# Patient Record
Sex: Female | Born: 1955
Health system: Southern US, Community
[De-identification: ages and names within clinical notes are randomized; demographics above are authoritative.]

## PROBLEM LIST (undated history)

## (undated) DIAGNOSIS — J0101 Acute recurrent maxillary sinusitis: Principal | ICD-10-CM

## (undated) DIAGNOSIS — C880 Waldenstrom macroglobulinemia: Secondary | ICD-10-CM

## (undated) DIAGNOSIS — D801 Nonfamilial hypogammaglobulinemia: Secondary | ICD-10-CM

## (undated) HISTORY — DX: Nonfamilial hypogammaglobulinemia: D80.1

## (undated) HISTORY — DX: Acute recurrent maxillary sinusitis: J01.01

## (undated) HISTORY — DX: Waldenstrom macroglobulinemia: C88.0

---

## 1998-12-15 ENCOUNTER — Other Ambulatory Visit: Admission: RE | Admit: 1998-12-15 | Discharge: 1998-12-15 | Payer: Self-pay | Admitting: *Deleted

## 1999-01-26 ENCOUNTER — Encounter: Payer: Self-pay | Admitting: *Deleted

## 1999-01-26 ENCOUNTER — Ambulatory Visit (HOSPITAL_COMMUNITY): Admission: RE | Admit: 1999-01-26 | Discharge: 1999-01-26 | Payer: Self-pay | Admitting: *Deleted

## 2002-06-26 ENCOUNTER — Other Ambulatory Visit: Admission: RE | Admit: 2002-06-26 | Discharge: 2002-06-26 | Payer: Self-pay | Admitting: Obstetrics and Gynecology

## 2003-08-05 ENCOUNTER — Other Ambulatory Visit: Admission: RE | Admit: 2003-08-05 | Discharge: 2003-08-05 | Payer: Self-pay | Admitting: *Deleted

## 2012-02-07 ENCOUNTER — Encounter: Payer: Self-pay | Admitting: Family Medicine

## 2012-02-07 ENCOUNTER — Ambulatory Visit (INDEPENDENT_AMBULATORY_CARE_PROVIDER_SITE_OTHER): Payer: 59 | Admitting: Family Medicine

## 2012-02-07 ENCOUNTER — Telehealth: Payer: Self-pay | Admitting: Family Medicine

## 2012-02-07 VITALS — BP 110/75 | HR 109 | Temp 97.8°F | Ht 60.0 in | Wt 104.0 lb

## 2012-02-07 DIAGNOSIS — B9689 Other specified bacterial agents as the cause of diseases classified elsewhere: Secondary | ICD-10-CM

## 2012-02-07 DIAGNOSIS — J329 Chronic sinusitis, unspecified: Secondary | ICD-10-CM

## 2012-02-07 DIAGNOSIS — C8800 Waldenstrom macroglobulinemia not having achieved remission: Secondary | ICD-10-CM | POA: Insufficient documentation

## 2012-02-07 DIAGNOSIS — D649 Anemia, unspecified: Secondary | ICD-10-CM

## 2012-02-07 DIAGNOSIS — A499 Bacterial infection, unspecified: Secondary | ICD-10-CM

## 2012-02-07 HISTORY — DX: Waldenstrom macroglobulinemia not having achieved remission: C88.00

## 2012-02-07 HISTORY — DX: Waldenstrom macroglobulinemia: C88.0

## 2012-02-07 LAB — POCT HEMOGLOBIN: Hemoglobin: 9.2 g/dL — AB (ref 12.2–16.2)

## 2012-02-07 MED ORDER — AMOXICILLIN-POT CLAVULANATE 500-125 MG PO TABS: ORAL_TABLET | ORAL | Status: DC

## 2012-02-07 NOTE — Progress Notes (Signed)
CC: Kristy [name] is a 56 y.o. female is here for Establish Care, Cough and check Hgb   Subjective: HPI:  Patient reports over 1 week of mucus sensation draining down the back of her throat and facial pressure underneath her eyes. It was getting better without intervention earlier this week but 2 days ago seemed to take an abrupt turn for the worse. Pressure is worsening, volume of mucus is worsening and has become discolored. She had some chills yesterday but no fevers nor night sweats. She's feeling somewhat short of breath when walking. She's had a dry cough but denies any chest pain or back pain. She denies confusion, easy bruisability, bleeding, orthopnea, peripheral edema sore throat, ear pain.  She's had some tightness in her chest but is not particularly related to exertion, the tightness is located on her clavicles, there has been no wheezing.  She has a history of Waldenstrom's and sees a Designer, multimedia in [facility] every 6 months. She's had one week of chemotherapy years ago but is not on any active medication regimen.  Review Of Systems Outlined In HPI  Past Medical History  Diagnosis Date  . Waldenstrom macroglobulinemia 02/07/2012     History reviewed. No pertinent family history.   History  Substance Use Topics  . Smoking status: Never Smoker   . Smokeless tobacco: Not on file  . Alcohol Use: No     Objective: Filed Vitals:   02/07/12 0847  BP: 110/75  Pulse: 109  Temp: 97.8 F (36.6 C)    General: Alert and Oriented, No Acute Distress HEENT: Pupils equal, round, reactive to light. Conjunctivae clear.  External ears unremarkable, canals clear with intact TMs with appropriate landmarks.  Middle ear appears open without effusion. Pink inferior turbinates.  Moist mucous membranes, pharynx without inflammation nor lesions.  Neck supple without palpable lymphadenopathy nor abnormal masses. No supraclavicular lymphadenopathy.  Maxillary sinus and is  bilaterally Lungs: Clear to auscultation bilaterally, no wheezing/ronchi/rales.  Comfortable work of breathing. Good air movement. Cardiac: Regular rate and rhythm. Normal S1/S2.  No murmurs, rubs, nor gallops.   Extremities: No peripheral edema.  Strong peripheral pulses.  Mental Status: No depression, anxiety, nor agitation.  Assessment & Plan: Kristy [name] was seen today for establish care, cough and check hgb.  Diagnoses and associated orders for this visit:  Bacterial sinusitis - amoxicillin-clavulanate (AUGMENTIN) 500-125 MG per tablet; Take one by mouth every 8 hours for ten total days.  Anemia - POCT hemoglobin  Other Orders - Cyanocobalamin (VITAMIN B-12 IJ); Inject as directed.    She certainly seems to have sinusitis, given the rebound of her symptoms like her to start Augmentin. Her lungs sound clear, pulmonary pathology seems unlikely at this time. I believe her shortness of breath is related to her anemia with a hemoglobin of 9.2 relative to her self-reported baseline of 11-12. She declines investigation today, she'll be contacting her hematologist this morning for further management.  Encouraged use of Mucinex products, decongestant, and nasal saline washes  Return if symptoms worsen or fail to improve.

## 2012-02-07 NOTE — Telephone Encounter (Signed)
Faxed hemoglobin results to number below

## 2012-02-07 NOTE — Telephone Encounter (Signed)
Patient called request to have Hemoglobin results to [facility] Attn: Dr. Allison Quarry fax number [phone]. Thanks

## 2012-06-27 ENCOUNTER — Telehealth: Payer: Self-pay | Admitting: *Deleted

## 2012-06-27 NOTE — Telephone Encounter (Signed)
Tried to call patient 3 times and message states mailbox is full. Barry Dienes, LPN

## 2012-06-27 NOTE — Telephone Encounter (Signed)
Pt notified of PA instructions. Kimberly Gordon, LPN  

## 2012-06-27 NOTE — Telephone Encounter (Signed)
Wash cut good with antibacterial soap and use hydrogen peroxide one more time. Consider soaking in epson salt for 10-15 minutes also. Can apply a triple antibotic ointment on it but don't cover it. Leave it to the air unless need to wear socks or closed toed shoes then place a bandage on while in the shoe. Watch for signs of infection. Redness, swelling, drainage etc. If you see these then schedule appt go to UC for antibiotic.

## 2012-06-27 NOTE — Telephone Encounter (Signed)
Patient calls and states that her cats clawed her left great toe between the nail and skin- bled and hurts. Poured peroxide on it to clean it and doesn't want it to get infected. Please advise

## 2013-04-05 ENCOUNTER — Emergency Department
Admission: EM | Admit: 2013-04-05 | Discharge: 2013-04-05 | Disposition: A | Discharge status: Home / Self Care | Payer: 59 | Source: Home / Self Care

## 2013-04-05 ENCOUNTER — Encounter: Payer: Self-pay | Admitting: Emergency Medicine

## 2013-04-05 DIAGNOSIS — H6691 Otitis media, unspecified, right ear: Secondary | ICD-10-CM

## 2013-04-05 DIAGNOSIS — H6690 Otitis media, unspecified, unspecified ear: Secondary | ICD-10-CM

## 2013-04-05 NOTE — ED Notes (Signed)
Syra complains of right ear pain for 1 day. Denies fever, chills or sweats.

## 2013-04-05 NOTE — ED Provider Notes (Signed)
CSN: [id number]     Arrival date & time 04/05/13  1431 History   None    Chief Complaint  Patient presents with  . Otalgia    x 1 day   (Consider location/radiation/quality/duration/timing/severity/associated sxs/prior Treatment) HPI  URI HISTORY  Kristy [name] is a 58 y.o. female who complains of onset of cold symptoms for several days, and severe worsening right ear pain for 1 day.  Have been using over-the-counter treatment which helps a little bit.  No chills/sweats +  Low-grade Fever  +  Nasal congestion +  Mild Discolored Post-nasal drainage No sinus pain/pressure No sore throat  No cough No wheezing No chest congestion No hemoptysis No shortness of breath No pleuritic pain  No itchy/red eyes No earache  No nausea No vomiting No abdominal pain No diarrhea  No skin rashes +  Fatigue No myalgias Mild headache, no focal neurologic symptoms or visual change   Past Medical History  Diagnosis Date  . Waldenstrom macroglobulinemia 02/07/2012   History reviewed. No pertinent past surgical history. History reviewed. No pertinent family history. History  Substance Use Topics  . Smoking status: Never Smoker   . Smokeless tobacco: Not on file  . Alcohol Use: No   OB History   Grav Para Term Preterm Abortions TAB SAB Ect Mult Living                 Review of Systems  All other systems reviewed and are negative.    Allergies  Review of patient's allergies indicates no known allergies.  Home Medications   Current Outpatient Rx  Name  Route  Sig  Dispense  Refill  . Cyanocobalamin (VITAMIN B-12 IJ)   Injection   Inject as directed.          BP 103/68  Pulse 80  Temp(Src) 97.6 F (36.4 C) (Oral)  Ht 5' (1.524 m)  Wt 111 lb (50.349 kg)  BMI 21.68 kg/m2  SpO2 97% Physical Exam  Nursing note and vitals reviewed. Constitutional: She is oriented to person, place, and time. She appears well-developed and well-nourished. No distress.  HENT:  Head:  Normocephalic and atraumatic.  Right Ear: External ear and ear canal normal. Tympanic membrane is injected and erythematous.  Left Ear: Tympanic membrane, external ear and ear canal normal.  Nose: Mucosal edema and rhinorrhea present.  Mouth/Throat: Oropharynx is clear and moist. No oral lesions.  Eyes: Conjunctivae are normal. No scleral icterus.  Neck: Neck supple.  Cardiovascular: Normal rate, regular rhythm and normal heart sounds.   Pulmonary/Chest: Effort normal and breath sounds normal.  Lymphadenopathy:    She has no cervical adenopathy.  Neurological: She is alert and oriented to person, place, and time.  Skin: Skin is warm and dry.   No cranial tenderness or deformity. No tenderness of the external ear or mastoid area. ED Course  Procedures (including critical care time) Labs Review Labs Reviewed - No data to display Imaging Review No results found.  EKG Interpretation    Date/Time:    Ventricular Rate:    PR Interval:    QRS Duration:   QT Interval:    QTC Calculation:   R Axis:     Text Interpretation:              MDM   1. Acute otitis media, right    Treatment options discussed, as well as risks, benefits, alternatives. Patient voiced understanding and agreement with the following plans: Written prescriptions: Amoxicillin 875 twice a day x10  days Flonase one or 2 sprays each nostril twice a day Tylenol or ibuprofen when necessary moderate pain. Prescription for Vicodin 5/325 written. #12. No refills. 1 by mouth every 4-6 hours when necessary severe pain Follow up with your primary care physician or specialist if not improving, having worsening of symptoms, or new severe symptoms.  Precautions discussed. Red flags discussed. Questions invited and answered. Patient voiced understanding and agreement.      Jacqulyn Cane, MD 04/05/13 2041

## 2014-01-23 ENCOUNTER — Encounter: Payer: Self-pay | Admitting: Emergency Medicine

## 2014-01-23 ENCOUNTER — Emergency Department
Admission: EM | Admit: 2014-01-23 | Discharge: 2014-01-23 | Disposition: A | Discharge status: Home / Self Care | Payer: 59 | Source: Home / Self Care | Attending: Family Medicine | Admitting: Family Medicine

## 2014-01-23 DIAGNOSIS — C880 Waldenstrom macroglobulinemia: Secondary | ICD-10-CM

## 2014-01-23 DIAGNOSIS — R0989 Other specified symptoms and signs involving the circulatory and respiratory systems: Secondary | ICD-10-CM

## 2014-01-23 DIAGNOSIS — R682 Dry mouth, unspecified: Secondary | ICD-10-CM

## 2014-01-23 DIAGNOSIS — F458 Other somatoform disorders: Secondary | ICD-10-CM

## 2014-01-23 DIAGNOSIS — D539 Nutritional anemia, unspecified: Secondary | ICD-10-CM

## 2014-01-23 LAB — POCT CBC W AUTO DIFF (K'VILLE URGENT CARE)

## 2014-01-23 MED ORDER — OMEPRAZOLE 40 MG PO CPDR: 40.0000 mg | DELAYED_RELEASE_CAPSULE | Freq: Every day | ORAL | Status: DC

## 2014-01-23 NOTE — ED Provider Notes (Signed)
Kristy [name] is a 58 y.o. female who presents to Urgent Care today for Metallic taste. Patient has a dry metallic taste in her mouth associated with discolored tongue and a sensation that something is stuck in her throat present for several weeks. She is able to swallow normally. She has a history of Waldenstrom macroglobulinemia and would like her hemoglobin checked today if possible. He previously was 8.7. Additionally she has been treated for B-12 deficiency. No fevers or chills nausea vomiting or diarrhea. No abdominal pain acid reflux symptoms burning in throat.   Past Medical History  Diagnosis Date  . Waldenstrom macroglobulinemia 02/07/2012   History reviewed. No pertinent past surgical history. History  Substance Use Topics  . Smoking status: Never Smoker   . Smokeless tobacco: Not on file  . Alcohol Use: No   ROS as above Medications: No current facility-administered medications for this encounter.   Current Outpatient Prescriptions  Medication Sig Dispense Refill  . Cyanocobalamin (VITAMIN B-12 IJ) Inject as directed.    Marland Kitchen omeprazole (PRILOSEC) 40 MG capsule Take 1 capsule (40 mg total) by mouth daily. 30 capsule 1   Not on File   Exam:  BP 106/69 mmHg  Pulse 104  Temp(Src) 98.5 F (36.9 C) (Oral)  Ht 5' (1.524 m)  Wt 107 lb (48.535 kg)  BMI 20.90 kg/m2  SpO2 98% Gen: Well NAD HEENT: EOMI,  MMM normal-appearing tongue and posterior pharynx Lungs: Normal work of breathing. CTABL Heart: mild tachycardia but regular no MRG Abd: NABS, Soft. Nondistended, Nontender Exts: Brisk capillary refill, warm and well perfused.   CBC significant for white blood cell count 3.6, RBC 2.37, hemoglobin 8.6, MCV 113.6, platelets 125  Results for orders placed or performed during the hospital encounter of 01/23/14 (from the past 24 hour(s))  POCT CBC w auto diff ([place])     Status: Abnormal   Collection Time: 01/23/14  2:58 PM  Result Value Ref Range   WBC  4.5 -  10.5 K/uL   Lymphocytes relative %  15 - 45 %   Monocytes relative %  2 - 10 %   Neutrophils relative % (GR)  44 - 76 %   Lymphocytes absolute  0.1 - 1.8 K/uL   Monocyes absolute  0.1 - 1 K/uL   Neutrophils absolute (GR#)  1.7 - 7.8 K/uL   RBC  3.8 - 5.1 MIL/uL   Hemoglobin  11.8 - 15.5 g/dL   Hematocrit  34.8 - 46 %   MCV  78 - 100 fL   MCH  26 - 32 pg   MCHC  32 - 36.5 g/dL   RDW  11.6 - 14 %   Platelet count  140 - 400 K/uL   MPV  7.8 - 11 fL   No results found.  Assessment and Plan: 58 y.o. female with  1) globus sensation with sour taste in mouth. Likely related to acid reflux. Treatment with omeprazole. 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 Follow-up with hematology  Discussed warning signs or symptoms. Please see discharge instructions. Patient expresses understanding.     Gregor Hams, MD 01/23/14 1537

## 2014-01-23 NOTE — ED Notes (Signed)
Tongue has a film on it, denies pain, burning also reports strange taste in mouth x 2 weeks. Sinus drainage and headache x 3 weeks

## 2014-01-23 NOTE — Discharge Instructions (Signed)
Thank you for coming in today. Try sugar free sour candy.  Take omeprazole daily for 1 month.  Follow up with your doctor soon.  If your belly pain worsens, or you have high fever, bad vomiting, blood in your stool or black tarry stool go to the Emergency Room.   Globus Syndrome Globus Syndrome is a feeling of a lump or a sensation of something caught in your throat. Eating food or drinking fluids does not seem to get rid of it. Yet it is not noticeable during the actual act of swallowing food or liquids. Usually there is nothing physically wrong. It is troublesome because it is an unpleasant sensation which is sometimes difficult to ignore and at times may seem to worsen. The syndrome is quite common. It is estimated 45% of the population experiences features of the condition at some stage during their lives. The symptoms are usually temporary. The largest group of people who feel the need to seek medical treatment is females between the ages of 3 to 65.  CAUSES  Globus Syndrome appears to be triggered by or aggravated by stress, anxiety and depression.  Tension related to stress could product abnormal muscle spasms in the esophagus which would account for the sensation of a lump or ball in your throat.  Frequent swallowing or drying of the throat caused by anxiety or other strong emotions can also produce this uncomfortable sensation in your throat.  Fear and sadness can be expressed by the body in many ways. For instance, if you had a relative with throat cancer you might become overly concerned about your own health and develop uncomfortable sensations in your throat.  The reaction to a crisis or a trauma event in your life can take the form of a lump in your throat. It is as if you are indirectly saying you can not handle or "swallow" one more thing. DIAGNOSIS  Usually your caregiver will know what is wrong by talking to you and examining you. If the condition persists for several days,  more testing may be done to make sure there is not another problem present. This is usually not the case. TREATMENT   Reassurance is often the best treatment available. Usually the problem leaves without treatment over several days.  Sometimes anti-anxiety medications may be prescribed.  Counseling or talk therapy can also help with strong underlying emotions.  Note that in most cases this is not something that keeps coming back and you should not be concerned or worried. Document Released: 05/27/2003 Document Revised: 05/29/2011 Document Reviewed: 10/24/2007 [facility] Patient Information 2015 [place], [place]. This information is not intended to replace advice given to you by your health care provider. Make sure you discuss any questions you have with your health care provider.   Vitamin B12 Deficiency Not having enough vitamin B12 is called a deficiency. Vitamin B12 is an important vitamin. Your body needs vitamin B12 to:   Make red blood cells.  Make DNA. This is the genetic material inside all of your cells.  Help your nerves work properly so they can carry messages from your brain to your body. CAUSES  Not eating enough foods that contain vitamin B12.  Not having enough stomach acid and digestive juices. The body needs these to absorb vitamin B12 from the food you eat.  Having certain digestive system diseases that make it hard to absorb vitamin B12. These diseases include Crohn's disease, chronic pancreatitis, and cystic fibrosis.  Having pernicious anemia, which is a condition where  the body has too few red blood cells. People with this condition do not make enough of a protein called "intrinsic factor," which is needed to absorb vitamin B12.  Having a surgery in which part of the stomach or small intestine is removed.  Taking certain medicines that make it hard for the body to absorb vitamin B12. These medicines include:  Heartburn medicine (antacids and proton pump  inhibitors).  A certain antibiotic medicine called neomycin, which fights infection.  Some medicines used to treat diabetes, tuberculosis, gout, and high cholesterol. RISK FACTORS Risk factors are things that make you more likely to develop a vitamin B12 deficiency. They include:  Being older than 69.  Being a vegetarian.  Being pregnant and a vegetarian or having a poor diet.  Taking certain drugs.  Being an alcoholic. SYMPTOMS You may have a vitamin B12 deficiency with no symptoms. However, a vitamin B12 deficiency can cause health problems like anemia and nerve damage. These health problems can lead to many possible symptoms, including:  Weakness.  Fatigue.  Loss of appetite.  Weight loss.  Numbness or tingling in your hands and feet.  Redness and burning of the tongue.  Confusion or memory problems.  Depression.  Dizziness.  Sensory problems, such as loss of taste, color blindness, and ringing in the ears.  Diarrhea or constipation.  Trouble walking. DIAGNOSIS Various types of tests can be given to help find the cause of your vitamin B12 deficiency. These tests include:  A complete blood count (CBC). This test gives your caregiver an overall picture of what makes up your blood.  A blood test to measure your B12 level.  A blood test to measure intrinsic factor.  An endoscopy. This procedure uses a thin tube with a camera on the end to look into your stomach or intestines. TREATMENT Treatment for vitamin B12 deficiency depends on what is causing it. Common options include:  Changing your eating and drinking habits, such as:  Eating more foods that contain vitamin B12.  Not drinking as much alcohol or any alcohol.  Taking vitamin B12 supplements. Your caregiver will tell you what dose is best for you.  Getting vitamin B12 injections. Some people get these a few times a week. Others get them once a month. HOME CARE INSTRUCTIONS  Take all supplements  as directed by your caregiver. Follow the directions carefully.  Get any injections your caregiver prescribes. Do not miss your appointments.  Eat lots of healthy foods that contain vitamin B12. Ask your caregiver if you should work with a nutritionist. Good things to include in your diet are:  Meat.  Poultry.  Fish.  Eggs.  Fortified cereal and dairy products. This means vitamin B12 has been added to the food. Check the label on the package to be sure.  Do not abuse alcohol.  Keep all follow-up appointments. Your caregiver will need to perform blood tests to make sure your vitamin B12 deficiency is going away. SEEK MEDICAL CARE IF:  You have any questions about your treatment.  Your symptoms come back. MAKE SURE YOU:  Understand these instructions.  Will watch your condition.  Will get help right away if you are not doing well or get worse. Document Released: 05/29/2011 Document Reviewed: 05/29/2011 [facility] Patient Information 2015 [place]. This information is not intended to replace advice given to you by your health care provider. Make sure you discuss any questions you have with your health care provider.

## 2014-02-04 ENCOUNTER — Ambulatory Visit: Payer: 59 | Admitting: Family Medicine

## 2014-02-04 DIAGNOSIS — D890 Polyclonal hypergammaglobulinemia: Secondary | ICD-10-CM | POA: Insufficient documentation

## 2014-10-30 ENCOUNTER — Ambulatory Visit (HOSPITAL_BASED_OUTPATIENT_CLINIC_OR_DEPARTMENT_OTHER): Payer: 59

## 2014-10-30 ENCOUNTER — Ambulatory Visit (HOSPITAL_BASED_OUTPATIENT_CLINIC_OR_DEPARTMENT_OTHER): Payer: 59 | Admitting: Hematology & Oncology

## 2014-10-30 ENCOUNTER — Ambulatory Visit: Payer: 59

## 2014-10-30 ENCOUNTER — Encounter: Payer: Self-pay | Admitting: Hematology & Oncology

## 2014-10-30 VITALS — BP 104/66 | HR 79 | Temp 98.2°F | Resp 16 | Ht 60.0 in | Wt 112.0 lb

## 2014-10-30 DIAGNOSIS — Z1231 Encounter for screening mammogram for malignant neoplasm of breast: Secondary | ICD-10-CM | POA: Diagnosis not present

## 2014-10-30 DIAGNOSIS — C880 Waldenstrom macroglobulinemia: Secondary | ICD-10-CM

## 2014-10-30 LAB — CBC WITH DIFFERENTIAL (CANCER CENTER ONLY)
BASO#: 0 10*3/uL (ref 0.0–0.2)
BASO%: 0.4 % (ref 0.0–2.0)
EOS ABS: 0.1 10*3/uL (ref 0.0–0.5)
EOS%: 1.6 % (ref 0.0–7.0)
HCT: 35.2 % (ref 34.8–46.6)
HEMOGLOBIN: 11.9 g/dL (ref 11.6–15.9)
LYMPH#: 1.3 10*3/uL (ref 0.9–3.3)
LYMPH%: 26.9 % (ref 14.0–48.0)
MCH: 36.5 pg — ABNORMAL HIGH (ref 26.0–34.0)
MCHC: 33.8 g/dL (ref 32.0–36.0)
MCV: 108 fL — AB (ref 81–101)
MONO#: 0.4 10*3/uL (ref 0.1–0.9)
MONO%: 7.7 % (ref 0.0–13.0)
NEUT%: 63.4 % (ref 39.6–80.0)
NEUTROS ABS: 3.1 10*3/uL (ref 1.5–6.5)
PLATELETS: 98 10*3/uL — AB (ref 145–400)
RBC: 3.26 10*6/uL — AB (ref 3.70–5.32)
RDW: 12.6 % (ref 11.1–15.7)
WBC: 4.9 10*3/uL (ref 3.9–10.0)

## 2014-10-30 LAB — COMPREHENSIVE METABOLIC PANEL (CC13)
ALT: 21 U/L (ref 0–55)
AST: 20 U/L (ref 5–34)
Albumin: 4 g/dL (ref 3.5–5.0)
Alkaline Phosphatase: 56 U/L (ref 40–150)
Anion Gap: 9 mEq/L (ref 3–11)
BILIRUBIN TOTAL: 0.71 mg/dL (ref 0.20–1.20)
BUN: 7.7 mg/dL (ref 7.0–26.0)
CO2: 26 mEq/L (ref 22–29)
Calcium: 9.1 mg/dL (ref 8.4–10.4)
Chloride: 107 mEq/L (ref 98–109)
Creatinine: 0.8 mg/dL (ref 0.6–1.1)
EGFR: 87 mL/min/{1.73_m2} — ABNORMAL LOW (ref >90)
Glucose: 94 mg/dl (ref 70–140)
Potassium: 4 mEq/L (ref 3.5–5.1)
SODIUM: 142 meq/L (ref 136–145)
TOTAL PROTEIN: 6.7 g/dL (ref 6.4–8.3)

## 2014-10-30 LAB — CHCC SATELLITE - SMEAR

## 2014-10-30 LAB — TECHNOLOGIST REVIEW CHCC SATELLITE

## 2014-10-30 NOTE — Progress Notes (Signed)
Referral MD  Reason for Referral: Relapsed Waldenstrm's macroglobulinemia   Chief Complaint  Patient presents with  . OTHER  : I'm here because I'm looking for a new physician.  HPI: Ms. Almanzar is a very charming 59 year old white female. She was diagnosed with Waldenstrm's macroglobulinemia back in 2004. She was recently treated with 2-CDA/Rituxan. She got into remission. She apparently was in remission for about 4 or 5 years. She then was found to be anemic. Her IgM level began to rise. It was felt that she had recurrence. She required a blood transfusion.  She decided to be treated just with Rituxan. As such, she received Rituxan. She did well with this.  She currently has been started on Imbruvica. This was started back in 2015. She did require plasmapheresis as her IgM level went up to 6600. Her positive viscosity was over 5. She felt better. She was then started on Imbruvica in November 2015.  She has responded well to the [place]. Her IgM level has acing normalize around 2100.  Her husband is a Marine scientist up on 6 [address]. at [facility] [employer]. He was talking with one of our staff members and felt that his wife would do well in our clinic and it was very convenient for her since they live in [name].  She currently is taking Imbruvica at 280 mg a day because she is concerned about running out before her husband is doing insurance kicks in. He is going to leave the hospital and work for the [place] system in [place]. I told her that I thought that for temporary reasons, the 280 mg dose of Imbruvica should be okay.  She has no visual issues. She has no fatigue. There is no nausea or vomiting. She's had no change in bowel or bladder habits.  She is still working. She works for the [employer]. 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  She does not smoke. She does not drink. She is a vegan. Because of this, she requires vitamin B-12 every 3 months.  She's had no rashes.  She's had no change in bowel or bladder habits. She has not had a mammogram for about 12 years. We will have to work on this.  I don't think she has had a pelvic exam. She does have a family doctor.  Overall, her performance status is ECOG 1.    Past Medical History  Diagnosis Date  . Waldenstrom macroglobulinemia 02/07/2012  :  No past surgical history on file.:   Current outpatient prescriptions:  .  Cyanocobalamin (VITAMIN B-12 IJ), Inject as directed., Disp: , Rfl:  .  UNABLE TO FIND, Imbruvica inj q 3 months, Disp: , Rfl: :  :  Not on File:  No family history on file.:  Social History   Social History  . Marital Status: Married    Spouse Name: N/A  . Number of Children: N/A  . Years of Education: N/A   Occupational History  . Not on file.   Social History Main Topics  . Smoking status: Never Smoker   . Smokeless tobacco: Not on file  . Alcohol Use: No  . Drug Use: No  . Sexual Activity: Yes   Other Topics Concern  . Not on file   Social History Narrative  :  Pertinent items are noted in HPI.  Exam: @IPVITALS @  [place] white female in no obvious distress. Vital signs are temperature of 98.2. Pulse 79. Blood pressure 104/66. Weight is 112 pounds. Head  and neck exam shows no ocular or oral lesions. She has no scleral icterus. She has no adenopathy in the neck. She has no palpable thyroid. Lungs are clear bilaterally. Cardiac exam regular rate and rhythm with a normal S1 and S2. She has 1/6 systolic ejection murmur. Abdomen is soft. She has good bowel sounds. There is no fluid wave. There is no palpable liver or spleen tip. Back exam shows no tenderness over the spine, ribs or hips. Extremities shows no clubbing, cyanosis or edema. She has good range of motion of her joints. She has good strength in her extremities. Skin exam shows no rashes, ecchymoses or petechia. Neurological exam shows no focal neurological deficits.    Recent Labs  10/30/14 1038  WBC  4.9  HGB 11.9  HCT 35.2  PLT 98*   No results for input(s): NA, K, CL, CO2, GLUCOSE, BUN, CREATININE, CALCIUM in the last 72 hours.  Blood smear review: Normochromic and normocytic red blood cells. She has no rouleau formation. I see no nucleated red cells. She has no schistocytes. There are no target cells. White cells be normal in morphology maturation. She has no immature myeloid or lymphoid forms. She has no hypersegmented polys. I see no plasmid-type cells. Platelets are slightly decreased in number. Platelets are well granulated.  Pathology: None     Assessment and Plan: Ms. Oleksy is a very charming 59 year old white female. She is a long history of Waldenstrm's macroglobulinemia. She's done incredibly well. She's had relapses. She currently is on Imbruvica which I think should be able to keep her in remission or at least in a low state of disease for quite a while.  I don't see that we have to do anything different for right now. She clearly does not need any kind of bone marrow test. Need for any x-rays.  She has not had a mammogram in 12 years. I really think that a mammogram is necessary. We will have to get this set up for her.  We will plan to get her back to see [place] in a month. I think if everything looks stable, then we can probably try to move her appointments out longer and longer.  I spent about 45 minutes with her today. We gave her a prayer blanket which she very much appreciated.

## 2014-11-04 LAB — IGG, IGA, IGM
IGM, SERUM: 1570 mg/dL — AB (ref 52–322)
IgA: 27 mg/dL — ABNORMAL LOW (ref 69–380)
IgG (Immunoglobin G), Serum: 199 mg/dL — ABNORMAL LOW (ref 690–1700)

## 2014-11-04 LAB — PROTEIN ELECTROPHORESIS, SERUM, WITH REFLEX
ABNORMAL PROTEIN BAND1: 0.9 g/dL
ALBUMIN ELP: 4.2 g/dL (ref 3.8–4.8)
Alpha-1-Globulin: 0.3 g/dL (ref 0.2–0.3)
Alpha-2-Globulin: 0.5 g/dL (ref 0.5–0.9)
BETA 2: 0.2 g/dL (ref 0.2–0.5)
Beta Globulin: 0.4 g/dL (ref 0.4–0.6)
Gamma Globulin: 1.2 g/dL (ref 0.8–1.7)
Total Protein, Serum Electrophoresis: 6.7 g/dL (ref 6.1–8.1)

## 2014-11-04 LAB — KAPPA/LAMBDA LIGHT CHAINS
KAPPA FREE LGHT CHN: 22.6 mg/dL — AB (ref 0.33–1.94)
KAPPA LAMBDA RATIO: 125.56 — AB (ref 0.26–1.65)
Lambda Free Lght Chn: 0.18 mg/dL — ABNORMAL LOW (ref 0.57–2.63)

## 2014-11-04 LAB — RETICULOCYTES (CHCC)
ABS Retic: 98.3 10*3/uL (ref 19.0–186.0)
RBC.: 3.39 MIL/uL — ABNORMAL LOW (ref 3.87–5.11)
Retic Ct Pct: 2.9 % — ABNORMAL HIGH (ref 0.4–2.3)

## 2014-11-04 LAB — IFE INTERPRETATION

## 2014-11-04 NOTE — Addendum Note (Signed)
Addended by: Burney Gauze R on: 11/04/2014 05:25 PM   Modules accepted: Orders

## 2014-12-14 ENCOUNTER — Ambulatory Visit (HOSPITAL_BASED_OUTPATIENT_CLINIC_OR_DEPARTMENT_OTHER): Payer: Federal, State, Local not specified - PPO

## 2014-12-14 ENCOUNTER — Ambulatory Visit (HOSPITAL_BASED_OUTPATIENT_CLINIC_OR_DEPARTMENT_OTHER): Payer: 59

## 2014-12-14 ENCOUNTER — Encounter: Payer: Self-pay | Admitting: Family

## 2014-12-14 ENCOUNTER — Ambulatory Visit (HOSPITAL_BASED_OUTPATIENT_CLINIC_OR_DEPARTMENT_OTHER): Payer: Federal, State, Local not specified - PPO | Admitting: Family

## 2014-12-14 VITALS — BP 121/77 | HR 77 | Temp 97.8°F | Resp 18 | Wt 117.0 lb

## 2014-12-14 DIAGNOSIS — C880 Waldenstrom macroglobulinemia: Secondary | ICD-10-CM | POA: Diagnosis not present

## 2014-12-14 DIAGNOSIS — Z1231 Encounter for screening mammogram for malignant neoplasm of breast: Secondary | ICD-10-CM

## 2014-12-14 LAB — CBC WITH DIFFERENTIAL (CANCER CENTER ONLY)
BASO#: 0 10*3/uL (ref 0.0–0.2)
BASO%: 0.4 % (ref 0.0–2.0)
EOS%: 2 % (ref 0.0–7.0)
Eosinophils Absolute: 0.1 10*3/uL (ref 0.0–0.5)
HEMATOCRIT: 36.3 % (ref 34.8–46.6)
HEMOGLOBIN: 12.1 g/dL (ref 11.6–15.9)
LYMPH#: 1.5 10*3/uL (ref 0.9–3.3)
LYMPH%: 26.2 % (ref 14.0–48.0)
MCH: 35.7 pg — AB (ref 26.0–34.0)
MCHC: 33.3 g/dL (ref 32.0–36.0)
MCV: 107 fL — AB (ref 81–101)
MONO#: 0.4 10*3/uL (ref 0.1–0.9)
MONO%: 7.8 % (ref 0.0–13.0)
NEUT%: 63.6 % (ref 39.6–80.0)
NEUTROS ABS: 3.6 10*3/uL (ref 1.5–6.5)
Platelets: 115 10*3/uL — ABNORMAL LOW (ref 145–400)
RBC: 3.39 10*6/uL — AB (ref 3.70–5.32)
RDW: 12.7 % (ref 11.1–15.7)
WBC: 5.6 10*3/uL (ref 3.9–10.0)

## 2014-12-14 LAB — COMPREHENSIVE METABOLIC PANEL
ALBUMIN: 4.3 g/dL (ref 3.6–5.1)
ALT: 16 U/L (ref 6–29)
AST: 19 U/L (ref 10–35)
Alkaline Phosphatase: 54 U/L (ref 33–130)
BUN: 8 mg/dL (ref 7–25)
CALCIUM: 9.4 mg/dL (ref 8.6–10.4)
CHLORIDE: 104 mmol/L (ref 98–110)
CO2: 27 mmol/L (ref 20–31)
Creatinine, Ser: 0.58 mg/dL (ref 0.50–1.05)
Glucose, Bld: 84 mg/dL (ref 65–99)
POTASSIUM: 3.9 mmol/L (ref 3.5–5.3)
SODIUM: 141 mmol/L (ref 135–146)
TOTAL PROTEIN: 6.5 g/dL (ref 6.1–8.1)
Total Bilirubin: 0.7 mg/dL (ref 0.2–1.2)

## 2014-12-14 LAB — TECHNOLOGIST REVIEW CHCC SATELLITE

## 2014-12-14 MED ORDER — IBRUTINIB 140 MG PO CAPS: 420.0000 mg | ORAL_CAPSULE | Freq: Every day | ORAL | Status: DC

## 2014-12-14 NOTE — Progress Notes (Signed)
Hematology and Oncology Follow Up Visit  Kristy [name] [id number] 13-Feb-1956 59 y.o. 12/14/2014   Principle Diagnosis:  Waldenstrm's macroglobulinemia  Current Therapy:   Imbruvica 420 mg PO daily    Interim History:  Kristy [name] is here today for a follow-up. She is doing well on Imbruvica. She is almost out and requested a refill on her prescription.  She has had no problems with infection. No lymphadenopathy found on assessment.  In August, her IGM level was 1570 mg/dl. We did recheck this today.  She denies fever, chills, n/v, cough, rash, dizziness, SOB, chest pain, palpitations, abdominal pain, changes in bowel of bladder habits. She has not had any diarrhea. She has not noticed any blood in her stool.  No swelling, tenderness, numbness or tingling in her extremities. She did do some heavy lifting and pulled a muscle in her back. The muscle spasms come and go and sometimes bother her at night.  She is eating well and staying hydrated. Her weight is up 5 lbs since her last visit.  She was supposed to have her mammogram today but cancelled because she could not get out of work on time. She rescheduled for in October.  Her husband switched jobs and now she is happy to have insurance through [employer].  She also needs a PCP and asked for a recommendation. I gave her the contact info and names for [facility]. She plans to call and schedule an appointment.   Medications:    Medication List       This list is accurate as of: 12/14/14  3:53 PM.  Always use your most recent med list.               ibrutinib 140 MG capsul  Commonly known as:  IMBRUVICA  Take 3 capsules (420 mg total) by mouth daily.     VITAMIN B-12 IJ  Inject as directed.        Allergies: Not on File  Past Medical History, Surgical history, Social history, and Family History were reviewed and updated.  Review of Systems: All other 10 point review of systems is negative.    Physical Exam:  weight is 117 lb (53.071 kg). Her temperature is 97.8 F (36.6 C). Her blood pressure is 121/77 and her pulse is 77. Her respiration is 18.   Wt Readings from Last 3 Encounters:  12/14/14 117 lb (53.071 kg)  10/30/14 112 lb (50.803 kg)  01/23/14 107 lb (48.535 kg)    Ocular: Sclerae unicteric, pupils equal, round and reactive to light Ear-nose-throat: Oropharynx clear, dentition fair Lymphatic: No cervical or supraclavicular adenopathy Lungs no rales or rhonchi, good excursion bilaterally Heart regular rate and rhythm, no murmur appreciated Abd soft, nontender, positive bowel sounds MSK no focal spinal tenderness, no joint edema Neuro: non-focal, well-oriented, appropriate affect Breasts: Deferred  Lab Results  Component Value Date   WBC 5.6 12/14/2014   HGB 12.1 12/14/2014   HCT 36.3 12/14/2014   MCV 107* 12/14/2014   PLT 115* 12/14/2014   No results found for: FERRITIN, IRON, TIBC, UIBC, IRONPCTSAT Lab Results  Component Value Date   RETICCTPCT 2.9* 10/30/2014   RBC 3.39* 12/14/2014   RETICCTABS 98.3 10/30/2014   Lab Results  Component Value Date   KPAFRELGTCHN 22.60* 10/30/2014   LAMBDASER 0.18* 10/30/2014   KAPLAMBRATIO 125.56* 10/30/2014   Lab Results  Component Value Date   IGGSERUM 199* 10/30/2014   IGA 27* 10/30/2014   IGMSERUM 1570* 10/30/2014  Lab Results  Component Value Date   TOTALPROTELP 6.7 10/30/2014   ALBUMINELP 4.2 10/30/2014   A1GS 0.3 10/30/2014   A2GS 0.5 10/30/2014   BETS 0.4 10/30/2014   BETA2SER 0.2 10/30/2014   GAMS 1.2 10/30/2014   SPEI * 10/30/2014     Chemistry      Component Value Date/Time   NA 142 10/30/2014 1046   K 4.0 10/30/2014 1046   CO2 26 10/30/2014 1046   BUN 7.7 10/30/2014 1046   CREATININE 0.8 10/30/2014 1046      Component Value Date/Time   CALCIUM 9.1 10/30/2014 1046   ALKPHOS 56 10/30/2014 1046   AST 20 10/30/2014 1046   ALT 21 10/30/2014 1046   BILITOT 0.71 10/30/2014 1046      Impression and Plan: Kristy [name] is a very pleasant 59 yo white female with history of Waldenstrm's macroglobulinemia. She has done well despite relapses.  Her IgM in August was 1570 mg/dl. Her lab work today is pending.  She has had no problem while on Imbruvica. She will continue on Imbruvica 420 mg PO daily.  She did rescheduled her mammogram for in October.  She has the contact info for [facility] and plans to establish a PCP for both she and her husband.  We will plan to see her back in 2 months for labs and follow-up.  She knows to contact us with any questions or concerns. We can certainly see her sooner if need be.   Eliezer Bottom, NP 9/26/20163:53 PM

## 2014-12-15 ENCOUNTER — Telehealth: Payer: Self-pay | Admitting: *Deleted

## 2014-12-15 ENCOUNTER — Telehealth: Payer: Self-pay | Admitting: Hematology & Oncology

## 2014-12-15 NOTE — Telephone Encounter (Signed)
Kristy [name], FEP updated the outcome for this PA: Favorable Request Key: [place] PA created on: 12/14/2014   APPROVED   IMBRUVICA

## 2014-12-15 NOTE — Telephone Encounter (Addendum)
Patient aware of results  ----- Message from [name], MD sent at 12/15/2014  1:40 PM EDT ----- Please call and tell her that the light chains are coming down nicely.Laurey Arrow

## 2014-12-16 ENCOUNTER — Telehealth: Payer: Self-pay | Admitting: *Deleted

## 2014-12-16 NOTE — Telephone Encounter (Addendum)
Patient aware of results  ----- Message from [name], MD sent at 12/16/2014  7:07 AM EDT ----- Call - her Waldenstom's is a little better!!! Laurey Arrow

## 2014-12-18 LAB — IFE INTERPRETATION

## 2014-12-21 ENCOUNTER — Ambulatory Visit (HOSPITAL_BASED_OUTPATIENT_CLINIC_OR_DEPARTMENT_OTHER)
Admission: RE | Admit: 2014-12-21 | Discharge: 2014-12-21 | Disposition: A | Discharge status: Home / Self Care | Payer: Federal, State, Local not specified - PPO | Source: Ambulatory Visit | Attending: Hematology & Oncology | Admitting: Hematology & Oncology

## 2014-12-21 DIAGNOSIS — Z1231 Encounter for screening mammogram for malignant neoplasm of breast: Secondary | ICD-10-CM | POA: Insufficient documentation

## 2014-12-21 DIAGNOSIS — C880 Waldenstrom macroglobulinemia: Secondary | ICD-10-CM

## 2014-12-22 LAB — PROTEIN ELECTROPHORESIS, SERUM, WITH REFLEX
ALBUMIN ELP: 4.1 g/dL (ref 3.8–4.8)
Abnormal Protein Band1: 0.9 g/dL
Alpha-1-Globulin: 0.3 g/dL (ref 0.2–0.3)
Alpha-2-Globulin: 0.5 g/dL (ref 0.5–0.9)
BETA GLOBULIN: 0.4 g/dL (ref 0.4–0.6)
Beta 2: 0.2 g/dL (ref 0.2–0.5)
Gamma Globulin: 1.1 g/dL (ref 0.8–1.7)
Total Protein, Serum Electrophoresis: 6.6 g/dL (ref 6.1–8.1)

## 2014-12-22 LAB — KAPPA/LAMBDA LIGHT CHAINS
KAPPA FREE LGHT CHN: 14.9 mg/dL — AB (ref 0.33–1.94)
KAPPA LAMBDA RATIO: 135.45 — AB (ref 0.26–1.65)
Lambda Free Lght Chn: 0.11 mg/dL — ABNORMAL LOW (ref 0.57–2.63)

## 2014-12-22 LAB — IGG, IGA, IGM
IgA: 15 mg/dL — ABNORMAL LOW (ref 69–380)
IgG (Immunoglobin G), Serum: 187 mg/dL — ABNORMAL LOW (ref 690–1700)
IgM, Serum: 1300 mg/dL — ABNORMAL HIGH (ref 52–322)

## 2014-12-22 LAB — VISCOSITY, SERUM: Viscosity, Serum: 1.7 rel to H2O (ref 1.5–1.9)

## 2015-02-15 ENCOUNTER — Telehealth: Payer: Self-pay | Admitting: *Deleted

## 2015-02-15 ENCOUNTER — Encounter: Payer: Self-pay | Admitting: Hematology & Oncology

## 2015-02-15 ENCOUNTER — Ambulatory Visit (HOSPITAL_BASED_OUTPATIENT_CLINIC_OR_DEPARTMENT_OTHER): Payer: Federal, State, Local not specified - PPO | Admitting: Hematology & Oncology

## 2015-02-15 ENCOUNTER — Other Ambulatory Visit (HOSPITAL_BASED_OUTPATIENT_CLINIC_OR_DEPARTMENT_OTHER): Payer: Federal, State, Local not specified - PPO

## 2015-02-15 ENCOUNTER — Ambulatory Visit (HOSPITAL_BASED_OUTPATIENT_CLINIC_OR_DEPARTMENT_OTHER)
Admission: RE | Admit: 2015-02-15 | Discharge: 2015-02-15 | Disposition: A | Discharge status: Home / Self Care | Payer: Federal, State, Local not specified - PPO | Source: Ambulatory Visit | Attending: Hematology & Oncology | Admitting: Hematology & Oncology

## 2015-02-15 VITALS — BP 128/66 | HR 83 | Temp 97.6°F | Resp 18 | Ht 60.0 in | Wt 118.0 lb

## 2015-02-15 DIAGNOSIS — M545 Low back pain: Secondary | ICD-10-CM | POA: Insufficient documentation

## 2015-02-15 DIAGNOSIS — M5441 Lumbago with sciatica, right side: Secondary | ICD-10-CM

## 2015-02-15 DIAGNOSIS — C880 Waldenstrom macroglobulinemia: Secondary | ICD-10-CM

## 2015-02-15 DIAGNOSIS — M5416 Radiculopathy, lumbar region: Secondary | ICD-10-CM | POA: Diagnosis not present

## 2015-02-15 LAB — COMPREHENSIVE METABOLIC PANEL (CC13)
ALT: 14 U/L (ref 0–55)
AST: 20 U/L (ref 5–34)
Albumin: 4 g/dL (ref 3.5–5.0)
Alkaline Phosphatase: 56 U/L (ref 40–150)
Anion Gap: 9 mEq/L (ref 3–11)
BUN: 10.7 mg/dL (ref 7.0–26.0)
CHLORIDE: 107 meq/L (ref 98–109)
CO2: 24 meq/L (ref 22–29)
Calcium: 8.8 mg/dL (ref 8.4–10.4)
Creatinine: 0.7 mg/dL (ref 0.6–1.1)
EGFR: >90 mL/min/{1.73_m2} (ref >90)
GLUCOSE: 85 mg/dL (ref 70–140)
Potassium: 3.8 mEq/L (ref 3.5–5.1)
SODIUM: 140 meq/L (ref 136–145)
TOTAL PROTEIN: 6.9 g/dL (ref 6.4–8.3)
Total Bilirubin: 1.06 mg/dL (ref 0.20–1.20)

## 2015-02-15 LAB — CBC WITH DIFFERENTIAL (CANCER CENTER ONLY)
BASO#: 0 10*3/uL (ref 0.0–0.2)
BASO%: 0.2 % (ref 0.0–2.0)
EOS%: 2.5 % (ref 0.0–7.0)
Eosinophils Absolute: 0.1 10*3/uL (ref 0.0–0.5)
HCT: 36.5 % (ref 34.8–46.6)
HGB: 12.2 g/dL (ref 11.6–15.9)
LYMPH#: 1.4 10*3/uL (ref 0.9–3.3)
LYMPH%: 29 % (ref 14.0–48.0)
MCH: 35.5 pg — ABNORMAL HIGH (ref 26.0–34.0)
MCHC: 33.4 g/dL (ref 32.0–36.0)
MCV: 106 fL — ABNORMAL HIGH (ref 81–101)
MONO#: 0.3 10*3/uL (ref 0.1–0.9)
MONO%: 6.9 % (ref 0.0–13.0)
NEUT#: 2.9 10*3/uL (ref 1.5–6.5)
NEUT%: 61.4 % (ref 39.6–80.0)
Platelets: 111 10*3/uL — ABNORMAL LOW (ref 145–400)
RBC: 3.44 10*6/uL — AB (ref 3.70–5.32)
RDW: 12.8 % (ref 11.1–15.7)
WBC: 4.8 10*3/uL (ref 3.9–10.0)

## 2015-02-15 LAB — TECHNOLOGIST REVIEW CHCC SATELLITE

## 2015-02-15 NOTE — Progress Notes (Signed)
Hematology and Oncology Follow Up Visit  KABAO GLAZER [id number] 1955/10/04 59 y.o. 02/15/2015   Principle Diagnosis:  Waldenstrm's macroglobulinemia  Current Therapy:   Imbruvica 420 mg PO daily    Interim History:  Ms. Cozza is here today for a follow-up. She is doing well on Imbruvica. She has had no problems with cough or shortness of breath. She's had no issues with bowels or bladder.  She did hurt her lower back recently. She is moving some furniture. Because she does not have a family doctor yet, we will go ahead and get her set up with some x-rays. She really does not want to take any medication for this right now.  Her last IgM level was 1300. I think if this continues to improve, we might be able to decrease her Imbruvica dose to 280 mg a day.  She's had a good appetite. She's had a good Thanksgiving. She was with family.  She does not have any plans for Christmas.  She's had no rashes. There she had a mammogram recently and this turned out okay.   Medications:    Medication List       This list is accurate as of: 02/15/15  9:08 AM.  Always use your most recent med list.               ibrutinib 140 MG capsul  Commonly known as:  IMBRUVICA  Take 3 capsules (420 mg total) by mouth daily.     VITAMIN B-12 IJ  Inject as directed.        Allergies: Not on File  Past Medical History, Surgical history, Social history, and Family History were reviewed and updated.  Review of Systems: All other 10 point review of systems is negative.   Physical Exam:  height is 5' (1.524 m) and weight is 118 lb (53.524 kg). Her oral temperature is 97.6 F (36.4 C). Her blood pressure is 128/66 and her pulse is 83. Her respiration is 18.   Wt Readings from Last 3 Encounters:  02/15/15 118 lb (53.524 kg)  12/14/14 117 lb (53.071 kg)  10/30/14 112 lb (50.803 kg)    Well-developed and well-nourished white female who is petite. Head and neck exam shows no ocular  or oral lesions. She has no palpable cervical or supraclavicular lymph nodes. Lungs are clear. Cardiac exam regular rate and rhythm with no murmurs, rubs or bruits. Abdomen is soft. She has good bowel sounds. There is no fluid wave. There is no palpable liver or spleen tip. Back exam shows no tenderness over the spine, ribs or hips. Extremities shows no clubbing, cyanosis or edema. Skin exam shows no rashes, ecchymoses or petechia. Neurological exam shows no focal neurological deficits. Lab Results  Component Value Date   WBC 4.8 02/15/2015   HGB 12.2 02/15/2015   HCT 36.5 02/15/2015   MCV 106* 02/15/2015   PLT 111* 02/15/2015   No results found for: FERRITIN, IRON, TIBC, UIBC, IRONPCTSAT Lab Results  Component Value Date   RETICCTPCT 2.9* 10/30/2014   RBC 3.44* 02/15/2015   RETICCTABS 98.3 10/30/2014   Lab Results  Component Value Date   KPAFRELGTCHN 14.90* 12/14/2014   LAMBDASER 0.11* 12/14/2014   KAPLAMBRATIO 135.45* 12/14/2014   Lab Results  Component Value Date   IGGSERUM 187* 12/14/2014   IGA 15* 12/14/2014   IGMSERUM 1300* 12/14/2014   Lab Results  Component Value Date   TOTALPROTELP 6.6 12/14/2014   ALBUMINELP 4.1 12/14/2014   A1GS 0.3 12/14/2014  A2GS 0.5 12/14/2014   BETS 0.4 12/14/2014   BETA2SER 0.2 12/14/2014   GAMS 1.1 12/14/2014   SPEI * 12/14/2014     Chemistry      Component Value Date/Time   NA 141 12/14/2014 1405   NA 142 10/30/2014 1046   K 3.9 12/14/2014 1405   K 4.0 10/30/2014 1046   CL 104 12/14/2014 1405   CO2 27 12/14/2014 1405   CO2 26 10/30/2014 1046   BUN 8 12/14/2014 1405   BUN 7.7 10/30/2014 1046   CREATININE 0.58 12/14/2014 1405   CREATININE 0.8 10/30/2014 1046      Component Value Date/Time   CALCIUM 9.4 12/14/2014 1405   CALCIUM 9.1 10/30/2014 1046   ALKPHOS 54 12/14/2014 1405   ALKPHOS 56 10/30/2014 1046   AST 19 12/14/2014 1405   AST 20 10/30/2014 1046   ALT 16 12/14/2014 1405   ALT 21 10/30/2014 1046   BILITOT 0.7  12/14/2014 1405   BILITOT 0.71 10/30/2014 1046     Impression and Plan: Ms. Jourdan is a very pleasant 59 yo white female with history of Waldenstrm's macroglobulinemia. She has done well despite relapses.  Her IgM in September was 1300 mg/dl. Her lab work today is pending.    We'll see about some plain x-rays for her lower back. Again, she does not have a family doctor. We may end up having to refer her to an orthopedist if she does not improve.  She does not want take anything for this right now. I told her she can always take Motrin or Aleve as long she takes it with food  I spent about 30 minutes with her today.  I will see her back in 2 months. [name], MD 11/28/20169:08 AM

## 2015-02-15 NOTE — Telephone Encounter (Addendum)
Patient aware of results  ----- Message from [name], MD sent at 02/15/2015 12:01 PM EST ----- Call - xrays look ok!!!  If pain persists, then an MRI is the next test.  pete

## 2015-02-17 LAB — PROTEIN ELECTROPHORESIS, SERUM
ABNORMAL PROTEIN BAND1: 0.7 g/dL
ALPHA-2-GLOBULIN: 0.5 g/dL (ref 0.5–0.9)
Albumin ELP: 4.2 g/dL (ref 3.8–4.8)
Alpha-1-Globulin: 0.3 g/dL (ref 0.2–0.3)
BETA 2: 0.2 g/dL (ref 0.2–0.5)
Beta Globulin: 0.4 g/dL (ref 0.4–0.6)
Gamma Globulin: 1 g/dL (ref 0.8–1.7)
TOTAL PROTEIN, SERUM ELECTROPHOR: 6.5 g/dL (ref 6.1–8.1)

## 2015-02-17 LAB — IGG, IGA, IGM
IGG (IMMUNOGLOBIN G), SERUM: 201 mg/dL — AB (ref 690–1700)
IgA: 17 mg/dL — ABNORMAL LOW (ref 69–380)
IgM, Serum: 1070 mg/dL — ABNORMAL HIGH (ref 52–322)

## 2015-02-17 LAB — KAPPA/LAMBDA LIGHT CHAINS
KAPPA FREE LGHT CHN: 14 mg/dL — AB (ref 0.33–1.94)
Kappa:Lambda Ratio: 73.68 — ABNORMAL HIGH (ref 0.26–1.65)
LAMBDA FREE LGHT CHN: 0.19 mg/dL — AB (ref 0.57–2.63)

## 2015-02-19 ENCOUNTER — Telehealth: Payer: Self-pay | Admitting: *Deleted

## 2015-02-19 NOTE — Telephone Encounter (Signed)
Patient wanted lab results from earlier this week. Reviewed with Dr Marin Olp and he okayed giving results to patient. Patient aware of results.

## 2015-03-01 ENCOUNTER — Telehealth: Payer: Self-pay | Admitting: *Deleted

## 2015-03-01 NOTE — Telephone Encounter (Signed)
Per patient, she had a conversation with Dr Marin Olp about decreasing her Kate Sable to twice a day depending on her lab results. She has not heard back from the office. Incidentally, she's also developed an itchy rash to her thigh which is itchy but not painful. The rash is red bumps, not blistered.   Spoke to Dr Marin Olp who wants patient to decrease dose to two pills/day. He also wants her to try some OTC hydrocortisone cream to the rash to see if it helps with symptoms. She understood.

## 2015-04-02 ENCOUNTER — Other Ambulatory Visit: Payer: Self-pay | Admitting: Family

## 2015-04-02 ENCOUNTER — Emergency Department
Admission: EM | Admit: 2015-04-02 | Discharge: 2015-04-02 | Disposition: A | Discharge status: Home / Self Care | Payer: Federal, State, Local not specified - PPO | Source: Home / Self Care | Attending: Family Medicine | Admitting: Family Medicine

## 2015-04-02 ENCOUNTER — Encounter: Payer: Self-pay | Admitting: *Deleted

## 2015-04-02 DIAGNOSIS — J0100 Acute maxillary sinusitis, unspecified: Secondary | ICD-10-CM

## 2015-04-02 DIAGNOSIS — H9201 Otalgia, right ear: Secondary | ICD-10-CM

## 2015-04-02 MED ORDER — SALINE SPRAY 0.65 % NA SOLN
1.0000 | NASAL | Status: DC | PRN
Start: 2015-04-02 — End: 2015-11-26

## 2015-04-02 MED ORDER — AMOXICILLIN-POT CLAVULANATE 875-125 MG PO TABS: 1.0000 | ORAL_TABLET | Freq: Two times a day (BID) | ORAL | Status: DC

## 2015-04-02 MED FILL — AMOX-CLAV 875-125 MG TABLET: 875-125 | 7 days supply | Qty: 14 | Fill #0

## 2015-04-02 MED FILL — DEEP SEA 0.65% NOSE SPRAY: 0.65 | 30 days supply | Qty: 44 | Fill #0

## 2015-04-02 MED FILL — *IMBRUVICA 140 MG CAPSULE: 140 | 30 days supply | Qty: 90 | Fill #0

## 2015-04-02 NOTE — ED Notes (Signed)
Pt c/o green/yellow nasal congestion x 3 mths, worse x 2 wks. She also c/o RT ear pain x today. Denies fever. No OTC meds.

## 2015-04-02 NOTE — ED Provider Notes (Signed)
CSN: [id number]     Arrival date & time 04/02/15  1358 History   First MD Initiated Contact with Patient 04/02/15 1422     Chief Complaint  Patient presents with  . Nasal Congestion   (Consider location/radiation/quality/duration/timing/severity/associated sxs/prior Treatment) HPI  The pt is a 60yo female presenting to [facility] with c/o 3 months of mild persistent nasal congestion that has gradually worsened over the last 2 weeks becoming a thick yellow-green discharge with associated Right ear pain and sinus pressure with frontal headaches.  Pain is mild to moderate in severity, worse when attempted to blow her nose as mucous is so thick, not much comes out.  She has not taken anything for symptoms as she is on Imbruvica and unsure what she can take.  Denies fever, chills, n/v/d.   Past Medical History  Diagnosis Date  . Waldenstrom macroglobulinemia ([facility]) 02/07/2012   Past Surgical History  Procedure Laterality Date  . Cesarean section     History reviewed. No pertinent family history. Social History  Substance Use Topics  . Smoking status: Never Smoker   . Smokeless tobacco: None  . Alcohol Use: No   OB History    No data available     Review of Systems  Constitutional: Negative for fever and chills.  HENT: Positive for congestion, postnasal drip, rhinorrhea and sinus pressure. Negative for ear pain, sore throat, trouble swallowing and voice change.   Respiratory: Negative for cough and shortness of breath.   Cardiovascular: Negative for chest pain and palpitations.  Gastrointestinal: Negative for nausea, vomiting, abdominal pain and diarrhea.  Musculoskeletal: Negative for myalgias, back pain and arthralgias.  Skin: Negative for rash.  Neurological: Positive for headaches. Negative for dizziness and light-headedness.    Allergies  Review of patient's allergies indicates no known allergies.  Home Medications   Prior to Admission medications   Medication Sig Start Date End  Date Taking? Authorizing Provider  amoxicillin-clavulanate (AUGMENTIN) 875-125 MG tablet Take 1 tablet by mouth 2 (two) times daily. One po bid x 7 days 04/02/15   Noland Fordyce, PA-C  Cyanocobalamin (VITAMIN B-12 IJ) Inject as directed.    Historical Provider, MD  ibrutinib (IMBRUVICA) 140 MG capsul Take 3 capsules (420 mg total) by mouth daily. 12/14/14   Eliezer Bottom, NP  sodium chloride (OCEAN) 0.65 % SOLN nasal spray Place 1 spray into both nostrils as needed. 04/02/15   Noland Fordyce, PA-C   Meds Ordered and Administered this Visit  Medications - No data to display  BP 120/78 mmHg  Pulse 79  Temp(Src) 97.9 F (36.6 C) (Oral)  Resp 16  Ht 5' (1.524 m)  Wt 118 lb (53.524 kg)  BMI 23.05 kg/m2  SpO2 98% No data found.   Physical Exam  Constitutional: She appears well-developed and well-nourished. No distress.  HENT:  Head: Normocephalic and atraumatic.  Right Ear: Hearing, tympanic membrane, external ear and ear canal normal.  Left Ear: Hearing, tympanic membrane, external ear and ear canal normal.  Nose: Mucosal edema present. Right sinus exhibits maxillary sinus tenderness. Right sinus exhibits no frontal sinus tenderness. Left sinus exhibits maxillary sinus tenderness. Left sinus exhibits no frontal sinus tenderness.  Mouth/Throat: Uvula is midline and mucous membranes are normal. Posterior oropharyngeal erythema present. No oropharyngeal exudate, posterior oropharyngeal edema or tonsillar abscesses.  Eyes: Conjunctivae are normal. No scleral icterus.  Neck: Normal range of motion. Neck supple.  Cardiovascular: Normal rate, regular rhythm and normal heart sounds.   Pulmonary/Chest: Effort normal and breath  sounds normal. No stridor. No respiratory distress. She has no wheezes. She has no rales. She exhibits no tenderness.  Abdominal: Soft. She exhibits no distension. There is no tenderness.  Musculoskeletal: Normal range of motion.  Lymphadenopathy:    She has no cervical  adenopathy.  Neurological: She is alert.  Skin: Skin is warm and dry. She is not diaphoretic.  Nursing note and vitals reviewed.   ED Course  Procedures (including critical care time)  Labs Review Labs Reviewed - No data to display  Imaging Review No results found.    MDM   1. Acute maxillary sinusitis, recurrence not specified   2. Right ear pain     Signs and symptoms c/w acute maxillary sinusitis.  Rx: augmentin and nasal saline.   Encouraged pt to stay well hydrated, get plenty of rest, and may take acetaminophen for fever or headache. F/u with PCP in 1 week if not improving, sooner if worsening. Patient verbalized understanding and agreement with treatment plan.    Noland Fordyce, PA-C 04/02/15 1541

## 2015-04-02 NOTE — Discharge Instructions (Signed)
°  You may take 500mg Tylenol every 4-6 hours as needed for fever and pain  °Follow-up with your primary care provider next week for recheck of symptoms if not improving.  °Be sure to drink plenty of fluids and rest, at least 8hrs of sleep a night, preferably more while you are sick. °Return urgent care or go to closest ER if you cannot keep down fluids/signs of dehydration, fever not reducing with Tylenol, difficulty breathing/wheezing, stiff neck, worsening condition, or other concerns (see below)  °Please take antibiotics as prescribed and be sure to complete entire course even if you start to feel better to ensure infection does not come back. ° °

## 2015-04-21 ENCOUNTER — Ambulatory Visit (HOSPITAL_BASED_OUTPATIENT_CLINIC_OR_DEPARTMENT_OTHER): Payer: Federal, State, Local not specified - PPO | Admitting: Hematology & Oncology

## 2015-04-21 ENCOUNTER — Encounter: Payer: Self-pay | Admitting: Hematology & Oncology

## 2015-04-21 ENCOUNTER — Emergency Department (INDEPENDENT_AMBULATORY_CARE_PROVIDER_SITE_OTHER)
Admission: EM | Admit: 2015-04-21 | Discharge: 2015-04-21 | Payer: Federal, State, Local not specified - PPO | Source: Home / Self Care

## 2015-04-21 ENCOUNTER — Other Ambulatory Visit (HOSPITAL_BASED_OUTPATIENT_CLINIC_OR_DEPARTMENT_OTHER): Payer: Federal, State, Local not specified - PPO

## 2015-04-21 ENCOUNTER — Encounter: Payer: Self-pay | Admitting: *Deleted

## 2015-04-21 VITALS — BP 123/78 | HR 88 | Temp 97.8°F | Resp 16 | Ht 60.0 in | Wt 119.0 lb

## 2015-04-21 DIAGNOSIS — R300 Dysuria: Secondary | ICD-10-CM | POA: Diagnosis not present

## 2015-04-21 DIAGNOSIS — M5441 Lumbago with sciatica, right side: Secondary | ICD-10-CM

## 2015-04-21 DIAGNOSIS — C880 Waldenstrom macroglobulinemia: Secondary | ICD-10-CM

## 2015-04-21 LAB — COMPREHENSIVE METABOLIC PANEL
ALT: 21 U/L (ref 0–55)
AST: 21 U/L (ref 5–34)
Albumin: 4.3 g/dL (ref 3.5–5.0)
Alkaline Phosphatase: 60 U/L (ref 40–150)
Anion Gap: 10 mEq/L (ref 3–11)
BUN: 9.2 mg/dL (ref 7.0–26.0)
CHLORIDE: 104 meq/L (ref 98–109)
CO2: 26 meq/L (ref 22–29)
CREATININE: 0.8 mg/dL (ref 0.6–1.1)
Calcium: 8.9 mg/dL (ref 8.4–10.4)
EGFR: 87 mL/min/{1.73_m2} — ABNORMAL LOW (ref >90)
Glucose: 86 mg/dl (ref 70–140)
Potassium: 3.9 mEq/L (ref 3.5–5.1)
Sodium: 141 mEq/L (ref 136–145)
Total Bilirubin: 1.2 mg/dL (ref 0.20–1.20)
Total Protein: 7.1 g/dL (ref 6.4–8.3)

## 2015-04-21 LAB — CBC WITH DIFFERENTIAL (CANCER CENTER ONLY)
BASO#: 0 10*3/uL (ref 0.0–0.2)
BASO%: 0.3 % (ref 0.0–2.0)
EOS%: 1.5 % (ref 0.0–7.0)
Eosinophils Absolute: 0.1 10*3/uL (ref 0.0–0.5)
HCT: 37 % (ref 34.8–46.6)
HGB: 12.4 g/dL (ref 11.6–15.9)
LYMPH#: 1.3 10*3/uL (ref 0.9–3.3)
LYMPH%: 21.8 % (ref 14.0–48.0)
MCH: 35.4 pg — ABNORMAL HIGH (ref 26.0–34.0)
MCHC: 33.5 g/dL (ref 32.0–36.0)
MCV: 106 fL — ABNORMAL HIGH (ref 81–101)
MONO#: 0.4 10*3/uL (ref 0.1–0.9)
MONO%: 7.1 % (ref 0.0–13.0)
NEUT#: 4.2 10*3/uL (ref 1.5–6.5)
NEUT%: 69.3 % (ref 39.6–80.0)
PLATELETS: 96 10*3/uL — AB (ref 145–400)
RBC: 3.5 10*6/uL — ABNORMAL LOW (ref 3.70–5.32)
RDW: 12.6 % (ref 11.1–15.7)
WBC: 6.1 10*3/uL (ref 3.9–10.0)

## 2015-04-21 NOTE — Progress Notes (Signed)
Hematology and Oncology Follow Up Visit  Kristy [name] [id number] [birth year] 60 y.o. 04/21/2015   Principle Diagnosis:  Waldenstrm's macroglobulinemia  Current Therapy:   Imbruvica 280 mg PO daily    Interim History:  Kristy [name] is here today for a follow-up. She is doing quite well. She is now is only taking 2 Imbruvica daily. She's had no problems with this. Patient no problems over the holidays.  Her last IgM level was improving. It was 1070 mg/dL. Her Kappa Light chain was 14 mg/dL.  her SPEP showed an M spike of 0.7 g/dL  She's had no problems with nausea or vomiting. She's had no change in bowel or bladder habits. Patient did have a recent sinus infection. She was on amoxicillin for this.  She's had no rashes. She's had no leg swelling.  Overall, her performance status is ECOG 0.   Medications:    Medication List       This list is accurate as of: 04/21/15  8:25 AM.  Always use your most recent med list.               IMBRUVICA 140 MG capsul  Generic drug:  ibrutinib  TAKE 3 CAPSULES (420 MG TOTAL) BY MOUTH DAILY.     sodium chloride 0.65 % Soln nasal spray  Commonly known as:  OCEAN  Place 1 spray into both nostrils as needed.     VITAMIN B-12 IJ  Inject as directed.        Allergies: No Known Allergies  Past Medical History, Surgical history, Social history, and Family History were reviewed and updated.  Review of Systems: All other 10 point review of systems is negative.   Physical Exam:  height is 5' (1.524 m) and weight is 119 lb (53.978 kg). Her oral temperature is 97.8 F (36.6 C). Her blood pressure is 123/78 and her pulse is 88. Her respiration is 16.   Wt Readings from Last 3 Encounters:  04/21/15 119 lb (53.978 kg)  04/02/15 118 lb (53.524 kg)  02/15/15 118 lb (53.524 kg)    Well-developed and well-nourished white female who is petite. Head and neck exam shows no ocular or oral lesions. She has no palpable cervical or  supraclavicular lymph nodes. Lungs are clear. Cardiac exam regular rate and rhythm with no murmurs, rubs or bruits. Abdomen is soft. She has good bowel sounds. There is no fluid wave. There is no palpable liver or spleen tip. Back exam shows no tenderness over the spine, ribs or hips. Extremities shows no clubbing, cyanosis or edema. Skin exam shows no rashes, ecchymoses or petechia. Neurological exam shows no focal neurological deficits. Lab Results  Component Value Date   WBC 6.1 04/21/2015   HGB 12.4 04/21/2015   HCT 37.0 04/21/2015   MCV 106* 04/21/2015   PLT 96* 04/21/2015   No results found for: FERRITIN, IRON, TIBC, UIBC, IRONPCTSAT Lab Results  Component Value Date   RETICCTPCT 2.9* 10/30/2014   RBC 3.50* 04/21/2015   RETICCTABS 98.3 10/30/2014   Lab Results  Component Value Date   KPAFRELGTCHN 14.00* 02/15/2015   LAMBDASER 0.19* 02/15/2015   KAPLAMBRATIO 73.68* 02/15/2015   Lab Results  Component Value Date   IGGSERUM 201* 02/15/2015   IGA 17* 02/15/2015   IGMSERUM 1070* 02/15/2015   Lab Results  Component Value Date   TOTALPROTELP 6.5 02/15/2015   ALBUMINELP 4.2 02/15/2015   A1GS 0.3 02/15/2015   A2GS 0.5 02/15/2015   BETS 0.4 02/15/2015   BETA2SER  0.2 02/15/2015   GAMS 1.0 02/15/2015   SPEI * 02/15/2015     Chemistry      Component Value Date/Time   NA 140 02/15/2015 0816   NA 141 12/14/2014 1405   K 3.8 02/15/2015 0816   K 3.9 12/14/2014 1405   CL 104 12/14/2014 1405   CO2 24 02/15/2015 0816   CO2 27 12/14/2014 1405   BUN 10.7 02/15/2015 0816   BUN 8 12/14/2014 1405   CREATININE 0.7 02/15/2015 0816   CREATININE 0.58 12/14/2014 1405      Component Value Date/Time   CALCIUM 8.8 02/15/2015 0816   CALCIUM 9.4 12/14/2014 1405   ALKPHOS 56 02/15/2015 0816   ALKPHOS 54 12/14/2014 1405   AST 20 02/15/2015 0816   AST 19 12/14/2014 1405   ALT 14 02/15/2015 0816   ALT 16 12/14/2014 1405   BILITOT 1.06 02/15/2015 0816   BILITOT 0.7 12/14/2014 1405      Impression and Plan: Kristy [name] is a very pleasant 60 yo white female with history of Waldenstrm's macroglobulinemia. She has done well despite relapses.  Hopefully, her IgM level will be holding steady. Again she is on the 2 Imbruvica daily. It would be nice to keep her on this dosage.  I'll plan to see her back in another 2-3 months.   [name], MD 2/1/20178:25 AM

## 2015-04-21 NOTE — ED Notes (Signed)
Pt c/o dysuria and urinary urgency x last night. Denies fever. She also, c/o sinusitis recurring from her previous visit. She reports nasal congestion x 3 days. No OTC meds.

## 2015-04-22 LAB — IGG, IGA, IGM
IGA/IMMUNOGLOBULIN A, SERUM: 15 mg/dL — AB (ref 87–352)
IGG (IMMUNOGLOBIN G), SERUM: 194 mg/dL — AB (ref 700–1600)
IgM, Qn, Serum: 1350 mg/dL — ABNORMAL HIGH (ref 26–217)

## 2015-04-22 LAB — KAPPA/LAMBDA LIGHT CHAINS
IG KAPPA FREE LIGHT CHAIN: 140.85 mg/L — AB (ref 3.30–19.40)
Ig Lambda Free Light Chain: 1.93 mg/L — ABNORMAL LOW (ref 5.71–26.30)
Kappa/Lambda FluidC Ratio: 72.98 — ABNORMAL HIGH (ref 0.26–1.65)

## 2015-04-26 ENCOUNTER — Telehealth: Payer: Self-pay | Admitting: *Deleted

## 2015-04-26 LAB — PROTEIN ELECTROPHORESIS, SERUM, WITH REFLEX
A/G Ratio: 1.5 (ref 0.7–1.7)
ALPHA 2: 0.4 g/dL (ref 0.4–1.0)
Albumin: 3.9 g/dL (ref 2.9–4.4)
Alpha 1: 0.2 g/dL (ref 0.0–0.4)
BETA: 0.8 g/dL (ref 0.7–1.3)
GLOBULIN, TOTAL: 2.6 g/dL (ref 2.2–3.9)
Gamma Globulin: 1.1 g/dL (ref 0.4–1.8)
Interpretation(See Below): 0
M-Spike, %: 0.8 g/dL — ABNORMAL HIGH
Total Protein: 6.5 g/dL (ref 6.0–8.5)

## 2015-04-26 NOTE — Telephone Encounter (Addendum)
Patient returned call and confirmed message  ----- Message from [name], MD sent at 04/23/2015  4:44 PM EST ----- J On her answering machine. I told her that the IgM level is going back up. It was now 1350. Before, it was 1070. Her Kappa Lightchain also seem to be going up. I told her that she had a go back up to 3 Imburvica pills a day. She had gone down to 2 pills a day. She was doing okay with this.  We have to try to keep better control over the Waldenstrm's. I think increasing the Imbruvica to 3 pills a day will give [place] a better chance.  I told her to call us back to let us know that she got the message.

## 2015-04-28 MED FILL — *IMBRUVICA 140 MG CAPSULE: 140 | 30 days supply | Qty: 90 | Fill #1

## 2015-05-20 ENCOUNTER — Ambulatory Visit (INDEPENDENT_AMBULATORY_CARE_PROVIDER_SITE_OTHER): Payer: Federal, State, Local not specified - PPO | Admitting: Family Medicine

## 2015-05-20 ENCOUNTER — Encounter: Payer: Self-pay | Admitting: Family Medicine

## 2015-05-20 VITALS — BP 112/56 | HR 78 | Wt 121.0 lb

## 2015-05-20 DIAGNOSIS — L03011 Cellulitis of right finger: Secondary | ICD-10-CM

## 2015-05-20 DIAGNOSIS — IMO0002 Reserved for concepts with insufficient information to code with codable children: Secondary | ICD-10-CM | POA: Insufficient documentation

## 2015-05-20 MED ORDER — DOXYCYCLINE HYCLATE 100 MG PO TABS: 100.0000 mg | ORAL_TABLET | Freq: Two times a day (BID) | ORAL | Status: DC

## 2015-05-20 MED ORDER — CEFUROXIME AXETIL 500 MG PO TABS: 500.0000 mg | ORAL_TABLET | Freq: Two times a day (BID) | ORAL | Status: DC

## 2015-05-20 MED FILL — CEFUROXIME AXETIL 500 MG TA: 500 | 7 days supply | Qty: 14 | Fill #0

## 2015-05-20 MED FILL — DOXYCYCLINE 100 MG TABLET: 100 | 7 days supply | Qty: 14 | Fill #0

## 2015-05-20 NOTE — Patient Instructions (Signed)
Thank you for coming in today. Return if not better  Paronychia Paronychia is an infection of the skin that surrounds a nail. It usually affects the skin around a fingernail, but it may also occur near a toenail. It often causes pain and swelling around the nail. This condition may come on suddenly or develop over a longer period. In some cases, a collection of pus (abscess) can form near or under the nail. Usually, paronychia is not serious and it clears up with treatment. CAUSES This condition may be caused by bacteria or fungi. It is commonly caused by either Streptococcus or Staphylococcus bacteria. The bacteria or fungi often cause the infection by getting into the affected area through an opening in the skin, such as a cut or a hangnail. RISK FACTORS This condition is more likely to develop in:  People who get their hands wet often, such as those who work as Designer, industrial/product, bartenders, or nurses.  People who bite their fingernails or suck their thumbs.  People who trim their nails too short.  People who have hangnails or injured fingertips.  People who get manicures.  People who have diabetes. SYMPTOMS Symptoms of this condition include:  Redness and swelling of the skin near the nail.  Tenderness around the nail when you touch the area.  Pus-filled bumps under the cuticle. The cuticle is the skin at the base or sides of the nail.  Fluid or pus under the nail.  Throbbing pain in the area. DIAGNOSIS This condition is usually diagnosed with a physical exam. In some cases, a sample of pus may be taken from an abscess to be tested in a lab. This can help to determine what type of bacteria or fungi is causing the condition. TREATMENT Treatment for this condition depends on the cause and severity of the condition. If the condition is mild, it may clear up on its own in a few days. Your health care provider may recommend soaking the affected area in warm water a few times a day. When  treatment is needed, the options may include:  Antibiotic medicine, if the condition is caused by a bacterial infection.  Antifungal medicine, if the condition is caused by a fungal infection.  Incision and drainage, if an abscess is present. In this procedure, the health care provider will cut open the abscess so the pus can drain out. HOME CARE INSTRUCTIONS  Soak the affected area in warm water if directed to do so by your health care provider. You may be told to do this for 20 minutes, 2-3 times a day. Keep the area dry in between soakings.  Take medicines only as directed by your health care provider.  If you were prescribed an antibiotic medicine, finish all of it even if you start to feel better.  Keep the affected area clean.  Do not try to drain a fluid-filled bump yourself.  If you will be washing dishes or performing other tasks that require your hands to get wet, wear rubber gloves. You should also wear gloves if your hands might come in contact with irritating substances, such as cleaners or chemicals.  Follow your health care provider's instructions about:  Wound care.  Bandage (dressing) changes and removal. SEEK MEDICAL CARE IF:  Your symptoms get worse or do not improve with treatment.  You have a fever or chills.  You have redness spreading from the affected area.  You have continued or increased fluid, blood, or pus coming from the affected area.  Your finger or knuckle becomes swollen or is difficult to move.   This information is not intended to replace advice given to you by your health care provider. Make sure you discuss any questions you have with your health care provider.   Document Released: 08/30/2000 Document Revised: 07/21/2014 Document Reviewed: 02/11/2014 Elsevier Interactive Patient Education [employer].

## 2015-05-21 NOTE — Progress Notes (Signed)
       Kristy [name] is a 60 y.o. female who presents to [facility] today for right third digit and hand paronychia present for about a week. Patient has tried warm water soaks which helped a bit. She was able to express some pus. No fevers chills nausea vomiting or diarrhea. She feels well otherwise.   Past Medical History  Diagnosis Date  . Waldenstrom macroglobulinemia ([place]) 02/07/2012   Past Surgical History  Procedure Laterality Date  . Cesarean section     Social History  Substance Use Topics  . Smoking status: Never Smoker   . Smokeless tobacco: Not on file  . Alcohol Use: No   family history is not on file.  ROS as above Medications: Current Outpatient Prescriptions  Medication Sig Dispense Refill  . Cyanocobalamin (VITAMIN B-12 IJ) Inject as directed.    . IMBRUVICA 140 MG capsul TAKE 3 CAPSULES (420 MG TOTAL) BY MOUTH DAILY. 90 capsule 3  . sodium chloride (OCEAN) 0.65 % SOLN nasal spray Place 1 spray into both nostrils as needed. 1 Bottle 0  . cefUROXime (CEFTIN) 500 MG tablet Take 1 tablet (500 mg total) by mouth 2 (two) times daily with a meal. 14 tablet 0  . doxycycline (VIBRA-TABS) 100 MG tablet Take 1 tablet (100 mg total) by mouth 2 (two) times daily. 14 tablet 0   No current facility-administered medications for this visit.   No Known Allergies   Exam:  BP 112/56 mmHg  Pulse 78  Wt 121 lb (54.885 kg) Gen: Well NAD Right hand third digit erythema at the area around the nail fold. No fluctuance or pus expressible. Mildly tender. Capillary refill and sensation intact distally. Normal finger motion.   No results found for this or any previous visit (from the past 24 hour(s)). No results found.   Please see individual assessment and plan sections.

## 2015-05-21 NOTE — Assessment & Plan Note (Signed)
Without abscess. Likely complicated by Waldenstrom macroglobulinemia with relative immunocompromise status. Treatment with dual agents Ceftin and doxycycline. Recheck in 1 week.

## 2015-05-24 MED FILL — *IMBRUVICA 140 MG CAPSULE: 140 | 30 days supply | Qty: 90 | Fill #2

## 2015-06-24 ENCOUNTER — Other Ambulatory Visit (HOSPITAL_BASED_OUTPATIENT_CLINIC_OR_DEPARTMENT_OTHER): Payer: Federal, State, Local not specified - PPO

## 2015-06-24 ENCOUNTER — Ambulatory Visit (HOSPITAL_BASED_OUTPATIENT_CLINIC_OR_DEPARTMENT_OTHER): Payer: Federal, State, Local not specified - PPO | Admitting: Hematology & Oncology

## 2015-06-24 ENCOUNTER — Encounter: Payer: Self-pay | Admitting: Hematology & Oncology

## 2015-06-24 VITALS — BP 123/68 | HR 95 | Temp 97.6°F | Resp 16 | Ht 63.0 in | Wt 120.0 lb

## 2015-06-24 DIAGNOSIS — C880 Waldenstrom macroglobulinemia: Secondary | ICD-10-CM

## 2015-06-24 LAB — CBC WITH DIFFERENTIAL (CANCER CENTER ONLY)
BASO#: 0 10*3/uL (ref 0.0–0.2)
BASO%: 0.2 % (ref 0.0–2.0)
EOS%: 2.1 % (ref 0.0–7.0)
Eosinophils Absolute: 0.1 10*3/uL (ref 0.0–0.5)
HEMATOCRIT: 36.7 % (ref 34.8–46.6)
HEMOGLOBIN: 12.5 g/dL (ref 11.6–15.9)
LYMPH#: 1.5 10*3/uL (ref 0.9–3.3)
LYMPH%: 27.5 % (ref 14.0–48.0)
MCH: 36.7 pg — ABNORMAL HIGH (ref 26.0–34.0)
MCHC: 34.1 g/dL (ref 32.0–36.0)
MCV: 108 fL — ABNORMAL HIGH (ref 81–101)
MONO#: 0.4 10*3/uL (ref 0.1–0.9)
MONO%: 7.7 % (ref 0.0–13.0)
NEUT%: 62.5 % (ref 39.6–80.0)
NEUTROS ABS: 3.3 10*3/uL (ref 1.5–6.5)
Platelets: 90 10*3/uL — ABNORMAL LOW (ref 145–400)
RBC: 3.41 10*6/uL — AB (ref 3.70–5.32)
RDW: 12.8 % (ref 11.1–15.7)
WBC: 5.3 10*3/uL (ref 3.9–10.0)

## 2015-06-24 LAB — COMPREHENSIVE METABOLIC PANEL
ALBUMIN: 4.2 g/dL (ref 3.5–5.0)
ALK PHOS: 60 U/L (ref 40–150)
ALT: 22 U/L (ref 0–55)
AST: 26 U/L (ref 5–34)
Anion Gap: 9 mEq/L (ref 3–11)
BILIRUBIN TOTAL: 0.98 mg/dL (ref 0.20–1.20)
BUN: 9.2 mg/dL (ref 7.0–26.0)
CO2: 27 mEq/L (ref 22–29)
CREATININE: 0.8 mg/dL (ref 0.6–1.1)
Calcium: 9.1 mg/dL (ref 8.4–10.4)
Chloride: 106 mEq/L (ref 98–109)
EGFR: 84 mL/min/{1.73_m2} — ABNORMAL LOW (ref >90)
GLUCOSE: 94 mg/dL (ref 70–140)
POTASSIUM: 4.4 meq/L (ref 3.5–5.1)
SODIUM: 141 meq/L (ref 136–145)
TOTAL PROTEIN: 7 g/dL (ref 6.4–8.3)

## 2015-06-24 MED FILL — *IMBRUVICA 140 MG CAPSULE: 140 | 30 days supply | Qty: 90 | Fill #3

## 2015-06-24 NOTE — Progress Notes (Signed)
Hematology and Oncology Follow Up Visit  Kristy [name] [id number] 1956-03-03 60 y.o. 06/24/2015   Principle Diagnosis:  Waldenstrm's macroglobulinemia  Current Therapy:   Imbruvica 420 mg PO daily    Interim History:  [name] is here today for a follow-up. She feels well. She has little bit of sinus issue. She thinks this is probably from her allergies. I suppose might be in part from the [facility].  When last saw her, her IgM level was up to 1300 mg/dL. This was up a little bit. As such, I had her increase the Imbruvica up to 3 pills a day. She tolerated this well.  She still working. She's having no problems with increasing fatigue or weakness. She's had no rashes. She's had no change in bowel or bladder habits. She's had no cough.   Her appetite has been good.   She's had no problem with fevers.   She's not sure what she will do over the Easter holiday.   Overall, her performance status is ECOG 0.   Medications:    Medication List       This list is accurate as of: 06/24/15  8:33 AM.  Always use your most recent med list.               sodium chloride 0.65 % Soln nasal spray  Commonly known as:  OCEAN  Place 1 spray into both nostrils as needed.     VITAMIN B-12 IJ  Inject as directed.        Allergies: No Known Allergies  Past Medical History, Surgical history, Social history, and Family History were reviewed and updated.  Review of Systems: All other 10 point review of systems is negative.   Physical Exam:  height is 5\' 3"  (1.6 m) and weight is 120 lb (54.432 kg). Her oral temperature is 97.6 F (36.4 C). Her blood pressure is 123/68 and her pulse is 95. Her respiration is 16.   Wt Readings from Last 3 Encounters:  06/24/15 120 lb (54.432 kg)  05/20/15 121 lb (54.885 kg)  04/21/15 120 lb (54.432 kg)    Well-developed and well-nourished white female who is petite. Head and neck exam shows no ocular or oral lesions. She has no palpable  cervical or supraclavicular lymph nodes. Lungs are clear. Cardiac exam regular rate and rhythm with no murmurs, rubs or bruits. Abdomen is soft. She has good bowel sounds. There is no fluid wave. There is no palpable liver or spleen tip. Back exam shows no tenderness over the spine, ribs or hips. Extremities shows no clubbing, cyanosis or edema. Skin exam shows no rashes, ecchymoses or petechia. Neurological exam shows no focal neurological deficits. Lab Results  Component Value Date   WBC 5.3 06/24/2015   HGB 12.5 06/24/2015   HCT 36.7 06/24/2015   MCV 108* 06/24/2015   PLT 90* 06/24/2015   No results found for: FERRITIN, IRON, TIBC, UIBC, IRONPCTSAT Lab Results  Component Value Date   RETICCTPCT 2.9* 10/30/2014   RBC 3.41* 06/24/2015   RETICCTABS 98.3 10/30/2014   Lab Results  Component Value Date   KPAFRELGTCHN 14.00* 02/15/2015   LAMBDASER 0.19* 02/15/2015   KAPLAMBRATIO 72.98* 04/21/2015   Lab Results  Component Value Date   IGGSERUM 194* 04/21/2015   IGA 17* 02/15/2015   IGMSERUM 1,350* 04/21/2015   Lab Results  Component Value Date   TOTALPROTELP 6.5 02/15/2015   ALBUMINELP 4.2 02/15/2015   A1GS 0.3 02/15/2015   A2GS 0.5 02/15/2015  BETS 0.4 02/15/2015   BETA2SER 0.2 02/15/2015   GAMS 1.0 02/15/2015   MSPIKE 0.8* 04/21/2015   SPEI * 02/15/2015     Chemistry      Component Value Date/Time   NA 141 04/21/2015 0749   NA 141 12/14/2014 1405   K 3.9 04/21/2015 0749   K 3.9 12/14/2014 1405   CL 104 12/14/2014 1405   CO2 26 04/21/2015 0749   CO2 27 12/14/2014 1405   BUN 9.2 04/21/2015 0749   BUN 8 12/14/2014 1405   CREATININE 0.8 04/21/2015 0749   CREATININE 0.58 12/14/2014 1405      Component Value Date/Time   CALCIUM 8.9 04/21/2015 0749   CALCIUM 9.4 12/14/2014 1405   ALKPHOS 60 04/21/2015 0749   ALKPHOS 54 12/14/2014 1405   AST 21 04/21/2015 0749   AST 19 12/14/2014 1405   ALT 21 04/21/2015 0749   ALT 16 12/14/2014 1405   BILITOT 1.20 04/21/2015  0749   BILITOT 0.7 12/14/2014 1405     Impression and Plan: [name] is a very pleasant 60 yo white female with history of Waldenstrm's macroglobulinemia. She has done well despite relapses.   Hopefully, with increasing her dose of Imbruvica, we'll find that her IgM level is back down.  If we find that she has further increase in IgM level, that the we're going to have to make a change with therapy.  We may need to do a bone marrow test on her. She really wants to try to be as conservative as possible and maintain her quality of life.  Personally, I think that a very good option for her would be Velcade with or without Rituxan.   I'll plan to see her back in another 2-3 months.   [name], MD 4/6/20178:33 AM

## 2015-06-25 LAB — IGG, IGA, IGM
IgA, Qn, Serum: 16 mg/dL — ABNORMAL LOW (ref 87–352)
IgG, Qn, Serum: 193 mg/dL — ABNORMAL LOW (ref 700–1600)
IgM, Qn, Serum: 1228 mg/dL — ABNORMAL HIGH (ref 26–217)

## 2015-06-25 LAB — KAPPA/LAMBDA LIGHT CHAINS
Ig Kappa Free Light Chain: 140.13 mg/L — ABNORMAL HIGH (ref 3.30–19.40)
Ig Lambda Free Light Chain: 2.19 mg/L — ABNORMAL LOW (ref 5.71–26.30)
KAPPA/LAMBDA FLC RATIO: 63.99 — AB (ref 0.26–1.65)

## 2015-06-28 ENCOUNTER — Telehealth: Payer: Self-pay | Admitting: Nurse Practitioner

## 2015-06-28 NOTE — Telephone Encounter (Addendum)
Patient verbalized understanding and appreciation.   ----- Message from [name], MD sent at 06/25/2015  1:40 PM EDT ----- Call - tell her that the IgM level is lower!!!  She NEEDS to stay on 3 Imbruvica a day!!  pete

## 2015-06-29 LAB — PROTEIN ELECTROPHORESIS, SERUM, WITH REFLEX
A/G RATIO SPE: 1.7 (ref 0.7–1.7)
Albumin: 4 g/dL (ref 2.9–4.4)
Alpha 1: 0.2 g/dL (ref 0.0–0.4)
Alpha 2: 0.5 g/dL (ref 0.4–1.0)
BETA: 0.8 g/dL (ref 0.7–1.3)
GLOBULIN, TOTAL: 2.4 g/dL (ref 2.2–3.9)
Gamma Globulin: 1 g/dL (ref 0.4–1.8)
INTERPRETATION(SEE BELOW): 0
M-Spike, %: 0.7 g/dL — ABNORMAL HIGH
TOTAL PROTEIN: 6.4 g/dL (ref 6.0–8.5)

## 2015-07-20 ENCOUNTER — Other Ambulatory Visit: Payer: Self-pay | Admitting: Family

## 2015-07-20 MED FILL — *IMBRUVICA 140 MG CAPSULE: 140 | 30 days supply | Qty: 90 | Fill #0

## 2015-08-18 MED FILL — *IMBRUVICA 140 MG CAPSULE: 140 | 30 days supply | Qty: 90 | Fill #1

## 2015-08-26 ENCOUNTER — Ambulatory Visit (HOSPITAL_BASED_OUTPATIENT_CLINIC_OR_DEPARTMENT_OTHER): Payer: Federal, State, Local not specified - PPO | Admitting: Hematology & Oncology

## 2015-08-26 ENCOUNTER — Encounter: Payer: Self-pay | Admitting: Hematology & Oncology

## 2015-08-26 ENCOUNTER — Other Ambulatory Visit (HOSPITAL_BASED_OUTPATIENT_CLINIC_OR_DEPARTMENT_OTHER): Payer: Federal, State, Local not specified - PPO

## 2015-08-26 VITALS — BP 132/79 | HR 97 | Temp 98.2°F | Resp 18 | Ht 63.0 in | Wt 119.0 lb

## 2015-08-26 DIAGNOSIS — J349 Unspecified disorder of nose and nasal sinuses: Secondary | ICD-10-CM

## 2015-08-26 DIAGNOSIS — C880 Waldenstrom macroglobulinemia: Secondary | ICD-10-CM

## 2015-08-26 DIAGNOSIS — J321 Chronic frontal sinusitis: Secondary | ICD-10-CM

## 2015-08-26 LAB — CBC WITH DIFFERENTIAL (CANCER CENTER ONLY)
BASO#: 0 10*3/uL (ref 0.0–0.2)
BASO%: 0.4 % (ref 0.0–2.0)
EOS%: 1.4 % (ref 0.0–7.0)
Eosinophils Absolute: 0.1 10*3/uL (ref 0.0–0.5)
HEMATOCRIT: 38.9 % (ref 34.8–46.6)
HEMOGLOBIN: 13.1 g/dL (ref 11.6–15.9)
LYMPH#: 1.4 10*3/uL (ref 0.9–3.3)
LYMPH%: 24.6 % (ref 14.0–48.0)
MCH: 35.8 pg — ABNORMAL HIGH (ref 26.0–34.0)
MCHC: 33.7 g/dL (ref 32.0–36.0)
MCV: 106 fL — AB (ref 81–101)
MONO#: 0.4 10*3/uL (ref 0.1–0.9)
MONO%: 7.4 % (ref 0.0–13.0)
NEUT%: 66.2 % (ref 39.6–80.0)
NEUTROS ABS: 3.7 10*3/uL (ref 1.5–6.5)
Platelets: 102 10*3/uL — ABNORMAL LOW (ref 145–400)
RBC: 3.66 10*6/uL — ABNORMAL LOW (ref 3.70–5.32)
RDW: 12.6 % (ref 11.1–15.7)
WBC: 5.6 10*3/uL (ref 3.9–10.0)

## 2015-08-26 LAB — COMPREHENSIVE METABOLIC PANEL
ALBUMIN: 4.2 g/dL (ref 3.5–5.0)
ALK PHOS: 63 U/L (ref 40–150)
ALT: 19 U/L (ref 0–55)
ANION GAP: 9 meq/L (ref 3–11)
AST: 22 U/L (ref 5–34)
BUN: 10 mg/dL (ref 7.0–26.0)
CALCIUM: 9.3 mg/dL (ref 8.4–10.4)
CO2: 27 mEq/L (ref 22–29)
CREATININE: 0.8 mg/dL (ref 0.6–1.1)
Chloride: 106 mEq/L (ref 98–109)
EGFR: 82 mL/min/{1.73_m2} — ABNORMAL LOW (ref >90)
Glucose: 90 mg/dl (ref 70–140)
POTASSIUM: 4.2 meq/L (ref 3.5–5.1)
Sodium: 141 mEq/L (ref 136–145)
Total Bilirubin: 0.95 mg/dL (ref 0.20–1.20)
Total Protein: 7 g/dL (ref 6.4–8.3)

## 2015-08-26 MED ORDER — FLUTICASONE PROPIONATE 50 MCG/ACT NA SUSP: 2.0000 | Freq: Every day | NASAL | Status: DC

## 2015-08-26 MED FILL — FLUTICASONE PROP 50 MCG SPR: 50 | 30 days supply | Qty: 16 | Fill #0

## 2015-08-26 NOTE — Progress Notes (Signed)
Hematology and Oncology Follow Up Visit  Kristy [name] [id number] [birth year] 60 y.o. 08/26/2015   Principle Diagnosis:  Waldenstrm's macroglobulinemia  Current Therapy:   Imbruvica 420 mg PO daily    Interim History:  Kristy [name] is here today for a follow-up. She feels well. She has little bit of sinus issue. She thinks this is probably from her allergies. I suppose might be in part from the [facility]. I will go ahead and call in some Flonase nasal spray.  When last saw her, her IgM level was improved at 122 8 mg/dL. Thankfully, this is a little better. She is doing well on the 420 mg daily dose.    She still working. She's having no problems with increasing fatigue or weakness. She's had no rashes. She's had no change in bowel or bladder habits. She's had no cough.   Her appetite has been good.   She's had no problem with fevers.   So far, she has no plans over the summertime.  Overall, her performance status is ECOG 0.   Medications:    Medication List       This list is accurate as of: 08/26/15  8:35 AM.  Always use your most recent med list.               fluticasone 50 MCG/ACT nasal spray  Commonly known as:  FLONASE  Place 2 sprays into both nostrils daily.     IMBRUVICA 140 MG capsul  Generic drug:  ibrutinib  TAKE 3 CAPSULES (420 MG TOTAL) BY MOUTH DAILY.     sodium chloride 0.65 % Soln nasal spray  Commonly known as:  OCEAN  Place 1 spray into both nostrils as needed.     VITAMIN B-12 IJ  Inject as directed.        Allergies: No Known Allergies  Past Medical History, Surgical history, Social history, and Family History were reviewed and updated.  Review of Systems: All other 10 point review of systems is negative.   Physical Exam:  height is 5\' 3"  (1.6 m) and weight is 119 lb (53.978 kg). Her oral temperature is 98.2 F (36.8 C). Her blood pressure is 132/79 and her pulse is 97. Her respiration is 18.   Wt Readings from Last 3  Encounters:  08/26/15 119 lb (53.978 kg)  06/24/15 120 lb (54.432 kg)  05/20/15 121 lb (54.885 kg)    Well-developed and well-nourished white female who is petite. Head and neck exam shows no ocular or oral lesions. She has no palpable cervical or supraclavicular lymph nodes. Lungs are clear. Cardiac exam regular rate and rhythm with no murmurs, rubs or bruits. Abdomen is soft. She has good bowel sounds. There is no fluid wave. There is no palpable liver or spleen tip. Back exam shows no tenderness over the spine, ribs or hips. Extremities shows no clubbing, cyanosis or edema. Skin exam shows no rashes, ecchymoses or petechia. Neurological exam shows no focal neurological deficits. Lab Results  Component Value Date   WBC 5.6 08/26/2015   HGB 13.1 08/26/2015   HCT 38.9 08/26/2015   MCV 106* 08/26/2015   PLT 102* 08/26/2015   No results found for: FERRITIN, IRON, TIBC, UIBC, IRONPCTSAT Lab Results  Component Value Date   RETICCTPCT 2.9* 10/30/2014   RBC 3.66* 08/26/2015   RETICCTABS 98.3 10/30/2014   Lab Results  Component Value Date   KPAFRELGTCHN 14.00* 02/15/2015   LAMBDASER 0.19* 02/15/2015   KAPLAMBRATIO 63.99* 06/24/2015   Lab Results  Component Value Date   IGGSERUM 193* 06/24/2015   IGA 17* 02/15/2015   IGMSERUM 1,228* 06/24/2015   Lab Results  Component Value Date   TOTALPROTELP 6.5 02/15/2015   ALBUMINELP 4.2 02/15/2015   A1GS 0.3 02/15/2015   A2GS 0.5 02/15/2015   BETS 0.4 02/15/2015   BETA2SER 0.2 02/15/2015   GAMS 1.0 02/15/2015   MSPIKE 0.7* 06/24/2015   SPEI * 02/15/2015     Chemistry      Component Value Date/Time   NA 141 06/24/2015 0746   NA 141 12/14/2014 1405   K 4.4 06/24/2015 0746   K 3.9 12/14/2014 1405   CL 104 12/14/2014 1405   CO2 27 06/24/2015 0746   CO2 27 12/14/2014 1405   BUN 9.2 06/24/2015 0746   BUN 8 12/14/2014 1405   CREATININE 0.8 06/24/2015 0746   CREATININE 0.58 12/14/2014 1405      Component Value Date/Time   CALCIUM  9.1 06/24/2015 0746   CALCIUM 9.4 12/14/2014 1405   ALKPHOS 60 06/24/2015 0746   ALKPHOS 54 12/14/2014 1405   AST 26 06/24/2015 0746   AST 19 12/14/2014 1405   ALT 22 06/24/2015 0746   ALT 16 12/14/2014 1405   BILITOT 0.98 06/24/2015 0746   BILITOT 0.7 12/14/2014 1405     Impression and Plan: Kristy [name] is a very pleasant 60 yo white female with history of Waldenstrm's macroglobulinemia. She has done well despite relapses.   We will see what her IgM level is. I would have think that it will be better or decreased.  I went ahead and ordered some Flonase for her. I don't see that there is any interaction with the Imbruvica.  We will plan to get her back in 3 months now. I did this to be reasonable so we can get her through the summer.  Marland Kitchen [name], MD 6/8/20178:35 AM

## 2015-08-27 ENCOUNTER — Telehealth: Payer: Self-pay | Admitting: *Deleted

## 2015-08-27 LAB — IGG, IGA, IGM
IGA/IMMUNOGLOBULIN A, SERUM: 15 mg/dL — AB (ref 87–352)
IGG (IMMUNOGLOBIN G), SERUM: 187 mg/dL — AB (ref 700–1600)
IgM, Qn, Serum: 1268 mg/dL — ABNORMAL HIGH (ref 26–217)

## 2015-08-27 NOTE — Telephone Encounter (Addendum)
Patient aware of results  ----- Message from [name], MD sent at 08/27/2015  7:05 AM EDT ----- Call - IgM level is essentially stable!!  NO change in the Imbruvica dose!!!  Have a great summer!!!  Kristy [name]

## 2015-09-13 MED FILL — *IMBRUVICA 140 MG CAPSULE: 140 | 30 days supply | Qty: 90 | Fill #2

## 2015-09-22 ENCOUNTER — Encounter: Payer: Self-pay | Admitting: Nurse Practitioner

## 2015-09-22 NOTE — Progress Notes (Signed)
Patient called stated she was having some tongue sensitivity and dry mouth. She stated she drinks lots of juice and eat lots of acidic fruits. She states she has pulled back from eating so much and drinking lots of acid related food and drinks, however, her daughter suggested MMW. Spoke with Judson Roch, NP and at this time no MMW will be called in. Patient instructed to limit acid intake, continue to rinse mouth thoroughly, if she notice open sores to contact office. She is also going to try taking antacid. Pt verbalized understanding and appreciation.

## 2015-09-23 ENCOUNTER — Ambulatory Visit (INDEPENDENT_AMBULATORY_CARE_PROVIDER_SITE_OTHER): Payer: Federal, State, Local not specified - PPO | Admitting: Osteopathic Medicine

## 2015-09-23 ENCOUNTER — Encounter: Payer: Self-pay | Admitting: Osteopathic Medicine

## 2015-09-23 VITALS — BP 125/76 | HR 84 | Temp 98.1°F | Ht 60.0 in | Wt 118.0 lb

## 2015-09-23 DIAGNOSIS — C880 Waldenstrom macroglobulinemia: Secondary | ICD-10-CM | POA: Diagnosis not present

## 2015-09-23 DIAGNOSIS — A499 Bacterial infection, unspecified: Secondary | ICD-10-CM

## 2015-09-23 DIAGNOSIS — R682 Dry mouth, unspecified: Secondary | ICD-10-CM

## 2015-09-23 DIAGNOSIS — J329 Chronic sinusitis, unspecified: Secondary | ICD-10-CM

## 2015-09-23 DIAGNOSIS — B9689 Other specified bacterial agents as the cause of diseases classified elsewhere: Secondary | ICD-10-CM

## 2015-09-23 MED ORDER — CHLORHEXIDINE GLUCONATE 0.12% ORAL RINSE (MEDLINE KIT)
15.0000 mL | Freq: Two times a day (BID) | OROMUCOSAL | Status: DC
Start: 2015-09-23 — End: 2015-09-23

## 2015-09-23 MED ORDER — IPRATROPIUM BROMIDE 0.03 % NA SOLN
2.0000 | Freq: Two times a day (BID) | NASAL | Status: DC
Start: 2015-09-23 — End: 2015-11-26

## 2015-09-23 MED ORDER — CHLORHEXIDINE GLUCONATE 0.12 % MT SOLN: 15.0000 mL | Freq: Two times a day (BID) | OROMUCOSAL | Status: DC

## 2015-09-23 MED ORDER — AMOXICILLIN-POT CLAVULANATE 500-125 MG PO TABS: ORAL_TABLET | ORAL | Status: AC

## 2015-09-23 MED FILL — CHLORHEXIDINE 0.12% RINSE: 0.12 | 16 days supply | Qty: 473 | Fill #0

## 2015-09-23 MED FILL — IPRATROPIUM 0.03% SPRAY: 0.03 | 30 days supply | Qty: 30 | Fill #0

## 2015-09-23 MED FILL — AMOX-CLAV 500-125 MG TABLET: 500-125 | 10 days supply | Qty: 30 | Fill #0

## 2015-09-23 NOTE — Patient Instructions (Signed)
If no better after finishing the antibiotics, let us know. We can escalate therapy to a different prescription and consider CT of the sinuses.

## 2015-09-23 NOTE — Progress Notes (Signed)
HPI: Kristy [name] is a 60 y.o. female who presents to [facility]  today for chief complaint of:  Chief Complaint  Patient presents with  . Sinus Problem    . Context: reports sinus issues, was seen in the past and had abx, felt better then came back, and then she maybe waited awhile to come back in.  . Location: sinuses, throat . Quality: pressure in sinuses, salty taste in mouth is bothersome.  . Assoc signs/symptoms: see ROS - complains most of taste in the mouth and concern for thrush  . Duration: few months  . Modifying factors: has tried the following OTC/Rx medications: none other than saltwater and baking soda. Not compliant with flonase Rx by Hematology - concern for sinus issues w/ Waldenstrom's    Past medical, social and family history reviewed. Current medications and allergies reviewed.  History of Waldenstrom's macroglobulinemia, following with heme-onc and taking Imbruvica  Review of Systems: CONSTITUTIONAL: no fever/chills HEAD/EYES/EARS/NOSE/THROAT: sinus headache, no vision change or hearing change, no sore throat CARDIAC: No chest pain, No pressure/palpitations RESPIRATORY: no cough, no shortness of breath GASTROINTESTINAL: no nausea, no vomiting, no abdominal pain, no diarrhea MUSCULOSKELETAL: no myalgia/arthralgia SKIN: no rash   Exam:  BP 125/76 mmHg  Pulse 84  Temp(Src) 98.1 F (36.7 C) (Oral)  Ht 5' (1.524 m)  Wt 118 lb (53.524 kg)  BMI 23.05 kg/m2 Constitutional: VSS, see above. General Appearance: alert, well-developed, well-nourished, NAD Eyes: Normal lids and conjunctive, non-icteric sclera, PERRLA Ears, Nose, Mouth, Throat: Normal external inspection ears/nares/mouth/lips/gums, normal TM, MMM;       posterior pharynx with erythema, without exudate, nasal mucosa normal. Plaque on tongue but no petechaie/candida  Neck: No masses, trachea midline. normal lymph nodes Respiratory: Normal respiratory effort.  No  wheeze/rhonchi/rales Cardiovascular: S1/S2 normal, no murmur/rub/gallop auscultated. RRR.    ASSESSMENT/PLAN: Trial abx, consider escalation of therapy if Augmentin is not effective, would consider CT sinuses after that given duration of her symptoms. Patient also was counseled to trial consistent use of Flonase to prevent allergy issues, use Atrovent for now for severe congestion. Mouth dryness/stomatitis is a side effect of her Imbruvica, as his sinusitis. Consider discussion with hematology/oncology if escalation of antibiotic therapy/CT sinus still does not give [place] any further guidance on chronicity of her symptoms.  Bacterial sinusitis - Plan: amoxicillin-clavulanate (AUGMENTIN) 500-125 MG tablet, ipratropium (ATROVENT) 0.03 % nasal spray  Mouth dryness - Plan: DISCONTINUED: chlorhexidine gluconate, SAGE KIT, (PERIDEX) 0.12 % solution  Waldenstrom macroglobulinemia (HCC)    Visit summary was printed for the patient with medications and pertinent instructions for patient to review. ER/RTC precautions reviewed. All questions answered. Return if symptoms worsen or fail to improve.  Total time spent 25 minutes, greater than 50% of the visit was counseling and coordinating care for diagnosis of sinusitis, macroglobulinemia.

## 2015-10-18 MED FILL — *IMBRUVICA 140 MG CAPSULE: 140 | 30 days supply | Qty: 90 | Fill #3

## 2015-11-09 ENCOUNTER — Encounter: Payer: Self-pay | Admitting: *Deleted

## 2015-11-09 ENCOUNTER — Emergency Department
Admission: EM | Admit: 2015-11-09 | Discharge: 2015-11-09 | Disposition: A | Discharge status: Home / Self Care | Payer: Federal, State, Local not specified - PPO | Source: Home / Self Care | Attending: Family Medicine | Admitting: Family Medicine

## 2015-11-09 DIAGNOSIS — N390 Urinary tract infection, site not specified: Secondary | ICD-10-CM

## 2015-11-09 DIAGNOSIS — R300 Dysuria: Secondary | ICD-10-CM

## 2015-11-09 DIAGNOSIS — N3001 Acute cystitis with hematuria: Secondary | ICD-10-CM

## 2015-11-09 LAB — POCT URINALYSIS DIP (MANUAL ENTRY)
Glucose, UA: 100 — AB
Nitrite, UA: POSITIVE — AB
Protein Ur, POC: >=300 — AB
Spec Grav, UA: 1.01 (ref 1.005–1.03)
Urobilinogen, UA: 4 (ref 0–1)
pH, UA: 8.5 (ref 5–8)

## 2015-11-09 MED ORDER — PHENAZOPYRIDINE HCL 200 MG PO TABS: 200.0000 mg | ORAL_TABLET | Freq: Three times a day (TID) | ORAL | 0 refills | Status: DC

## 2015-11-09 MED ORDER — CEPHALEXIN 500 MG PO CAPS: 500.0000 mg | ORAL_CAPSULE | Freq: Two times a day (BID) | ORAL | 0 refills | Status: AC

## 2015-11-09 NOTE — ED Provider Notes (Signed)
CSN: [id number]     Arrival date & time 11/09/15  1433 History   First MD Initiated Contact with Patient 11/09/15 1457     Chief Complaint  Patient presents with  . Dysuria   (Consider location/radiation/quality/duration/timing/severity/associated sxs/prior Treatment) HPI  Kristy [name] is a 60 y.o. female presenting to UC with c/o sudden onset dysuria, urinary frequency, and bladder pressure that started this morning.  Hx of UTIs several years ago but she notes she started using a new OTC KY jelly for vaginal dryness and thinks that is what may have caused current UTI. Denies fever, chills, n/v/d, or new back pain. Hx of mild back cramping but no different than her regular back pain.     Past Medical History:  Diagnosis Date  . Waldenstrom macroglobulinemia ([place]) 02/07/2012   Past Surgical History:  Procedure Laterality Date  . CESAREAN SECTION     History reviewed. No pertinent family history. Social History  Substance Use Topics  . Smoking status: Never Smoker  . Smokeless tobacco: Never Used  . Alcohol use No   OB History    No data available     Review of Systems  Constitutional: Negative for chills and fever.  Gastrointestinal: Positive for abdominal pain ( bladder pressure). Negative for diarrhea, nausea and vomiting.  Genitourinary: Positive for dysuria, frequency, hematuria and urgency. Negative for decreased urine volume, flank pain, pelvic pain, vaginal bleeding, vaginal discharge and vaginal pain.  Musculoskeletal: Negative for back pain and myalgias.    Allergies  Review of patient's allergies indicates no known allergies.  Home Medications   Prior to Admission medications   Medication Sig Start Date End Date Taking? Authorizing Provider  cephALEXin (KEFLEX) 500 MG capsule Take 1 capsule (500 mg total) by mouth 2 (two) times daily. 11/09/15 11/16/15  Noland Fordyce, PA-C  chlorhexidine (PERIDEX) 0.12 % solution Use as directed 15 mLs in the mouth or throat  2 (two) times daily. 09/23/15   Emeterio Reeve, DO  Cyanocobalamin (VITAMIN B-12 IJ) Inject as directed.    Historical Provider, MD  fluticasone (FLONASE) 50 MCG/ACT nasal spray Place 2 sprays into both nostrils daily. 08/26/15   [name], MD  IMBRUVICA 140 MG capsul TAKE 3 CAPSULES (420 MG TOTAL) BY MOUTH DAILY. 07/20/15   Eliezer Bottom, NP  ipratropium (ATROVENT) 0.03 % nasal spray Place 2 sprays into both nostrils every 12 (twelve) hours. 09/23/15   Emeterio Reeve, DO  phenazopyridine (PYRIDIUM) 200 MG tablet Take 1 tablet (200 mg total) by mouth 3 (three) times daily. 11/09/15   Noland Fordyce, PA-C  sodium chloride (OCEAN) 0.65 % SOLN nasal spray Place 1 spray into both nostrils as needed. 04/02/15   Noland Fordyce, PA-C   Meds Ordered and Administered this Visit  Medications - No data to display  There were no vitals taken for this visit. No data found.   Physical Exam  Constitutional: She is oriented to person, place, and time. She appears well-developed and well-nourished. No distress.  HENT:  Head: Normocephalic and atraumatic.  Mouth/Throat: Oropharynx is clear and moist.  Eyes: EOM are normal.  Neck: Normal range of motion.  Cardiovascular: Normal rate and regular rhythm.   Pulmonary/Chest: Effort normal. No respiratory distress. She has no wheezes. She has no rales.  Abdominal: Soft. She exhibits no distension and no mass. There is no tenderness. There is no rebound, no guarding and no CVA tenderness.  Musculoskeletal: Normal range of motion.  Neurological: She is alert and oriented to  person, place, and time.  Skin: Skin is warm and dry. She is not diaphoretic.  Psychiatric: She has a normal mood and affect. Her behavior is normal.  Nursing note and vitals reviewed.   Urgent Care Course   Clinical Course    Procedures (including critical care time)  Labs Review Labs Reviewed  POCT URINALYSIS DIP (MANUAL ENTRY) - Abnormal; Notable for the following:        Result Value   Color, UA red (*)    Clarity, UA cloudy (*)    Glucose, UA =100 (*)    Bilirubin, UA large (*)    Ketones, POC UA small (15) (*)    Blood, UA large (*)    Protein Ur, POC >=300 (*)    Nitrite, UA Positive (*)    Leukocytes, UA large (3+) (*)    All other components within normal limits  URINE CULTURE    Imaging Review No results found.    MDM   1. Dysuria   2. UTI (lower urinary tract infection)   3. Acute cystitis with hematuria    Sudden onset UTI symptoms since this morning.   UA c/w UTI, will send culture. Will hold off on labs as pt is afebrile, no vomiting. No CVAT. Appears well, non-toxic.  Encouraged fluids and rest. May try OTZ Azo or prescribed Pyridium for urinary symptoms until antibiotics start working.  Rx: Keflex and pyridium  Encouraged f/u with PCP if not improving in 4-5 days, sooner if worsening, return to UC if needed. Patient verbalized understanding and agreement with treatment plan.       Noland Fordyce, PA-C 11/09/15 [phone]

## 2015-11-09 NOTE — ED Triage Notes (Signed)
Pt c/o dysuria, urinary urgency and bladder pressure x today. Denies fever.

## 2015-11-12 ENCOUNTER — Telehealth: Payer: Self-pay | Admitting: *Deleted

## 2015-11-12 LAB — URINE CULTURE: Colony Count: >=100000

## 2015-11-12 NOTE — Telephone Encounter (Signed)
Callback: spoke to pt given UCx results. She reports that she is feeling better with Kelflex. Per Hermina Staggers complete ABT and call back if she is not continuing to improve and we can change her to another ABT. Pt agrees.

## 2015-11-15 ENCOUNTER — Other Ambulatory Visit: Payer: Self-pay | Admitting: Family

## 2015-11-15 MED FILL — *IMBRUVICA 140 MG CAPSULE: 140 | 30 days supply | Qty: 90 | Fill #0

## 2015-11-22 ENCOUNTER — Emergency Department
Admission: EM | Admit: 2015-11-22 | Discharge: 2015-11-22 | Disposition: A | Discharge status: Home / Self Care | Payer: Federal, State, Local not specified - PPO | Source: Home / Self Care | Attending: Family Medicine | Admitting: Family Medicine

## 2015-11-22 ENCOUNTER — Encounter: Payer: Self-pay | Admitting: *Deleted

## 2015-11-22 DIAGNOSIS — R3989 Other symptoms and signs involving the genitourinary system: Secondary | ICD-10-CM

## 2015-11-22 DIAGNOSIS — R300 Dysuria: Secondary | ICD-10-CM | POA: Diagnosis not present

## 2015-11-22 DIAGNOSIS — R350 Frequency of micturition: Secondary | ICD-10-CM

## 2015-11-22 MED ORDER — SULFAMETHOXAZOLE-TRIMETHOPRIM 800-160 MG PO TABS: 1.0000 | ORAL_TABLET | Freq: Two times a day (BID) | ORAL | 0 refills | Status: DC

## 2015-11-22 NOTE — ED Provider Notes (Signed)
CSN: [id number]     Arrival date & time 11/22/15  0933 History   First MD Initiated Contact with Patient 11/22/15 [phone]     Chief Complaint  Patient presents with  . Dysuria   (Consider location/radiation/quality/duration/timing/severity/associated sxs/prior Treatment) HPI Kristy [name] is a 60 y.o. female presenting to UC with c/o dysuria, urinary frequency, and bladder pressure for about 2 days. Pt was seen at [facility] on 11/09/15, dx  With a UTI. She completed a course of Keflex and noted symptoms did resolve but are now back.  Pt did start taking Azo yesterday, which does help with symptoms.  Pt notes she plans to f/u with and OB/GYN for recurrent UTI as she notes she believes her body is "changing" with age.  Denies fever, chills, n/v/d.    Past Medical History:  Diagnosis Date  . Waldenstrom macroglobulinemia ([place]) 02/07/2012   Past Surgical History:  Procedure Laterality Date  . CESAREAN SECTION     History reviewed. No pertinent family history. Social History  Substance Use Topics  . Smoking status: Never Smoker  . Smokeless tobacco: Never Used  . Alcohol use No   OB History    No data available     Review of Systems  Constitutional: Negative for chills and fever.  Gastrointestinal: Negative for abdominal pain, diarrhea, nausea and vomiting.  Genitourinary: Positive for dysuria, frequency, pelvic pain (bladder pressure) and urgency. Negative for decreased urine volume, hematuria, vaginal bleeding, vaginal discharge and vaginal pain.  Musculoskeletal: Negative for back pain and myalgias.    Allergies  Review of patient's allergies indicates no known allergies.  Home Medications   Prior to Admission medications   Medication Sig Start Date End Date Taking? Authorizing Provider  chlorhexidine (PERIDEX) 0.12 % solution Use as directed 15 mLs in the mouth or throat 2 (two) times daily. 09/23/15   Emeterio Reeve, DO  Cyanocobalamin (VITAMIN B-12 IJ) Inject as directed.     Historical Provider, MD  fluticasone (FLONASE) 50 MCG/ACT nasal spray Place 2 sprays into both nostrils daily. 08/26/15   [name], MD  IMBRUVICA 140 MG capsul TAKE 3 CAPSULES (420 MG TOTAL) BY MOUTH DAILY. 11/15/15   Eliezer Bottom, NP  ipratropium (ATROVENT) 0.03 % nasal spray Place 2 sprays into both nostrils every 12 (twelve) hours. 09/23/15   Emeterio Reeve, DO  phenazopyridine (PYRIDIUM) 200 MG tablet Take 1 tablet (200 mg total) by mouth 3 (three) times daily. 11/09/15   Noland Fordyce, PA-C  sodium chloride (OCEAN) 0.65 % SOLN nasal spray Place 1 spray into both nostrils as needed. 04/02/15   Noland Fordyce, PA-C  sulfamethoxazole-trimethoprim (BACTRIM DS,SEPTRA DS) 800-160 MG tablet Take 1 tablet by mouth 2 (two) times daily. 11/22/15 11/29/15  Noland Fordyce, PA-C   Meds Ordered and Administered this Visit  Medications - No data to display  BP 117/74 (BP Location: Left Arm)   Pulse 97   Temp 97.5 F (36.4 C) (Oral)   Resp 16   Ht 5' (1.524 m)   Wt 117 lb (53.1 kg)   SpO2 96%   BMI 22.85 kg/m  No data found.   Physical Exam  Constitutional: She is oriented to person, place, and time. She appears well-developed and well-nourished.  HENT:  Head: Normocephalic and atraumatic.  Mouth/Throat: Oropharynx is clear and moist.  Eyes: EOM are normal.  Neck: Normal range of motion.  Cardiovascular: Normal rate and regular rhythm.   Pulmonary/Chest: Effort normal and breath sounds normal. No respiratory distress. She  has no wheezes. She has no rales.  Abdominal: Soft. She exhibits no distension. There is no tenderness. There is no guarding and no CVA tenderness.  Musculoskeletal: Normal range of motion.  Neurological: She is alert and oriented to person, place, and time.  Skin: Skin is warm and dry.  Psychiatric: She has a normal mood and affect. Her behavior is normal.  Nursing note and vitals reviewed.   Urgent Care Course   Clinical Course    Procedures (including  critical care time)  Labs Review Labs Reviewed  URINE CULTURE    Imaging Review No results found.    MDM   1. Dysuria   2. Urinary frequency   3. Sensation of pressure in bladder area    Pt c/o urinary symptoms that started 2 days ago following recent treatment of UTI on 11/09/15. Urine culture reviewed.  Pt does have hx of Waldenstrom Macroglobinemia.  Pt denies known kidney problems.  Per urine culture, Bactrim may treat UTI, will hold off on Cipro due to risks of kidney injury and tendon rupture.  Rx: Bactrim  May continue Azo. Encouraged fluids    Noland Fordyce, PA-C 11/22/15 1039

## 2015-11-22 NOTE — ED Triage Notes (Signed)
Pt c/o dysuria, urinary frequency and lower abd pressure x 2 days. Denies fever.

## 2015-11-25 LAB — URINE CULTURE

## 2015-11-26 ENCOUNTER — Ambulatory Visit (HOSPITAL_BASED_OUTPATIENT_CLINIC_OR_DEPARTMENT_OTHER): Payer: Federal, State, Local not specified - PPO | Admitting: Hematology & Oncology

## 2015-11-26 ENCOUNTER — Encounter: Payer: Self-pay | Admitting: Hematology & Oncology

## 2015-11-26 ENCOUNTER — Other Ambulatory Visit: Payer: Federal, State, Local not specified - PPO

## 2015-11-26 ENCOUNTER — Telehealth: Payer: Self-pay | Admitting: Emergency Medicine

## 2015-11-26 VITALS — BP 136/72 | HR 86 | Temp 97.5°F | Resp 16 | Ht 60.0 in | Wt 115.0 lb

## 2015-11-26 DIAGNOSIS — J329 Chronic sinusitis, unspecified: Secondary | ICD-10-CM | POA: Diagnosis not present

## 2015-11-26 DIAGNOSIS — C880 Waldenstrom macroglobulinemia: Secondary | ICD-10-CM

## 2015-11-26 DIAGNOSIS — J321 Chronic frontal sinusitis: Secondary | ICD-10-CM

## 2015-11-26 LAB — CBC WITH DIFFERENTIAL (CANCER CENTER ONLY)
BASO#: 0 10*3/uL (ref 0.0–0.2)
BASO%: 0.4 % (ref 0.0–2.0)
EOS ABS: 0.1 10*3/uL (ref 0.0–0.5)
EOS%: 1.3 % (ref 0.0–7.0)
HEMATOCRIT: 37.3 % (ref 34.8–46.6)
HGB: 12.6 g/dL (ref 11.6–15.9)
LYMPH#: 1.3 10*3/uL (ref 0.9–3.3)
LYMPH%: 27.3 % (ref 14.0–48.0)
MCH: 35.8 pg — AB (ref 26.0–34.0)
MCHC: 33.8 g/dL (ref 32.0–36.0)
MCV: 106 fL — AB (ref 81–101)
MONO#: 0.3 10*3/uL (ref 0.1–0.9)
MONO%: 6.3 % (ref 0.0–13.0)
NEUT#: 3 10*3/uL (ref 1.5–6.5)
NEUT%: 64.7 % (ref 39.6–80.0)
Platelets: 107 10*3/uL — ABNORMAL LOW (ref 145–400)
RBC: 3.52 10*6/uL — ABNORMAL LOW (ref 3.70–5.32)
RDW: 13.1 % (ref 11.1–15.7)
WBC: 4.6 10*3/uL (ref 3.9–10.0)

## 2015-11-26 LAB — COMPREHENSIVE METABOLIC PANEL
ALT: 17 U/L (ref 0–55)
AST: 21 U/L (ref 5–34)
Albumin: 4.2 g/dL (ref 3.5–5.0)
Alkaline Phosphatase: 69 U/L (ref 40–150)
Anion Gap: 10 mEq/L (ref 3–11)
BUN: 7.4 mg/dL (ref 7.0–26.0)
CHLORIDE: 105 meq/L (ref 98–109)
CO2: 24 meq/L (ref 22–29)
Calcium: 9.1 mg/dL (ref 8.4–10.4)
Creatinine: 0.8 mg/dL (ref 0.6–1.1)
EGFR: 79 mL/min/{1.73_m2} — AB (ref >90)
GLUCOSE: 79 mg/dL (ref 70–140)
POTASSIUM: 3.9 meq/L (ref 3.5–5.1)
SODIUM: 139 meq/L (ref 136–145)
Total Bilirubin: 0.91 mg/dL (ref 0.20–1.20)
Total Protein: 7 g/dL (ref 6.4–8.3)

## 2015-11-26 NOTE — Telephone Encounter (Signed)
Culture did grow bacteria, finish meds, increase water intake

## 2015-11-26 NOTE — Progress Notes (Signed)
Hematology and Oncology Follow Up Visit  Kristy [name] [id number] [birth year] 60 y.o. 11/26/2015   Principle Diagnosis:  Waldenstrm's macroglobulinemia  Current Therapy:   Imbruvica 420 mg PO daily    Interim History:  Kristy [name] is here today for a follow-up. She feels well. She has little bit of sinus issue. She saw the ENT doctor recently., Sure if they did anything for her.  She did have a recent urinary tract infection. She's been on some antibiotics for this.  When last saw her, her IgM level was 1268 milligrams per deciliter. Unfortunate, her IgG level is quite low. It was 187. Her IgA level also has been quite low. Thankfully, this is a little better. She is doing well on the 420 mg daily dose.    She still working. She's having no problems with increasing fatigue or weakness. She's had no rashes. She's had no change in bowel or bladder habits. She's had no cough.   Her appetite has been good.   She's had no problem with fevers.   In October, she and her family will be going to the [place] for vacation.  Overall, her performance status is ECOG 0.   Medications:    Medication List       Accurate as of 11/26/15  9:25 AM. Always use your most recent med list.          fluticasone 50 MCG/ACT nasal spray Commonly known as:  FLONASE Place 2 sprays into both nostrils daily.   IMBRUVICA 140 MG capsul Generic drug:  ibrutinib TAKE 3 CAPSULES (420 MG TOTAL) BY MOUTH DAILY.   VITAMIN B-12 IJ Inject as directed.       Allergies: No Known Allergies  Past Medical History, Surgical history, Social history, and Family History were reviewed and updated.  Review of Systems: All other 10 point review of systems is negative.   Physical Exam:  height is 5' (1.524 m) and weight is 115 lb (52.2 kg). Her oral temperature is 97.5 F (36.4 C). Her blood pressure is 136/72 and her pulse is 86. Her respiration is 16.   Wt Readings from Last 3 Encounters:  11/26/15  115 lb (52.2 kg)  11/22/15 117 lb (53.1 kg)  09/23/15 118 lb (53.5 kg)    Well-developed and well-nourished white female who is petite. Head and neck exam shows no ocular or oral lesions. She has no palpable cervical or supraclavicular lymph nodes. Lungs are clear. Cardiac exam regular rate and rhythm with no murmurs, rubs or bruits. Abdomen is soft. She has good bowel sounds. There is no fluid wave. There is no palpable liver or spleen tip. Back exam shows no tenderness over the spine, ribs or hips. Extremities shows no clubbing, cyanosis or edema. Skin exam shows no rashes, ecchymoses or petechia. Neurological exam shows no focal neurological deficits. Lab Results  Component Value Date   WBC 4.6 11/26/2015   HGB 12.6 11/26/2015   HCT 37.3 11/26/2015   MCV 106 (H) 11/26/2015   PLT 107 (L) 11/26/2015   No results found for: FERRITIN, IRON, TIBC, UIBC, IRONPCTSAT Lab Results  Component Value Date   RETICCTPCT 2.9 (H) 10/30/2014   RBC 3.52 (L) 11/26/2015   RETICCTABS 98.3 10/30/2014   Lab Results  Component Value Date   KPAFRELGTCHN 14.00 (H) 02/15/2015   LAMBDASER 0.19 (L) 02/15/2015   KAPLAMBRATIO 63.99 (H) 06/24/2015   Lab Results  Component Value Date   IGGSERUM 187 (L) 08/26/2015   IGA 17 (L)  02/15/2015   IGMSERUM 1,268 (H) 08/26/2015   Lab Results  Component Value Date   TOTALPROTELP 6.5 02/15/2015   ALBUMINELP 4.2 02/15/2015   A1GS 0.3 02/15/2015   A2GS 0.5 02/15/2015   BETS 0.4 02/15/2015   BETA2SER 0.2 02/15/2015   GAMS 1.0 02/15/2015   MSPIKE 0.7 (H) 06/24/2015   SPEI * 02/15/2015     Chemistry      Component Value Date/Time   NA 141 08/26/2015 0800   K 4.2 08/26/2015 0800   CL 104 12/14/2014 1405   CO2 27 08/26/2015 0800   BUN 10.0 08/26/2015 0800   CREATININE 0.8 08/26/2015 0800      Component Value Date/Time   CALCIUM 9.3 08/26/2015 0800   ALKPHOS 63 08/26/2015 0800   AST 22 08/26/2015 0800   ALT 19 08/26/2015 0800   BILITOT 0.95 08/26/2015 0800       Impression and Plan: Kristy [name] is a very pleasant 60 yo white female with history of Waldenstrm's macroglobulinemia. She has done well despite relapses.   We will see what her IgM level is. I would have think that it will be better or decreased.  Of note, her IgG levels have was been quite low. Her IgA levels have also been low. Because of this, her sinusitis might be a continued problem. I talked her about the possibility of using IVIG. For right now, she wants to hold off on this as her sinuses seem to be getting a little bit better.   I'll plan to see her back in 3 months. I know that she will have a [place] time [place]   [name], MD 9/8/20179:25 AM

## 2015-11-29 LAB — KAPPA/LAMBDA LIGHT CHAINS
Ig Kappa Free Light Chain: 90.4 mg/L — ABNORMAL HIGH (ref 3.3–19.4)
Ig Lambda Free Light Chain: 2.6 mg/L — ABNORMAL LOW (ref 5.7–26.3)
Kappa/Lambda FluidC Ratio: 34.77 — ABNORMAL HIGH (ref 0.26–1.65)

## 2015-12-01 LAB — MULTIPLE MYELOMA PANEL, SERUM
ALBUMIN SERPL ELPH-MCNC: 4.2 g/dL (ref 2.9–4.4)
ALBUMIN/GLOB SERPL: 1.9 — AB (ref 0.7–1.7)
Alpha 1: 0.2 g/dL (ref 0.0–0.4)
Alpha2 Glob SerPl Elph-Mcnc: 0.5 g/dL (ref 0.4–1.0)
B-Globulin SerPl Elph-Mcnc: 0.8 g/dL (ref 0.7–1.3)
GAMMA GLOB SERPL ELPH-MCNC: 0.8 g/dL (ref 0.4–1.8)
GLOBULIN, TOTAL: 2.3 g/dL (ref 2.2–3.9)
IGA/IMMUNOGLOBULIN A, SERUM: 30 mg/dL — AB (ref 87–352)
IgG, Qn, Serum: 210 mg/dL — ABNORMAL LOW (ref 700–1600)
M Protein SerPl Elph-Mcnc: 0.6 g/dL — ABNORMAL HIGH
Total Protein: 6.5 g/dL (ref 6.0–8.5)

## 2015-12-02 ENCOUNTER — Telehealth: Payer: Self-pay

## 2015-12-02 NOTE — Telephone Encounter (Signed)
IgM level is down to 1130!! This is great!! pete   Above message given to pt via phone. dph

## 2015-12-14 MED FILL — *IMBRUVICA 140 MG CAPSULE: 140 | 30 days supply | Qty: 90 | Fill #1

## 2016-01-11 MED FILL — *IMBRUVICA 140 MG CAPSULE: 140 | 30 days supply | Qty: 90 | Fill #2

## 2016-02-21 MED FILL — IMBRUVICA 140 MG CAPSULE: 140 | 30 days supply | Qty: 90 | Fill #3

## 2016-02-25 ENCOUNTER — Other Ambulatory Visit: Payer: Federal, State, Local not specified - PPO

## 2016-02-25 ENCOUNTER — Ambulatory Visit: Payer: Federal, State, Local not specified - PPO | Admitting: Hematology & Oncology

## 2016-02-28 ENCOUNTER — Other Ambulatory Visit: Payer: Federal, State, Local not specified - PPO

## 2016-02-28 ENCOUNTER — Ambulatory Visit: Payer: Federal, State, Local not specified - PPO | Admitting: Hematology & Oncology

## 2016-03-16 ENCOUNTER — Other Ambulatory Visit (HOSPITAL_BASED_OUTPATIENT_CLINIC_OR_DEPARTMENT_OTHER): Payer: Federal, State, Local not specified - PPO

## 2016-03-16 ENCOUNTER — Ambulatory Visit (HOSPITAL_BASED_OUTPATIENT_CLINIC_OR_DEPARTMENT_OTHER): Payer: Federal, State, Local not specified - PPO | Admitting: Hematology & Oncology

## 2016-03-16 VITALS — BP 142/97 | HR 96 | Temp 98.6°F | Resp 20 | Wt 114.0 lb

## 2016-03-16 DIAGNOSIS — C880 Waldenstrom macroglobulinemia: Secondary | ICD-10-CM

## 2016-03-16 DIAGNOSIS — M810 Age-related osteoporosis without current pathological fracture: Secondary | ICD-10-CM | POA: Insufficient documentation

## 2016-03-16 DIAGNOSIS — D801 Nonfamilial hypogammaglobulinemia: Secondary | ICD-10-CM | POA: Diagnosis not present

## 2016-03-16 DIAGNOSIS — J329 Chronic sinusitis, unspecified: Secondary | ICD-10-CM

## 2016-03-16 LAB — COMPREHENSIVE METABOLIC PANEL
ALT: 19 U/L (ref 0–55)
ANION GAP: 11 meq/L (ref 3–11)
AST: 24 U/L (ref 5–34)
Albumin: 3.8 g/dL (ref 3.5–5.0)
Alkaline Phosphatase: 62 U/L (ref 40–150)
BUN: 5.2 mg/dL — ABNORMAL LOW (ref 7.0–26.0)
CHLORIDE: 101 meq/L (ref 98–109)
CO2: 27 meq/L (ref 22–29)
CREATININE: 0.7 mg/dL (ref 0.6–1.1)
Calcium: 8.8 mg/dL (ref 8.4–10.4)
EGFR: 89 mL/min/{1.73_m2} — AB (ref >90)
Glucose: 88 mg/dl (ref 70–140)
Potassium: 3.8 mEq/L (ref 3.5–5.1)
Sodium: 139 mEq/L (ref 136–145)
Total Bilirubin: 0.57 mg/dL (ref 0.20–1.20)
Total Protein: 6.5 g/dL (ref 6.4–8.3)

## 2016-03-16 LAB — CBC WITH DIFFERENTIAL (CANCER CENTER ONLY)
BASO#: 0 10*3/uL (ref 0.0–0.2)
BASO%: 0.2 % (ref 0.0–2.0)
EOS%: 0.8 % (ref 0.0–7.0)
Eosinophils Absolute: 0 10*3/uL (ref 0.0–0.5)
HCT: 37 % (ref 34.8–46.6)
HGB: 12.2 g/dL (ref 11.6–15.9)
LYMPH#: 1.2 10*3/uL (ref 0.9–3.3)
LYMPH%: 23.1 % (ref 14.0–48.0)
MCH: 35.2 pg — ABNORMAL HIGH (ref 26.0–34.0)
MCHC: 33 g/dL (ref 32.0–36.0)
MCV: 107 fL — AB (ref 81–101)
MONO#: 0.4 10*3/uL (ref 0.1–0.9)
MONO%: 8 % (ref 0.0–13.0)
NEUT#: 3.6 10*3/uL (ref 1.5–6.5)
NEUT%: 67.9 % (ref 39.6–80.0)
PLATELETS: 91 10*3/uL — AB (ref 145–400)
RBC: 3.47 10*6/uL — ABNORMAL LOW (ref 3.70–5.32)
RDW: 12.8 % (ref 11.1–15.7)
WBC: 5.2 10*3/uL (ref 3.9–10.0)

## 2016-03-16 MED ORDER — VITAMIN D3 1.25 MG (50000 UT) PO TABS: 1.0000 | ORAL_TABLET | ORAL | 3 refills | Status: DC

## 2016-03-16 MED ORDER — AMOXICILLIN-POT CLAVULANATE 875-125 MG PO TABS: 1.0000 | ORAL_TABLET | Freq: Two times a day (BID) | ORAL | 0 refills | Status: DC

## 2016-03-16 MED FILL — AMOX-CLAV 875-125 MG TABLET: 875-125 | 10 days supply | Qty: 20 | Fill #0

## 2016-03-16 MED FILL — CYANOCOBALAMIN 1,000 MCG/ML: 1000 | 60 days supply | Qty: 1 | Fill #0

## 2016-03-16 MED FILL — VIT D2 1.25 MG (50,000 UNIT: 1.25 MG | 84 days supply | Qty: 12 | Fill #0

## 2016-03-17 ENCOUNTER — Other Ambulatory Visit: Payer: Self-pay | Admitting: Hematology & Oncology

## 2016-03-17 ENCOUNTER — Encounter: Payer: Self-pay | Admitting: Hematology & Oncology

## 2016-03-17 DIAGNOSIS — D801 Nonfamilial hypogammaglobulinemia: Secondary | ICD-10-CM

## 2016-03-17 DIAGNOSIS — J0101 Acute recurrent maxillary sinusitis: Secondary | ICD-10-CM

## 2016-03-17 HISTORY — DX: Acute recurrent maxillary sinusitis: J01.01

## 2016-03-17 HISTORY — DX: Nonfamilial hypogammaglobulinemia: D80.1

## 2016-03-17 LAB — IGG, IGA, IGM
IGA/IMMUNOGLOBULIN A, SERUM: 15 mg/dL — AB (ref 87–352)
IGG (IMMUNOGLOBIN G), SERUM: 158 mg/dL — AB (ref 700–1600)
IgM, Qn, Serum: 611 mg/dL — ABNORMAL HIGH (ref 26–217)

## 2016-03-17 NOTE — Progress Notes (Signed)
Hematology and Oncology Follow Up Visit  Kristy [name] [id number] [birth year] 60 y.o. 03/17/2016   Principle Diagnosis:  Waldenstrm's macroglobulinemia Acquired hypogammaglobulinemia-recurrent sinusitis  Current Therapy:   Imbruvica 420 mg PO daily IVIG 40 g IV infusion every month-to start in January 2018    Interim History:  Kristy [name] is here today for a follow-up. She has really had problems with recurrent sinusitis. This really has been a problem for her. This latest episode has lasted about 3 weeks. She is quite congested. It is affecting her at work. She really was unable to enjoy Christmas like she would like.   We will have to see what her IgG and IgA levels are. I would think that they are quite low. If they are, then I think she would be a great candidate for supplemental IVIG area   She's doing okay with the Imbruvica. Her last IgM level was 1166 milligrams per deciliter. This is an improvement.   Her appetite is somewhat decreased because of the recurrent sinusitis.  She's had a little bit of a fever. She's had some occasional bloody discharge from the sinuses.  She's had no diarrhea.   Overall, her performance status is ECOG 1  Medications:  Allergies as of 03/16/2016   No Known Allergies     Medication List       Accurate as of 03/16/16 11:59 PM. Always use your most recent med list.          amoxicillin-clavulanate 875-125 MG tablet Commonly known as:  AUGMENTIN Take 1 tablet by mouth 2 (two) times daily.   IMBRUVICA 140 MG capsul Generic drug:  ibrutinib TAKE 3 CAPSULES (420 MG TOTAL) BY MOUTH DAILY.   VITAMIN B-12 IJ Inject as directed.   Vitamin D3 50000 units Tabs Take 1 tablet by mouth once a week.       Allergies: No Known Allergies  Past Medical History, Surgical history, Social history, and Family History were reviewed and updated.  Review of Systems: All other 10 point review of systems is negative.   Physical Exam:  weight is 114 lb (51.7 kg). Her oral temperature is 98.6 F (37 C). Her blood pressure is 142/97 (abnormal) and her pulse is 96. Her respiration is 20.   Wt Readings from Last 3 Encounters:  03/16/16 114 lb (51.7 kg)  11/26/15 115 lb (52.2 kg)  11/22/15 117 lb (53.1 kg)    Well-developed and well-nourished white female who is petite. Head and neck exam shows no ocular or oral lesions. She has no palpable cervical or supraclavicular lymph nodes. She has some tenderness to palpation over her maxillary sinuses bilaterally. Lungs are clear. Cardiac exam regular rate and rhythm with no murmurs, rubs or bruits. Abdomen is soft. She has good bowel sounds. There is no fluid wave. There is no palpable liver or spleen tip. Back exam shows no tenderness over the spine, ribs or hips. Extremities shows no clubbing, cyanosis or edema. Skin exam shows no rashes, ecchymoses or petechia. Neurological exam shows no focal neurological deficits. Lab Results  Component Value Date   WBC 5.2 03/16/2016   HGB 12.2 03/16/2016   HCT 37.0 03/16/2016   MCV 107 (H) 03/16/2016   PLT 91 (L) 03/16/2016   No results found for: FERRITIN, IRON, TIBC, UIBC, IRONPCTSAT Lab Results  Component Value Date   RETICCTPCT 2.9 (H) 10/30/2014   RBC 3.47 (L) 03/16/2016   RETICCTABS 98.3 10/30/2014   Lab Results  Component Value Date   KPAFRELGTCHN  14.00 (H) 02/15/2015   LAMBDASER 0.19 (L) 02/15/2015   KAPLAMBRATIO 34.77 (H) 11/26/2015   Lab Results  Component Value Date   IGGSERUM 158 (L) 03/16/2016   IGA 17 (L) 02/15/2015   IGMSERUM 611 (H) 03/16/2016   Lab Results  Component Value Date   TOTALPROTELP 6.5 02/15/2015   ALBUMINELP 4.2 02/15/2015   A1GS 0.3 02/15/2015   A2GS 0.5 02/15/2015   BETS 0.4 02/15/2015   BETA2SER 0.2 02/15/2015   GAMS 1.0 02/15/2015   MSPIKE 0.7 (H) 06/24/2015   SPEI * 02/15/2015     Chemistry      Component Value Date/Time   NA 139 03/16/2016 1337   K 3.8 03/16/2016 1337   CL 104  12/14/2014 1405   CO2 27 03/16/2016 1337   BUN 5.2 (L) 03/16/2016 1337   CREATININE 0.7 03/16/2016 1337      Component Value Date/Time   CALCIUM 8.8 03/16/2016 1337   ALKPHOS 62 03/16/2016 1337   AST 24 03/16/2016 1337   ALT 19 03/16/2016 1337   BILITOT 0.57 03/16/2016 1337     Impression and Plan: Kristy [name] is a very pleasant 60 yo white female with history of Waldenstrm's macroglobulinemia. She has done well despite relapses.   Unfortunately, she really is suffering from this recurrent sinusitis. Her IgG level is incredibly low. It is only 150 80 g per deciliter. Her IgA level also is very low at 15 mg/dL.  I really think that she will need supplemental IVIG. I think this will help minimize her recurrences of these sinus infections. They really do cause a lot of inconvenience for her. Antibiotics only work transiently.  I talked her about this. She is agreeable to IVIG infusions. We will go ahead and set this up for a couple weeks.  I'm absolutely impressed about her IgM level. We checked it today, it was down to 611 mg/dL. As such, [name] is helping. I will keep her on the same dose.  I will like to see her back in another 2 months. She will have the IVIG in 2 weeks. Hopefully in 2 months, she will be feeling better.  I spent about 35 minutes with her. Talking to her about the hypogammaglobulinemia and why the IVIG will help her was very important.   [name], MD 12/29/201711:55 AM

## 2016-03-18 ENCOUNTER — Ambulatory Visit (HOSPITAL_BASED_OUTPATIENT_CLINIC_OR_DEPARTMENT_OTHER)
Admission: RE | Admit: 2016-03-18 | Discharge: 2016-03-18 | Disposition: A | Discharge status: Home / Self Care | Payer: Federal, State, Local not specified - PPO | Source: Ambulatory Visit | Attending: Hematology & Oncology | Admitting: Hematology & Oncology

## 2016-03-18 DIAGNOSIS — J323 Chronic sphenoidal sinusitis: Secondary | ICD-10-CM | POA: Insufficient documentation

## 2016-03-18 DIAGNOSIS — J0101 Acute recurrent maxillary sinusitis: Secondary | ICD-10-CM

## 2016-03-18 MED ORDER — IOPAMIDOL (ISOVUE-300) INJECTION 61%
100.0000 mL | Freq: Once | INTRAVENOUS | Status: AC | PRN
  Administered 2016-03-18: 100 mL via INTRAVENOUS

## 2016-03-22 ENCOUNTER — Telehealth: Payer: Self-pay | Admitting: *Deleted

## 2016-03-22 ENCOUNTER — Other Ambulatory Visit: Payer: Self-pay | Admitting: Family

## 2016-03-22 LAB — PROTEIN ELECTROPHORESIS, SERUM, WITH REFLEX
A/G RATIO SPE: 1.6 (ref 0.7–1.7)
Albumin: 3.7 g/dL (ref 2.9–4.4)
Alpha 1: 0.3 g/dL (ref 0.0–0.4)
Alpha 2: 0.7 g/dL (ref 0.4–1.0)
BETA: 0.8 g/dL (ref 0.7–1.3)
GLOBULIN, TOTAL: 2.3 g/dL (ref 2.2–3.9)
Gamma Globulin: 0.6 g/dL (ref 0.4–1.8)
INTERPRETATION(SEE BELOW): 0
M-Spike, %: 0.3 g/dL — ABNORMAL HIGH
TOTAL PROTEIN: 6 g/dL (ref 6.0–8.5)

## 2016-03-22 MED FILL — IMBRUVICA 140 MG CAPSULE: 140 | 30 days supply | Qty: 90 | Fill #0

## 2016-03-22 NOTE — Telephone Encounter (Addendum)
Patient aware of results  ----- Message from [name], MD sent at 03/21/2016  9:06 AM EST ----- Call - there is infection in the sinus cavities.  The IVIG can definitely help this!!  Let us know if you want to take IVIG.  Kristy [name]

## 2016-03-24 ENCOUNTER — Ambulatory Visit (HOSPITAL_BASED_OUTPATIENT_CLINIC_OR_DEPARTMENT_OTHER): Payer: Federal, State, Local not specified - PPO

## 2016-03-24 ENCOUNTER — Other Ambulatory Visit: Payer: Self-pay | Admitting: *Deleted

## 2016-03-24 VITALS — BP 111/51 | HR 91 | Temp 98.0°F | Resp 16

## 2016-03-24 DIAGNOSIS — D801 Nonfamilial hypogammaglobulinemia: Secondary | ICD-10-CM | POA: Diagnosis not present

## 2016-03-24 DIAGNOSIS — C880 Waldenstrom macroglobulinemia: Secondary | ICD-10-CM

## 2016-03-24 DIAGNOSIS — M810 Age-related osteoporosis without current pathological fracture: Secondary | ICD-10-CM

## 2016-03-24 DIAGNOSIS — J0101 Acute recurrent maxillary sinusitis: Secondary | ICD-10-CM

## 2016-03-24 MED ORDER — DIPHENHYDRAMINE HCL 25 MG PO CAPS
ORAL_CAPSULE | ORAL | Status: AC
  Filled 2016-03-24: qty 1

## 2016-03-24 MED ORDER — SODIUM CHLORIDE 0.9 % IV SOLN
Freq: Once | INTRAVENOUS | Status: AC
  Administered 2016-03-24: 09:00:00 via INTRAVENOUS

## 2016-03-24 MED ORDER — ACETAMINOPHEN 325 MG PO TABS
650.0000 mg | ORAL_TABLET | Freq: Once | ORAL | Status: AC
  Administered 2016-03-24: 650 mg via ORAL

## 2016-03-24 MED ORDER — AMOXICILLIN-POT CLAVULANATE 875-125 MG PO TABS: 1.0000 | ORAL_TABLET | Freq: Two times a day (BID) | ORAL | 0 refills | Status: DC

## 2016-03-24 MED ORDER — DIPHENHYDRAMINE HCL 25 MG PO TABS
25.0000 mg | ORAL_TABLET | Freq: Once | ORAL | Status: AC
  Administered 2016-03-24: 25 mg via ORAL
  Filled 2016-03-24: qty 1

## 2016-03-24 MED ORDER — IMMUNE GLOBULIN (HUMAN) 20 GM/200ML IV SOLN
400.0000 mg/kg | Freq: Once | INTRAVENOUS | Status: AC
  Administered 2016-03-24: 20 g via INTRAVENOUS
  Filled 2016-03-24: qty 200

## 2016-03-24 MED ORDER — ACETAMINOPHEN 325 MG PO TABS
ORAL_TABLET | ORAL | Status: AC
  Filled 2016-03-24: qty 2

## 2016-03-24 MED FILL — AMOX-CLAV 875-125 MG TABLET: 875-125 | 7 days supply | Qty: 14 | Fill #0

## 2016-03-24 NOTE — Patient Instructions (Signed)

## 2016-04-21 ENCOUNTER — Other Ambulatory Visit (HOSPITAL_BASED_OUTPATIENT_CLINIC_OR_DEPARTMENT_OTHER): Payer: Federal, State, Local not specified - PPO

## 2016-04-21 ENCOUNTER — Ambulatory Visit (HOSPITAL_BASED_OUTPATIENT_CLINIC_OR_DEPARTMENT_OTHER): Payer: Federal, State, Local not specified - PPO | Admitting: Family

## 2016-04-21 ENCOUNTER — Ambulatory Visit (HOSPITAL_BASED_OUTPATIENT_CLINIC_OR_DEPARTMENT_OTHER): Payer: Federal, State, Local not specified - PPO

## 2016-04-21 VITALS — BP 125/74 | HR 90 | Temp 98.0°F | Wt 112.0 lb

## 2016-04-21 DIAGNOSIS — M810 Age-related osteoporosis without current pathological fracture: Secondary | ICD-10-CM

## 2016-04-21 DIAGNOSIS — C880 Waldenstrom macroglobulinemia: Secondary | ICD-10-CM

## 2016-04-21 DIAGNOSIS — D801 Nonfamilial hypogammaglobulinemia: Secondary | ICD-10-CM

## 2016-04-21 DIAGNOSIS — J3489 Other specified disorders of nose and nasal sinuses: Secondary | ICD-10-CM | POA: Diagnosis not present

## 2016-04-21 DIAGNOSIS — J0101 Acute recurrent maxillary sinusitis: Secondary | ICD-10-CM

## 2016-04-21 DIAGNOSIS — R05 Cough: Secondary | ICD-10-CM

## 2016-04-21 DIAGNOSIS — R51 Headache: Secondary | ICD-10-CM

## 2016-04-21 LAB — CBC WITH DIFFERENTIAL (CANCER CENTER ONLY)
BASO#: 0 10*3/uL (ref 0.0–0.2)
BASO%: 0.2 % (ref 0.0–2.0)
EOS ABS: 0.1 10*3/uL (ref 0.0–0.5)
EOS%: 1.3 % (ref 0.0–7.0)
HEMATOCRIT: 39.4 % (ref 34.8–46.6)
HGB: 13 g/dL (ref 11.6–15.9)
LYMPH#: 1.4 10*3/uL (ref 0.9–3.3)
LYMPH%: 28.3 % (ref 14.0–48.0)
MCH: 34.8 pg — ABNORMAL HIGH (ref 26.0–34.0)
MCHC: 33 g/dL (ref 32.0–36.0)
MCV: 105 fL — AB (ref 81–101)
MONO#: 0.4 10*3/uL (ref 0.1–0.9)
MONO%: 8.3 % (ref 0.0–13.0)
NEUT#: 3 10*3/uL (ref 1.5–6.5)
NEUT%: 61.9 % (ref 39.6–80.0)
RBC: 3.74 10*6/uL (ref 3.70–5.32)
RDW: 12.8 % (ref 11.1–15.7)
WBC: 4.8 10*3/uL (ref 3.9–10.0)

## 2016-04-21 LAB — CMP (CANCER CENTER ONLY)
ALT(SGPT): 29 U/L (ref 10–47)
AST: 27 U/L (ref 11–38)
Albumin: 4.1 g/dL (ref 3.3–5.5)
Alkaline Phosphatase: 58 U/L (ref 26–84)
BUN: 6 mg/dL — AB (ref 7–22)
CALCIUM: 8.7 mg/dL (ref 8.0–10.3)
CHLORIDE: 102 meq/L (ref 98–108)
CO2: 29 mEq/L (ref 18–33)
Creat: 0.9 mg/dl (ref 0.6–1.2)
GLUCOSE: 86 mg/dL (ref 73–118)
POTASSIUM: 3.9 meq/L (ref 3.3–4.7)
Sodium: 140 mEq/L (ref 128–145)
Total Bilirubin: 1.4 mg/dl (ref 0.20–1.60)
Total Protein: 6 g/dL — ABNORMAL LOW (ref 6.4–8.1)

## 2016-04-21 MED ORDER — ACETAMINOPHEN 325 MG PO TABS
ORAL_TABLET | ORAL | Status: AC
  Filled 2016-04-21: qty 2

## 2016-04-21 MED ORDER — IMMUNE GLOBULIN (HUMAN) 10 GM/100ML IV SOLN
0.4000 g/kg | Freq: Once | INTRAVENOUS | Status: AC
  Administered 2016-04-21: 20 g via INTRAVENOUS
  Filled 2016-04-21: qty 200

## 2016-04-21 MED ORDER — DIPHENHYDRAMINE HCL 25 MG PO CAPS
25.0000 mg | ORAL_CAPSULE | Freq: Once | ORAL | Status: AC
  Administered 2016-04-21: 25 mg via ORAL

## 2016-04-21 MED ORDER — DIPHENHYDRAMINE HCL 25 MG PO CAPS
ORAL_CAPSULE | ORAL | Status: AC
  Filled 2016-04-21: qty 1

## 2016-04-21 MED ORDER — ACETAMINOPHEN 325 MG PO TABS
650.0000 mg | ORAL_TABLET | Freq: Once | ORAL | Status: AC
  Administered 2016-04-21: 650 mg via ORAL

## 2016-04-21 MED ORDER — IMMUNE GLOBULIN (HUMAN) 5 GM/100ML IV SOLN: 0.4000 g/kg | Freq: Once | INTRAVENOUS | Status: DC

## 2016-04-21 NOTE — Progress Notes (Signed)
Hematology and Oncology Follow Up Visit  Kristy [name] [id number] [birth year] 61 y.o. 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 She states that she is feeling a bit better since receiving IVIG last month. She has completed two rounds of Augmentin. Her cough has improved. She still has some sinus drainage and a slight sinus headache at times but again this is improving.   She states that despite being sick she did not have to miss work and is staying busy at home.  She has tolerated Imbruvica nicely. Her IgM level was 611 in late December. Today's lab results are pending.  No fever, chills, n/v, rash, dizziness, SOB, chest pain, palpitations, abdominal pain, changes in bowel of bladder habits.  No swelling, tenderness, numbness or tingling in her extremities. No c/o joint aches or pains.  She has a good appetite and is staying well hydrated. Her weight is stable.   Medications:  Allergies as of 04/21/2016   No Known Allergies     Medication List       Accurate as of 04/21/16 10:16 AM. Always use your most recent med list.          amoxicillin-clavulanate 875-125 MG tablet Commonly known as:  AUGMENTIN Take 1 tablet by mouth 2 (two) times daily.   cyanocobalamin 1000 MCG/ML injection Commonly known as:  (VITAMIN B-12) Inject 1,000 mcg into the skin every 8 (eight) weeks.   IMBRUVICA 140 MG capsul Generic drug:  ibrutinib TAKE 3 CAPSULES (420 MG TOTAL) BY MOUTH DAILY.   Vitamin D3 50000 units Tabs Take 1 tablet by mouth once a week.       Allergies: No Known Allergies  Past Medical History, Surgical history, Social history, and Family History were reviewed and updated.  Review of Systems: All other 10 point review of systems is negative.    Physical Exam:  weight is 112 lb (50.8 kg). Her oral temperature is 98 F (36.7 C). Her blood pressure is 125/74 and her pulse is 90.   Wt Readings from Last 3 Encounters:  04/21/16 112 lb (50.8 kg)  03/16/16 114 lb (51.7 kg)  11/26/15 115 lb (52.2 kg)    Ocular: Sclerae unicteric, pupils equal, round and reactive to light Ear-nose-throat: Oropharynx clear, dentition fair Lymphatic: No cervical supraclavicular or axillary adenopathy Lungs no rales or rhonchi, good excursion bilaterally Heart regular rate and rhythm, no murmur appreciated Abd soft, nontender, positive bowel sounds, no liver or spleen tip palpated on exam, no fluid wave MSK no focal spinal tenderness, no joint edema Neuro: non-focal, well-oriented, appropriate affect Breasts: Deferred  Lab Results  Component Value Date   WBC 4.8 04/21/2016   HGB 13.0 04/21/2016   HCT 39.4 04/21/2016   MCV 105 (H) 04/21/2016   PLT 88 Large platelets present (L) 04/21/2016   No results found for: FERRITIN, IRON, TIBC, UIBC, IRONPCTSAT Lab Results  Component Value Date   RETICCTPCT 2.9 (H) 10/30/2014   RBC 3.74 04/21/2016   RETICCTABS 98.3 10/30/2014   Lab Results  Component Value Date   KPAFRELGTCHN 14.00 (H) 02/15/2015   LAMBDASER 0.19 (L) 02/15/2015   KAPLAMBRATIO 34.77 (H) 11/26/2015   Lab Results  Component Value Date   IGGSERUM 158 (L) 03/16/2016   IGA 17 (L) 02/15/2015   IGMSERUM 611 (H)  03/16/2016   Lab Results  Component Value Date   TOTALPROTELP 6.5 02/15/2015   ALBUMINELP 4.2 02/15/2015   A1GS 0.3 02/15/2015   A2GS 0.5 02/15/2015   BETS 0.4 02/15/2015   BETA2SER 0.2 02/15/2015   GAMS 1.0 02/15/2015   MSPIKE 0.3 (H) 03/16/2016   SPEI * 02/15/2015     Chemistry      Component Value Date/Time   NA 140 04/21/2016 0944   NA 139 03/16/2016 1337   K 3.9 04/21/2016 0944   K 3.8 03/16/2016 1337   CL 102 04/21/2016 0944   CO2 29 04/21/2016 0944   CO2 27 03/16/2016 1337   BUN 6 (L) 04/21/2016 0944    BUN 5.2 (L) 03/16/2016 1337   CREATININE 0.9 04/21/2016 0944   CREATININE 0.7 03/16/2016 1337      Component Value Date/Time   CALCIUM 8.7 04/21/2016 0944   CALCIUM 8.8 03/16/2016 1337   ALKPHOS 58 04/21/2016 0944   ALKPHOS 62 03/16/2016 1337   AST 27 04/21/2016 0944   AST 24 03/16/2016 1337   ALT 29 04/21/2016 0944   ALT 19 03/16/2016 1337   BILITOT 1.40 04/21/2016 0944   BILITOT 0.57 03/16/2016 1337     Impression and Plan: Kristy [name] is a very pleasant 61 yo white female with history of Waldenstrm's macroglobulinemia. She continues to tolerate Imbruvica nicely IgM in December was 611 mg/dl. Result from today's lab work are pending.  She will continue on her same dose of Imbruvica at 420 mg PO daily.  She has developed acquired hypogammaglobulinemia. IgG level last month was 158.  We will proceed with with IVIG infusion today as planned. We will see what today's protein studies show and continue her on monthly infusion if needed.  We will go ahead and plan to see her back in 2 months for labs and follow-up.  She knows to contact us with any questions or concerns. We can certainly see her sooner if need be.   Eliezer Bottom, NP 2/2/201810:16 AM

## 2016-04-21 NOTE — Patient Instructions (Signed)

## 2016-04-22 LAB — IGG, IGA, IGM
IGG (IMMUNOGLOBIN G), SERUM: 468 mg/dL — AB (ref 700–1600)
IGM (IMMUNOGLOBIN M), SRM: 835 mg/dL — AB (ref 26–217)
IgA, Qn, Serum: 15 mg/dL — ABNORMAL LOW (ref 87–352)

## 2016-04-24 MED FILL — IMBRUVICA 140 MG CAPSULE: 140 | 30 days supply | Qty: 90 | Fill #1

## 2016-05-11 IMAGING — MG MM DIGITAL SCREENING BILATERAL
5 series · 5 of 5 positions shown · non-contrast
Comparison: None.

CLINICAL DATA: Screening.

EXAM:
DIGITAL SCREENING BILATERAL MAMMOGRAM WITH CAD

[R MLO]
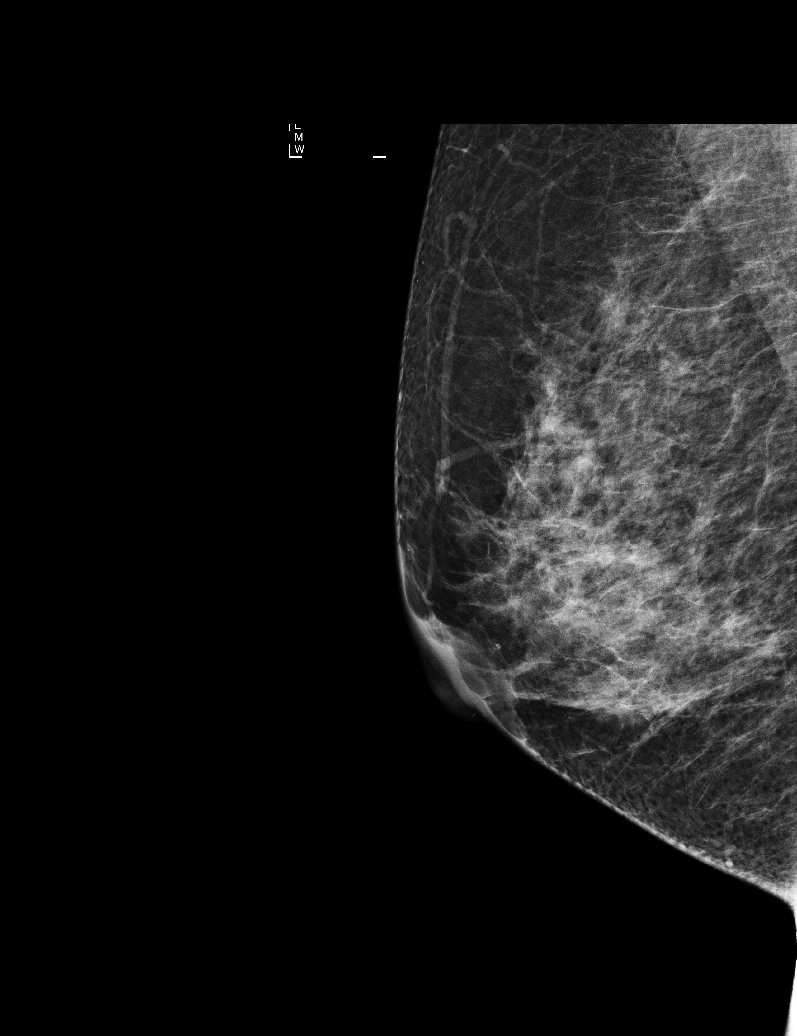

[L MLO]
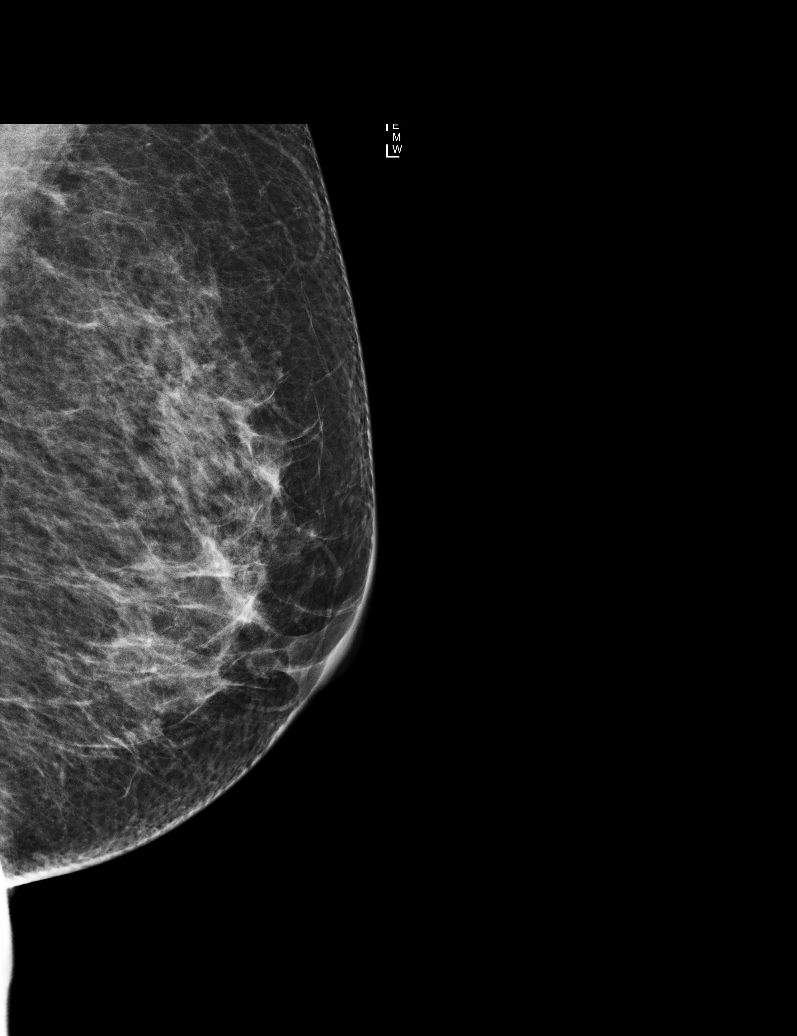

[R CC]
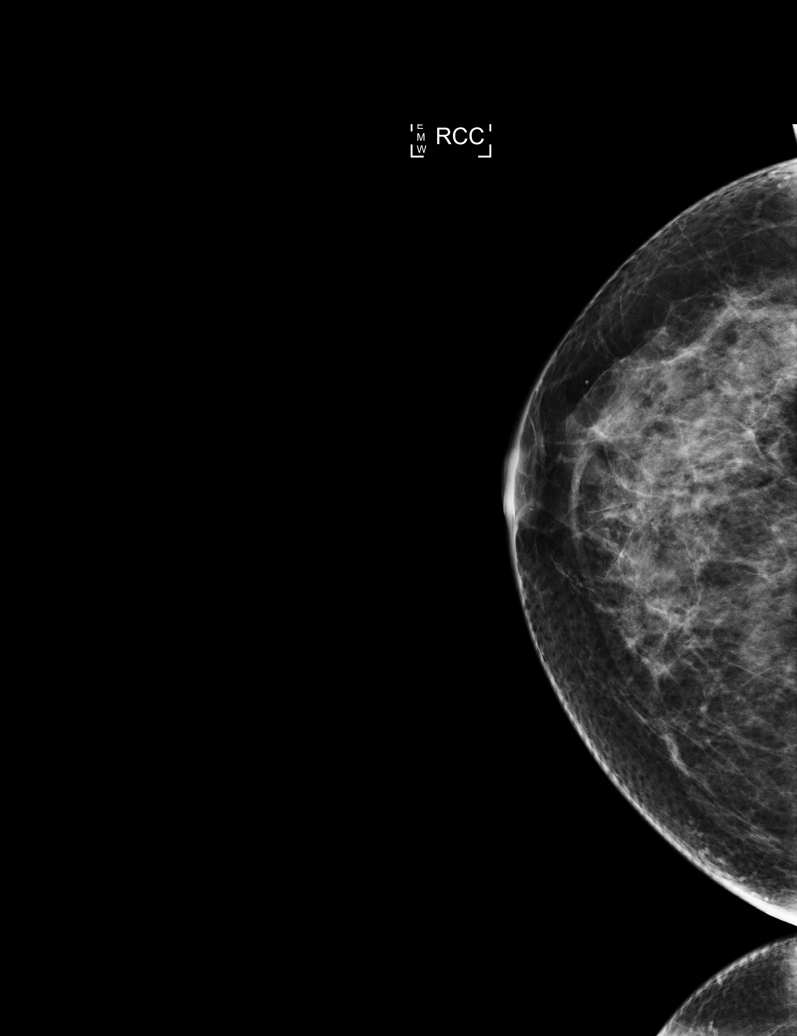

[R XCCL]
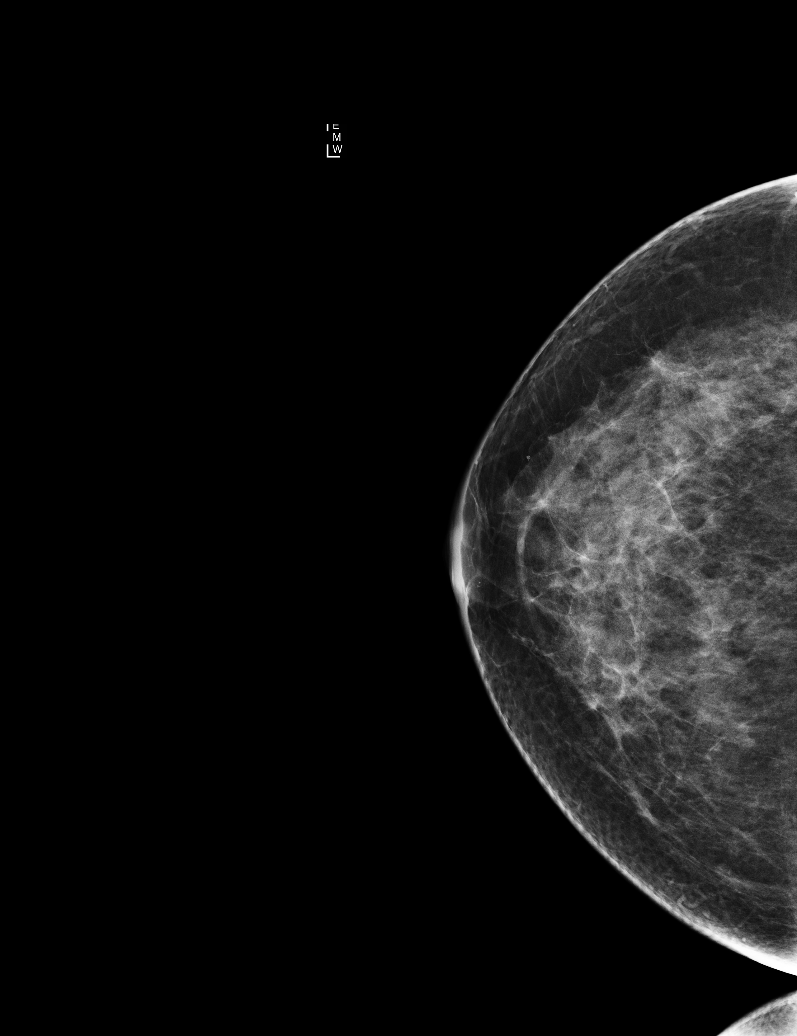

[L CC]
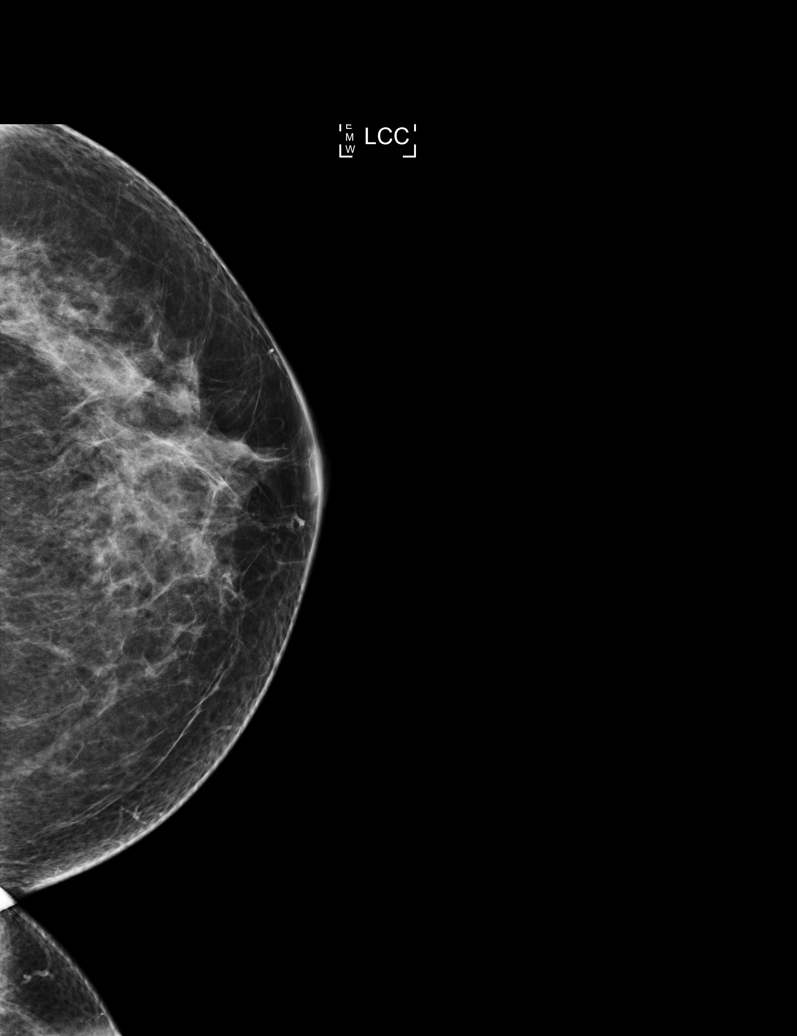

[5 of 5 positions shown; findings below may reference images not displayed]

ACR Breast Density Category b: There are scattered areas of
fibroglandular density.
FINDINGS: There are no findings suspicious for malignancy. Images were
processed with CAD.
IMPRESSION: No mammographic evidence of malignancy. A result letter of this
screening mammogram will be mailed directly to the patient.

RECOMMENDATION:
Screening mammogram in one year. (Code:[id number])

BI-RADS CATEGORY  1: Negative.

## 2016-05-19 ENCOUNTER — Ambulatory Visit (HOSPITAL_BASED_OUTPATIENT_CLINIC_OR_DEPARTMENT_OTHER): Payer: Federal, State, Local not specified - PPO

## 2016-05-19 ENCOUNTER — Other Ambulatory Visit (HOSPITAL_BASED_OUTPATIENT_CLINIC_OR_DEPARTMENT_OTHER): Payer: Federal, State, Local not specified - PPO

## 2016-05-19 ENCOUNTER — Other Ambulatory Visit: Payer: Federal, State, Local not specified - PPO

## 2016-05-19 ENCOUNTER — Ambulatory Visit: Payer: Federal, State, Local not specified - PPO | Admitting: Hematology & Oncology

## 2016-05-19 ENCOUNTER — Ambulatory Visit (HOSPITAL_BASED_OUTPATIENT_CLINIC_OR_DEPARTMENT_OTHER): Payer: Federal, State, Local not specified - PPO | Admitting: Hematology & Oncology

## 2016-05-19 VITALS — BP 118/65 | HR 97 | Temp 97.5°F | Resp 16 | Wt 113.0 lb

## 2016-05-19 VITALS — BP 120/62 | Temp 97.5°F | Resp 16

## 2016-05-19 DIAGNOSIS — D801 Nonfamilial hypogammaglobulinemia: Secondary | ICD-10-CM

## 2016-05-19 DIAGNOSIS — C880 Waldenstrom macroglobulinemia: Secondary | ICD-10-CM

## 2016-05-19 DIAGNOSIS — J0101 Acute recurrent maxillary sinusitis: Secondary | ICD-10-CM

## 2016-05-19 LAB — CMP (CANCER CENTER ONLY)
ALBUMIN: 4.1 g/dL (ref 3.3–5.5)
ALK PHOS: 58 U/L (ref 26–84)
ALT: 21 U/L (ref 10–47)
AST: 25 U/L (ref 11–38)
BUN, Bld: 7 mg/dL (ref 7–22)
CALCIUM: 9.3 mg/dL (ref 8.0–10.3)
CO2: 27 mEq/L (ref 18–33)
Chloride: 107 mEq/L (ref 98–108)
Creat: 0.8 mg/dl (ref 0.6–1.2)
GLUCOSE: 106 mg/dL (ref 73–118)
Potassium: 3.9 mEq/L (ref 3.3–4.7)
Sodium: 143 mEq/L (ref 128–145)
Total Bilirubin: 1.2 mg/dl (ref 0.20–1.60)
Total Protein: 6.4 g/dL (ref 6.4–8.1)

## 2016-05-19 LAB — CBC WITH DIFFERENTIAL (CANCER CENTER ONLY)
BASO#: 0 10*3/uL (ref 0.0–0.2)
BASO%: 0.2 % (ref 0.0–2.0)
EOS%: 0.8 % (ref 0.0–7.0)
Eosinophils Absolute: 0 10*3/uL (ref 0.0–0.5)
HEMATOCRIT: 36.6 % (ref 34.8–46.6)
HEMOGLOBIN: 12.5 g/dL (ref 11.6–15.9)
LYMPH#: 1.1 10*3/uL (ref 0.9–3.3)
LYMPH%: 22.5 % (ref 14.0–48.0)
MCH: 35 pg — ABNORMAL HIGH (ref 26.0–34.0)
MCHC: 34.2 g/dL (ref 32.0–36.0)
MCV: 103 fL — ABNORMAL HIGH (ref 81–101)
MONO#: 0.4 10*3/uL (ref 0.1–0.9)
MONO%: 7.2 % (ref 0.0–13.0)
NEUT#: 3.4 10*3/uL (ref 1.5–6.5)
NEUT%: 69.3 % (ref 39.6–80.0)
Platelets: 92 10*3/uL — ABNORMAL LOW (ref 145–400)
RBC: 3.57 10*6/uL — ABNORMAL LOW (ref 3.70–5.32)
RDW: 12.6 % (ref 11.1–15.7)
WBC: 4.9 10*3/uL (ref 3.9–10.0)

## 2016-05-19 MED ORDER — SODIUM CHLORIDE 0.9% FLUSH
3.0000 mL | Freq: Once | INTRAVENOUS | Status: DC | PRN
  Filled 2016-05-19: qty 10

## 2016-05-19 MED ORDER — IMMUNE GLOBULIN (HUMAN) 5 GM/100ML IV SOLN: 40.0000 g | Freq: Once | INTRAVENOUS | Status: DC

## 2016-05-19 MED ORDER — HEPARIN SOD (PORK) LOCK FLUSH 100 UNIT/ML IV SOLN
500.0000 [IU] | Freq: Once | INTRAVENOUS | Status: DC | PRN
  Filled 2016-05-19: qty 5

## 2016-05-19 MED ORDER — DIPHENHYDRAMINE HCL 25 MG PO CAPS
ORAL_CAPSULE | ORAL | Status: AC
  Filled 2016-05-19: qty 1

## 2016-05-19 MED ORDER — ACETAMINOPHEN 325 MG PO TABS
ORAL_TABLET | ORAL | Status: AC
Start: 2016-05-19 — End: 2016-05-19
  Filled 2016-05-19: qty 2

## 2016-05-19 MED ORDER — IMMUNE GLOBULIN (HUMAN) 20 GM/200ML IV SOLN
40.0000 g | Freq: Once | INTRAVENOUS | Status: AC
  Administered 2016-05-19: 40 g via INTRAVENOUS
  Filled 2016-05-19: qty 400

## 2016-05-19 MED ORDER — SODIUM CHLORIDE 0.9% FLUSH
10.0000 mL | INTRAVENOUS | Status: DC | PRN
  Filled 2016-05-19: qty 10

## 2016-05-19 MED ORDER — ALTEPLASE 2 MG IJ SOLR
2.0000 mg | Freq: Once | INTRAMUSCULAR | Status: DC | PRN
  Filled 2016-05-19: qty 2

## 2016-05-19 MED ORDER — ACETAMINOPHEN 325 MG PO TABS
650.0000 mg | ORAL_TABLET | Freq: Once | ORAL | Status: AC
  Administered 2016-05-19: 650 mg via ORAL

## 2016-05-19 MED ORDER — DIPHENHYDRAMINE HCL 25 MG PO CAPS
25.0000 mg | ORAL_CAPSULE | Freq: Once | ORAL | Status: AC
  Administered 2016-05-19: 25 mg via ORAL

## 2016-05-19 MED ORDER — HEPARIN SOD (PORK) LOCK FLUSH 100 UNIT/ML IV SOLN
250.0000 [IU] | Freq: Once | INTRAVENOUS | Status: DC | PRN
  Filled 2016-05-19: qty 5

## 2016-05-19 MED FILL — CYANOCOBALAMIN 1,000 MCG/ML: 1000 | 60 days supply | Qty: 1 | Fill #1

## 2016-05-19 MED FILL — IMBRUVICA 140 MG CAPSULE: 140 | 30 days supply | Qty: 90 | Fill #2

## 2016-05-19 NOTE — Progress Notes (Signed)
Hematology and Oncology Follow Up Visit  RUDY MCCLENNY [id number] [birth year] 61 y.o. 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 Stokley is here today for a follow-up. The IVIG really has helped her sinuses. She feels so much better. She does not have much in way of sinus drainage.  She recently had a UTI. She is on ciprofloxacin.  When last checked her IgM level, it was holding steady at 835 mg/dL. Her IgG level was 468. We started the IVIG, her IgG level was 158 mg/dL.   She has had no fever. She has had no rashes. She's had no cough or shortness of breath.  Her appetite is good. She's had no diarrhea. She's had some slight dysuria.  Her daughter came in with her. Her daughter is actually a nurse over at at [facility] on the oncology unit. He was very nice talking with her.   Overall, her performance status is ECOG 1  Medications:  Allergies as of 05/19/2016   No Known Allergies     Medication List       Accurate as of 05/19/16 10:04 AM. Always use your most recent med list.          cyanocobalamin 1000 MCG/ML injection Commonly known as:  (VITAMIN B-12) Inject 1,000 mcg into the skin every 8 (eight) weeks.   IMBRUVICA 140 MG capsul Generic drug:  ibrutinib TAKE 3 CAPSULES (420 MG TOTAL) BY MOUTH DAILY.   Vitamin D3 50000 units Tabs Take 1 tablet by mouth once a week.       Allergies: No Known Allergies  Past Medical History, Surgical history, Social history, and Family History were reviewed and updated.  Review of Systems: All other 10 point review of systems is negative.   Physical Exam:  weight is 113 lb (51.3 kg). Her temperature is 97.5 F (36.4 C). Her blood pressure is 118/65 and her pulse is 97. Her respiration is 16 and oxygen  saturation is 97%.   Wt Readings from Last 3 Encounters:  05/19/16 113 lb (51.3 kg)  04/21/16 112 lb (50.8 kg)  03/16/16 114 lb (51.7 kg)    Well-developed and well-nourished white female who is petite. Head and neck exam shows no ocular or oral lesions. She has no palpable cervical or supraclavicular lymph nodes. She has some tenderness to palpation over her maxillary sinuses bilaterally. Lungs are clear. Cardiac exam regular rate and rhythm with no murmurs, rubs or bruits. Abdomen is soft. She has good bowel sounds. There is no fluid wave. There is no palpable liver or spleen tip. Back exam shows no tenderness over the spine, ribs or hips. Extremities shows no clubbing, cyanosis or edema. Skin exam shows no rashes, ecchymoses or petechia. Neurological exam shows no focal neurological deficits. Lab Results  Component Value Date   WBC 4.9 05/19/2016   HGB 12.5 05/19/2016   HCT 36.6 05/19/2016   MCV 103 (H) 05/19/2016   PLT 92 (L) 05/19/2016   No results found for: FERRITIN, IRON, TIBC, UIBC, IRONPCTSAT Lab Results  Component Value Date   RETICCTPCT 2.9 (H) 10/30/2014   RBC 3.57 (L) 05/19/2016   RETICCTABS 98.3 10/30/2014   Lab Results  Component Value Date   KPAFRELGTCHN 14.00 (H) 02/15/2015   LAMBDASER 0.19 (L) 02/15/2015   KAPLAMBRATIO 34.77 (H) 11/26/2015  Lab Results  Component Value Date   [id number] (L) 04/21/2016   IGA 17 (L) 02/15/2015   IGMSERUM 835 (H) 04/21/2016   Lab Results  Component Value Date   TOTALPROTELP 6.5 02/15/2015   ALBUMINELP 4.2 02/15/2015   A1GS 0.3 02/15/2015   A2GS 0.5 02/15/2015   BETS 0.4 02/15/2015   BETA2SER 0.2 02/15/2015   GAMS 1.0 02/15/2015   MSPIKE 0.3 (H) 03/16/2016   SPEI * 02/15/2015     Chemistry      Component Value Date/Time   NA 143 05/19/2016 0847   NA 139 03/16/2016 1337   K 3.9 05/19/2016 0847   K 3.8 03/16/2016 1337   CL 107 05/19/2016 0847   CO2 27 05/19/2016 0847   CO2 27 03/16/2016 1337   BUN 7 05/19/2016  0847   BUN 5.2 (L) 03/16/2016 1337   CREATININE 0.8 05/19/2016 0847   CREATININE 0.7 03/16/2016 1337      Component Value Date/Time   CALCIUM 9.3 05/19/2016 0847   CALCIUM 8.8 03/16/2016 1337   ALKPHOS 58 05/19/2016 0847   ALKPHOS 62 03/16/2016 1337   AST 25 05/19/2016 0847   AST 24 03/16/2016 1337   ALT 21 05/19/2016 0847   ALT 19 03/16/2016 1337   BILITOT 1.20 05/19/2016 0847   BILITOT 0.57 03/16/2016 1337     Impression and Plan: Ms. Aydt is a very pleasant 61 yo white female with history of Waldenstrm's macroglobulinemia. She has done well despite relapses.   We will continue her on the IVIG infusions. Again, these are really helping her out.  Hopefully, we will be able to spread the intervals out as we go along. I this would help Ms. Yetter.  We'll see what her IgM level is. She is doing well on the [place].  It was nice talking to her daughter. Again, her daughters is an oncology nurse over at [employer].   I would like to see Ms. Bardsley back in about 6 weeks. I did this would be a good time for follow-up and for her next IVIG infusion.   I spent about 25 minutes with she and her daughter.  .   . [name], MD 3/2/201810:04 AM

## 2016-05-19 NOTE — Patient Instructions (Signed)

## 2016-05-22 LAB — KAPPA/LAMBDA LIGHT CHAINS
Ig Kappa Free Light Chain: 79.4 mg/L — ABNORMAL HIGH (ref 3.3–19.4)
Ig Lambda Free Light Chain: 2.9 mg/L — ABNORMAL LOW (ref 5.7–26.3)
Kappa/Lambda FluidC Ratio: 27.38 — ABNORMAL HIGH (ref 0.26–1.65)

## 2016-05-24 LAB — MULTIPLE MYELOMA PANEL, SERUM
ALBUMIN/GLOB SERPL: 1.7 (ref 0.7–1.7)
Albumin SerPl Elph-Mcnc: 4.2 g/dL (ref 2.9–4.4)
Alpha 1: 0.2 g/dL (ref 0.0–0.4)
Alpha2 Glob SerPl Elph-Mcnc: 0.5 g/dL (ref 0.4–1.0)
B-GLOBULIN SERPL ELPH-MCNC: 0.7 g/dL (ref 0.7–1.3)
GAMMA GLOB SERPL ELPH-MCNC: 1.2 g/dL (ref 0.4–1.8)
GLOBULIN, TOTAL: 2.6 g/dL (ref 2.2–3.9)
IgA, Qn, Serum: 15 mg/dL — ABNORMAL LOW (ref 87–352)
IgG, Qn, Serum: 617 mg/dL — ABNORMAL LOW (ref 700–1600)
IgM, Qn, Serum: 914 mg/dL — ABNORMAL HIGH (ref 26–217)
M PROTEIN SERPL ELPH-MCNC: 0.6 g/dL — AB
Total Protein: 6.8 g/dL (ref 6.0–8.5)

## 2016-05-24 MED FILL — NYSTATIN-TRIAMCINOLONE OINT: 100000-0.1 | 15 days supply | Qty: 30 | Fill #0

## 2016-06-09 MED FILL — VIT D2 1.25 MG (50,000 UNIT: 1.25 MG | 84 days supply | Qty: 12 | Fill #1

## 2016-06-19 MED FILL — IMBRUVICA 140 MG CAPSULE: 140 | 30 days supply | Qty: 90 | Fill #3

## 2016-06-23 ENCOUNTER — Other Ambulatory Visit (HOSPITAL_BASED_OUTPATIENT_CLINIC_OR_DEPARTMENT_OTHER): Payer: Federal, State, Local not specified - PPO

## 2016-06-23 ENCOUNTER — Ambulatory Visit (HOSPITAL_BASED_OUTPATIENT_CLINIC_OR_DEPARTMENT_OTHER): Payer: Federal, State, Local not specified - PPO

## 2016-06-23 ENCOUNTER — Ambulatory Visit (HOSPITAL_BASED_OUTPATIENT_CLINIC_OR_DEPARTMENT_OTHER): Payer: Federal, State, Local not specified - PPO | Admitting: Hematology & Oncology

## 2016-06-23 VITALS — BP 133/80 | HR 75 | Temp 98.0°F | Resp 17 | Wt 113.0 lb

## 2016-06-23 VITALS — BP 126/69 | HR 87 | Temp 97.8°F | Resp 18

## 2016-06-23 DIAGNOSIS — C880 Waldenstrom macroglobulinemia: Secondary | ICD-10-CM

## 2016-06-23 DIAGNOSIS — D801 Nonfamilial hypogammaglobulinemia: Secondary | ICD-10-CM | POA: Diagnosis not present

## 2016-06-23 DIAGNOSIS — J0101 Acute recurrent maxillary sinusitis: Secondary | ICD-10-CM

## 2016-06-23 LAB — CMP (CANCER CENTER ONLY)
ALBUMIN: 4.3 g/dL (ref 3.3–5.5)
ALT(SGPT): 24 U/L (ref 10–47)
AST: 29 U/L (ref 11–38)
Alkaline Phosphatase: 61 U/L (ref 26–84)
BUN, Bld: 6 mg/dL — ABNORMAL LOW (ref 7–22)
CHLORIDE: 103 meq/L (ref 98–108)
CO2: 30 mEq/L (ref 18–33)
CREATININE: 0.7 mg/dL (ref 0.6–1.2)
Calcium: 9.3 mg/dL (ref 8.0–10.3)
Glucose, Bld: 88 mg/dL (ref 73–118)
POTASSIUM: 3.7 meq/L (ref 3.3–4.7)
SODIUM: 141 meq/L (ref 128–145)
TOTAL PROTEIN: 6.6 g/dL (ref 6.4–8.1)
Total Bilirubin: 1.3 mg/dl (ref 0.20–1.60)

## 2016-06-23 LAB — CBC WITH DIFFERENTIAL (CANCER CENTER ONLY)
BASO#: 0 10*3/uL (ref 0.0–0.2)
BASO%: 0.2 % (ref 0.0–2.0)
EOS%: 1.2 % (ref 0.0–7.0)
Eosinophils Absolute: 0.1 10*3/uL (ref 0.0–0.5)
HEMATOCRIT: 37.7 % (ref 34.8–46.6)
HEMOGLOBIN: 12.6 g/dL (ref 11.6–15.9)
LYMPH#: 1.5 10*3/uL (ref 0.9–3.3)
LYMPH%: 29.4 % (ref 14.0–48.0)
MCH: 34.6 pg — ABNORMAL HIGH (ref 26.0–34.0)
MCHC: 33.4 g/dL (ref 32.0–36.0)
MCV: 104 fL — AB (ref 81–101)
MONO#: 0.3 10*3/uL (ref 0.1–0.9)
MONO%: 6 % (ref 0.0–13.0)
NEUT%: 63.2 % (ref 39.6–80.0)
NEUTROS ABS: 3.2 10*3/uL (ref 1.5–6.5)
Platelets: 92 10*3/uL — ABNORMAL LOW (ref 145–400)
RBC: 3.64 10*6/uL — ABNORMAL LOW (ref 3.70–5.32)
RDW: 13 % (ref 11.1–15.7)
WBC: 5 10*3/uL (ref 3.9–10.0)

## 2016-06-23 LAB — TECHNOLOGIST REVIEW CHCC SATELLITE

## 2016-06-23 MED ORDER — IMMUNE GLOBULIN (HUMAN) 20 GM/200ML IV SOLN
40.0000 g | Freq: Once | INTRAVENOUS | Status: AC
  Administered 2016-06-23: 40 g via INTRAVENOUS
  Filled 2016-06-23: qty 400

## 2016-06-23 MED ORDER — ACETAMINOPHEN 325 MG PO TABS
650.0000 mg | ORAL_TABLET | Freq: Once | ORAL | Status: AC
  Administered 2016-06-23: 650 mg via ORAL

## 2016-06-23 MED ORDER — DIPHENHYDRAMINE HCL 25 MG PO CAPS
25.0000 mg | ORAL_CAPSULE | Freq: Once | ORAL | Status: AC
  Administered 2016-06-23: 25 mg via ORAL

## 2016-06-23 MED ORDER — DIPHENHYDRAMINE HCL 25 MG PO CAPS
ORAL_CAPSULE | ORAL | Status: AC
  Filled 2016-06-23: qty 1

## 2016-06-23 MED ORDER — ACETAMINOPHEN 325 MG PO TABS
ORAL_TABLET | ORAL | Status: AC
  Filled 2016-06-23: qty 2

## 2016-06-23 NOTE — Patient Instructions (Signed)

## 2016-06-23 NOTE — Progress Notes (Signed)
Pt aware of policy to observe pt 30 minutes post infusion for possible reaction. Pt stated she did not want to stay for observation. Pt verbalized understanding of risks and signs of possible reaction. Pt left ambulatory in no apparent distress.

## 2016-06-23 NOTE — Progress Notes (Signed)
Hematology and Oncology Follow Up Visit  Kristy [name] [id number] 1955-05-24 61 y.o. 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 The IVIG really has helped her sinuses. She feels so much better. She does not have much in way of sinus drainage. Now that the pollen started become more of a problem, she has noted a bit of congestion but not nearly as bad.  When last checked her IgM level, it was holding steady at 914 mg/dL. Her IgG level was 617 mg/dL.  We started the IVIG, her IgG level was 158 mg/dL.   Her M spike is holding steady at 0.6 g/dL.  She has tolerated the Imbruvica quite well. She really has had very little, if any, side effects from this.  She has had no fever. She has had no rashes. She's had no cough or shortness of breath.  Her appetite is good. She's had no diarrhea. She's had some slight dysuria.  Overall, her performance status is ECOG 1  Medications:  Allergies as of 06/23/2016   No Known Allergies     Medication List       Accurate as of 06/23/16  9:52 AM. Always use your most recent med list.          cyanocobalamin 1000 MCG/ML injection Commonly known as:  (VITAMIN B-12) Inject 1,000 mcg into the skin every 8 (eight) weeks.   IMBRUVICA 140 MG tablet Generic drug:  ibrutinib TAKE 3 CAPSULES (420 MG TOTAL) BY MOUTH DAILY.   Vitamin D3 50000 units Tabs Take 1 tablet by mouth once a week.       Allergies: No Known Allergies  Past Medical History, Surgical history, Social history, and Family History were reviewed and updated.  Review of Systems: All other 10 point review of systems is negative.   Physical Exam:  weight is 113 lb (51.3 kg). Her oral temperature is 98 F (36.7 C). Her blood pressure is 133/80 and her  pulse is 75. Her respiration is 17 and oxygen saturation is 100%.   Wt Readings from Last 3 Encounters:  06/23/16 113 lb (51.3 kg)  05/19/16 113 lb (51.3 kg)  04/21/16 112 lb (50.8 kg)    Well-developed and well-nourished white female who is petite. Head and neck exam shows no ocular or oral lesions. She has no palpable cervical or supraclavicular lymph nodes. She has some tenderness to palpation over her maxillary sinuses bilaterally. Lungs are clear. Cardiac exam regular rate and rhythm with no murmurs, rubs or bruits. Abdomen is soft. She has good bowel sounds. There is no fluid wave. There is no palpable liver or spleen tip. Back exam shows no tenderness over the spine, ribs or hips. Extremities shows no clubbing, cyanosis or edema. Skin exam shows no rashes, ecchymoses or petechia. Neurological exam shows no focal neurological deficits. Lab Results  Component Value Date   WBC 5.0 06/23/2016   HGB 12.6 06/23/2016   HCT 37.7 06/23/2016   MCV 104 (H) 06/23/2016   PLT 92 (L) 06/23/2016   No results found for: FERRITIN, IRON, TIBC, UIBC, IRONPCTSAT Lab Results  Component Value Date   RETICCTPCT 2.9 (H) 10/30/2014   RBC 3.64 (L) 06/23/2016   RETICCTABS 98.3 10/30/2014   Lab Results  Component Value Date   KPAFRELGTCHN 14.00 (H) 02/15/2015  LAMBDASER 0.19 (L) 02/15/2015   KAPLAMBRATIO 27.38 (H) 05/19/2016   Lab Results  Component Value Date   IGGSERUM 617 (L) 05/19/2016   IGA 17 (L) 02/15/2015   IGMSERUM 914 (H) 05/19/2016   Lab Results  Component Value Date   TOTALPROTELP 6.5 02/15/2015   ALBUMINELP 4.2 02/15/2015   A1GS 0.3 02/15/2015   A2GS 0.5 02/15/2015   BETS 0.4 02/15/2015   BETA2SER 0.2 02/15/2015   GAMS 1.0 02/15/2015   MSPIKE 0.3 (H) 03/16/2016   SPEI * 02/15/2015     Chemistry      Component Value Date/Time   NA 141 06/23/2016 0856   NA 139 03/16/2016 1337   K 3.7 06/23/2016 0856   K 3.8 03/16/2016 1337   CL 103 06/23/2016 0856   CO2 30 06/23/2016  0856   CO2 27 03/16/2016 1337   BUN 6 (L) 06/23/2016 0856   BUN 5.2 (L) 03/16/2016 1337   CREATININE 0.7 06/23/2016 0856   CREATININE 0.7 03/16/2016 1337      Component Value Date/Time   CALCIUM 9.3 06/23/2016 0856   CALCIUM 8.8 03/16/2016 1337   ALKPHOS 61 06/23/2016 0856   ALKPHOS 62 03/16/2016 1337   AST 29 06/23/2016 0856   AST 24 03/16/2016 1337   ALT 24 06/23/2016 0856   ALT 19 03/16/2016 1337   BILITOT 1.30 06/23/2016 0856   BILITOT 0.57 03/16/2016 1337     Impression and Plan: Kristy [name] is a very pleasant 31 year yo white female with history of Waldenstrm's macroglobulinemia. She has done well despite relapses.   Her IgM level is going up a little bit. Some of this might be from the IVIG. I am following her M spike as a gauge as whether or not we have to make any changes with the Imbruvica.  We will continue her on the IVIG infusions. Again, these are really helping her out.  Her son came in with her today. He and his wife are expecting their first child in June. This is very exciting.  I will plan to get her back to see [place] in another 6 weeks. I think we can probably move her appointments out a little bit longer.  I spent about 25 minutes with she and her son.  [name], MD 4/6/20189:52 AM

## 2016-06-24 LAB — IGG, IGA, IGM
IgA, Qn, Serum: 16 mg/dL — ABNORMAL LOW (ref 87–352)
IgG, Qn, Serum: 696 mg/dL — ABNORMAL LOW (ref 700–1600)
IgM, Qn, Serum: 891 mg/dL — ABNORMAL HIGH (ref 26–217)

## 2016-06-26 ENCOUNTER — Telehealth: Payer: Self-pay | Admitting: *Deleted

## 2016-06-26 LAB — KAPPA/LAMBDA LIGHT CHAINS
IG KAPPA FREE LIGHT CHAIN: 81.3 mg/L — AB (ref 3.3–19.4)
Ig Lambda Free Light Chain: 2.9 mg/L — ABNORMAL LOW (ref 5.7–26.3)
KAPPA/LAMBDA FLC RATIO: 28.03 — AB (ref 0.26–1.65)

## 2016-06-26 NOTE — Telephone Encounter (Addendum)
Patient is aware of results.   ----- Message from [name], MD sent at 06/24/2016  2:20 PM EDT ----- Call - IgM level is holding steady!!  Kristy [name]

## 2016-06-29 LAB — PROTEIN ELECTROPHORESIS, SERUM, WITH REFLEX
A/G Ratio: 1.3 (ref 0.7–1.7)
ALPHA 1: 0.2 g/dL (ref 0.0–0.4)
Albumin: 3.9 g/dL (ref 2.9–4.4)
Alpha 2: 0.5 g/dL (ref 0.4–1.0)
Beta: 0.9 g/dL (ref 0.7–1.3)
GLOBULIN, TOTAL: 2.9 g/dL (ref 2.2–3.9)
Gamma Globulin: 1.3 g/dL (ref 0.4–1.8)
INTERPRETATION(SEE BELOW): 0
M-SPIKE, %: 0.7 g/dL — AB
TOTAL PROTEIN: 6.8 g/dL (ref 6.0–8.5)

## 2016-07-06 IMAGING — DX DG LUMBAR SPINE COMPLETE 4+V
5 series · 5 of 5 positions shown · non-contrast
Comparison: CT scan of the abdomen and pelvis of April 15, 2013

CLINICAL DATA: Low back pain with right lower extremity radicular
symptoms for the past 2 months following moving furniture.

EXAM:
LUMBAR SPINE - COMPLETE 4+ VIEW

[l-spine ap]
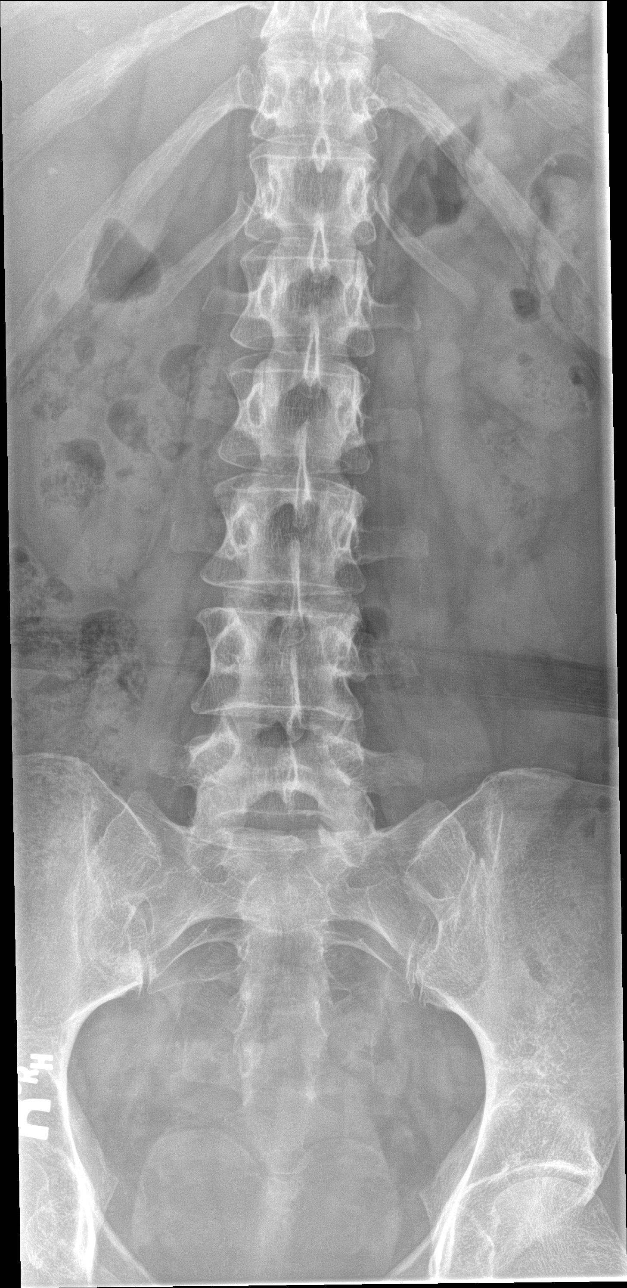

[l-spine obl (1 of 2)]
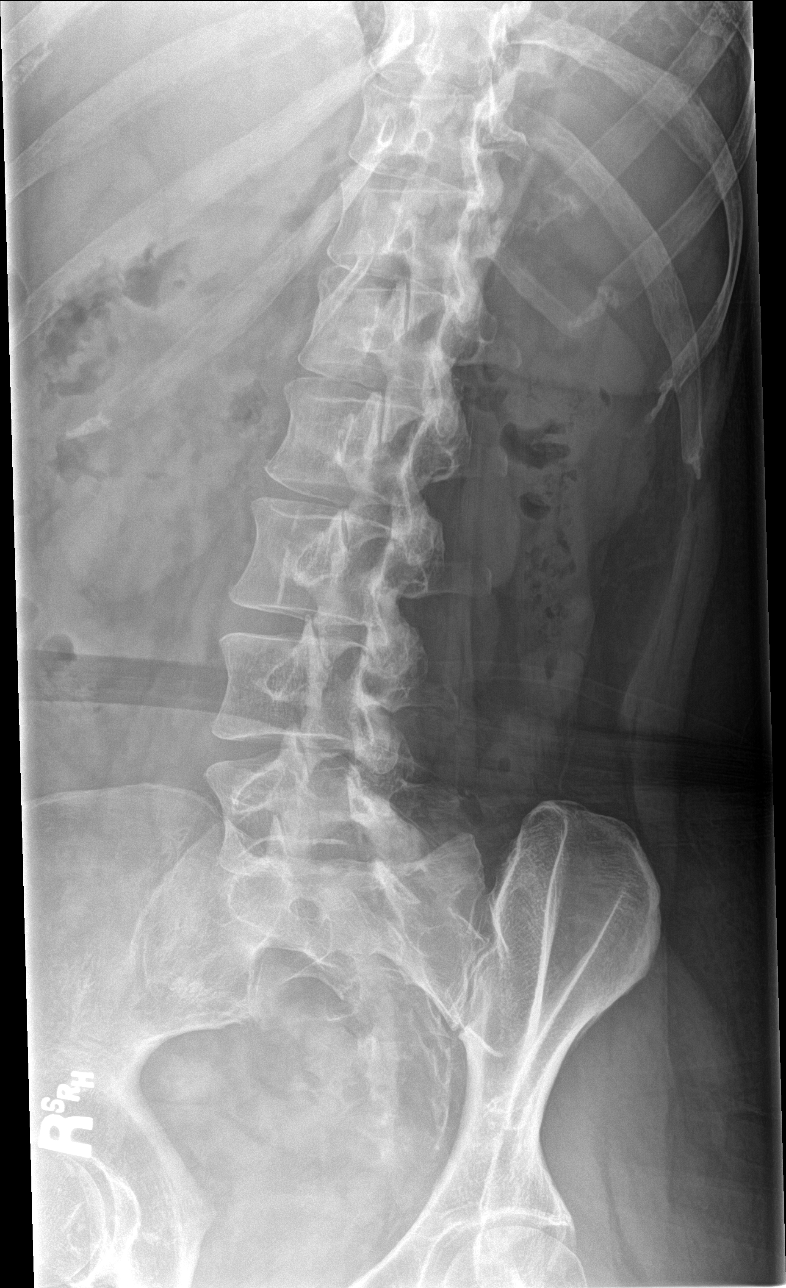

[l-spine obl (2 of 2)]
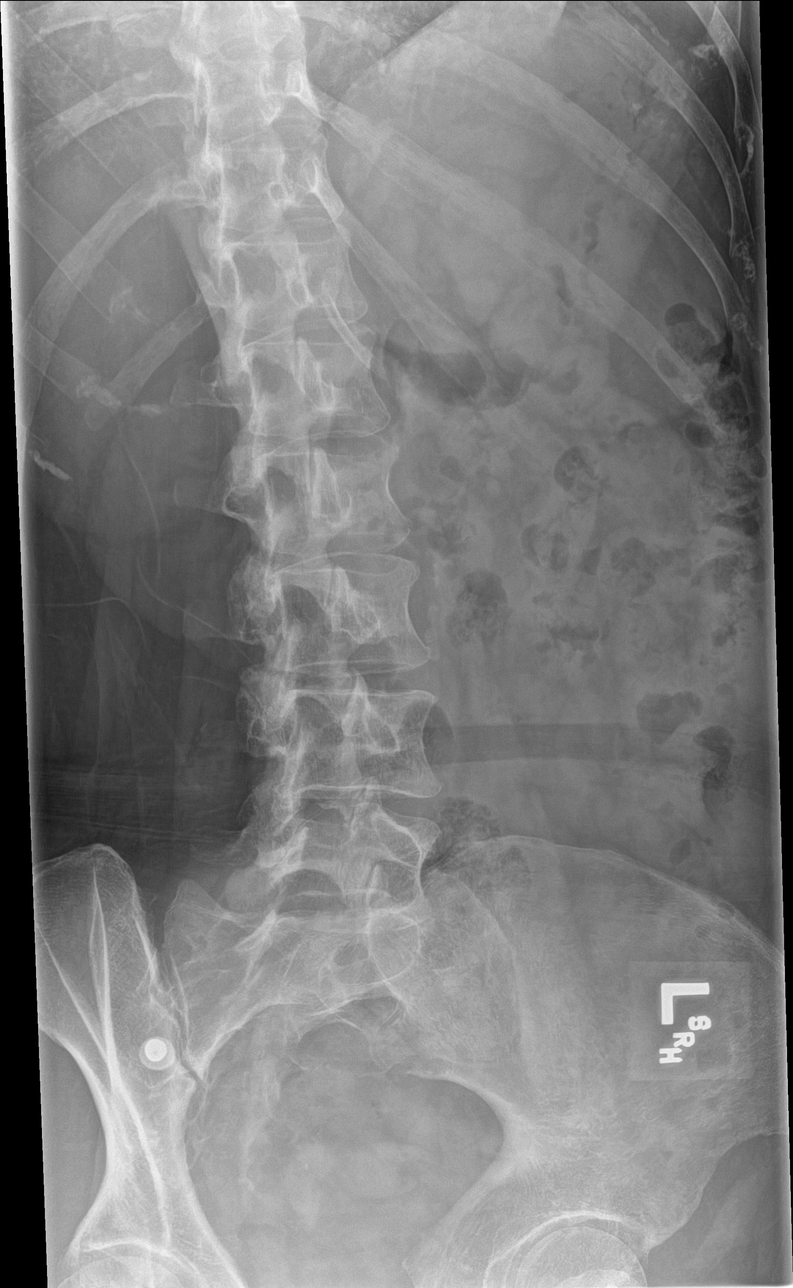

[l-spine lat]
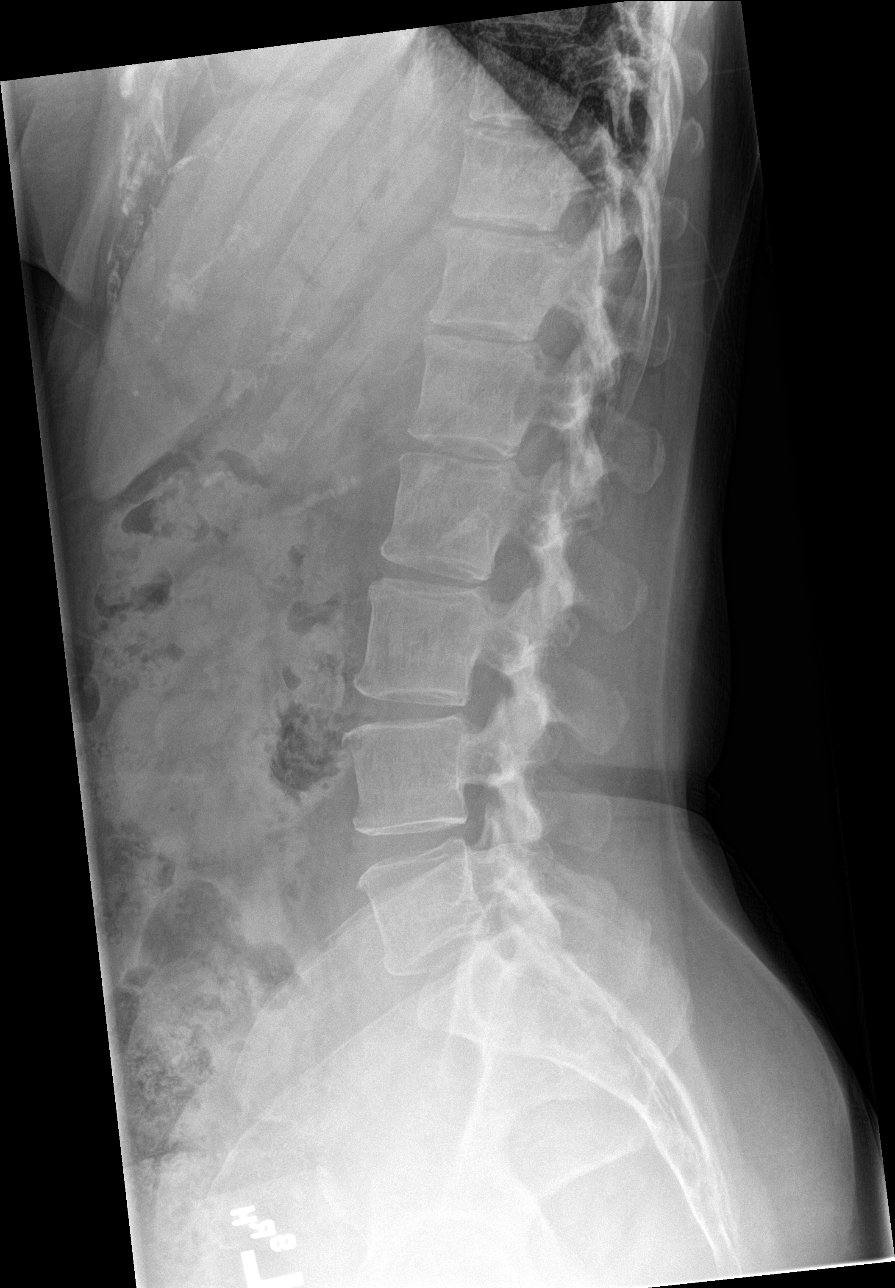

[l-spine spot]
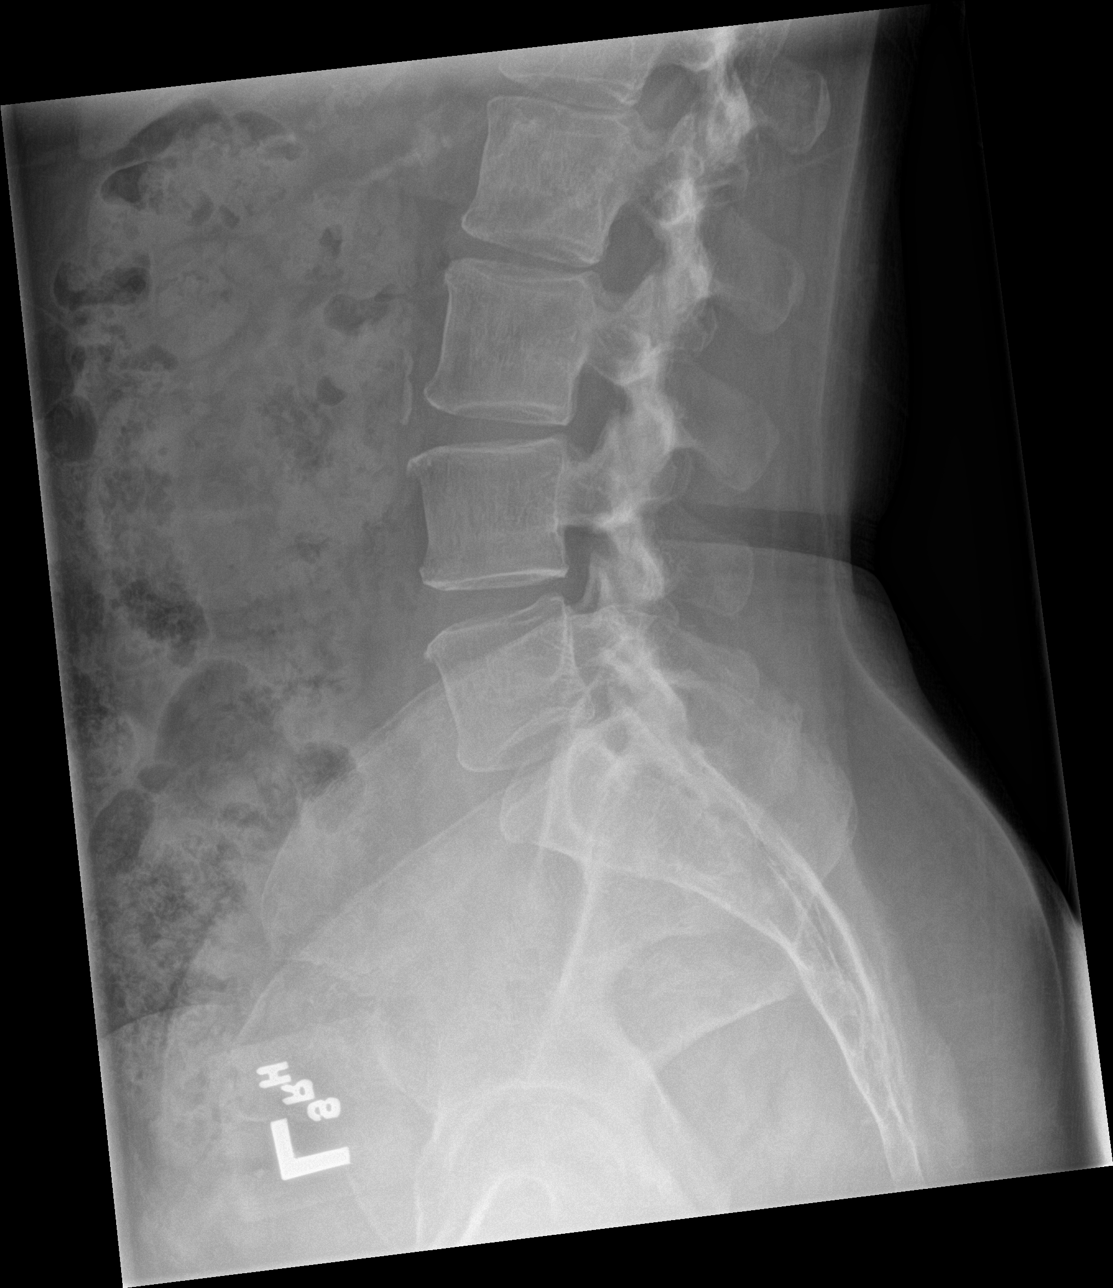

[5 of 5 positions shown; findings below may reference images not displayed]

FINDINGS: The lumbar vertebral bodies are preserved in height. The disc space
heights are well maintained. There is no spondylolisthesis. The
pedicles and transverse processes are intact. There is mild facet
joint hypertrophy at L5-S1. The observed portions of the sacrum are
normal.
IMPRESSION: There is no acute or significant chronic bony abnormality of the
lumbar spine. No high-grade disc space narrowing is observed either.
Given the persistent radicular symptoms, lumbar spine MRI would be
useful if the patient can undergo the procedure.

## 2016-07-24 ENCOUNTER — Other Ambulatory Visit: Payer: Self-pay | Admitting: Family

## 2016-07-24 MED FILL — CYANOCOBALAMIN 1,000 MCG/ML: 1000 | 60 days supply | Qty: 1 | Fill #2

## 2016-07-24 MED FILL — IMBRUVICA 140 MG CAPSULE: 140 | 30 days supply | Qty: 90 | Fill #0

## 2016-08-15 ENCOUNTER — Ambulatory Visit: Payer: Federal, State, Local not specified - PPO

## 2016-08-15 ENCOUNTER — Other Ambulatory Visit (HOSPITAL_BASED_OUTPATIENT_CLINIC_OR_DEPARTMENT_OTHER): Payer: Federal, State, Local not specified - PPO

## 2016-08-15 ENCOUNTER — Ambulatory Visit (HOSPITAL_BASED_OUTPATIENT_CLINIC_OR_DEPARTMENT_OTHER): Payer: Federal, State, Local not specified - PPO | Admitting: Hematology & Oncology

## 2016-08-15 VITALS — BP 147/77 | HR 82 | Temp 97.7°F | Resp 17 | Wt 114.0 lb

## 2016-08-15 DIAGNOSIS — D801 Nonfamilial hypogammaglobulinemia: Secondary | ICD-10-CM

## 2016-08-15 DIAGNOSIS — C880 Waldenstrom macroglobulinemia: Secondary | ICD-10-CM

## 2016-08-15 LAB — CBC WITH DIFFERENTIAL (CANCER CENTER ONLY)
BASO#: 0 10*3/uL (ref 0.0–0.2)
BASO%: 0.4 % (ref 0.0–2.0)
EOS%: 2 % (ref 0.0–7.0)
Eosinophils Absolute: 0.1 10*3/uL (ref 0.0–0.5)
HEMATOCRIT: 38.3 % (ref 34.8–46.6)
HEMOGLOBIN: 12.9 g/dL (ref 11.6–15.9)
LYMPH#: 1.8 10*3/uL (ref 0.9–3.3)
LYMPH%: 33.2 % (ref 14.0–48.0)
MCH: 35.5 pg — ABNORMAL HIGH (ref 26.0–34.0)
MCHC: 33.7 g/dL (ref 32.0–36.0)
MCV: 106 fL — ABNORMAL HIGH (ref 81–101)
MONO#: 0.4 10*3/uL (ref 0.1–0.9)
MONO%: 6.5 % (ref 0.0–13.0)
NEUT%: 57.9 % (ref 39.6–80.0)
NEUTROS ABS: 3.2 10*3/uL (ref 1.5–6.5)
Platelets: 104 10*3/uL — ABNORMAL LOW (ref 145–400)
RBC: 3.63 10*6/uL — AB (ref 3.70–5.32)
RDW: 13.1 % (ref 11.1–15.7)
WBC: 5.5 10*3/uL (ref 3.9–10.0)

## 2016-08-15 LAB — CMP (CANCER CENTER ONLY)
ALK PHOS: 53 U/L (ref 26–84)
ALT: 27 U/L (ref 10–47)
AST: 26 U/L (ref 11–38)
Albumin: 4 g/dL (ref 3.3–5.5)
BILIRUBIN TOTAL: 1.1 mg/dL (ref 0.20–1.60)
BUN, Bld: 7 mg/dL (ref 7–22)
CALCIUM: 8.8 mg/dL (ref 8.0–10.3)
CO2: 27 mEq/L (ref 18–33)
Chloride: 104 mEq/L (ref 98–108)
Creat: 0.7 mg/dl (ref 0.6–1.2)
GLUCOSE: 86 mg/dL (ref 73–118)
Potassium: 3.8 mEq/L (ref 3.3–4.7)
Sodium: 137 mEq/L (ref 128–145)
TOTAL PROTEIN: 6.4 g/dL (ref 6.4–8.1)

## 2016-08-15 LAB — TECHNOLOGIST REVIEW CHCC SATELLITE

## 2016-08-15 NOTE — Progress Notes (Signed)
Hematology and Oncology Follow Up Visit  Kristy [name] [id number] 12-13-1955 61 y.o. 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 The IVIG really has helped Kristy sinuses. She feels so much better. She does not have much in way of sinus drainage. Now that the pollen started become more of a problem, she has noted a bit of congestion but not nearly as bad.  She will like to hold off on IVIG for right now. Back in April, Kristy IgG level was 700 mg/dL.  Kristy IgM level in April was 890 mg/dL. This is holding nice and steady. Kristy M spike was 0.7 g/dL.  The big news is that Kristy first grandchild will be born in one week. It will be a girl. She is very excited about this.  She told me that she had a get a vaccination. I am not sure why. I told Kristy that she probably needs to talk with Kristy family doctor regarding this.  She will be taking a vacation this summer. In August, she will be going to the beach with Kristy family. In September, she will be going to [place], [place] to see Kristy mom.  Kristy appetite is good. She's had no diarrhea.   Overall, Kristy performance status is ECOG 1  Medications:  Allergies as of 08/15/2016   No Known Allergies     Medication List       Accurate as of 08/15/16  9:54 AM. Always use your most recent med list.          cholecalciferol 1000 units tablet Commonly known as:  VITAMIN D Take 2,000 Units by mouth daily.   cyanocobalamin 1000 MCG/ML injection Commonly known as:  (VITAMIN B-12) Inject 1,000 mcg into the skin every 8 (eight) weeks.   IMBRUVICA 140 MG capsul Generic drug:  ibrutinib TAKE 3 CAPSULES (420 MG TOTAL) BY MOUTH DAILY.       Allergies: No Known Allergies  Past Medical History, Surgical history, Social  history, and Family History were reviewed and updated.  Review of Systems: All other 10 point review of systems is negative.   Physical Exam:  weight is 114 lb (51.7 kg). Kristy oral temperature is 97.7 F (36.5 C). Kristy blood pressure is 147/77 (abnormal) and Kristy pulse is 82. Kristy respiration is 17 and oxygen saturation is 98%.   Wt Readings from Last 3 Encounters:  08/15/16 114 lb (51.7 kg)  06/23/16 113 lb (51.3 kg)  05/19/16 113 lb (51.3 kg)    Well-developed and well-nourished white female who is petite. Head and neck exam shows no ocular or oral lesions. She has no palpable cervical or supraclavicular lymph nodes. She has some tenderness to palpation over Kristy maxillary sinuses bilaterally. Lungs are clear. Cardiac exam regular rate and rhythm with no murmurs, rubs or bruits. Abdomen is soft. She has good bowel sounds. There is no fluid wave. There is no palpable liver or spleen tip. Back exam shows no tenderness over the spine, ribs or hips. Extremities shows no clubbing, cyanosis or edema. Skin exam shows no rashes, ecchymoses or petechia. Neurological exam shows no focal neurological deficits. Lab Results  Component Value Date   WBC 5.5 08/15/2016   HGB 12.9 08/15/2016   HCT 38.3 08/15/2016   MCV 106 (H) 08/15/2016  PLT 104 (L) 08/15/2016   No results found for: FERRITIN, IRON, TIBC, UIBC, IRONPCTSAT Lab Results  Component Value Date   RETICCTPCT 2.9 (H) 10/30/2014   RBC 3.63 (L) 08/15/2016   RETICCTABS 98.3 10/30/2014   Lab Results  Component Value Date   KPAFRELGTCHN 14.00 (H) 02/15/2015   LAMBDASER 0.19 (L) 02/15/2015   KAPLAMBRATIO 28.03 (H) 06/23/2016   Lab Results  Component Value Date   IGGSERUM 696 (L) 06/23/2016   IGA 17 (L) 02/15/2015   IGMSERUM 891 (H) 06/23/2016   Lab Results  Component Value Date   TOTALPROTELP 6.5 02/15/2015   ALBUMINELP 4.2 02/15/2015   A1GS 0.3 02/15/2015   A2GS 0.5 02/15/2015   BETS 0.4 02/15/2015   BETA2SER 0.2 02/15/2015    GAMS 1.0 02/15/2015   MSPIKE 0.7 (H) 06/23/2016   SPEI * 02/15/2015     Chemistry      Component Value Date/Time   NA 137 08/15/2016 0843   NA 139 03/16/2016 1337   K 3.8 08/15/2016 0843   K 3.8 03/16/2016 1337   CL 104 08/15/2016 0843   CO2 27 08/15/2016 0843   CO2 27 03/16/2016 1337   BUN 7 08/15/2016 0843   BUN 5.2 (L) 03/16/2016 1337   CREATININE 0.7 08/15/2016 0843   CREATININE 0.7 03/16/2016 1337      Component Value Date/Time   CALCIUM 8.8 08/15/2016 0843   CALCIUM 8.8 03/16/2016 1337   ALKPHOS 53 08/15/2016 0843   ALKPHOS 62 03/16/2016 1337   AST 26 08/15/2016 0843   AST 24 03/16/2016 1337   ALT 27 08/15/2016 0843   ALT 19 03/16/2016 1337   BILITOT 1.10 08/15/2016 0843   BILITOT 0.57 03/16/2016 1337     Impression and Plan: Kristy [name] is a very pleasant 89 year yo white female with history of Waldenstrm's macroglobulinemia. She has done well despite relapses.   So far, everything is holding nice and stable. Kristy IgM level is not going up. We do not have to make any changes to the Imbruvica.  She wants to hold off on IVIG. I think we can do this. She's been asymptomatic with respect to Kristy sinusitis.   She is really afford to Kristy [name] being born. This will be in one week.  I will like to see Kristy back in late July. They will be going to the beach in August. I want to make sure that everything is okay with respect to Kristy immunoglobulins and we will see if she will need IVIG at that time.   Kristy Napoleon, MD 5/29/20189:54 AM  9

## 2016-08-16 ENCOUNTER — Telehealth: Payer: Self-pay | Admitting: *Deleted

## 2016-08-16 LAB — IGG, IGA, IGM
IGA/IMMUNOGLOBULIN A, SERUM: 16 mg/dL — AB (ref 87–352)
IGG (IMMUNOGLOBIN G), SERUM: 565 mg/dL — AB (ref 700–1600)
IgM, Qn, Serum: 822 mg/dL — ABNORMAL HIGH (ref 26–217)

## 2016-08-16 LAB — KAPPA/LAMBDA LIGHT CHAINS
Ig Kappa Free Light Chain: 72 mg/L — ABNORMAL HIGH (ref 3.3–19.4)
Ig Lambda Free Light Chain: 3.1 mg/L — ABNORMAL LOW (ref 5.7–26.3)
Kappa/Lambda FluidC Ratio: 23.23 — ABNORMAL HIGH (ref 0.26–1.65)

## 2016-08-16 NOTE — Telephone Encounter (Addendum)
Patient is aware of results.   ----- Message from [name], MD sent at 08/16/2016  7:14 AM EDT ----- Call IgM is down to 822!!!  Great job!!  Kristy [name]

## 2016-08-17 LAB — PROTEIN ELECTROPHORESIS, SERUM, WITH REFLEX
A/G Ratio: 1.4 (ref 0.7–1.7)
Albumin: 3.7 g/dL (ref 2.9–4.4)
Alpha 1: 0.2 g/dL (ref 0.0–0.4)
Alpha 2: 0.5 g/dL (ref 0.4–1.0)
BETA: 0.8 g/dL (ref 0.7–1.3)
GAMMA GLOBULIN: 1.1 g/dL (ref 0.4–1.8)
GLOBULIN, TOTAL: 2.7 g/dL (ref 2.2–3.9)
Interpretation(See Below): 0
M-Spike, %: 0.7 g/dL — ABNORMAL HIGH
TOTAL PROTEIN: 6.4 g/dL (ref 6.0–8.5)

## 2016-08-21 MED FILL — IMBRUVICA 140 MG CAPSULE: 140 | 30 days supply | Qty: 90 | Fill #1

## 2016-09-19 MED FILL — CYANOCOBALAMIN 1,000 MCG/ML: 1000 | 60 days supply | Qty: 1 | Fill #3 | Status: TO

## 2016-09-19 MED FILL — IMBRUVICA 140 MG CAPSULE: 140 | 30 days supply | Qty: 90 | Fill #2 | Status: TO

## 2016-10-12 ENCOUNTER — Encounter: Payer: Self-pay | Admitting: Podiatry

## 2016-10-12 ENCOUNTER — Ambulatory Visit (INDEPENDENT_AMBULATORY_CARE_PROVIDER_SITE_OTHER): Payer: Federal, State, Local not specified - PPO | Admitting: Podiatry

## 2016-10-12 ENCOUNTER — Other Ambulatory Visit (HOSPITAL_BASED_OUTPATIENT_CLINIC_OR_DEPARTMENT_OTHER): Payer: Federal, State, Local not specified - PPO

## 2016-10-12 ENCOUNTER — Ambulatory Visit (HOSPITAL_BASED_OUTPATIENT_CLINIC_OR_DEPARTMENT_OTHER): Payer: Federal, State, Local not specified - PPO | Admitting: Hematology & Oncology

## 2016-10-12 VITALS — BP 140/84 | HR 84 | Temp 97.0°F | Resp 16 | Wt 115.0 lb

## 2016-10-12 VITALS — BP 112/78 | HR 65

## 2016-10-12 DIAGNOSIS — D801 Nonfamilial hypogammaglobulinemia: Secondary | ICD-10-CM | POA: Diagnosis not present

## 2016-10-12 DIAGNOSIS — C880 Waldenstrom macroglobulinemia: Secondary | ICD-10-CM

## 2016-10-12 DIAGNOSIS — Q828 Other specified congenital malformations of skin: Secondary | ICD-10-CM | POA: Diagnosis not present

## 2016-10-12 LAB — CBC WITH DIFFERENTIAL (CANCER CENTER ONLY)
BASO#: 0 10*3/uL (ref 0.0–0.2)
BASO%: 0.5 % (ref 0.0–2.0)
EOS ABS: 0.1 10*3/uL (ref 0.0–0.5)
EOS%: 0.9 % (ref 0.0–7.0)
HCT: 37.3 % (ref 34.8–46.6)
HGB: 12.8 g/dL (ref 11.6–15.9)
LYMPH#: 1.5 10*3/uL (ref 0.9–3.3)
LYMPH%: 27.3 % (ref 14.0–48.0)
MCH: 35.8 pg — ABNORMAL HIGH (ref 26.0–34.0)
MCHC: 34.3 g/dL (ref 32.0–36.0)
MCV: 104 fL — ABNORMAL HIGH (ref 81–101)
MONO#: 0.3 10*3/uL (ref 0.1–0.9)
MONO%: 5.9 % (ref 0.0–13.0)
NEUT%: 65.4 % (ref 39.6–80.0)
NEUTROS ABS: 3.6 10*3/uL (ref 1.5–6.5)
PLATELETS: 101 10*3/uL — AB (ref 145–400)
RBC: 3.58 10*6/uL — ABNORMAL LOW (ref 3.70–5.32)
RDW: 12.1 % (ref 11.1–15.7)
WBC: 5.5 10*3/uL (ref 3.9–10.0)

## 2016-10-12 LAB — LACTATE DEHYDROGENASE: LDH: 225 U/L (ref 125–245)

## 2016-10-12 LAB — CMP (CANCER CENTER ONLY)
ALBUMIN: 4.3 g/dL (ref 3.3–5.5)
ALT(SGPT): 26 U/L (ref 10–47)
AST: 27 U/L (ref 11–38)
Alkaline Phosphatase: 57 U/L (ref 26–84)
BUN, Bld: 9 mg/dL (ref 7–22)
CALCIUM: 9 mg/dL (ref 8.0–10.3)
CHLORIDE: 106 meq/L (ref 98–108)
CO2: 27 meq/L (ref 18–33)
Creat: 0.7 mg/dl (ref 0.6–1.2)
Glucose, Bld: 87 mg/dL (ref 73–118)
Potassium: 4 mEq/L (ref 3.3–4.7)
Sodium: 138 mEq/L (ref 128–145)
Total Bilirubin: 1.3 mg/dl (ref 0.20–1.60)
Total Protein: 6.3 g/dL — ABNORMAL LOW (ref 6.4–8.1)

## 2016-10-12 LAB — TECHNOLOGIST REVIEW CHCC SATELLITE

## 2016-10-12 NOTE — Progress Notes (Signed)
   Subjective:    Patient ID: Kristy [name], female    DOB: 1955/05/03, 61 y.o.   MRN: [id number]  HPI: She presents today chief complaint of bilateral plantar forefoot lesions that cause pain. Simulated bothering her for many years and seems to be getting worse.    Review of Systems  Musculoskeletal: Positive for arthralgias and back pain.  All other systems reviewed and are negative.      Objective:   Physical Exam: Vital signs are stable she is alert and oriented 3. Pulses are palpable. Neurologic sensorium is intact. Deep tendon reflexes are intact. Muscle strength +5 over 5 dorsiflexion plantar flexors and inverters everters all intrinsic musculature is intact. Orthopedic evaluation demonstrates all joints distal to the angle full range of motion without crepitation. Continues evaluation demonstrates porokeratotic lesions beneath the second metatarsophalangeal joint with mild diffuse thickening of the skin surrounding them. No open lesions or wounds.        Assessment & Plan:  Porokeratosis bilateral foot.  Plan: Debrided all reactive hyperkeratoses for her today will follow up with her on an as-needed basis.

## 2016-10-12 NOTE — Progress Notes (Signed)
Hematology and Oncology Follow Up Visit  Kristy [name] [id number] [birth year] 61 y.o. 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 is here today for a follow-up. She is very excited. She has a new granddaughter. She has warmed in early June. Everything turned out well. Her grandbaby is very healthy.  She has had no problems with in situ infections. We have given her IVIG in the past. Her last IgG level was 565 back in May.  Her last IgM level was 822 mg/dL. This is holding quite steady.  Her sinuses have been doing pretty well. She really has not had much in way of sinus infections.  She is working. She's having no problems with work.  She has a go to [place] sometime this summer. Her mother is up there. She is 49 years old.  Her appetite is good. She's had no diarrhea.   Overall, her performance status is ECOG 1  Medications:  Allergies as of 10/12/2016   No Known Allergies     Medication List       Accurate as of 10/12/16  8:40 AM. Always use your most recent med list.          cholecalciferol 1000 units tablet Commonly known as:  VITAMIN D Take 2,000 Units by mouth daily.   cyanocobalamin 1000 MCG/ML injection Commonly known as:  (VITAMIN B-12) Inject 1,000 mcg into the skin every 8 (eight) weeks.   IMBRUVICA 140 MG capsul Generic drug:  ibrutinib TAKE 3 CAPSULES (420 MG TOTAL) BY MOUTH DAILY.       Allergies: No Known Allergies  Past Medical History, Surgical history, Social history, and Family History were reviewed and updated.  Review of Systems: All other 10 point review of systems is negative.   Physical Exam:  weight is 115 lb (52.2 kg). Her oral temperature is 97 F (36.1 C) (abnormal). Her blood pressure is 140/84 and her pulse is 84. Her  respiration is 16 and oxygen saturation is 99%.   Wt Readings from Last 3 Encounters:  10/12/16 115 lb (52.2 kg)  08/15/16 114 lb (51.7 kg)  06/23/16 113 lb (51.3 kg)    Well-developed and well-nourished white female who is petite. Head and neck exam shows no ocular or oral lesions. She has no palpable cervical or supraclavicular lymph nodes. She has some tenderness to palpation over her maxillary sinuses bilaterally. Lungs are clear. Cardiac exam regular rate and rhythm with no murmurs, rubs or bruits. Abdomen is soft. She has good bowel sounds. There is no fluid wave. There is no palpable liver or spleen tip. Back exam shows no tenderness over the spine, ribs or hips. Extremities shows no clubbing, cyanosis or edema. Skin exam shows no rashes, ecchymoses or petechia. Neurological exam shows no focal neurological deficits. Lab Results  Component Value Date   WBC 5.5 08/15/2016   HGB 12.9 08/15/2016   HCT 38.3 08/15/2016   MCV 106 (H) 08/15/2016   PLT 104 (L) 08/15/2016   No results found for: FERRITIN, IRON, TIBC, UIBC, IRONPCTSAT Lab Results  Component Value Date   RETICCTPCT 2.9 (H) 10/30/2014   RBC 3.63 (L) 08/15/2016   RETICCTABS 98.3 10/30/2014   Lab Results  Component Value Date   KPAFRELGTCHN 14.00 (H) 02/15/2015   LAMBDASER 0.19 (L) 02/15/2015   KAPLAMBRATIO 23.23 (H) 08/15/2016  Lab Results  Component Value Date   IONGEXBM 841 (L) 08/15/2016   IGA 17 (L) 02/15/2015   IGMSERUM 822 (H) 08/15/2016   Lab Results  Component Value Date   TOTALPROTELP 6.5 02/15/2015   ALBUMINELP 4.2 02/15/2015   A1GS 0.3 02/15/2015   A2GS 0.5 02/15/2015   BETS 0.4 02/15/2015   BETA2SER 0.2 02/15/2015   GAMS 1.0 02/15/2015   MSPIKE 0.7 (H) 08/15/2016   SPEI * 02/15/2015     Chemistry      Component Value Date/Time   NA 137 08/15/2016 0843   NA 139 03/16/2016 1337   K 3.8 08/15/2016 0843   K 3.8 03/16/2016 1337   CL 104 08/15/2016 0843   CO2 27 08/15/2016 0843   CO2 27  03/16/2016 1337   BUN 7 08/15/2016 0843   BUN 5.2 (L) 03/16/2016 1337   CREATININE 0.7 08/15/2016 0843   CREATININE 0.7 03/16/2016 1337      Component Value Date/Time   CALCIUM 8.8 08/15/2016 0843   CALCIUM 8.8 03/16/2016 1337   ALKPHOS 53 08/15/2016 0843   ALKPHOS 62 03/16/2016 1337   AST 26 08/15/2016 0843   AST 24 03/16/2016 1337   ALT 27 08/15/2016 0843   ALT 19 03/16/2016 1337   BILITOT 1.10 08/15/2016 0843   BILITOT 0.57 03/16/2016 1337     Impression and Plan: [name] is a very pleasant 77 year yo white female with history of Waldenstrm's macroglobulinemia. She has done well despite relapses.   So far, everything is holding nice and stable. Her IgM level is not going up. We do not have to make any changes to the Imbruvica.  I'm so happy that she has a new granddaughter. This really is a big event for her.  We will get her back in 3 months. If there are any certificate changes in her IgM or IgM level in general, that we get her back sooner.    [name], MD 7/26/20188:40 AM

## 2016-10-13 ENCOUNTER — Encounter: Payer: Self-pay | Admitting: *Deleted

## 2016-10-13 ENCOUNTER — Telehealth: Payer: Self-pay | Admitting: *Deleted

## 2016-10-13 LAB — IGG, IGA, IGM
IGA/IMMUNOGLOBULIN A, SERUM: 16 mg/dL — AB (ref 87–352)
IGG (IMMUNOGLOBIN G), SERUM: 390 mg/dL — AB (ref 700–1600)
IGM (IMMUNOGLOBIN M), SRM: 823 mg/dL — AB (ref 26–217)

## 2016-10-13 LAB — KAPPA/LAMBDA LIGHT CHAINS
IG LAMBDA FREE LIGHT CHAIN: 2.3 mg/L — AB (ref 5.7–26.3)
Ig Kappa Free Light Chain: 65.8 mg/L — ABNORMAL HIGH (ref 3.3–19.4)
KAPPA/LAMBDA FLC RATIO: 28.61 — AB (ref 0.26–1.65)

## 2016-10-13 MED FILL — IMBRUVICA 140 MG CAPSULE: 140 | 30 days supply | Qty: 90 | Fill #0

## 2016-10-13 NOTE — Telephone Encounter (Addendum)
Message left. Will send MyChart message  ----- Message from [name], MD sent at 10/13/2016  6:49 AM EDT ----- Call - IgM level is very stable!!  The IgM level is 823!!!  Before is was 822!!  Great job!!!  Kristy [name]

## 2016-10-16 LAB — PROTEIN ELECTROPHORESIS, SERUM, WITH REFLEX
A/G Ratio: 1.5 (ref 0.7–1.7)
Albumin: 4 g/dL (ref 2.9–4.4)
Alpha 1: 0.2 g/dL (ref 0.0–0.4)
Alpha 2: 0.5 g/dL (ref 0.4–1.0)
Beta: 0.9 g/dL (ref 0.7–1.3)
GLOBULIN, TOTAL: 2.7 g/dL (ref 2.2–3.9)
Gamma Globulin: 1 g/dL (ref 0.4–1.8)
INTERPRETATION(SEE BELOW): 0
M-SPIKE, %: 0.6 g/dL — AB
Total Protein: 6.7 g/dL (ref 6.0–8.5)

## 2016-10-26 ENCOUNTER — Telehealth: Payer: Self-pay | Admitting: Pharmacist

## 2016-10-26 NOTE — Telephone Encounter (Signed)
Oral Chemotherapy Pharmacist Encounter  Follow-Up Form  Called patient today to follow up regarding patient's oral chemotherapy medication: Ibrutinib  Original Start date of oral chemotherapy: 01/2014 (per [facility] Everywhere)  Pt is doing well today  Pt reports 0 tablets/doses of ibrutinib missed in the last month.  Missed dose(s) attributed to: N/A  Pt reports the following side effects: patient report no side effects  Recent labs reviewed:  Other things discussed: patient plans to pick up her prescription at [facility] where it was couriered from [facility]  Patient knows to call the office with questions or concerns. Oral Oncology Clinic will continue to follow.  Thank you,  Nuala Alpha, PharmD, BCPS 10/26/2016 11:12 AM Oral Oncology Clinic

## 2016-11-07 ENCOUNTER — Other Ambulatory Visit: Payer: Self-pay | Admitting: Family

## 2016-11-08 MED FILL — IMBRUVICA 140 MG CAPSULE: 140 | 30 days supply | Qty: 90 | Fill #0

## 2016-11-14 ENCOUNTER — Ambulatory Visit (HOSPITAL_BASED_OUTPATIENT_CLINIC_OR_DEPARTMENT_OTHER): Payer: Federal, State, Local not specified - PPO | Admitting: Hematology & Oncology

## 2016-11-14 ENCOUNTER — Ambulatory Visit (HOSPITAL_BASED_OUTPATIENT_CLINIC_OR_DEPARTMENT_OTHER): Payer: Federal, State, Local not specified - PPO

## 2016-11-14 ENCOUNTER — Other Ambulatory Visit (HOSPITAL_BASED_OUTPATIENT_CLINIC_OR_DEPARTMENT_OTHER): Payer: Federal, State, Local not specified - PPO

## 2016-11-14 VITALS — BP 103/49 | HR 82 | Temp 98.2°F | Resp 17

## 2016-11-14 DIAGNOSIS — J329 Chronic sinusitis, unspecified: Secondary | ICD-10-CM | POA: Diagnosis not present

## 2016-11-14 DIAGNOSIS — C801 Malignant (primary) neoplasm, unspecified: Secondary | ICD-10-CM | POA: Diagnosis not present

## 2016-11-14 DIAGNOSIS — M810 Age-related osteoporosis without current pathological fracture: Secondary | ICD-10-CM

## 2016-11-14 DIAGNOSIS — C880 Waldenstrom macroglobulinemia: Secondary | ICD-10-CM

## 2016-11-14 DIAGNOSIS — D801 Nonfamilial hypogammaglobulinemia: Secondary | ICD-10-CM

## 2016-11-14 DIAGNOSIS — J0101 Acute recurrent maxillary sinusitis: Secondary | ICD-10-CM

## 2016-11-14 LAB — CBC WITH DIFFERENTIAL (CANCER CENTER ONLY)
BASO#: 0 10*3/uL (ref 0.0–0.2)
BASO%: 0.3 % (ref 0.0–2.0)
EOS%: 1.1 % (ref 0.0–7.0)
Eosinophils Absolute: 0.1 10*3/uL (ref 0.0–0.5)
HCT: 38.3 % (ref 34.8–46.6)
HGB: 13.1 g/dL (ref 11.6–15.9)
LYMPH#: 1.5 10*3/uL (ref 0.9–3.3)
LYMPH%: 24.5 % (ref 14.0–48.0)
MCH: 35.8 pg — AB (ref 26.0–34.0)
MCHC: 34.2 g/dL (ref 32.0–36.0)
MCV: 105 fL — AB (ref 81–101)
MONO#: 0.4 10*3/uL (ref 0.1–0.9)
MONO%: 6.2 % (ref 0.0–13.0)
NEUT#: 4.2 10*3/uL (ref 1.5–6.5)
NEUT%: 67.9 % (ref 39.6–80.0)
PLATELETS: 114 10*3/uL — AB (ref 145–400)
RBC: 3.66 10*6/uL — ABNORMAL LOW (ref 3.70–5.32)
RDW: 12.4 % (ref 11.1–15.7)
WBC: 6.1 10*3/uL (ref 3.9–10.0)

## 2016-11-14 LAB — CMP (CANCER CENTER ONLY)
ALT: 17 U/L (ref 10–47)
AST: 29 U/L (ref 11–38)
Albumin: 4.2 g/dL (ref 3.3–5.5)
Alkaline Phosphatase: 71 U/L (ref 26–84)
BUN: 9 mg/dL (ref 7–22)
CHLORIDE: 104 meq/L (ref 98–108)
CO2: 32 meq/L (ref 18–33)
Calcium: 8.9 mg/dL (ref 8.0–10.3)
Creat: 0.9 mg/dl (ref 0.6–1.2)
GLUCOSE: 89 mg/dL (ref 73–118)
POTASSIUM: 4 meq/L (ref 3.3–4.7)
Sodium: 142 mEq/L (ref 128–145)
Total Bilirubin: 1.4 mg/dl (ref 0.20–1.60)
Total Protein: 6.6 g/dL (ref 6.4–8.1)

## 2016-11-14 LAB — CHCC SATELLITE - SMEAR

## 2016-11-14 LAB — TECHNOLOGIST REVIEW CHCC SATELLITE

## 2016-11-14 MED ORDER — AMOXICILLIN-POT CLAVULANATE 875-125 MG PO TABS: 1.0000 | ORAL_TABLET | Freq: Two times a day (BID) | ORAL | 0 refills | Status: DC

## 2016-11-14 MED ORDER — DIPHENHYDRAMINE HCL 25 MG PO CAPS
25.0000 mg | ORAL_CAPSULE | Freq: Once | ORAL | Status: AC
  Administered 2016-11-14: 25 mg via ORAL

## 2016-11-14 MED ORDER — ACETAMINOPHEN 325 MG PO TABS
650.0000 mg | ORAL_TABLET | Freq: Once | ORAL | Status: AC
  Administered 2016-11-14: 650 mg via ORAL

## 2016-11-14 MED ORDER — ACETAMINOPHEN 325 MG PO TABS
ORAL_TABLET | ORAL | Status: AC
  Filled 2016-11-14: qty 2

## 2016-11-14 MED ORDER — DIPHENHYDRAMINE HCL 25 MG PO CAPS
ORAL_CAPSULE | ORAL | Status: AC
  Filled 2016-11-14: qty 1

## 2016-11-14 MED ORDER — IMMUNE GLOBULIN (HUMAN) 20 GM/200ML IV SOLN
40.0000 g | Freq: Once | INTRAVENOUS | Status: AC
  Administered 2016-11-14: 40 g via INTRAVENOUS
  Filled 2016-11-14: qty 400

## 2016-11-14 MED ORDER — DEXTROSE 5 % IV SOLN
INTRAVENOUS | Status: DC
  Administered 2016-11-14: 10:00:00 via INTRAVENOUS

## 2016-11-14 MED FILL — AMOX-CLAV 875-125 MG TABLET: 875-125 | 7 days supply | Qty: 14 | Fill #0

## 2016-11-14 NOTE — Patient Instructions (Signed)

## 2016-11-14 NOTE — Progress Notes (Signed)
Hematology and Oncology Follow Up Visit  Kristy [name] [id number] 1955/07/27 61 y.o. 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 Everything is going quite well. She isn't ready to go to [name] next week. Kristy [name] 56 year old mother lives there.  Kristy [name] granddaughter is doing quite well. She is so happy that she can play with Kristy [name] granddaughter. Kristy [name] granddaughter I think is only several months old.  She is doing well with the IVIG. This really is helping with Kristy [name] sinusitis. Kristy [name] last IgG level was only 390 mg/dL. As such, she is at significant risk for upper respiratory infections.  She's not having any problems with the Imbruvica. Kristy [name] last IgM level was 833 milligrams per deciliter. This is holding stable.  She is still working. She's having no problems with fatigue. There's no rashes. She's having no change in bowel or bladder habits.  There is no bleeding.  Overall, Kristy [name] performance status is ECOG 1.  Medications:  Allergies as of 11/14/2016   No Known Allergies     Medication List       Accurate as of 11/14/16  9:26 AM. Always use your most recent med list.          cholecalciferol 1000 units tablet Commonly known as:  VITAMIN D Take 2,000 Units by mouth daily.   cyanocobalamin 1000 MCG/ML injection Commonly known as:  (VITAMIN B-12) Inject 1,000 mcg into the skin every 8 (eight) weeks.   IMBRUVICA 140 MG capsul Generic drug:  ibrutinib TAKE 3 CAPSULES (420 MG TOTAL) BY MOUTH DAILY.       Allergies: No Known Allergies  Past Medical History, Surgical history, Social history, and Family History were reviewed and updated.  Review of Systems: As stated in the interim history  Physical Exam:  weight is 115 lb (52.2 kg). Kristy [name] oral temperature  is 98 F (36.7 C). Kristy [name] blood pressure is 125/65 and Kristy [name] pulse is 87. Kristy [name] respiration is 16 and oxygen saturation is 97%.   Wt Readings from Last 3 Encounters:  11/14/16 115 lb (52.2 kg)  10/12/16 115 lb (52.2 kg)  08/15/16 114 lb (51.7 kg)    Physical Exam  Constitutional: She is oriented to person, place, and time.  HENT:  Head: Normocephalic and atraumatic.  Mouth/Throat: Oropharynx is clear and moist.  Eyes: Pupils are equal, round, and reactive to light. EOM are normal.  Neck: Normal range of motion.  Cardiovascular: Normal rate, regular rhythm and normal heart sounds.   Pulmonary/Chest: Effort normal and breath sounds normal.  Abdominal: Soft. Bowel sounds are normal.  Musculoskeletal: Normal range of motion. She exhibits no edema, tenderness or deformity.  Lymphadenopathy:    She has no cervical adenopathy.  Neurological: She is alert and oriented to person, place, and time.  Skin: Skin is warm and dry. No rash noted. No erythema.  Psychiatric: She has a normal mood and affect. Kristy [name] behavior is normal. Judgment and thought content normal.  Vitals reviewed.     Lab Results  Component Value Date   WBC 6.1 11/14/2016   HGB 13.1 11/14/2016   HCT 38.3 11/14/2016   MCV 105 (H) 11/14/2016   PLT 114 (L) 11/14/2016   No results found for: FERRITIN, IRON, TIBC, UIBC, IRONPCTSAT Lab Results  Component Value Date   RETICCTPCT 2.9 (H)  10/30/2014   RBC 3.66 (L) 11/14/2016   RETICCTABS 98.3 10/30/2014   Lab Results  Component Value Date   KPAFRELGTCHN 14.00 (H) 02/15/2015   LAMBDASER 0.19 (L) 02/15/2015   KAPLAMBRATIO 28.61 (H) 10/12/2016   Lab Results  Component Value Date   IGGSERUM 390 (L) 10/12/2016   IGA 17 (L) 02/15/2015   IGMSERUM 823 (H) 10/12/2016   Lab Results  Component Value Date   TOTALPROTELP 6.5 02/15/2015   ALBUMINELP 4.2 02/15/2015   A1GS 0.3 02/15/2015   A2GS 0.5 02/15/2015   BETS 0.4 02/15/2015   BETA2SER 0.2 02/15/2015   GAMS 1.0 02/15/2015     MSPIKE 0.6 (H) 10/12/2016   SPEI * 02/15/2015     Chemistry      Component Value Date/Time   NA 142 11/14/2016 0840   NA 139 03/16/2016 1337   K 4.0 11/14/2016 0840   K 3.8 03/16/2016 1337   CL 104 11/14/2016 0840   CO2 32 11/14/2016 0840   CO2 27 03/16/2016 1337   BUN 9 11/14/2016 0840   BUN 5.2 (L) 03/16/2016 1337   CREATININE 0.9 11/14/2016 0840   CREATININE 0.7 03/16/2016 1337      Component Value Date/Time   CALCIUM 8.9 11/14/2016 0840   CALCIUM 8.8 03/16/2016 1337   ALKPHOS 71 11/14/2016 0840   ALKPHOS 62 03/16/2016 1337   AST 29 11/14/2016 0840   AST 24 03/16/2016 1337   ALT 17 11/14/2016 0840   ALT 19 03/16/2016 1337   BILITOT 1.40 11/14/2016 0840   BILITOT 0.57 03/16/2016 1337     Impression and Plan: Kristy [name] is a very pleasant 79 year yo white female with history of Waldenstrm's macroglobulinemia. She has done well despite relapses.   We will go ahead and give Kristy [name] IVIG today. This, I think, will really help with Kristy [name] chronic sinusitis.  I will also give Kristy [name] a prescription for Augmentin to take when she goes on [name] vacation to [name]. She did have this in case she does run into problems.   The Kristy [name] is doing a great job. As long as Kristy [name] IgM is stable, there is no need to make any changes.   We will go ahead and get Kristy [name] back in 6 weeks.  Kristy Napoleon, MD 8/28/20189:26 AM

## 2016-11-15 ENCOUNTER — Telehealth: Payer: Self-pay | Admitting: *Deleted

## 2016-11-15 LAB — KAPPA/LAMBDA LIGHT CHAINS
Ig Kappa Free Light Chain: 52.6 mg/L — ABNORMAL HIGH (ref 3.3–19.4)
Ig Lambda Free Light Chain: 3.3 mg/L — ABNORMAL LOW (ref 5.7–26.3)
KAPPA/LAMBDA FLC RATIO: 15.94 — AB (ref 0.26–1.65)

## 2016-11-15 LAB — IGG, IGA, IGM
IGA/IMMUNOGLOBULIN A, SERUM: 15 mg/dL — AB (ref 87–352)
IGG (IMMUNOGLOBIN G), SERUM: 316 mg/dL — AB (ref 700–1600)
IgM, Qn, Serum: 761 mg/dL — ABNORMAL HIGH (ref 26–217)

## 2016-11-15 NOTE — Telephone Encounter (Addendum)
Patient aware of results  ----- Message from [name], MD sent at 11/15/2016  6:58 AM EDT ----- Call - IgM level is down to 761!!  Have a great time in [place]!!!  Kristy [name]

## 2016-11-16 LAB — PROTEIN ELECTROPHORESIS, SERUM, WITH REFLEX
A/G RATIO SPE: 1.5 (ref 0.7–1.7)
ALPHA 2: 0.5 g/dL (ref 0.4–1.0)
Albumin: 3.7 g/dL (ref 2.9–4.4)
Alpha 1: 0.2 g/dL (ref 0.0–0.4)
BETA: 0.8 g/dL (ref 0.7–1.3)
GLOBULIN, TOTAL: 2.4 g/dL (ref 2.2–3.9)
Gamma Globulin: 0.8 g/dL (ref 0.4–1.8)
INTERPRETATION(SEE BELOW): 0
M-SPIKE, %: 0.4 g/dL — AB
TOTAL PROTEIN: 6.1 g/dL (ref 6.0–8.5)

## 2016-11-28 MED FILL — IMBRUVICA 140 MG CAPSULE: 140 | 30 days supply | Qty: 90 | Fill #1

## 2016-12-21 ENCOUNTER — Other Ambulatory Visit: Payer: Federal, State, Local not specified - PPO

## 2016-12-21 ENCOUNTER — Ambulatory Visit: Payer: Federal, State, Local not specified - PPO | Admitting: Hematology & Oncology

## 2016-12-26 ENCOUNTER — Ambulatory Visit (HOSPITAL_BASED_OUTPATIENT_CLINIC_OR_DEPARTMENT_OTHER): Payer: Federal, State, Local not specified - PPO | Admitting: Family

## 2016-12-26 ENCOUNTER — Other Ambulatory Visit (HOSPITAL_BASED_OUTPATIENT_CLINIC_OR_DEPARTMENT_OTHER): Payer: Federal, State, Local not specified - PPO

## 2016-12-26 ENCOUNTER — Ambulatory Visit (HOSPITAL_BASED_OUTPATIENT_CLINIC_OR_DEPARTMENT_OTHER): Payer: Federal, State, Local not specified - PPO

## 2016-12-26 VITALS — BP 126/61 | HR 89 | Temp 97.9°F | Resp 18 | Wt 116.0 lb

## 2016-12-26 VITALS — BP 106/56 | HR 68 | Temp 98.3°F | Resp 18

## 2016-12-26 DIAGNOSIS — C880 Waldenstrom macroglobulinemia: Secondary | ICD-10-CM

## 2016-12-26 DIAGNOSIS — D801 Nonfamilial hypogammaglobulinemia: Secondary | ICD-10-CM

## 2016-12-26 DIAGNOSIS — J0101 Acute recurrent maxillary sinusitis: Secondary | ICD-10-CM

## 2016-12-26 LAB — CMP (CANCER CENTER ONLY)
ALBUMIN: 4.1 g/dL (ref 3.3–5.5)
ALT(SGPT): 20 U/L (ref 10–47)
AST: 28 U/L (ref 11–38)
Alkaline Phosphatase: 60 U/L (ref 26–84)
BILIRUBIN TOTAL: 1.3 mg/dL (ref 0.20–1.60)
BUN, Bld: 10 mg/dL (ref 7–22)
CALCIUM: 9.2 mg/dL (ref 8.0–10.3)
CHLORIDE: 106 meq/L (ref 98–108)
CO2: 29 meq/L (ref 18–33)
Creat: 0.9 mg/dl (ref 0.6–1.2)
Glucose, Bld: 92 mg/dL (ref 73–118)
POTASSIUM: 4 meq/L (ref 3.3–4.7)
Sodium: 144 mEq/L (ref 128–145)
Total Protein: 6.5 g/dL (ref 6.4–8.1)

## 2016-12-26 LAB — CBC WITH DIFFERENTIAL (CANCER CENTER ONLY)
BASO#: 0 10*3/uL (ref 0.0–0.2)
BASO%: 0.3 % (ref 0.0–2.0)
EOS ABS: 0.1 10*3/uL (ref 0.0–0.5)
EOS%: 0.9 % (ref 0.0–7.0)
HEMATOCRIT: 40.2 % (ref 34.8–46.6)
HEMOGLOBIN: 13.8 g/dL (ref 11.6–15.9)
LYMPH#: 1.7 10*3/uL (ref 0.9–3.3)
LYMPH%: 26.8 % (ref 14.0–48.0)
MCH: 35.3 pg — AB (ref 26.0–34.0)
MCHC: 34.3 g/dL (ref 32.0–36.0)
MCV: 103 fL — AB (ref 81–101)
MONO#: 0.5 10*3/uL (ref 0.1–0.9)
MONO%: 7.1 % (ref 0.0–13.0)
NEUT%: 64.9 % (ref 39.6–80.0)
NEUTROS ABS: 4.2 10*3/uL (ref 1.5–6.5)
Platelets: 117 10*3/uL — ABNORMAL LOW (ref 145–400)
RBC: 3.91 10*6/uL (ref 3.70–5.32)
RDW: 12.2 % (ref 11.1–15.7)
WBC: 6.5 10*3/uL (ref 3.9–10.0)

## 2016-12-26 LAB — TECHNOLOGIST REVIEW CHCC SATELLITE

## 2016-12-26 LAB — LACTATE DEHYDROGENASE: LDH: 201 U/L (ref 125–245)

## 2016-12-26 MED ORDER — DIPHENHYDRAMINE HCL 25 MG PO CAPS
25.0000 mg | ORAL_CAPSULE | Freq: Once | ORAL | Status: AC
  Administered 2016-12-26: 25 mg via ORAL

## 2016-12-26 MED ORDER — ACETAMINOPHEN 325 MG PO TABS
650.0000 mg | ORAL_TABLET | Freq: Once | ORAL | Status: AC
  Administered 2016-12-26: 650 mg via ORAL

## 2016-12-26 MED ORDER — DIPHENHYDRAMINE HCL 25 MG PO CAPS
ORAL_CAPSULE | ORAL | Status: AC
  Filled 2016-12-26: qty 1

## 2016-12-26 MED ORDER — IMMUNE GLOBULIN (HUMAN) 20 GM/200ML IV SOLN
40.0000 g | Freq: Once | INTRAVENOUS | Status: AC
  Administered 2016-12-26: 20 g via INTRAVENOUS
  Filled 2016-12-26: qty 200

## 2016-12-26 MED ORDER — ACETAMINOPHEN 325 MG PO TABS
ORAL_TABLET | ORAL | Status: AC
  Filled 2016-12-26: qty 2

## 2016-12-26 NOTE — Patient Instructions (Signed)

## 2016-12-26 NOTE — Progress Notes (Signed)
Hematology and Oncology Follow Up Visit  Kristy [name] [id number] 11-05-1955 61 y.o. 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 She is doing well and staying busy with work and taking care of her sweet 4 mo old granddaughter. She is cherishing this time with her. She has started practicing yoga and is enjoying this. She is also walking for exercise.   She has maintained a good appetite and is staying well hydrated. Her weight is stable.  She has responded nicely to the IVIG. This has helped prevent her from developing any respiratory infections. Her sinusitis is much better. Her IgG level has remained down at 316. Levels today are pending.  She continues to do well on Imbruvica and has no complaint of side effects at this time.  No diarrhea or rashes on the skin. Her platelet count remains stable at 117. No anemia at this time.  She denies fever, chills, n/v, cough, dizziness, SOB, chest pain, palpitations, abdominal pain or changes in bowel or bladder habits.   She has occasional numbness and tingling in her toes off and on. This has not effected her gait or balance. She denies having had any falls or syncopal episodes.  No swelling or tenderness present in her extremities. No c/o pain at this time.   ECOG Performance Status: 1 - Symptomatic but completely ambulatory  Medications:  Allergies as of 12/26/2016   No Known Allergies     Medication List       Accurate as of 12/26/16  9:31 AM. Always use your most recent med list.          amoxicillin 500 MG capsule Commonly known as:  AMOXIL TAKE TWO CAPSULES AT ONCE, THEN TAKE ONE CAPSULE EVERY 6 HOURS UNTIL GONE   cholecalciferol 1000 units tablet Commonly known as:  VITAMIN D Take 2,000 Units by mouth  daily.   cyanocobalamin 1000 MCG/ML injection Commonly known as:  (VITAMIN B-12) Inject 1,000 mcg into the skin every 8 (eight) weeks.   IMBRUVICA 140 MG capsul Generic drug:  ibrutinib TAKE 3 CAPSULES (420 MG TOTAL) BY MOUTH DAILY.       Allergies: No Known Allergies  Past Medical History, Surgical history, Social history, and Family History were reviewed and updated.  Review of Systems: All other 10 point review of systems is negative.   Physical Exam:  weight is 116 lb (52.6 kg). Her oral temperature is 97.9 F (36.6 C). Her blood pressure is 126/61 and her pulse is 89. Her respiration is 18 and oxygen saturation is 98%.   Wt Readings from Last 3 Encounters:  12/26/16 116 lb (52.6 kg)  11/14/16 115 lb (52.2 kg)  10/12/16 115 lb (52.2 kg)    Ocular: Sclerae unicteric, pupils equal, round and reactive to light Ear-nose-throat: Oropharynx clear, dentition fair Lymphatic: No cervical, supraclavicular or axillary adenopathy Lungs no rales or rhonchi, good excursion bilaterally Heart regular rate and rhythm, no murmur appreciated Abd soft, nontender, positive bowel sounds, no liver or spleen tip palpated on exam, no fluid wave  MSK no focal spinal tenderness, no joint edema Neuro: non-focal, well-oriented, appropriate affect Breasts: Deferred   Lab Results  Component Value Date   WBC 6.5 12/26/2016   HGB 13.8 12/26/2016   HCT 40.2 12/26/2016   MCV 103 (H)  12/26/2016   PLT 117 (L) 12/26/2016   No results found for: FERRITIN, IRON, TIBC, UIBC, IRONPCTSAT Lab Results  Component Value Date   RETICCTPCT 2.9 (H) 10/30/2014   RBC 3.91 12/26/2016   RETICCTABS 98.3 10/30/2014   Lab Results  Component Value Date   KPAFRELGTCHN 14.00 (H) 02/15/2015   LAMBDASER 0.19 (L) 02/15/2015   KAPLAMBRATIO 15.94 (H) 11/14/2016   Lab Results  Component Value Date   IGGSERUM 316 (L) 11/14/2016   IGA 17 (L) 02/15/2015   IGMSERUM 761 (H) 11/14/2016   Lab Results  Component Value  Date   TOTALPROTELP 6.5 02/15/2015   ALBUMINELP 4.2 02/15/2015   A1GS 0.3 02/15/2015   A2GS 0.5 02/15/2015   BETS 0.4 02/15/2015   BETA2SER 0.2 02/15/2015   GAMS 1.0 02/15/2015   MSPIKE 0.4 (H) 11/14/2016   SPEI * 02/15/2015     Chemistry      Component Value Date/Time   NA 144 12/26/2016 0826   NA 139 03/16/2016 1337   K 4.0 12/26/2016 0826   K 3.8 03/16/2016 1337   CL 106 12/26/2016 0826   CO2 29 12/26/2016 0826   CO2 27 03/16/2016 1337   BUN 10 12/26/2016 0826   BUN 5.2 (L) 03/16/2016 1337   CREATININE 0.9 12/26/2016 0826   CREATININE 0.7 03/16/2016 1337      Component Value Date/Time   CALCIUM 9.2 12/26/2016 0826   CALCIUM 8.8 03/16/2016 1337   ALKPHOS 60 12/26/2016 0826   ALKPHOS 62 03/16/2016 1337   AST 28 12/26/2016 0826   AST 24 03/16/2016 1337   ALT 20 12/26/2016 0826   ALT 19 03/16/2016 1337   BILITOT 1.30 12/26/2016 0826   BILITOT 0.57 03/16/2016 1337      Impression and Plan: Kristy [name] is a very pleasant 61 yo caucasian female with Waldenstrom's macroglobulinemia as well as acquired hypogammaglobulinemia.  She is tolerating the Imbruvica nicely and has no complaints at this time. Her last IgM level was 761.  We will proceed with IVIG today as planned. This has helped correct her sinusitis and prevent recurrent respiratory infections.  We will plan to see her back again in another 6 weeks for repeat lab work, follow-up and infusion.  She will contact our office with any questions or concerns. We can certainly see her sooner if need be.   Eliezer Bottom, NP 10/9/20189:31 AM

## 2016-12-27 ENCOUNTER — Encounter: Payer: Self-pay | Admitting: *Deleted

## 2016-12-27 LAB — IGG, IGA, IGM
IGM (IMMUNOGLOBIN M), SRM: 638 mg/dL — AB (ref 26–217)
IgA, Qn, Serum: 16 mg/dL — ABNORMAL LOW (ref 87–352)
IgG, Qn, Serum: 600 mg/dL — ABNORMAL LOW (ref 700–1600)

## 2016-12-28 ENCOUNTER — Other Ambulatory Visit: Payer: Self-pay | Admitting: Family

## 2016-12-28 MED FILL — CYANOCOBALAMIN 1,000 MCG/ML: 1000 | 60 days supply | Qty: 1 | Fill #0

## 2016-12-28 MED FILL — IMBRUVICA 140 MG CAPSULE: 140 | 30 days supply | Qty: 90 | Fill #0

## 2017-01-01 LAB — PROTEIN ELECTROPHORESIS, SERUM, WITH REFLEX
A/G RATIO SPE: 1.2 (ref 0.7–1.7)
ALBUMIN: 3.7 g/dL (ref 2.9–4.4)
Alpha 1: 0.2 g/dL (ref 0.0–0.4)
Alpha 2: 0.6 g/dL (ref 0.4–1.0)
Beta: 0.9 g/dL (ref 0.7–1.3)
GLOBULIN, TOTAL: 3 g/dL (ref 2.2–3.9)
Gamma Globulin: 1.1 g/dL (ref 0.4–1.8)
INTERPRETATION(SEE BELOW): 0
M-Spike, %: 0.6 g/dL — ABNORMAL HIGH
TOTAL PROTEIN: 6.7 g/dL (ref 6.0–8.5)

## 2017-01-31 MED FILL — IMBRUVICA 140 MG CAPSULE: 140 | 30 days supply | Qty: 90 | Fill #1

## 2017-02-06 ENCOUNTER — Other Ambulatory Visit: Payer: Self-pay

## 2017-02-06 ENCOUNTER — Ambulatory Visit (HOSPITAL_BASED_OUTPATIENT_CLINIC_OR_DEPARTMENT_OTHER): Payer: Federal, State, Local not specified - PPO | Admitting: Family

## 2017-02-06 ENCOUNTER — Ambulatory Visit (HOSPITAL_BASED_OUTPATIENT_CLINIC_OR_DEPARTMENT_OTHER): Payer: Federal, State, Local not specified - PPO

## 2017-02-06 ENCOUNTER — Other Ambulatory Visit (HOSPITAL_BASED_OUTPATIENT_CLINIC_OR_DEPARTMENT_OTHER): Payer: Federal, State, Local not specified - PPO

## 2017-02-06 VITALS — BP 125/56 | HR 100 | Temp 97.5°F | Resp 18

## 2017-02-06 VITALS — BP 138/81 | HR 74 | Temp 97.7°F | Resp 16 | Wt 118.8 lb

## 2017-02-06 DIAGNOSIS — C8800 Waldenstrom macroglobulinemia not having achieved remission: Secondary | ICD-10-CM

## 2017-02-06 DIAGNOSIS — D801 Nonfamilial hypogammaglobulinemia: Secondary | ICD-10-CM

## 2017-02-06 DIAGNOSIS — J0101 Acute recurrent maxillary sinusitis: Secondary | ICD-10-CM

## 2017-02-06 LAB — CBC WITH DIFFERENTIAL (CANCER CENTER ONLY)
BASO#: 0 10*3/uL (ref 0.0–0.2)
BASO%: 0.2 % (ref 0.0–2.0)
EOS%: 1.7 % (ref 0.0–7.0)
Eosinophils Absolute: 0.1 10*3/uL (ref 0.0–0.5)
HCT: 37.1 % (ref 34.8–46.6)
HEMOGLOBIN: 12.7 g/dL (ref 11.6–15.9)
LYMPH#: 1.3 10*3/uL (ref 0.9–3.3)
LYMPH%: 25.5 % (ref 14.0–48.0)
MCH: 35.4 pg — ABNORMAL HIGH (ref 26.0–34.0)
MCHC: 34.2 g/dL (ref 32.0–36.0)
MCV: 103 fL — ABNORMAL HIGH (ref 81–101)
MONO#: 0.4 10*3/uL (ref 0.1–0.9)
MONO%: 7.8 % (ref 0.0–13.0)
NEUT%: 64.8 % (ref 39.6–80.0)
NEUTROS ABS: 3.4 10*3/uL (ref 1.5–6.5)
PLATELETS: 106 10*3/uL — AB (ref 145–400)
RBC: 3.59 10*6/uL — ABNORMAL LOW (ref 3.70–5.32)
RDW: 12.9 % (ref 11.1–15.7)
WBC: 5.3 10*3/uL (ref 3.9–10.0)

## 2017-02-06 LAB — CMP (CANCER CENTER ONLY)
ALBUMIN: 4 g/dL (ref 3.3–5.5)
ALT(SGPT): 22 U/L (ref 10–47)
AST: 24 U/L (ref 11–38)
Alkaline Phosphatase: 50 U/L (ref 26–84)
BILIRUBIN TOTAL: 1.2 mg/dL (ref 0.20–1.60)
BUN: 9 mg/dL (ref 7–22)
CO2: 27 meq/L (ref 18–33)
CREATININE: 0.8 mg/dL (ref 0.6–1.2)
Calcium: 8.7 mg/dL (ref 8.0–10.3)
Chloride: 105 mEq/L (ref 98–108)
GLUCOSE: 92 mg/dL (ref 73–118)
Potassium: 3.7 mEq/L (ref 3.3–4.7)
SODIUM: 143 meq/L (ref 128–145)
Total Protein: 6.2 g/dL — ABNORMAL LOW (ref 6.4–8.1)

## 2017-02-06 LAB — LACTATE DEHYDROGENASE: LDH: 200 U/L (ref 125–245)

## 2017-02-06 MED ORDER — IMMUNE GLOBULIN (HUMAN) 20 GM/200ML IV SOLN
40.0000 g | Freq: Once | INTRAVENOUS | Status: AC
  Administered 2017-02-06: 40 g via INTRAVENOUS
  Filled 2017-02-06: qty 400

## 2017-02-06 MED ORDER — ACETAMINOPHEN 325 MG PO TABS
ORAL_TABLET | ORAL | Status: AC
  Filled 2017-02-06: qty 2

## 2017-02-06 MED ORDER — ACETAMINOPHEN 325 MG PO TABS
650.0000 mg | ORAL_TABLET | Freq: Once | ORAL | Status: AC
  Administered 2017-02-06: 650 mg via ORAL

## 2017-02-06 MED ORDER — DIPHENHYDRAMINE HCL 25 MG PO CAPS
25.0000 mg | ORAL_CAPSULE | Freq: Once | ORAL | Status: AC
  Administered 2017-02-06: 25 mg via ORAL

## 2017-02-06 MED ORDER — DIPHENHYDRAMINE HCL 25 MG PO CAPS
ORAL_CAPSULE | ORAL | Status: AC
  Filled 2017-02-06: qty 1

## 2017-02-06 NOTE — Progress Notes (Signed)
Patient refuses to stay for the 30 minutes post IVIG observation.

## 2017-02-06 NOTE — Progress Notes (Signed)
Hematology and Oncology Follow Up Visit  Kristy [name] [id number] 1955/10/19 61 y.o. 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  She is doing well and getting ready for Thanksgiving with her family.  She continues to stay busy and exercise throughout the week.  She is doing well on Imbruvica and has no complaints at this time.  No episodes of bleeding, bruising or petechiae. No lymphadenopathy found on exam.  She has maintained a good appetite and is staying well hydrated. She is vegan and doing well eating a balanced diet. Her weight is stable.  No fever, chills, n/v, cough, rash, dizziness, SOB, chest pain, palpitations, abdominal pain or changes in bowel or bladder habits.  No swelling, tenderness, numbness or tingling in her extremities at this time. No c/o pain.  No falls or syncope.   ECOG Performance Status: 1 - Symptomatic but completely ambulatory  Medications:  Allergies as of 02/06/2017   No Known Allergies     Medication List        Accurate as of 02/06/17  3:34 PM. Always use your most recent med list.          amoxicillin 500 MG capsule Commonly known as:  AMOXIL TAKE TWO CAPSULES AT ONCE, THEN TAKE ONE CAPSULE EVERY 6 HOURS UNTIL GONE   cholecalciferol 1000 units tablet Commonly known as:  VITAMIN D Take 2,000 Units by mouth daily.   cyanocobalamin 1000 MCG/ML injection Commonly known as:  (VITAMIN B-12) Inject 1,000 mcg into the skin every 8 (eight) weeks.   IMBRUVICA 140 MG capsul Generic drug:  ibrutinib TAKE 3 CAPSULES (420 MG TOTAL) BY MOUTH DAILY.       Allergies: No Known Allergies  Past Medical History, Surgical history, Social history, and Family History were reviewed and updated.  Review of  Systems: All other 10 point review of systems is negative.   Physical Exam:  weight is 118 lb 12.8 oz (53.9 kg). Her oral temperature is 97.7 F (36.5 C). Her blood pressure is 138/81 and her pulse is 74. Her respiration is 16 and oxygen saturation is 98%.   Wt Readings from Last 3 Encounters:  02/06/17 118 lb 12.8 oz (53.9 kg)  12/26/16 116 lb (52.6 kg)  11/14/16 115 lb (52.2 kg)    Ocular: Sclerae unicteric, pupils equal, round and reactive to light Ear-nose-throat: Oropharynx clear, dentition fair Lymphatic: No cervical, supraclavicular or axillary adenopathy Lungs no rales or rhonchi, good excursion bilaterally Heart regular rate and rhythm, no murmur appreciated Abd soft, nontender, positive bowel sounds, no liver or spleen tip palpated on exam, no fluid wave  MSK no focal spinal tenderness, no joint edema Neuro: non-focal, well-oriented, appropriate affect Breasts: Deferred   Lab Results  Component Value Date   WBC 5.3 02/06/2017   HGB 12.7 02/06/2017   HCT 37.1 02/06/2017   MCV 103 (H) 02/06/2017   PLT 106 (L) 02/06/2017   No results found for: FERRITIN, IRON, TIBC, UIBC, IRONPCTSAT Lab Results  Component Value Date   RETICCTPCT 2.9 (H) 10/30/2014   RBC 3.59 (L) 02/06/2017   RETICCTABS 98.3 10/30/2014   Lab Results  Component Value Date   KPAFRELGTCHN 14.00 (H) 02/15/2015   LAMBDASER 0.19 (L) 02/15/2015   KAPLAMBRATIO 15.94 (H) 11/14/2016   Lab Results  Component Value Date   IGGSERUM 600 (L) 12/26/2016   IGA 17 (L) 02/15/2015   IGMSERUM 638 (H) 12/26/2016   Lab Results  Component Value Date   TOTALPROTELP 6.5 02/15/2015   ALBUMINELP 4.2 02/15/2015   A1GS 0.3 02/15/2015   A2GS 0.5 02/15/2015   BETS 0.4 02/15/2015   BETA2SER 0.2 02/15/2015   GAMS 1.0 02/15/2015   MSPIKE 0.6 (H) 12/26/2016   SPEI * 02/15/2015     Chemistry      Component Value Date/Time   NA 143 02/06/2017 0829   NA 139 03/16/2016 1337   K 3.7 02/06/2017 0829   K 3.8 03/16/2016  1337   CL 105 02/06/2017 0829   CO2 27 02/06/2017 0829   CO2 27 03/16/2016 1337   BUN 9 02/06/2017 0829   BUN 5.2 (L) 03/16/2016 1337   CREATININE 0.8 02/06/2017 0829   CREATININE 0.7 03/16/2016 1337      Component Value Date/Time   CALCIUM 8.7 02/06/2017 0829   CALCIUM 8.8 03/16/2016 1337   ALKPHOS 50 02/06/2017 0829   ALKPHOS 62 03/16/2016 1337   AST 24 02/06/2017 0829   AST 24 03/16/2016 1337   ALT 22 02/06/2017 0829   ALT 19 03/16/2016 1337   BILITOT 1.20 02/06/2017 0829   BILITOT 0.57 03/16/2016 1337      Impression and Plan: Kristy [name] is a very pleasant 61 yo caucasian female with Waldenstrom's macroglobulinemia as well as acquired hypogammaglobulinemia.  She is doing well with Imbruvica and has no complaints at this time.  She has also done well with IVIG and this has significantly improved her sinusitis and prevented development of respiratory infection.  We will proceed with infusion today as planned.  We will plan to see her back again in another 6 weeks for follow-up, lab and infusion. She will contact our office with any questions or concerns. We can certainly see her sooner if need be.   Eliezer Bottom, NP 11/20/20183:34 PM

## 2017-02-06 NOTE — Patient Instructions (Signed)

## 2017-02-07 LAB — PROTEIN ELECTROPHORESIS, SERUM
A/G Ratio: 1.5 (ref 0.7–1.7)
ALPHA 1: 0.2 g/dL (ref 0.0–0.4)
ALPHA 2: 0.5 g/dL (ref 0.4–1.0)
Albumin: 3.7 g/dL (ref 2.9–4.4)
BETA: 0.8 g/dL (ref 0.7–1.3)
GLOBULIN, TOTAL: 2.5 g/dL (ref 2.2–3.9)
Gamma Globulin: 1 g/dL (ref 0.4–1.8)
M-Spike, %: 0.5 g/dL — ABNORMAL HIGH
Total Protein: 6.2 g/dL (ref 6.0–8.5)

## 2017-02-07 LAB — KAPPA/LAMBDA LIGHT CHAINS
IG LAMBDA FREE LIGHT CHAIN: 2.7 mg/L — AB (ref 5.7–26.3)
Ig Kappa Free Light Chain: 49.3 mg/L — ABNORMAL HIGH (ref 3.3–19.4)
Kappa/Lambda FluidC Ratio: 18.26 — ABNORMAL HIGH (ref 0.26–1.65)

## 2017-02-07 LAB — IGG, IGA, IGM
IGA/IMMUNOGLOBULIN A, SERUM: 15 mg/dL — AB (ref 87–352)
IGG (IMMUNOGLOBIN G), SERUM: 619 mg/dL — AB (ref 700–1600)
IgM, Qn, Serum: 547 mg/dL — ABNORMAL HIGH (ref 26–217)

## 2017-02-22 ENCOUNTER — Other Ambulatory Visit: Payer: Self-pay | Admitting: Family

## 2017-02-27 MED FILL — IMBRUVICA 140 MG CAPSULE: 140 | 30 days supply | Qty: 90 | Fill #0

## 2017-02-27 MED FILL — CYANOCOBALAMIN 1,000 MCG/ML: 1000 | 60 days supply | Qty: 1 | Fill #1

## 2017-03-19 ENCOUNTER — Other Ambulatory Visit: Payer: Self-pay

## 2017-03-19 ENCOUNTER — Ambulatory Visit (HOSPITAL_BASED_OUTPATIENT_CLINIC_OR_DEPARTMENT_OTHER): Payer: Federal, State, Local not specified - PPO | Admitting: Hematology & Oncology

## 2017-03-19 ENCOUNTER — Ambulatory Visit (HOSPITAL_BASED_OUTPATIENT_CLINIC_OR_DEPARTMENT_OTHER): Payer: Federal, State, Local not specified - PPO

## 2017-03-19 ENCOUNTER — Encounter: Payer: Self-pay | Admitting: Hematology & Oncology

## 2017-03-19 ENCOUNTER — Other Ambulatory Visit (HOSPITAL_BASED_OUTPATIENT_CLINIC_OR_DEPARTMENT_OTHER): Payer: Federal, State, Local not specified - PPO

## 2017-03-19 VITALS — BP 126/74 | HR 84 | Temp 97.7°F | Resp 14 | Wt 117.4 lb

## 2017-03-19 VITALS — BP 100/54 | HR 86 | Temp 98.4°F | Resp 20

## 2017-03-19 DIAGNOSIS — D801 Nonfamilial hypogammaglobulinemia: Secondary | ICD-10-CM

## 2017-03-19 DIAGNOSIS — J0101 Acute recurrent maxillary sinusitis: Secondary | ICD-10-CM

## 2017-03-19 DIAGNOSIS — C880 Waldenstrom macroglobulinemia: Secondary | ICD-10-CM

## 2017-03-19 LAB — CBC WITH DIFFERENTIAL (CANCER CENTER ONLY)
BASO#: 0 10*3/uL (ref 0.0–0.2)
BASO%: 0.3 % (ref 0.0–2.0)
EOS ABS: 0.1 10*3/uL (ref 0.0–0.5)
EOS%: 1.5 % (ref 0.0–7.0)
HEMATOCRIT: 40.9 % (ref 34.8–46.6)
HEMOGLOBIN: 13.9 g/dL (ref 11.6–15.9)
LYMPH#: 1.8 10*3/uL (ref 0.9–3.3)
LYMPH%: 28.8 % (ref 14.0–48.0)
MCH: 35.5 pg — AB (ref 26.0–34.0)
MCHC: 34 g/dL (ref 32.0–36.0)
MCV: 105 fL — ABNORMAL HIGH (ref 81–101)
MONO#: 0.4 10*3/uL (ref 0.1–0.9)
MONO%: 6.5 % (ref 0.0–13.0)
NEUT%: 62.9 % (ref 39.6–80.0)
NEUTROS ABS: 3.8 10*3/uL (ref 1.5–6.5)
Platelets: 126 10*3/uL — ABNORMAL LOW (ref 145–400)
RBC: 3.91 10*6/uL (ref 3.70–5.32)
RDW: 12.5 % (ref 11.1–15.7)
WBC: 6.1 10*3/uL (ref 3.9–10.0)

## 2017-03-19 LAB — CMP (CANCER CENTER ONLY)
ALBUMIN: 4.1 g/dL (ref 3.3–5.5)
ALT(SGPT): 23 U/L (ref 10–47)
AST: 25 U/L (ref 11–38)
Alkaline Phosphatase: 53 U/L (ref 26–84)
BUN, Bld: 11 mg/dL (ref 7–22)
CALCIUM: 9.6 mg/dL (ref 8.0–10.3)
CHLORIDE: 108 meq/L (ref 98–108)
CO2: 31 meq/L (ref 18–33)
Creat: 1 mg/dl (ref 0.6–1.2)
GLUCOSE: 82 mg/dL (ref 73–118)
Potassium: 4.5 mEq/L (ref 3.3–4.7)
Sodium: 147 mEq/L — ABNORMAL HIGH (ref 128–145)
Total Bilirubin: 1.1 mg/dl (ref 0.20–1.60)
Total Protein: 6.8 g/dL (ref 6.4–8.1)

## 2017-03-19 LAB — LACTATE DEHYDROGENASE: LDH: 226 U/L (ref 125–245)

## 2017-03-19 MED ORDER — DIPHENHYDRAMINE HCL 25 MG PO CAPS
25.0000 mg | ORAL_CAPSULE | Freq: Once | ORAL | Status: AC
  Administered 2017-03-19: 25 mg via ORAL

## 2017-03-19 MED ORDER — ACETAMINOPHEN 325 MG PO TABS
ORAL_TABLET | ORAL | Status: AC
  Filled 2017-03-19: qty 2

## 2017-03-19 MED ORDER — DIPHENHYDRAMINE HCL 25 MG PO CAPS
ORAL_CAPSULE | ORAL | Status: AC
  Filled 2017-03-19: qty 1

## 2017-03-19 MED ORDER — SODIUM CHLORIDE 0.9 % IV SOLN
INTRAVENOUS | Status: DC
  Administered 2017-03-19: 10:00:00 via INTRAVENOUS

## 2017-03-19 MED ORDER — IMMUNE GLOBULIN (HUMAN) 20 GM/200ML IV SOLN
40.0000 g | Freq: Once | INTRAVENOUS | Status: AC
  Administered 2017-03-19: 40 g via INTRAVENOUS
  Filled 2017-03-19: qty 400

## 2017-03-19 MED ORDER — DEXTROSE 5 % IV SOLN
INTRAVENOUS | Status: DC
  Administered 2017-03-19: 11:00:00 via INTRAVENOUS

## 2017-03-19 MED ORDER — ACETAMINOPHEN 325 MG PO TABS
650.0000 mg | ORAL_TABLET | Freq: Once | ORAL | Status: AC
  Administered 2017-03-19: 650 mg via ORAL

## 2017-03-19 NOTE — Patient Instructions (Signed)

## 2017-03-19 NOTE — Progress Notes (Signed)
Hematology and Oncology Follow Up Visit  Kristy [name] [id number] November 09, 1955 61 y.o. 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  She is doing quite well.  She had a really nice Christmas.  Her granddaughter is doing well.  She is just little bit too young to understand Christmas.  Her IgM level keeps coming down.  On her last checked, her IgM level was down to 547 mg/dL.  Her M spike was 0.5 g/dL.  She has had some problems with bleeding from the right nares.  I do not think this is anything with her sinuses.  I think this is more anatomic with her vasculature.  I told her that if this continues, her ENT doctor could probably cauterize this.  She has had no fever.  She is still working.  She is quite busy at work.  She has had no cough or shortness of breath.  Patient had no rashes.  There is been no tingling in the hands or feet.  ECOG Performance Status: 1 - Symptomatic but completely ambulatory  Medications:  Allergies as of 03/19/2017   No Known Allergies     Medication List        Accurate as of 03/19/17  9:55 AM. Always use your most recent med list.          cholecalciferol 1000 units tablet Commonly known as:  VITAMIN D Take 2,000 Units by mouth daily.   cyanocobalamin 1000 MCG/ML injection Commonly known as:  (VITAMIN B-12) Inject 1,000 mcg into the skin every 8 (eight) weeks.   IMBRUVICA 140 MG capsul Generic drug:  ibrutinib TAKE 3 CAPSULES (420 MG TOTAL) BY MOUTH DAILY.       Allergies: No Known Allergies  Past Medical History, Surgical history, Social history, and Family History were reviewed and updated.  Review of Systems: Review of Systems  All other systems reviewed and are negative.   Physical  Exam:  weight is 117 lb 6.4 oz (53.3 kg). Her oral temperature is 97.7 F (36.5 C). Her blood pressure is 126/74 and her pulse is 84. Her respiration is 14 and oxygen saturation is 99%.   Wt Readings from Last 3 Encounters:  03/19/17 117 lb 6.4 oz (53.3 kg)  02/06/17 118 lb 12.8 oz (53.9 kg)  12/26/16 116 lb (52.6 kg)    Physical Exam  Constitutional: She is oriented to person, place, and time.  HENT:  Head: Normocephalic and atraumatic.  Mouth/Throat: Oropharynx is clear and moist.  Eyes: EOM are normal. Pupils are equal, round, and reactive to light.  Neck: Normal range of motion.  Cardiovascular: Normal rate, regular rhythm and normal heart sounds.  Pulmonary/Chest: Effort normal and breath sounds normal.  Abdominal: Soft. Bowel sounds are normal.  Musculoskeletal: Normal range of motion. She exhibits no edema, tenderness or deformity.  Lymphadenopathy:    She has no cervical adenopathy.  Neurological: She is alert and oriented to person, place, and time.  Skin: Skin is warm and dry. No rash noted. No erythema.  Psychiatric: She has a normal mood and affect. Her behavior is normal. Judgment and thought content normal.  Vitals reviewed.    Lab Results  Component Value Date   WBC 6.1 03/19/2017   HGB 13.9 03/19/2017   HCT 40.9 03/19/2017  MCV 105 (H) 03/19/2017   PLT 126 (L) 03/19/2017   No results found for: FERRITIN, IRON, TIBC, UIBC, IRONPCTSAT Lab Results  Component Value Date   RETICCTPCT 2.9 (H) 10/30/2014   RBC 3.91 03/19/2017   RETICCTABS 98.3 10/30/2014   Lab Results  Component Value Date   KPAFRELGTCHN 14.00 (H) 02/15/2015   LAMBDASER 0.19 (L) 02/15/2015   KAPLAMBRATIO 18.26 (H) 02/06/2017   Lab Results  Component Value Date   IGGSERUM 619 (L) 02/06/2017   IGA 17 (L) 02/15/2015   IGMSERUM 547 (H) 02/06/2017   Lab Results  Component Value Date   TOTALPROTELP 6.5 02/15/2015   ALBUMINELP 4.2 02/15/2015   A1GS 0.3 02/15/2015   A2GS 0.5 02/15/2015     BETS 0.4 02/15/2015   BETA2SER 0.2 02/15/2015   GAMS 1.0 02/15/2015   MSPIKE 0.5 (H) 02/06/2017   SPEI * 02/15/2015     Chemistry      Component Value Date/Time   NA 147 (H) 03/19/2017 0849   NA 139 03/16/2016 1337   K 4.5 03/19/2017 0849   K 3.8 03/16/2016 1337   CL 108 03/19/2017 0849   CO2 31 03/19/2017 0849   CO2 27 03/16/2016 1337   BUN 11 03/19/2017 0849   BUN 5.2 (L) 03/16/2016 1337   CREATININE 1.0 03/19/2017 0849   CREATININE 0.7 03/16/2016 1337      Component Value Date/Time   CALCIUM 9.6 03/19/2017 0849   CALCIUM 8.8 03/16/2016 1337   ALKPHOS 53 03/19/2017 0849   ALKPHOS 62 03/16/2016 1337   AST 25 03/19/2017 0849   AST 24 03/16/2016 1337   ALT 23 03/19/2017 0849   ALT 19 03/16/2016 1337   BILITOT 1.10 03/19/2017 0849   BILITOT 0.57 03/16/2016 1337      Impression and Plan: Kristy [name] is a very pleasant 61 yo caucasian female with Waldenstrom's macroglobulinemia as well as acquired hypogammaglobulinemia.   She is doing well with Imbruvica and has no complaints at this time. +  For right now, we will go ahead with the IVIG.  Hopefully, we can move her IVIG out to every 2 months.  I think this would be reasonable.    [name], MD 12/31/20189:55 AM

## 2017-03-19 NOTE — Progress Notes (Signed)
1345-Pt refuses to stay for 30 minute post IVIG observation and is without complaints at this time.

## 2017-03-20 LAB — IGG, IGA, IGM
IGM (IMMUNOGLOBIN M), SRM: 546 mg/dL — AB (ref 26–217)
IgA, Qn, Serum: 16 mg/dL — ABNORMAL LOW (ref 87–352)
IgG, Qn, Serum: 656 mg/dL — ABNORMAL LOW (ref 700–1600)

## 2017-03-21 ENCOUNTER — Ambulatory Visit: Payer: Federal, State, Local not specified - PPO | Admitting: Hematology & Oncology

## 2017-03-21 ENCOUNTER — Other Ambulatory Visit: Payer: Federal, State, Local not specified - PPO

## 2017-03-21 ENCOUNTER — Ambulatory Visit: Payer: Federal, State, Local not specified - PPO

## 2017-03-21 LAB — KAPPA/LAMBDA LIGHT CHAINS
IG LAMBDA FREE LIGHT CHAIN: 2.5 mg/L — AB (ref 5.7–26.3)
Ig Kappa Free Light Chain: 45.5 mg/L — ABNORMAL HIGH (ref 3.3–19.4)
KAPPA/LAMBDA FLC RATIO: 18.2 — AB (ref 0.26–1.65)

## 2017-03-23 LAB — PROTEIN ELECTROPHORESIS, SERUM, WITH REFLEX
A/G RATIO SPE: 1.4 (ref 0.7–1.7)
ALBUMIN: 3.7 g/dL (ref 2.9–4.4)
Alpha 1: 0.2 g/dL (ref 0.0–0.4)
Alpha 2: 0.5 g/dL (ref 0.4–1.0)
BETA: 0.9 g/dL (ref 0.7–1.3)
GAMMA GLOBULIN: 1.1 g/dL (ref 0.4–1.8)
GLOBULIN, TOTAL: 2.7 g/dL (ref 2.2–3.9)
Interpretation(See Below): 0
M-Spike, %: 0.6 g/dL — ABNORMAL HIGH
TOTAL PROTEIN: 6.4 g/dL (ref 6.0–8.5)

## 2017-03-23 MED FILL — IMBRUVICA 140 MG CAPSULE: 140 | 30 days supply | Qty: 90 | Fill #1

## 2017-04-04 ENCOUNTER — Telehealth: Payer: Self-pay | Admitting: Pharmacist

## 2017-04-04 NOTE — Telephone Encounter (Signed)
Oral Chemotherapy Pharmacist Encounter  Follow-Up Form  Called patient today to follow up regarding patient's oral chemotherapy medication: Imbruvica (ibrutinib)  Original Start date of oral chemotherapy: 01/2014   Pt reports 0 tablets/doses of Imbruvica (ibrutinib) missed in the last month. Reviewed plan for missed doses.   Pt reports the following side effects: N/A  Recent labs reviewed: IgM from 03/19/17  New medications?: none reported.   Other Issues: Will try and change Ms. Levengood to single 420mg  tablet from three 140mg  capsule  Patient knows to call the office with questions or concerns. Oral Oncology Clinic will continue to follow.  Darl Pikes, PharmD, BCPS Hematology/Oncology Clinical Pharmacist [facility] [phone]  04/04/2017 11:54 AM

## 2017-04-04 NOTE — Telephone Encounter (Signed)
Oral Chemotherapy Pharmacist Encounter   Attempted to reach patient for follow up on oral medication: Imbruvica (ibrutinib). No answer. Left VM for patient to call back.    Darl Pikes, PharmD, BCPS Hematology/Oncology Clinical Pharmacist [facility] [phone]  04/04/2017 10:39 AM

## 2017-04-17 ENCOUNTER — Other Ambulatory Visit: Payer: Self-pay | Admitting: Family

## 2017-04-19 MED FILL — IMBRUVICA 140 MG CAPSULE: 140 | 30 days supply | Qty: 90 | Fill #0

## 2017-05-01 ENCOUNTER — Inpatient Hospital Stay: Payer: Federal, State, Local not specified - PPO

## 2017-05-01 ENCOUNTER — Encounter: Payer: Self-pay | Admitting: Hematology & Oncology

## 2017-05-01 ENCOUNTER — Inpatient Hospital Stay: Payer: Federal, State, Local not specified - PPO | Attending: Hematology & Oncology | Admitting: Hematology & Oncology

## 2017-05-01 ENCOUNTER — Other Ambulatory Visit: Payer: Self-pay

## 2017-05-01 VITALS — BP 126/73 | HR 92 | Temp 98.1°F | Resp 18 | Wt 118.0 lb

## 2017-05-01 VITALS — BP 131/63 | HR 79 | Temp 98.0°F | Resp 20

## 2017-05-01 DIAGNOSIS — Z79899 Other long term (current) drug therapy: Secondary | ICD-10-CM | POA: Diagnosis not present

## 2017-05-01 DIAGNOSIS — D801 Nonfamilial hypogammaglobulinemia: Secondary | ICD-10-CM | POA: Diagnosis not present

## 2017-05-01 DIAGNOSIS — J329 Chronic sinusitis, unspecified: Secondary | ICD-10-CM | POA: Diagnosis not present

## 2017-05-01 DIAGNOSIS — C8800 Waldenstrom macroglobulinemia not having achieved remission: Secondary | ICD-10-CM

## 2017-05-01 DIAGNOSIS — J0101 Acute recurrent maxillary sinusitis: Secondary | ICD-10-CM

## 2017-05-01 LAB — CMP (CANCER CENTER ONLY)
ALK PHOS: 62 U/L (ref 26–84)
ALT: 23 U/L (ref 0–55)
ANION GAP: 6 (ref 5–15)
AST: 27 U/L (ref 5–34)
Albumin: 3.9 g/dL (ref 3.5–5.0)
BUN: 10 mg/dL (ref 7–22)
CHLORIDE: 107 mmol/L (ref 98–108)
CO2: 30 mmol/L (ref 18–33)
Calcium: 8.6 mg/dL (ref 8.0–10.3)
Creatinine: 0.8 mg/dL (ref 0.60–1.10)
Glucose, Bld: 94 mg/dL (ref 73–118)
POTASSIUM: 3.5 mmol/L (ref 3.3–4.7)
SODIUM: 143 mmol/L (ref 128–145)
TOTAL PROTEIN: 6.4 g/dL (ref 6.4–8.1)
Total Bilirubin: 1.2 mg/dL (ref 0.2–1.2)

## 2017-05-01 LAB — CBC WITH DIFFERENTIAL (CANCER CENTER ONLY)
Basophils Absolute: 0 10*3/uL (ref 0.0–0.1)
Basophils Relative: 0 %
EOS ABS: 0.1 10*3/uL (ref 0.0–0.5)
EOS PCT: 1 %
HCT: 37.8 % (ref 34.8–46.6)
HEMOGLOBIN: 12.6 g/dL (ref 11.6–15.9)
LYMPHS ABS: 1.4 10*3/uL (ref 0.9–3.3)
LYMPHS PCT: 26 %
MCH: 34.8 pg — ABNORMAL HIGH (ref 26.0–34.0)
MCHC: 33.3 g/dL (ref 32.0–36.0)
MCV: 104.4 fL — AB (ref 81.0–101.0)
MONOS PCT: 8 %
Monocytes Absolute: 0.5 10*3/uL (ref 0.1–0.9)
NEUTROS PCT: 65 %
Neutro Abs: 3.5 10*3/uL (ref 1.5–6.5)
Platelet Count: 95 10*3/uL — ABNORMAL LOW (ref 145–400)
RBC: 3.62 MIL/uL — AB (ref 3.70–5.32)
RDW: 12.5 % (ref 11.1–15.7)
WBC: 5.5 10*3/uL (ref 3.9–10.0)

## 2017-05-01 MED ORDER — IMMUNE GLOBULIN (HUMAN) 20 GM/200ML IV SOLN
40.0000 g | Freq: Once | INTRAVENOUS | Status: AC
  Administered 2017-05-01: 40 g via INTRAVENOUS
  Filled 2017-05-01: qty 400

## 2017-05-01 MED ORDER — ACETAMINOPHEN 325 MG PO TABS
ORAL_TABLET | ORAL | Status: AC
  Filled 2017-05-01: qty 2

## 2017-05-01 MED ORDER — DIPHENHYDRAMINE HCL 25 MG PO CAPS
25.0000 mg | ORAL_CAPSULE | Freq: Once | ORAL | Status: AC
  Administered 2017-05-01: 25 mg via ORAL

## 2017-05-01 MED ORDER — IBRUTINIB 420 MG PO TABS: 420.0000 mg | ORAL_TABLET | Freq: Every day | ORAL | 12 refills | Status: DC

## 2017-05-01 MED ORDER — DIPHENHYDRAMINE HCL 25 MG PO CAPS
ORAL_CAPSULE | ORAL | Status: AC
  Filled 2017-05-01: qty 1

## 2017-05-01 MED ORDER — SODIUM CHLORIDE 0.9 % IV SOLN
INTRAVENOUS | Status: DC
  Administered 2017-05-01: 10:00:00 via INTRAVENOUS

## 2017-05-01 MED ORDER — ACETAMINOPHEN 325 MG PO TABS
650.0000 mg | ORAL_TABLET | Freq: Once | ORAL | Status: AC
  Administered 2017-05-01: 650 mg via ORAL

## 2017-05-01 NOTE — Progress Notes (Signed)
Pt refused to stay for 30 minute post observation.  Pt without complaints at time of discharge.  

## 2017-05-01 NOTE — Progress Notes (Signed)
Hematology and Oncology Follow Up Visit  Kristy [name] [id number] [birth year] 62 y.o. 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  She is doing quite well.  She still working.  She is having no problems with fatigue or weakness.  Her sinuses have been doing better.  Her IVIG has helped.  Her Waldenstrm's levels are also stable.  Her IgM level was 546 mg/dL.  Her M spike was 0.6 g/dL.  She has had no nausea or vomiting.  She is had no change in bowel or bladder habits.  She has had no rashes.  She is had no headache.  Overall, her performance status is ECOG 0.  Medications:  Allergies as of 05/01/2017   No Known Allergies     Medication List        Accurate as of 05/01/17  9:46 AM. Always use your most recent med list.          cholecalciferol 1000 units tablet Commonly known as:  VITAMIN D Take 2,000 Units by mouth daily.   cyanocobalamin 1000 MCG/ML injection Commonly known as:  (VITAMIN B-12) Inject 1,000 mcg into the skin every 8 (eight) weeks.   IMBRUVICA 140 MG capsul Generic drug:  ibrutinib TAKE 3 CAPSULES (420 MG TOTAL) BY MOUTH DAILY.       Allergies: No Known Allergies  Past Medical History, Surgical history, Social history, and Family History were reviewed and updated.  Review of Systems: Review of Systems  Constitutional: Negative.   HENT: Positive for congestion.   Eyes: Negative.   Respiratory: Negative.   Cardiovascular: Negative.   Gastrointestinal: Negative.   Genitourinary: Negative.   Musculoskeletal: Negative.   Skin: Negative.   Neurological: Negative.   Endo/Heme/Allergies: Negative.   Psychiatric/Behavioral: Negative.    Physical Exam:  weight is 118 lb (53.5 kg). Her oral temperature is  98.1 F (36.7 C). Her blood pressure is 126/73 and her pulse is 92. Her respiration is 18 and oxygen saturation is 100%.   Wt Readings from Last 3 Encounters:  05/01/17 118 lb (53.5 kg)  03/19/17 117 lb 6.4 oz (53.3 kg)  02/06/17 118 lb 12.8 oz (53.9 kg)    Physical Exam  Constitutional: She is oriented to person, place, and time.  HENT:  Head: Normocephalic and atraumatic.  Mouth/Throat: Oropharynx is clear and moist.  Eyes: EOM are normal. Pupils are equal, round, and reactive to light.  Neck: Normal range of motion.  Cardiovascular: Normal rate, regular rhythm and normal heart sounds.  Pulmonary/Chest: Effort normal and breath sounds normal.  Abdominal: Soft. Bowel sounds are normal.  Musculoskeletal: Normal range of motion. She exhibits no edema, tenderness or deformity.  Lymphadenopathy:    She has no cervical adenopathy.  Neurological: She is alert and oriented to person, place, and time.  Skin: Skin is warm and dry. No rash noted. No erythema.  Psychiatric: She has a normal mood and affect. Her behavior is normal. Judgment and thought content normal.  Vitals reviewed.    Lab Results  Component Value Date   WBC 5.5 05/01/2017   HGB 13.9 03/19/2017   HCT 37.8 05/01/2017   MCV 104.4 (H) 05/01/2017   PLT 95 (L) 05/01/2017   No results found for: FERRITIN, IRON, TIBC, UIBC, IRONPCTSAT Lab Results  Component Value  Date   RETICCTPCT 2.9 (H) 10/30/2014   RBC 3.62 (L) 05/01/2017   RETICCTABS 98.3 10/30/2014   Lab Results  Component Value Date   KPAFRELGTCHN 14.00 (H) 02/15/2015   LAMBDASER 0.19 (L) 02/15/2015   KAPLAMBRATIO 18.20 (H) 03/19/2017   Lab Results  Component Value Date   IGGSERUM 656 (L) 03/19/2017   IGA 17 (L) 02/15/2015   IGMSERUM 546 (H) 03/19/2017   Lab Results  Component Value Date   TOTALPROTELP 6.5 02/15/2015   ALBUMINELP 4.2 02/15/2015   A1GS 0.3 02/15/2015   A2GS 0.5 02/15/2015   BETS 0.4 02/15/2015   BETA2SER 0.2 02/15/2015   GAMS 1.0  02/15/2015   MSPIKE 0.6 (H) 03/19/2017   SPEI * 02/15/2015     Chemistry      Component Value Date/Time   NA 143 05/01/2017 0816   NA 147 (H) 03/19/2017 0849   NA 139 03/16/2016 1337   K 3.5 05/01/2017 0816   K 4.5 03/19/2017 0849   K 3.8 03/16/2016 1337   CL 107 05/01/2017 0816   CL 108 03/19/2017 0849   CO2 30 05/01/2017 0816   CO2 31 03/19/2017 0849   CO2 27 03/16/2016 1337   BUN 10 05/01/2017 0816   BUN 11 03/19/2017 0849   BUN 5.2 (L) 03/16/2016 1337   CREATININE 0.80 05/01/2017 0816   CREATININE 1.0 03/19/2017 0849   CREATININE 0.7 03/16/2016 1337      Component Value Date/Time   CALCIUM 8.6 05/01/2017 0816   CALCIUM 9.6 03/19/2017 0849   CALCIUM 8.8 03/16/2016 1337   ALKPHOS 62 05/01/2017 0816   ALKPHOS 53 03/19/2017 0849   ALKPHOS 62 03/16/2016 1337   AST 27 05/01/2017 0816   AST 24 03/16/2016 1337   ALT 23 05/01/2017 0816   ALT 23 03/19/2017 0849   ALT 19 03/16/2016 1337   BILITOT 1.2 05/01/2017 0816   BILITOT 0.57 03/16/2016 1337      Impression and Plan: Kristy [name] is a very pleasant 62 yo caucasian female with Waldenstrom's macroglobulinemia as well as acquired hypogammaglobulinemia.   She is doing well with Imbruvica and has no complaints at this time.   For right now, we will go ahead with the IVIG.  We will have her come back in 6 weeks.   [name], MD 2/12/20199:46 AM

## 2017-05-02 ENCOUNTER — Telehealth: Payer: Self-pay | Admitting: *Deleted

## 2017-05-02 LAB — KAPPA/LAMBDA LIGHT CHAINS
KAPPA FREE LGHT CHN: 44.5 mg/L — AB (ref 3.3–19.4)
Kappa, lambda light chain ratio: 17.12 — ABNORMAL HIGH (ref 0.26–1.65)
Lambda free light chains: 2.6 mg/L — ABNORMAL LOW (ref 5.7–26.3)

## 2017-05-02 LAB — IGG, IGA, IGM
IGG (IMMUNOGLOBIN G), SERUM: 631 mg/dL — AB (ref 700–1600)
IgA: 15 mg/dL — ABNORMAL LOW (ref 87–352)
IgM (Immunoglobulin M), Srm: 505 mg/dL — ABNORMAL HIGH (ref 26–217)

## 2017-05-02 NOTE — Telephone Encounter (Signed)
-----   Message from [name], MD sent at 05/02/2017 11:10 AM EST ----- Call - IgM level now 500!!!  It is still coming down!!  Laurey Arrow

## 2017-05-03 ENCOUNTER — Encounter: Payer: Self-pay | Admitting: *Deleted

## 2017-05-07 LAB — PROTEIN ELECTROPHORESIS, SERUM, WITH REFLEX
A/G RATIO SPE: 1.6 (ref 0.7–1.7)
ALPHA-2-GLOBULIN: 0.5 g/dL (ref 0.4–1.0)
Albumin ELP: 3.9 g/dL (ref 2.9–4.4)
Alpha-1-Globulin: 0.2 g/dL (ref 0.0–0.4)
Beta Globulin: 0.9 g/dL (ref 0.7–1.3)
GLOBULIN, TOTAL: 2.5 g/dL (ref 2.2–3.9)
Gamma Globulin: 0.9 g/dL (ref 0.4–1.8)
M-SPIKE, %: 0.4 g/dL — AB
SPEP Interpretation: 0
Total Protein ELP: 6.4 g/dL (ref 6.0–8.5)

## 2017-05-15 ENCOUNTER — Other Ambulatory Visit: Payer: Self-pay | Admitting: Hematology & Oncology

## 2017-05-15 DIAGNOSIS — J0101 Acute recurrent maxillary sinusitis: Secondary | ICD-10-CM

## 2017-05-15 DIAGNOSIS — D801 Nonfamilial hypogammaglobulinemia: Secondary | ICD-10-CM

## 2017-05-15 DIAGNOSIS — C880 Waldenstrom macroglobulinemia: Secondary | ICD-10-CM

## 2017-05-21 ENCOUNTER — Telehealth: Payer: Self-pay | Admitting: Hematology & Oncology

## 2017-05-21 MED FILL — IMBRUVICA 420 MG TAB: 420 | 28 days supply | Qty: 28 | Fill #0

## 2017-05-21 MED FILL — CYANOCOBALAMIN 1,000 MCG/ML: 1000 | 60 days supply | Qty: 1 | Fill #0

## 2017-05-21 NOTE — Telephone Encounter (Signed)
Oral Oncology Patient Advocate Encounter  Prior Authorization for Kristy [name] has been approved.    PA# [id number] Effective dates: 04/21/2017 through 05/21/2018  Sent information to [facility].  Oral Oncology Clinic will continue to follow.    [place] Patient Advocate [phone] 05/21/2017 11:07 AM

## 2017-05-21 NOTE — Telephone Encounter (Signed)
.  Oral Oncology Patient Advocate Encounter  Received notification from [facility] that prior authorization for Imbruvica is required.  PA submitted on CoverMyMeds Key [id number] Status is pending  Oral Oncology Clinic will continue to follow.  [place] Patient Advocate [phone] 05/21/2017 10:05 AM

## 2017-06-12 ENCOUNTER — Inpatient Hospital Stay: Payer: Federal, State, Local not specified - PPO

## 2017-06-12 ENCOUNTER — Inpatient Hospital Stay: Payer: Federal, State, Local not specified - PPO | Attending: Hematology & Oncology | Admitting: Hematology & Oncology

## 2017-06-12 ENCOUNTER — Other Ambulatory Visit: Payer: Self-pay

## 2017-06-12 ENCOUNTER — Encounter: Payer: Self-pay | Admitting: Hematology & Oncology

## 2017-06-12 VITALS — BP 110/72 | HR 82 | Temp 98.1°F | Resp 20

## 2017-06-12 VITALS — BP 143/78 | HR 93 | Temp 97.7°F | Resp 18 | Wt 117.8 lb

## 2017-06-12 DIAGNOSIS — J329 Chronic sinusitis, unspecified: Secondary | ICD-10-CM | POA: Insufficient documentation

## 2017-06-12 DIAGNOSIS — J0101 Acute recurrent maxillary sinusitis: Secondary | ICD-10-CM

## 2017-06-12 DIAGNOSIS — C8800 Waldenstrom macroglobulinemia not having achieved remission: Secondary | ICD-10-CM

## 2017-06-12 DIAGNOSIS — D801 Nonfamilial hypogammaglobulinemia: Secondary | ICD-10-CM | POA: Diagnosis not present

## 2017-06-12 DIAGNOSIS — J328 Other chronic sinusitis: Secondary | ICD-10-CM

## 2017-06-12 LAB — CBC WITH DIFFERENTIAL (CANCER CENTER ONLY)
BASOS PCT: 0 %
Basophils Absolute: 0 10*3/uL (ref 0.0–0.1)
EOS ABS: 0.1 10*3/uL (ref 0.0–0.5)
EOS PCT: 1 %
HCT: 38.4 % (ref 34.8–46.6)
HEMOGLOBIN: 13 g/dL (ref 11.6–15.9)
LYMPHS ABS: 1.4 10*3/uL (ref 0.9–3.3)
Lymphocytes Relative: 25 %
MCH: 35.1 pg — AB (ref 26.0–34.0)
MCHC: 33.9 g/dL (ref 32.0–36.0)
MCV: 103.8 fL — ABNORMAL HIGH (ref 81.0–101.0)
MONO ABS: 0.3 10*3/uL (ref 0.1–0.9)
MONOS PCT: 6 %
Neutro Abs: 3.7 10*3/uL (ref 1.5–6.5)
Neutrophils Relative %: 68 %
PLATELETS: 96 10*3/uL — AB (ref 145–400)
RBC: 3.7 MIL/uL (ref 3.70–5.32)
RDW: 12.7 % (ref 11.1–15.7)
WBC Count: 5.5 10*3/uL (ref 3.9–10.0)

## 2017-06-12 LAB — CMP (CANCER CENTER ONLY)
ALBUMIN: 4.2 g/dL (ref 3.5–5.0)
ALT: 17 U/L (ref 10–47)
AST: 25 U/L (ref 11–38)
Alkaline Phosphatase: 55 U/L (ref 26–84)
Anion gap: 1 — ABNORMAL LOW (ref 5–15)
BUN: 11 mg/dL (ref 7–22)
CALCIUM: 9 mg/dL (ref 8.0–10.3)
CHLORIDE: 110 mmol/L — AB (ref 98–108)
CO2: 31 mmol/L (ref 18–33)
CREATININE: 0.9 mg/dL (ref 0.60–1.20)
GLUCOSE: 90 mg/dL (ref 73–118)
Potassium: 4.2 mmol/L (ref 3.3–4.7)
SODIUM: 142 mmol/L (ref 128–145)
Total Bilirubin: 1.2 mg/dL (ref 0.2–1.6)
Total Protein: 6.3 g/dL — ABNORMAL LOW (ref 6.4–8.1)

## 2017-06-12 MED ORDER — DIPHENHYDRAMINE HCL 25 MG PO CAPS
25.0000 mg | ORAL_CAPSULE | Freq: Once | ORAL | Status: AC
  Administered 2017-06-12: 25 mg via ORAL

## 2017-06-12 MED ORDER — IMMUNE GLOBULIN (HUMAN) 20 GM/200ML IV SOLN
40.0000 g | Freq: Once | INTRAVENOUS | Status: AC
  Administered 2017-06-12: 40 g via INTRAVENOUS
  Filled 2017-06-12: qty 400

## 2017-06-12 MED ORDER — DIPHENHYDRAMINE HCL 25 MG PO CAPS
ORAL_CAPSULE | ORAL | Status: AC
  Filled 2017-06-12: qty 1

## 2017-06-12 MED ORDER — DEXTROSE 5 % IV SOLN
INTRAVENOUS | Status: DC
  Administered 2017-06-12: 11:00:00 via INTRAVENOUS

## 2017-06-12 MED ORDER — ACETAMINOPHEN 325 MG PO TABS
ORAL_TABLET | ORAL | Status: AC
  Filled 2017-06-12: qty 2

## 2017-06-12 MED ORDER — ACETAMINOPHEN 325 MG PO TABS
650.0000 mg | ORAL_TABLET | Freq: Once | ORAL | Status: AC
  Administered 2017-06-12: 650 mg via ORAL

## 2017-06-12 NOTE — Patient Instructions (Signed)

## 2017-06-12 NOTE — Progress Notes (Signed)
Hematology and Oncology Follow Up Visit  Kristy [name] [id number] 06-18-1955 62 y.o. 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  She is doing quite well.  She still working.  She is having no problems with fatigue or weakness.  She is having a lot of fun helping take care of her granddaughter.  She does is after work.  She is helping out her son and daughter-in-law, both of who are going to school.  Her sinuses have been doing better.  Her IVIG has helped.  Her Waldenstrm's levels are also stable.  Her IgM level was 505 mg/dL.  Her M spike was 0.4 g/dL.  She has had no nausea or vomiting.  She is had no change in bowel or bladder habits.  She has had no rashes.  She is had no headache.  Overall, her performance status is ECOG 0.  Medications:  Allergies as of 06/12/2017   No Known Allergies     Medication List        Accurate as of 06/12/17 10:26 AM. Always use your most recent med list.          cholecalciferol 1000 units tablet Commonly known as:  VITAMIN D Take 2,000 Units by mouth daily.   cyanocobalamin 1000 MCG/ML injection Commonly known as:  (VITAMIN B-12) INJECT 1 ML INTO THE MUSCLE EVERY 2 MONTHS   Ibrutinib 420 MG Tabs Commonly known as:  IMBRUVICA Take 420 mg by mouth daily after breakfast.       Allergies: No Known Allergies  Past Medical History, Surgical history, Social history, and Family History were reviewed and updated.  Review of Systems: Review of Systems  Constitutional: Negative.   HENT: Positive for congestion.   Eyes: Negative.   Respiratory: Negative.   Cardiovascular: Negative.   Gastrointestinal: Negative.   Genitourinary: Negative.   Musculoskeletal: Negative.   Skin: Negative.     Neurological: Negative.   Endo/Heme/Allergies: Negative.   Psychiatric/Behavioral: Negative.    Physical Exam:  weight is 117 lb 12 oz (53.4 kg). Her oral temperature is 97.7 F (36.5 C). Her blood pressure is 143/78 (abnormal) and her pulse is 93. Her respiration is 18 and oxygen saturation is 98%.   Wt Readings from Last 3 Encounters:  06/12/17 117 lb 12 oz (53.4 kg)  05/01/17 118 lb (53.5 kg)  03/19/17 117 lb 6.4 oz (53.3 kg)    Physical Exam  Constitutional: She is oriented to person, place, and time.  HENT:  Head: Normocephalic and atraumatic.  Mouth/Throat: Oropharynx is clear and moist.  Eyes: Pupils are equal, round, and reactive to light. EOM are normal.  Neck: Normal range of motion.  Cardiovascular: Normal rate, regular rhythm and normal heart sounds.  Pulmonary/Chest: Effort normal and breath sounds normal.  Abdominal: Soft. Bowel sounds are normal.  Musculoskeletal: Normal range of motion. She exhibits no edema, tenderness or deformity.  Lymphadenopathy:    She has no cervical adenopathy.  Neurological: She is alert and oriented to person, place, and time.  Skin: Skin is warm and dry. No rash noted. No erythema.  Psychiatric: She has a normal mood and affect. Her behavior is normal. Judgment and thought content normal.  Vitals reviewed.    Lab Results  Component Value Date   WBC 5.5 06/12/2017  HGB 13.9 03/19/2017   HCT 38.4 06/12/2017   MCV 103.8 (H) 06/12/2017   PLT 96 (L) 06/12/2017   No results found for: FERRITIN, IRON, TIBC, UIBC, IRONPCTSAT Lab Results  Component Value Date   RETICCTPCT 2.9 (H) 10/30/2014   RBC 3.70 06/12/2017   RETICCTABS 98.3 10/30/2014   Lab Results  Component Value Date   KPAFRELGTCHN 44.5 (H) 05/01/2017   LAMBDASER 2.6 (L) 05/01/2017   KAPLAMBRATIO 17.12 (H) 05/01/2017   Lab Results  Component Value Date   IGGSERUM 631 (L) 05/01/2017   IGA 15 (L) 05/01/2017   IGMSERUM 505 (H) 05/01/2017   Lab Results  Component  Value Date   TOTALPROTELP 6.4 05/01/2017   ALBUMINELP 3.9 05/01/2017   A1GS 0.2 05/01/2017   A2GS 0.5 05/01/2017   BETS 0.9 05/01/2017   BETA2SER 0.2 02/15/2015   GAMS 0.9 05/01/2017   MSPIKE 0.4 (H) 05/01/2017   SPEI * 02/15/2015     Chemistry      Component Value Date/Time   NA 142 06/12/2017 0901   NA 147 (H) 03/19/2017 0849   NA 139 03/16/2016 1337   K 4.2 06/12/2017 0901   K 4.5 03/19/2017 0849   K 3.8 03/16/2016 1337   CL 110 (H) 06/12/2017 0901   CL 108 03/19/2017 0849   CO2 31 06/12/2017 0901   CO2 31 03/19/2017 0849   CO2 27 03/16/2016 1337   BUN 11 06/12/2017 0901   BUN 11 03/19/2017 0849   BUN 5.2 (L) 03/16/2016 1337   CREATININE 0.90 06/12/2017 0901   CREATININE 1.0 03/19/2017 0849   CREATININE 0.7 03/16/2016 1337      Component Value Date/Time   CALCIUM 9.0 06/12/2017 0901   CALCIUM 9.6 03/19/2017 0849   CALCIUM 8.8 03/16/2016 1337   ALKPHOS 55 06/12/2017 0901   ALKPHOS 53 03/19/2017 0849   ALKPHOS 62 03/16/2016 1337   AST 25 06/12/2017 0901   AST 24 03/16/2016 1337   ALT 17 06/12/2017 0901   ALT 23 03/19/2017 0849   ALT 19 03/16/2016 1337   BILITOT 1.2 06/12/2017 0901   BILITOT 0.57 03/16/2016 1337      Impression and Plan: Kristy [name] is a very pleasant 62 yo caucasian female with Waldenstrom's macroglobulinemia as well as acquired hypogammaglobulinemia.   She is doing well with Imbruvica and has no complaints at this time.   For right now, we will go ahead with the IVIG.  We will have her come back in 6 weeks.   [name], MD 3/26/201910:26 AM

## 2017-06-13 ENCOUNTER — Telehealth: Payer: Self-pay | Admitting: *Deleted

## 2017-06-13 ENCOUNTER — Encounter: Payer: Self-pay | Admitting: *Deleted

## 2017-06-13 LAB — KAPPA/LAMBDA LIGHT CHAINS
KAPPA, LAMDA LIGHT CHAIN RATIO: 22.05 — AB (ref 0.26–1.65)
Kappa free light chain: 48.5 mg/L — ABNORMAL HIGH (ref 3.3–19.4)
Lambda free light chains: 2.2 mg/L — ABNORMAL LOW (ref 5.7–26.3)

## 2017-06-13 LAB — IGG, IGA, IGM
IGA: 14 mg/dL — AB (ref 87–352)
IGG (IMMUNOGLOBIN G), SERUM: 618 mg/dL — AB (ref 700–1600)
IgM (Immunoglobulin M), Srm: 491 mg/dL — ABNORMAL HIGH (ref 26–217)

## 2017-06-13 NOTE — Telephone Encounter (Signed)
-----   Message from Kristy Napoleon, MD sent at 06/13/2017  6:46 AM EDT ----- Call - the IgM level is still coming down!!!  Kristy [name]

## 2017-06-15 LAB — PROTEIN ELECTROPHORESIS, SERUM, WITH REFLEX
A/G RATIO SPE: 1.5 (ref 0.7–1.7)
Albumin ELP: 3.8 g/dL (ref 2.9–4.4)
Alpha-1-Globulin: 0.2 g/dL (ref 0.0–0.4)
Alpha-2-Globulin: 0.5 g/dL (ref 0.4–1.0)
Beta Globulin: 0.9 g/dL (ref 0.7–1.3)
GAMMA GLOBULIN: 1 g/dL (ref 0.4–1.8)
Globulin, Total: 2.6 g/dL (ref 2.2–3.9)
M-SPIKE, %: 0.6 g/dL — AB
SPEP Interpretation: 0
TOTAL PROTEIN ELP: 6.4 g/dL (ref 6.0–8.5)

## 2017-06-25 MED FILL — IMBRUVICA 420 MG TAB: 420 | 28 days supply | Qty: 28 | Fill #1

## 2017-06-27 ENCOUNTER — Telehealth: Payer: Self-pay | Admitting: *Deleted

## 2017-06-27 ENCOUNTER — Ambulatory Visit (HOSPITAL_BASED_OUTPATIENT_CLINIC_OR_DEPARTMENT_OTHER)
Admission: RE | Admit: 2017-06-27 | Discharge: 2017-06-27 | Disposition: A | Discharge status: Home / Self Care | Payer: Federal, State, Local not specified - PPO | Source: Ambulatory Visit | Attending: Family | Admitting: Family

## 2017-06-27 ENCOUNTER — Other Ambulatory Visit: Payer: Self-pay | Admitting: Family

## 2017-06-27 DIAGNOSIS — C8800 Waldenstrom macroglobulinemia not having achieved remission: Secondary | ICD-10-CM

## 2017-06-27 DIAGNOSIS — M7989 Other specified soft tissue disorders: Secondary | ICD-10-CM

## 2017-06-27 NOTE — Telephone Encounter (Signed)
Call received from pt stating that she is having pain and swelling to her right ankle and calf along with redness to the calf.  Jory Ee NP notified and order received for pt to have ultrasound of right leg at [facility].  Phone number to radiology given to pt to make appt for ultrasound.  Patient appreciative of assistance and has no further questions or concerns at this time.

## 2017-06-28 ENCOUNTER — Telehealth: Payer: Self-pay | Admitting: *Deleted

## 2017-06-28 NOTE — Telephone Encounter (Addendum)
Patient is aware of results  ----- Message from Eliezer Bottom, NP sent at 06/28/2017  9:32 AM EDT ----- Regarding: [place] No DVT!   ----- Message ----- From: Interface, Rad Results In Sent: 06/28/2017   8:28 AM To: Eliezer Bottom, NP

## 2017-06-29 ENCOUNTER — Ambulatory Visit: Payer: Federal, State, Local not specified - PPO | Admitting: Podiatry

## 2017-06-29 ENCOUNTER — Ambulatory Visit (INDEPENDENT_AMBULATORY_CARE_PROVIDER_SITE_OTHER): Payer: Federal, State, Local not specified - PPO

## 2017-06-29 DIAGNOSIS — M779 Enthesopathy, unspecified: Secondary | ICD-10-CM

## 2017-06-29 DIAGNOSIS — M79604 Pain in right leg: Secondary | ICD-10-CM | POA: Diagnosis not present

## 2017-06-29 DIAGNOSIS — S93401A Sprain of unspecified ligament of right ankle, initial encounter: Secondary | ICD-10-CM

## 2017-07-02 NOTE — Progress Notes (Signed)
  Subjective: Arnika presents the office today for concerns of swelling and pain to the right leg and ankle.  She did recently get a venous duplex to rule out a DVT which was negative.  4 weeks ago she did sprain her ankle she thought she was getting better but more recently she started to have some pain she points along the lateral aspect of her leg as well as some swelling to the ankle.  She has had a history of plantar fasciitis and she is been doing some pain in the back of her heel as well but not as significant. Denies any systemic complaints such as fevers, chills, nausea, vomiting. No acute changes since last appointment, and no other complaints at this time.   Objective: AAO x3, NAD DP/PT pulses palpable bilaterally, CRT less than 3 seconds There is minimal discomfort in the posterior aspect of the calcaneus on the insertion of the Achilles tendon.  Thompson test is negative and Achilles tendon appears to be intact.  No pain to lateral ankle ligaments, deltoid ligaments are syndesmosis.  No pain to the foot at all.  Mild swelling to the ankle there is no erythema or increase in warmth.  There is tenderness more the lateral aspect of the leg more along the mid substance of the fibula also in the peroneal tendon.  No pain to vibratory sensation on this area. No open lesions or pre-ulcerative lesions.  No pain with calf compression, swelling, warmth, erythema  Assessment: Likely tendinitis right side  Plan: -All treatment options discussed with the patient including all alternatives, risks, complications.  -I did an x-ray in the office today which not reveal any evidence of acute fracture or stress fracture.  However given the location I did order a tib-fib x-ray to be done in the [facility]. -She cannot take anti-inflammatories she reports.  I gave her some Biofreeze to apply to the area.  Also we did an Haematologist for compression.  Discussed elevation and ice. -Patient  encouraged to call the office with any questions, concerns, change in symptoms.   Trula Slade DPM

## 2017-07-04 ENCOUNTER — Telehealth: Payer: Self-pay | Admitting: Pharmacist

## 2017-07-04 NOTE — Telephone Encounter (Signed)
Oral Chemotherapy Pharmacist Encounter  Follow-Up Form  Called patient today to follow up regarding patient's oral chemotherapy medication: Imbruvica (ibrutinib)  Original Start date of oral chemotherapy: 01/2014  Pt reports 0 tablets/doses of Imbruvica (ibrutinib) missed in the last month.   Pt reports the following side effects: none, reported  Recent labs reviewed: IgM from 06/12/16 stable  New medications?: none, reported  Other Issues: she reported some medication unilateral ankle swelling which had been evaluated by several non-oncology provided. She stated that the swelling has gone down recently.  Patient knows to call the office with questions or concerns. Oral Oncology Clinic will continue to follow.  Darl Pikes, PharmD, BCPS Hematology/Oncology Clinical Pharmacist [facility] [place] [facility] [phone]  07/04/2017 3:03 PM

## 2017-07-11 ENCOUNTER — Other Ambulatory Visit: Payer: Self-pay

## 2017-07-11 DIAGNOSIS — S93401A Sprain of unspecified ligament of right ankle, initial encounter: Secondary | ICD-10-CM

## 2017-07-11 DIAGNOSIS — M79604 Pain in right leg: Secondary | ICD-10-CM

## 2017-07-20 ENCOUNTER — Ambulatory Visit: Payer: Federal, State, Local not specified - PPO | Admitting: Podiatry

## 2017-07-20 MED FILL — IMBRUVICA 420 MG TAB: 420 | 28 days supply | Qty: 28 | Fill #2

## 2017-07-20 MED FILL — CYANOCOBALAMIN 1,000 MCG/ML: 1000 | 60 days supply | Qty: 1 | Fill #1

## 2017-07-23 ENCOUNTER — Ambulatory Visit: Payer: Federal, State, Local not specified - PPO | Admitting: Podiatry

## 2017-07-24 ENCOUNTER — Inpatient Hospital Stay: Payer: Federal, State, Local not specified - PPO

## 2017-07-24 ENCOUNTER — Inpatient Hospital Stay: Payer: Federal, State, Local not specified - PPO | Attending: Hematology & Oncology | Admitting: Hematology & Oncology

## 2017-07-24 VITALS — BP 123/72 | HR 82 | Temp 98.1°F | Resp 18 | Wt 117.0 lb

## 2017-07-24 VITALS — BP 104/51 | HR 86 | Temp 97.6°F | Resp 18

## 2017-07-24 DIAGNOSIS — D801 Nonfamilial hypogammaglobulinemia: Secondary | ICD-10-CM

## 2017-07-24 DIAGNOSIS — J329 Chronic sinusitis, unspecified: Secondary | ICD-10-CM | POA: Diagnosis not present

## 2017-07-24 DIAGNOSIS — C8800 Waldenstrom macroglobulinemia not having achieved remission: Secondary | ICD-10-CM

## 2017-07-24 DIAGNOSIS — Z79899 Other long term (current) drug therapy: Secondary | ICD-10-CM | POA: Diagnosis not present

## 2017-07-24 DIAGNOSIS — J0101 Acute recurrent maxillary sinusitis: Secondary | ICD-10-CM

## 2017-07-24 LAB — CBC WITH DIFFERENTIAL (CANCER CENTER ONLY)
Basophils Absolute: 0 10*3/uL (ref 0.0–0.1)
Basophils Relative: 0 %
EOS ABS: 0.1 10*3/uL (ref 0.0–0.5)
Eosinophils Relative: 2 %
HEMATOCRIT: 38.3 % (ref 34.8–46.6)
Hemoglobin: 13 g/dL (ref 11.6–15.9)
LYMPHS ABS: 2 10*3/uL (ref 0.9–3.3)
Lymphocytes Relative: 31 %
MCH: 35.1 pg — AB (ref 26.0–34.0)
MCHC: 33.9 g/dL (ref 32.0–36.0)
MCV: 103.5 fL — AB (ref 81.0–101.0)
MONOS PCT: 8 %
Monocytes Absolute: 0.5 10*3/uL (ref 0.1–0.9)
NEUTROS PCT: 59 %
Neutro Abs: 3.8 10*3/uL (ref 1.5–6.5)
Platelet Count: 108 10*3/uL — ABNORMAL LOW (ref 145–400)
RBC: 3.7 MIL/uL (ref 3.70–5.32)
RDW: 12.4 % (ref 11.1–15.7)
WBC Count: 6.4 10*3/uL (ref 3.9–10.0)

## 2017-07-24 LAB — CMP (CANCER CENTER ONLY)
ALK PHOS: 62 U/L (ref 26–84)
ALT: 26 U/L (ref 10–47)
AST: 28 U/L (ref 11–38)
Albumin: 4.1 g/dL (ref 3.5–5.0)
Anion gap: 3 — ABNORMAL LOW (ref 5–15)
BILIRUBIN TOTAL: 1.6 mg/dL (ref 0.2–1.6)
BUN: 9 mg/dL (ref 7–22)
CALCIUM: 9.1 mg/dL (ref 8.0–10.3)
CHLORIDE: 108 mmol/L (ref 98–108)
CO2: 29 mmol/L (ref 18–33)
CREATININE: 1 mg/dL (ref 0.60–1.20)
Glucose, Bld: 96 mg/dL (ref 73–118)
Potassium: 3.8 mmol/L (ref 3.3–4.7)
SODIUM: 140 mmol/L (ref 128–145)
Total Protein: 6.3 g/dL — ABNORMAL LOW (ref 6.4–8.1)

## 2017-07-24 MED ORDER — ACETAMINOPHEN 325 MG PO TABS
650.0000 mg | ORAL_TABLET | Freq: Once | ORAL | Status: AC
  Administered 2017-07-24: 650 mg via ORAL

## 2017-07-24 MED ORDER — DIPHENHYDRAMINE HCL 25 MG PO CAPS
25.0000 mg | ORAL_CAPSULE | Freq: Once | ORAL | Status: AC
  Administered 2017-07-24: 25 mg via ORAL

## 2017-07-24 MED ORDER — DIPHENHYDRAMINE HCL 25 MG PO CAPS
ORAL_CAPSULE | ORAL | Status: AC
  Filled 2017-07-24: qty 1

## 2017-07-24 MED ORDER — ACETAMINOPHEN 325 MG PO TABS
ORAL_TABLET | ORAL | Status: AC
  Filled 2017-07-24: qty 2

## 2017-07-24 MED ORDER — IMMUNE GLOBULIN (HUMAN) 20 GM/200ML IV SOLN
40.0000 g | Freq: Once | INTRAVENOUS | Status: AC
  Administered 2017-07-24: 40 g via INTRAVENOUS
  Filled 2017-07-24: qty 400

## 2017-07-24 MED ORDER — DEXTROSE 5 % IV SOLN
INTRAVENOUS | Status: DC
  Administered 2017-07-24: 10:00:00 via INTRAVENOUS

## 2017-07-24 NOTE — Progress Notes (Signed)
Hematology and Oncology Follow Up Visit  Kristy [name] [id number] 1955-12-12 62 y.o. 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  She is doing quite well.  She still working.  She is having no problems with fatigue or weakness.  She does have some occasional lower back discomfort.  She feels this might be from carrying her granddaughter.  Her last M spike with her Waldenstrm's was 0.6 g/dL.  Her IgM level was 491 mg/dL.  Her sinuses have been doing pretty well.  We will try to move her IVIG out to every 2 months now.  Overall, her performance status is ECOG 0.  Medications:  Allergies as of 07/24/2017   No Known Allergies     Medication List        Accurate as of 07/24/17  9:52 AM. Always use your most recent med list.          cholecalciferol 1000 units tablet Commonly known as:  VITAMIN D Take 2,000 Units by mouth daily.   cyanocobalamin 1000 MCG/ML injection Commonly known as:  (VITAMIN B-12) INJECT 1 ML INTO THE MUSCLE EVERY 2 MONTHS   Ibrutinib 420 MG Tabs Commonly known as:  IMBRUVICA Take 420 mg by mouth daily after breakfast.       Allergies: No Known Allergies  Past Medical History, Surgical history, Social history, and Family History were reviewed and updated.  Review of Systems: Review of Systems  Constitutional: Negative.   HENT: Positive for congestion.   Eyes: Negative.   Respiratory: Negative.   Cardiovascular: Negative.   Gastrointestinal: Negative.   Genitourinary: Negative.   Musculoskeletal: Negative.   Skin: Negative.   Neurological: Negative.   Endo/Heme/Allergies: Negative.   Psychiatric/Behavioral: Negative.    Physical Exam:  weight is 117 lb (53.1 kg). Her oral temperature is 98.1 F (36.7  C). Her blood pressure is 123/72 and her pulse is 82. Her respiration is 18 and oxygen saturation is 98%.   Wt Readings from Last 3 Encounters:  07/24/17 117 lb (53.1 kg)  06/12/17 117 lb 12 oz (53.4 kg)  05/01/17 118 lb (53.5 kg)    Physical Exam  Constitutional: She is oriented to person, place, and time.  HENT:  Head: Normocephalic and atraumatic.  Mouth/Throat: Oropharynx is clear and moist.  Eyes: Pupils are equal, round, and reactive to light. EOM are normal.  Neck: Normal range of motion.  Cardiovascular: Normal rate, regular rhythm and normal heart sounds.  Pulmonary/Chest: Effort normal and breath sounds normal.  Abdominal: Soft. Bowel sounds are normal.  Musculoskeletal: Normal range of motion. She exhibits no edema, tenderness or deformity.  Lymphadenopathy:    She has no cervical adenopathy.  Neurological: She is alert and oriented to person, place, and time.  Skin: Skin is warm and dry. No rash noted. No erythema.  Psychiatric: She has a normal mood and affect. Her behavior is normal. Judgment and thought content normal.  Vitals reviewed.    Lab Results  Component Value Date   WBC 6.4 07/24/2017   HGB 13.0 07/24/2017   HCT 38.3 07/24/2017   MCV 103.5 (H) 07/24/2017   PLT 108 (L) 07/24/2017   No results found for: FERRITIN, IRON, TIBC, UIBC, IRONPCTSAT Lab Results  Component Value Date   RETICCTPCT 2.9 (H) 10/30/2014  RBC 3.70 07/24/2017   RETICCTABS 98.3 10/30/2014   Lab Results  Component Value Date   KPAFRELGTCHN 48.5 (H) 06/12/2017   LAMBDASER 2.2 (L) 06/12/2017   KAPLAMBRATIO 22.05 (H) 06/12/2017   Lab Results  Component Value Date   IGGSERUM 618 (L) 06/12/2017   IGA 14 (L) 06/12/2017   IGMSERUM 491 (H) 06/12/2017   Lab Results  Component Value Date   TOTALPROTELP 6.4 06/12/2017   ALBUMINELP 3.8 06/12/2017   A1GS 0.2 06/12/2017   A2GS 0.5 06/12/2017   BETS 0.9 06/12/2017   BETA2SER 0.2 02/15/2015   GAMS 1.0 06/12/2017   MSPIKE 0.6 (H)  06/12/2017   SPEI * 02/15/2015     Chemistry      Component Value Date/Time   NA 140 07/24/2017 0901   NA 147 (H) 03/19/2017 0849   NA 139 03/16/2016 1337   K 3.8 07/24/2017 0901   K 4.5 03/19/2017 0849   K 3.8 03/16/2016 1337   CL 108 07/24/2017 0901   CL 108 03/19/2017 0849   CO2 29 07/24/2017 0901   CO2 31 03/19/2017 0849   CO2 27 03/16/2016 1337   BUN 9 07/24/2017 0901   BUN 11 03/19/2017 0849   BUN 5.2 (L) 03/16/2016 1337   CREATININE 1.00 07/24/2017 0901   CREATININE 1.0 03/19/2017 0849   CREATININE 0.7 03/16/2016 1337      Component Value Date/Time   CALCIUM 9.1 07/24/2017 0901   CALCIUM 9.6 03/19/2017 0849   CALCIUM 8.8 03/16/2016 1337   ALKPHOS 62 07/24/2017 0901   ALKPHOS 53 03/19/2017 0849   ALKPHOS 62 03/16/2016 1337   AST 28 07/24/2017 0901   AST 24 03/16/2016 1337   ALT 26 07/24/2017 0901   ALT 23 03/19/2017 0849   ALT 19 03/16/2016 1337   BILITOT 1.6 07/24/2017 0901   BILITOT 0.57 03/16/2016 1337      Impression and Plan: Kristy [name] is a very pleasant 62 yo caucasian female with Waldenstrom's macroglobulinemia as well as acquired hypogammaglobulinemia.   She is doing well with Imbruvica and has no complaints at this time.   For right now, we will go ahead with the IVIG.  We will have her come back in 8 weeks.   Kristy Napoleon, MD 5/7/20199:52 AM

## 2017-07-24 NOTE — Progress Notes (Signed)
OK to run IVIG at prior rate of 50 mg/kg per Dr. Marin Olp.

## 2017-07-25 ENCOUNTER — Encounter: Payer: Self-pay | Admitting: *Deleted

## 2017-07-25 LAB — IGG, IGA, IGM
IGA: 14 mg/dL — AB (ref 87–352)
IgG (Immunoglobin G), Serum: 641 mg/dL — ABNORMAL LOW (ref 700–1600)
IgM (Immunoglobulin M), Srm: 452 mg/dL — ABNORMAL HIGH (ref 26–217)

## 2017-07-25 LAB — KAPPA/LAMBDA LIGHT CHAINS
Kappa free light chain: 41.4 mg/L — ABNORMAL HIGH (ref 3.3–19.4)
Kappa, lambda light chain ratio: 16.56 — ABNORMAL HIGH (ref 0.26–1.65)
LAMDA FREE LIGHT CHAINS: 2.5 mg/L — AB (ref 5.7–26.3)

## 2017-07-27 LAB — PROTEIN ELECTROPHORESIS, SERUM, WITH REFLEX
A/G Ratio: 1.6 (ref 0.7–1.7)
ALPHA-1-GLOBULIN: 0.2 g/dL (ref 0.0–0.4)
ALPHA-2-GLOBULIN: 0.5 g/dL (ref 0.4–1.0)
Albumin ELP: 3.9 g/dL (ref 2.9–4.4)
BETA GLOBULIN: 0.9 g/dL (ref 0.7–1.3)
Gamma Globulin: 0.9 g/dL (ref 0.4–1.8)
Globulin, Total: 2.4 g/dL (ref 2.2–3.9)
M-Spike, %: 0.4 g/dL — ABNORMAL HIGH
SPEP INTERP: 0
Total Protein ELP: 6.3 g/dL (ref 6.0–8.5)

## 2017-07-27 LAB — IMMUNOFIXATION REFLEX, SERUM
IGA: 13 mg/dL — AB (ref 87–352)
IGM (IMMUNOGLOBULIN M), SRM: 471 mg/dL — AB (ref 26–217)
IgG (Immunoglobin G), Serum: 663 mg/dL — ABNORMAL LOW (ref 700–1600)

## 2017-08-20 MED FILL — IMBRUVICA 420 MG TAB: 420 | 28 days supply | Qty: 28 | Fill #3

## 2017-09-18 MED FILL — IMBRUVICA 420 MG TAB: 420 | 28 days supply | Qty: 28 | Fill #4

## 2017-09-18 MED FILL — CYANOCOBALAMIN 1,000 MCG/ML: 1000 | 60 days supply | Qty: 1 | Fill #2

## 2017-09-25 ENCOUNTER — Inpatient Hospital Stay: Payer: Federal, State, Local not specified - PPO | Attending: Hematology & Oncology | Admitting: Family

## 2017-09-25 ENCOUNTER — Inpatient Hospital Stay: Payer: Federal, State, Local not specified - PPO

## 2017-09-25 ENCOUNTER — Encounter: Payer: Self-pay | Admitting: Family

## 2017-09-25 ENCOUNTER — Other Ambulatory Visit: Payer: Self-pay

## 2017-09-25 VITALS — BP 113/55 | HR 81 | Resp 20

## 2017-09-25 VITALS — BP 127/76 | HR 91 | Temp 97.8°F | Resp 18 | Wt 116.2 lb

## 2017-09-25 DIAGNOSIS — D801 Nonfamilial hypogammaglobulinemia: Secondary | ICD-10-CM | POA: Insufficient documentation

## 2017-09-25 DIAGNOSIS — Z79899 Other long term (current) drug therapy: Secondary | ICD-10-CM | POA: Diagnosis not present

## 2017-09-25 DIAGNOSIS — C880 Waldenstrom macroglobulinemia: Secondary | ICD-10-CM | POA: Insufficient documentation

## 2017-09-25 DIAGNOSIS — J0101 Acute recurrent maxillary sinusitis: Secondary | ICD-10-CM

## 2017-09-25 LAB — CBC WITH DIFFERENTIAL (CANCER CENTER ONLY)
BASOS ABS: 0 10*3/uL (ref 0.0–0.1)
Basophils Relative: 0 %
EOS ABS: 0 10*3/uL (ref 0.0–0.5)
EOS PCT: 1 %
HCT: 38.6 % (ref 34.8–46.6)
Hemoglobin: 13.1 g/dL (ref 11.6–15.9)
LYMPHS PCT: 25 %
Lymphs Abs: 1.4 10*3/uL (ref 0.9–3.3)
MCH: 35.1 pg — ABNORMAL HIGH (ref 26.0–34.0)
MCHC: 33.9 g/dL (ref 32.0–36.0)
MCV: 103.5 fL — AB (ref 81.0–101.0)
MONO ABS: 0.5 10*3/uL (ref 0.1–0.9)
Monocytes Relative: 9 %
Neutro Abs: 3.7 10*3/uL (ref 1.5–6.5)
Neutrophils Relative %: 65 %
PLATELETS: 97 10*3/uL — AB (ref 145–400)
RBC: 3.73 MIL/uL (ref 3.70–5.32)
RDW: 12.7 % (ref 11.1–15.7)
WBC: 5.7 10*3/uL (ref 3.9–10.0)

## 2017-09-25 LAB — CMP (CANCER CENTER ONLY)
ALBUMIN: 3.9 g/dL (ref 3.5–5.0)
ALK PHOS: 54 U/L (ref 26–84)
ALT: 20 U/L (ref 10–47)
AST: 26 U/L (ref 11–38)
Anion gap: 10 (ref 5–15)
BUN: 8 mg/dL (ref 7–22)
CALCIUM: 8.5 mg/dL (ref 8.0–10.3)
CO2: 28 mmol/L (ref 18–33)
CREATININE: 0.8 mg/dL (ref 0.60–1.20)
Chloride: 102 mmol/L (ref 98–108)
Glucose, Bld: 93 mg/dL (ref 73–118)
Potassium: 4.2 mmol/L (ref 3.3–4.7)
SODIUM: 140 mmol/L (ref 128–145)
TOTAL PROTEIN: 6.1 g/dL — AB (ref 6.4–8.1)
Total Bilirubin: 1.5 mg/dL (ref 0.2–1.6)

## 2017-09-25 MED ORDER — ACETAMINOPHEN 325 MG PO TABS
ORAL_TABLET | ORAL | Status: AC
  Filled 2017-09-25: qty 2

## 2017-09-25 MED ORDER — ACETAMINOPHEN 325 MG PO TABS
650.0000 mg | ORAL_TABLET | Freq: Once | ORAL | Status: AC
  Administered 2017-09-25: 650 mg via ORAL

## 2017-09-25 MED ORDER — DIPHENHYDRAMINE HCL 25 MG PO CAPS
25.0000 mg | ORAL_CAPSULE | Freq: Once | ORAL | Status: AC
  Administered 2017-09-25: 25 mg via ORAL

## 2017-09-25 MED ORDER — IMMUNE GLOBULIN (HUMAN) 20 GM/200ML IV SOLN
40.0000 g | Freq: Once | INTRAVENOUS | Status: AC
  Administered 2017-09-25: 40 g via INTRAVENOUS
  Filled 2017-09-25: qty 400

## 2017-09-25 MED ORDER — DIPHENHYDRAMINE HCL 25 MG PO CAPS
ORAL_CAPSULE | ORAL | Status: AC
  Filled 2017-09-25: qty 1

## 2017-09-25 NOTE — Progress Notes (Signed)
Hematology and Oncology Follow Up Visit  Kristy [name] [id number] 1955/09/07 62 y.o. 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 is here today for follow-up. She is symptomatic with sinus congestion and drainage. She states that the heat and humidity always exacerbate her symptoms.  M-spike in May was 0.4 g/dL and IgM is 452 mg/dL.  No lymphadenopathy noted on exam.  She has had no fever, chills, n/v, cough, rash, dizziness, SOB, chest pain, palpitations, abdominal pain or changes in bowel or bladder habits.  No episodes of bleeding, no bruising or petechiae. No c/o pain.  No swelling, tenderness, numbness or tingling in her extremities. No c/o pain.  She has maintained a good appetite and is staying well hydrated. Her weight is stable.   ECOG Performance Status: 1 - Symptomatic but completely ambulatory  Medications:  Allergies as of 09/25/2017   No Known Allergies     Medication List        Accurate as of 09/25/17  9:55 AM. Always use your most recent med list.          cholecalciferol 1000 units tablet Commonly known as:  VITAMIN D Take 2,000 Units by mouth daily.   cyanocobalamin 1000 MCG/ML injection Commonly known as:  (VITAMIN B-12) INJECT 1 ML INTO THE MUSCLE EVERY 2 MONTHS   Ibrutinib 420 MG Tabs Commonly known as:  IMBRUVICA Take 420 mg by mouth daily after breakfast.       Allergies: No Known Allergies  Past Medical History, Surgical history, Social history, and Family History were reviewed and updated.  Review of Systems: All other 10 point review of systems is negative.   Physical Exam:  weight is 116 lb 4 oz (52.7 kg). Her oral temperature is 97.8 F (36.6 C). Her blood pressure is 127/76 and her pulse is 91. Her respiration is 18 and oxygen saturation is  99%.   Wt Readings from Last 3 Encounters:  09/25/17 116 lb 4 oz (52.7 kg)  07/24/17 117 lb (53.1 kg)  06/12/17 117 lb 12 oz (53.4 kg)    Ocular: Sclerae unicteric, pupils equal, round and reactive to light Ear-nose-throat: Oropharynx clear, dentition fair Lymphatic: No cervical, supraclavicular or axillary adenopathy Lungs no rales or rhonchi, good excursion bilaterally Heart regular rate and rhythm, no murmur appreciated Abd soft, nontender, positive bowel sounds, no liver or spleen tip palpated on exam, no fluid wave  MSK no focal spinal tenderness, no joint edema Neuro: non-focal, well-oriented, appropriate affect Breasts: Deferred   Lab Results  Component Value Date   WBC 5.7 09/25/2017   HGB 13.1 09/25/2017   HCT 38.6 09/25/2017   MCV 103.5 (H) 09/25/2017   PLT 97 (L) 09/25/2017   No results found for: FERRITIN, IRON, TIBC, UIBC, IRONPCTSAT Lab Results  Component Value Date   RETICCTPCT 2.9 (H) 10/30/2014   RBC 3.73 09/25/2017   RETICCTABS 98.3 10/30/2014   Lab Results  Component Value Date   KPAFRELGTCHN 41.4 (H) 07/24/2017   LAMBDASER 2.5 (L) 07/24/2017   KAPLAMBRATIO 16.56 (H) 07/24/2017   Lab Results  Component Value Date   IGGSERUM 641 (L) 07/24/2017   IGGSERUM 663 (L) 07/24/2017   IGA 14 (L) 07/24/2017   IGA 13 (L) 07/24/2017   IGMSERUM 452 (H) 07/24/2017   IGMSERUM 471 (H) 07/24/2017   Lab Results  Component Value Date   TOTALPROTELP 6.3 07/24/2017   ALBUMINELP 3.9 07/24/2017   A1GS 0.2 07/24/2017   A2GS 0.5 07/24/2017   BETS 0.9 07/24/2017   BETA2SER 0.2 02/15/2015   GAMS 0.9 07/24/2017   MSPIKE 0.4 (H) 07/24/2017   SPEI * 02/15/2015     Chemistry      Component Value Date/Time   NA 140 07/24/2017 0901   NA 147 (H) 03/19/2017 0849   NA 139 03/16/2016 1337   K 3.8 07/24/2017 0901   K 4.5 03/19/2017 0849   K 3.8 03/16/2016 1337   CL 108 07/24/2017 0901   CL 108 03/19/2017 0849   CO2 29 07/24/2017 0901   CO2 31 03/19/2017 0849   CO2  27 03/16/2016 1337   BUN 9 07/24/2017 0901   BUN 11 03/19/2017 0849   BUN 5.2 (L) 03/16/2016 1337   CREATININE 1.00 07/24/2017 0901   CREATININE 1.0 03/19/2017 0849   CREATININE 0.7 03/16/2016 1337      Component Value Date/Time   CALCIUM 9.1 07/24/2017 0901   CALCIUM 9.6 03/19/2017 0849   CALCIUM 8.8 03/16/2016 1337   ALKPHOS 62 07/24/2017 0901   ALKPHOS 53 03/19/2017 0849   ALKPHOS 62 03/16/2016 1337   AST 28 07/24/2017 0901   AST 24 03/16/2016 1337   ALT 26 07/24/2017 0901   ALT 23 03/19/2017 0849   ALT 19 03/16/2016 1337   BILITOT 1.6 07/24/2017 0901   BILITOT 0.57 03/16/2016 1337      Impression and Plan: [name] is a very pleasant 62 yo caucasian female with Waldenstrom's macroglobulinemia as well as acquired hypogammaglobulinemia.  She continues to do well on Imbruvica.  We will proceed with IVIG treatment today as planned.  We will plan to see her back in another 8 weeks for follow-up, lab and treatment.  She will contact our office with any questions or concerns. We can certainly see her sooner if need be.   Laverna Peace, NP 7/9/20199:55 AM

## 2017-09-25 NOTE — Patient Instructions (Signed)

## 2017-09-26 ENCOUNTER — Telehealth: Payer: Self-pay | Admitting: Pharmacist

## 2017-09-26 LAB — IGG, IGA, IGM
IGA: 14 mg/dL — AB (ref 87–352)
IGG (IMMUNOGLOBIN G), SERUM: 495 mg/dL — AB (ref 700–1600)
IGM (IMMUNOGLOBULIN M), SRM: 452 mg/dL — AB (ref 26–217)

## 2017-09-26 LAB — KAPPA/LAMBDA LIGHT CHAINS
Kappa free light chain: 36.6 mg/L — ABNORMAL HIGH (ref 3.3–19.4)
Kappa, lambda light chain ratio: 14.08 — ABNORMAL HIGH (ref 0.26–1.65)
Lambda free light chains: 2.6 mg/L — ABNORMAL LOW (ref 5.7–26.3)

## 2017-09-26 NOTE — Telephone Encounter (Signed)
Oral Chemotherapy Pharmacist Encounter  Follow-Up Form  Called patient today to follow up regarding patient's oral chemotherapy medication: ibrutinib  Original Start date of oral chemotherapy: 01/2014  Pt reports 0 tablets/doses of iburtinib missed so far this month.   Pt reports the following side effects: None reported  Recent labs reviewed: Ig panel from 09/25/17  New medications?: None reported  Other Issues: None reported  Patient knows to call the office with questions or concerns. Oral Oncology Clinic will continue to follow.  Darl Pikes, PharmD, BCPS, [facility] Hematology/Oncology Clinical Pharmacist [facility] [place] [facility] [phone]  09/26/2017 4:40 PM

## 2017-09-26 NOTE — Telephone Encounter (Signed)
Oral Chemotherapy Pharmacist Encounter   Attempted to reach patient for follow up on oral medication: Imbruvica (ibrutinib). No answer. Left VM for patient to call back.    Darl Pikes, PharmD, BCPS, [facility] Hematology/Oncology Clinical Pharmacist [facility] [place] [facility] [phone]  09/26/2017 9:47 AM

## 2017-09-27 LAB — PROTEIN ELECTROPHORESIS, SERUM, WITH REFLEX
A/G Ratio: 1.5 (ref 0.7–1.7)
ALPHA-1-GLOBULIN: 0.2 g/dL (ref 0.0–0.4)
ALPHA-2-GLOBULIN: 0.5 g/dL (ref 0.4–1.0)
Albumin ELP: 3.5 g/dL (ref 2.9–4.4)
Beta Globulin: 0.8 g/dL (ref 0.7–1.3)
GLOBULIN, TOTAL: 2.4 g/dL (ref 2.2–3.9)
Gamma Globulin: 0.8 g/dL (ref 0.4–1.8)
M-Spike, %: 0.3 g/dL — ABNORMAL HIGH
SPEP INTERP: 0
TOTAL PROTEIN ELP: 5.9 g/dL — AB (ref 6.0–8.5)

## 2017-09-27 LAB — IMMUNOFIXATION REFLEX, SERUM
IGA: 15 mg/dL — AB (ref 87–352)
IGM (IMMUNOGLOBULIN M), SRM: 478 mg/dL — AB (ref 26–217)
IgG (Immunoglobin G), Serum: 507 mg/dL — ABNORMAL LOW (ref 700–1600)

## 2017-10-04 ENCOUNTER — Telehealth: Payer: Self-pay | Admitting: *Deleted

## 2017-10-04 NOTE — Telephone Encounter (Signed)
Call received from patient requesting that IVIG be changed to every six weeks instead of every eight weeks as ordered d/t lab levels decreasing.  Dr. Marin Olp notified.  Call placed back to patient and pt notified per order of Dr. Marin Olp that IVIG will stay on every eight week schedule until her next appt in September and then we will change to every six weeks.  Patient appreciative of call back and has no further questions at this time.

## 2017-10-16 MED FILL — IMBRUVICA 420 MG TAB: 420 | 28 days supply | Qty: 28 | Fill #5

## 2017-11-13 MED FILL — CYANOCOBALAMIN 1,000 MCG/ML: 1000 | 60 days supply | Qty: 1 | Fill #3

## 2017-11-13 MED FILL — IMBRUVICA 420 MG TAB: 420 | 28 days supply | Qty: 28 | Fill #6

## 2017-11-27 ENCOUNTER — Inpatient Hospital Stay: Payer: Federal, State, Local not specified - PPO

## 2017-11-27 ENCOUNTER — Inpatient Hospital Stay: Payer: Federal, State, Local not specified - PPO | Attending: Hematology & Oncology | Admitting: Family

## 2017-11-27 VITALS — BP 118/72 | HR 72 | Temp 98.0°F | Resp 20

## 2017-11-27 VITALS — BP 123/58 | HR 75 | Temp 98.1°F | Resp 18 | Wt 118.8 lb

## 2017-11-27 DIAGNOSIS — D801 Nonfamilial hypogammaglobulinemia: Secondary | ICD-10-CM

## 2017-11-27 DIAGNOSIS — C8800 Waldenstrom macroglobulinemia not having achieved remission: Secondary | ICD-10-CM

## 2017-11-27 DIAGNOSIS — J329 Chronic sinusitis, unspecified: Secondary | ICD-10-CM | POA: Insufficient documentation

## 2017-11-27 DIAGNOSIS — J0101 Acute recurrent maxillary sinusitis: Secondary | ICD-10-CM

## 2017-11-27 LAB — CMP (CANCER CENTER ONLY)
ALK PHOS: 62 U/L (ref 26–84)
ALT: 25 U/L (ref 10–47)
AST: 23 U/L (ref 11–38)
Albumin: 4 g/dL (ref 3.5–5.0)
Anion gap: 0 — ABNORMAL LOW (ref 5–15)
BILIRUBIN TOTAL: 1.5 mg/dL (ref 0.2–1.6)
BUN: 11 mg/dL (ref 7–22)
CALCIUM: 8.8 mg/dL (ref 8.0–10.3)
CO2: 28 mmol/L (ref 18–33)
CREATININE: 0.8 mg/dL (ref 0.60–1.20)
Chloride: 111 mmol/L — ABNORMAL HIGH (ref 98–108)
Glucose, Bld: 96 mg/dL (ref 73–118)
Potassium: 3.8 mmol/L (ref 3.3–4.7)
SODIUM: 138 mmol/L (ref 128–145)
Total Protein: 6 g/dL — ABNORMAL LOW (ref 6.4–8.1)

## 2017-11-27 LAB — CBC WITH DIFFERENTIAL (CANCER CENTER ONLY)
BASOS PCT: 0 %
Basophils Absolute: 0 10*3/uL (ref 0.0–0.1)
EOS ABS: 0.1 10*3/uL (ref 0.0–0.5)
Eosinophils Relative: 1 %
HEMATOCRIT: 38.7 % (ref 34.8–46.6)
HEMOGLOBIN: 12.9 g/dL (ref 11.6–15.9)
Lymphocytes Relative: 21 %
Lymphs Abs: 1.6 10*3/uL (ref 0.9–3.3)
MCH: 34.7 pg — ABNORMAL HIGH (ref 26.0–34.0)
MCHC: 33.3 g/dL (ref 32.0–36.0)
MCV: 104 fL — ABNORMAL HIGH (ref 81.0–101.0)
Monocytes Absolute: 0.6 10*3/uL (ref 0.1–0.9)
Monocytes Relative: 7 %
NEUTROS ABS: 5.4 10*3/uL (ref 1.5–6.5)
NEUTROS PCT: 71 %
Platelet Count: 119 10*3/uL — ABNORMAL LOW (ref 145–400)
RBC: 3.72 MIL/uL (ref 3.70–5.32)
RDW: 12.4 % (ref 11.1–15.7)
WBC: 7.7 10*3/uL (ref 3.9–10.0)

## 2017-11-27 MED ORDER — GAMMAGARD 10 GM/100ML IJ SOLN
40.0000 g | Freq: Once | INTRAMUSCULAR | Status: AC
  Administered 2017-11-27: 40 g via INTRAVENOUS
  Filled 2017-11-27: qty 400

## 2017-11-27 MED ORDER — ACETAMINOPHEN 325 MG PO TABS
ORAL_TABLET | ORAL | Status: AC
  Filled 2017-11-27: qty 2

## 2017-11-27 MED ORDER — ACETAMINOPHEN 325 MG PO TABS
650.0000 mg | ORAL_TABLET | Freq: Once | ORAL | Status: AC
  Administered 2017-11-27: 650 mg via ORAL

## 2017-11-27 MED ORDER — DIPHENHYDRAMINE HCL 25 MG PO CAPS
ORAL_CAPSULE | ORAL | Status: AC
  Filled 2017-11-27: qty 1

## 2017-11-27 MED ORDER — DIPHENHYDRAMINE HCL 25 MG PO CAPS
25.0000 mg | ORAL_CAPSULE | Freq: Once | ORAL | Status: AC
  Administered 2017-11-27: 25 mg via ORAL

## 2017-11-27 NOTE — Progress Notes (Signed)
Hematology and Oncology Follow Up Visit  AZALEA CEDAR [id number] [birth year] 62 y.o. 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 Ang is here today for follow-up and treatment. She is doing well but has had some sinus congestion and drainage.  No fever, chills, n/v, rash, dizziness, SOB, chest pain, palpitations, abdominal pain or changes in bowel or bladder habits.  In July, her M-spike was 0.3 and IgG level was 495.  No adenopathy noted on her exam.  She has had no episodes of bleeding, no bruising or petechiae.  No swelling, tenderness, numbness or tingling in her extremities.  She has maintained a healthy appetite and remains Vegan. She hydrates well and her weight is stable.   ECOG Performance Status: 1 - Symptomatic but completely ambulatory  Medications:  Allergies as of 11/27/2017   No Known Allergies     Medication List        Accurate as of 11/27/17 10:42 AM. Always use your most recent med list.          cholecalciferol 1000 units tablet Commonly known as:  VITAMIN D Take 2,000 Units by mouth daily.   cyanocobalamin 1000 MCG/ML injection Commonly known as:  (VITAMIN B-12) INJECT 1 ML INTO THE MUSCLE EVERY 2 MONTHS   Ibrutinib 420 MG Tabs Take 420 mg by mouth daily after breakfast.       Allergies: No Known Allergies  Past Medical History, Surgical history, Social history, and Family History were reviewed and updated.  Review of Systems: All other 10 point review of systems is negative.   Physical Exam:  weight is 118 lb 12 oz (53.9 kg). Her oral temperature is 98.1 F (36.7 C). Her blood pressure is 123/58 (abnormal) and her pulse is 75. Her respiration is 18.   Wt Readings from Last 3  Encounters:  11/27/17 118 lb 12 oz (53.9 kg)  09/25/17 116 lb 4 oz (52.7 kg)  07/24/17 117 lb (53.1 kg)    Ocular: Sclerae unicteric, pupils equal, round and reactive to light Ear-nose-throat: Oropharynx clear, dentition fair Lymphatic: No cervical, supraclavicular or axillary adenopathy Lungs no rales or rhonchi, good excursion bilaterally Heart regular rate and rhythm, no murmur appreciated Abd soft, nontender, positive bowel sounds, no liver or spleen tip palpated on exam, no fluid wave  MSK no focal spinal tenderness, no joint edema Neuro: non-focal, well-oriented, appropriate affect Breasts: Deferred   Lab Results  Component Value Date   WBC 7.7 11/27/2017   HGB 12.9 11/27/2017   HCT 38.7 11/27/2017   MCV 104.0 (H) 11/27/2017   PLT 119 (L) 11/27/2017   No results found for: FERRITIN, IRON, TIBC, UIBC, IRONPCTSAT Lab Results  Component Value Date   RETICCTPCT 2.9 (H) 10/30/2014   RBC 3.72 11/27/2017   RETICCTABS 98.3 10/30/2014   Lab Results  Component Value Date   KPAFRELGTCHN 36.6 (H) 09/25/2017   LAMBDASER 2.6 (L) 09/25/2017   KAPLAMBRATIO 14.08 (H) 09/25/2017   Lab Results  Component Value Date   IGGSERUM 495 (L) 09/25/2017   IGGSERUM 507 (L) 09/25/2017   IGA 14 (L) 09/25/2017   IGA 15 (L) 09/25/2017   IGMSERUM 452 (H) 09/25/2017   IGMSERUM 478 (H) 09/25/2017   Lab Results  Component Value Date   TOTALPROTELP 5.9 (L)  09/25/2017   ALBUMINELP 3.5 09/25/2017   A1GS 0.2 09/25/2017   A2GS 0.5 09/25/2017   BETS 0.8 09/25/2017   BETA2SER 0.2 02/15/2015   GAMS 0.8 09/25/2017   MSPIKE 0.3 (H) 09/25/2017   SPEI * 02/15/2015     Chemistry      Component Value Date/Time   NA 138 11/27/2017 0950   NA 147 (H) 03/19/2017 0849   NA 139 03/16/2016 1337   K 3.8 11/27/2017 0950   K 4.5 03/19/2017 0849   K 3.8 03/16/2016 1337   CL 111 (H) 11/27/2017 0950   CL 108 03/19/2017 0849   CO2 28 11/27/2017 0950   CO2 31 03/19/2017 0849   CO2 27 03/16/2016 1337    BUN 11 11/27/2017 0950   BUN 11 03/19/2017 0849   BUN 5.2 (L) 03/16/2016 1337   CREATININE 0.80 11/27/2017 0950   CREATININE 1.0 03/19/2017 0849   CREATININE 0.7 03/16/2016 1337      Component Value Date/Time   CALCIUM 8.8 11/27/2017 0950   CALCIUM 9.6 03/19/2017 0849   CALCIUM 8.8 03/16/2016 1337   ALKPHOS 62 11/27/2017 0950   ALKPHOS 53 03/19/2017 0849   ALKPHOS 62 03/16/2016 1337   AST 23 11/27/2017 0950   AST 24 03/16/2016 1337   ALT 25 11/27/2017 0950   ALT 23 03/19/2017 0849   ALT 19 03/16/2016 1337   BILITOT 1.5 11/27/2017 0950   BILITOT 0.57 03/16/2016 1337      Impression and Plan: Ms. Ashton is a very pleasant 62 yo caucasian female with Waldenstrom's macroglobulinemia as well as acquired hypogammaglobulinemia.  She continues to do well but does have issues with mild sinus congestion and drainage at this time.  She verbalized that she is taking her Imbruvica as prescribed.  We will proceed with IVIG infusion today as planned.  We will plan to see her back in another 6 weeks for follow-up.  She will contact our office with any questions or concerns. We can certainly see her sooner if need be.   Laverna Peace, NP 9/10/201910:42 AM

## 2017-11-27 NOTE — Patient Instructions (Signed)

## 2017-11-28 LAB — PROTEIN ELECTROPHORESIS, SERUM
A/G Ratio: 1.9 — ABNORMAL HIGH (ref 0.7–1.7)
Albumin ELP: 4 g/dL (ref 2.9–4.4)
Alpha-1-Globulin: 0.2 g/dL (ref 0.0–0.4)
Alpha-2-Globulin: 0.5 g/dL (ref 0.4–1.0)
BETA GLOBULIN: 0.8 g/dL (ref 0.7–1.3)
Gamma Globulin: 0.6 g/dL (ref 0.4–1.8)
Globulin, Total: 2.1 g/dL — ABNORMAL LOW (ref 2.2–3.9)
M-Spike, %: 0.2 g/dL — ABNORMAL HIGH
Total Protein ELP: 6.1 g/dL (ref 6.0–8.5)

## 2017-11-28 LAB — IGG, IGA, IGM
IgA: 13 mg/dL — ABNORMAL LOW (ref 87–352)
IgG (Immunoglobin G), Serum: 448 mg/dL — ABNORMAL LOW (ref 700–1600)
IgM (Immunoglobulin M), Srm: 390 mg/dL — ABNORMAL HIGH (ref 26–217)

## 2017-11-29 LAB — KAPPA/LAMBDA LIGHT CHAINS
KAPPA FREE LGHT CHN: 31.6 mg/L — AB (ref 3.3–19.4)
KAPPA, LAMDA LIGHT CHAIN RATIO: 12.64 — AB (ref 0.26–1.65)
LAMDA FREE LIGHT CHAINS: 2.5 mg/L — AB (ref 5.7–26.3)

## 2017-12-11 MED FILL — IMBRUVICA 420 MG TAB: 420 | 28 days supply | Qty: 28 | Fill #7

## 2017-12-25 ENCOUNTER — Telehealth: Payer: Self-pay | Admitting: Pharmacist

## 2017-12-25 NOTE — Telephone Encounter (Signed)
Oral Chemotherapy Pharmacist Encounter  Follow-Up Form  Called patient today to follow up regarding patient's oral chemotherapy medication: ibrutinib  Original Start date of oral chemotherapy: 01/2014  Pt reports 0 tablets/doses of iburtinib missed so far this month.   Pt reports the following side effects: None reported  Recent labs reviewed: Ig panel from 09/25/17  New medications?: None reported  Other Issues: None reported  Patient knows to call the office with questions or concerns. Oral Oncology Clinic will continue to follow.  Darl Pikes, PharmD, BCPS, [facility] Hematology/Oncology Clinical Pharmacist [facility] [place] [facility] [phone]  12/25/2017 3:13 PM

## 2018-01-09 MED FILL — IMBRUVICA 420 MG TAB: 420 | 28 days supply | Qty: 28 | Fill #8

## 2018-01-09 MED FILL — CYANOCOBALAMIN 1,000 MCG/ML: 1000 | 60 days supply | Qty: 1 | Fill #4

## 2018-01-15 ENCOUNTER — Encounter: Payer: Self-pay | Admitting: Hematology & Oncology

## 2018-01-15 ENCOUNTER — Inpatient Hospital Stay: Payer: Federal, State, Local not specified - PPO | Attending: Hematology & Oncology | Admitting: Hematology & Oncology

## 2018-01-15 ENCOUNTER — Other Ambulatory Visit: Payer: Self-pay

## 2018-01-15 ENCOUNTER — Inpatient Hospital Stay: Payer: Federal, State, Local not specified - PPO

## 2018-01-15 VITALS — BP 114/58 | HR 79 | Temp 98.1°F | Resp 18

## 2018-01-15 VITALS — BP 143/75 | HR 78 | Temp 97.5°F | Resp 18 | Wt 118.4 lb

## 2018-01-15 DIAGNOSIS — C8800 Waldenstrom macroglobulinemia not having achieved remission: Secondary | ICD-10-CM

## 2018-01-15 DIAGNOSIS — J0101 Acute recurrent maxillary sinusitis: Secondary | ICD-10-CM

## 2018-01-15 DIAGNOSIS — D801 Nonfamilial hypogammaglobulinemia: Secondary | ICD-10-CM

## 2018-01-15 DIAGNOSIS — J329 Chronic sinusitis, unspecified: Secondary | ICD-10-CM

## 2018-01-15 LAB — CBC WITH DIFFERENTIAL (CANCER CENTER ONLY)
ABS IMMATURE GRANULOCYTES: 0.08 10*3/uL — AB (ref 0.00–0.07)
BASOS ABS: 0 10*3/uL (ref 0.0–0.1)
BASOS PCT: 0 %
Eosinophils Absolute: 0.1 10*3/uL (ref 0.0–0.5)
Eosinophils Relative: 1 %
HCT: 39.2 % (ref 36.0–46.0)
Hemoglobin: 13 g/dL (ref 12.0–15.0)
IMMATURE GRANULOCYTES: 1 %
Lymphocytes Relative: 28 %
Lymphs Abs: 1.7 10*3/uL (ref 0.7–4.0)
MCH: 34 pg (ref 26.0–34.0)
MCHC: 33.2 g/dL (ref 30.0–36.0)
MCV: 102.6 fL — ABNORMAL HIGH (ref 80.0–100.0)
Monocytes Absolute: 0.5 10*3/uL (ref 0.1–1.0)
Monocytes Relative: 8 %
NEUTROS ABS: 3.9 10*3/uL (ref 1.7–7.7)
Neutrophils Relative %: 62 %
PLATELETS: 110 10*3/uL — AB (ref 150–400)
RBC: 3.82 MIL/uL — AB (ref 3.87–5.11)
RDW: 12.5 % (ref 11.5–15.5)
WBC: 6.3 10*3/uL (ref 4.0–10.5)
nRBC: 0 % (ref 0.0–0.2)

## 2018-01-15 LAB — CMP (CANCER CENTER ONLY)
ALBUMIN: 4.2 g/dL (ref 3.5–5.0)
ALT: 23 U/L (ref 10–47)
AST: 28 U/L (ref 11–38)
Alkaline Phosphatase: 62 U/L (ref 26–84)
Anion gap: 0 — ABNORMAL LOW (ref 5–15)
BUN: 8 mg/dL (ref 7–22)
CO2: 29 mmol/L (ref 18–33)
Calcium: 9.2 mg/dL (ref 8.0–10.3)
Chloride: 111 mmol/L — ABNORMAL HIGH (ref 98–108)
Creatinine: 0.8 mg/dL (ref 0.60–1.20)
GLUCOSE: 95 mg/dL (ref 73–118)
POTASSIUM: 4.1 mmol/L (ref 3.3–4.7)
SODIUM: 140 mmol/L (ref 128–145)
TOTAL PROTEIN: 6.3 g/dL — AB (ref 6.4–8.1)
Total Bilirubin: 1.4 mg/dL (ref 0.2–1.6)

## 2018-01-15 MED ORDER — IMMUNE GLOBULIN (HUMAN) 20 GM/200ML IV SOLN
40.0000 g | Freq: Once | INTRAVENOUS | Status: AC
  Administered 2018-01-15: 40 g via INTRAVENOUS
  Filled 2018-01-15: qty 400

## 2018-01-15 MED ORDER — DEXTROSE 5 % IV SOLN
INTRAVENOUS | Status: DC
  Administered 2018-01-15: 09:00:00 via INTRAVENOUS
  Filled 2018-01-15: qty 250

## 2018-01-15 MED ORDER — DIPHENHYDRAMINE HCL 25 MG PO CAPS
ORAL_CAPSULE | ORAL | Status: AC
  Filled 2018-01-15: qty 1

## 2018-01-15 MED ORDER — DIPHENHYDRAMINE HCL 25 MG PO CAPS
25.0000 mg | ORAL_CAPSULE | Freq: Once | ORAL | Status: AC
  Administered 2018-01-15: 25 mg via ORAL

## 2018-01-15 MED ORDER — ACETAMINOPHEN 325 MG PO TABS
ORAL_TABLET | ORAL | Status: AC
  Filled 2018-01-15: qty 2

## 2018-01-15 MED ORDER — ACETAMINOPHEN 325 MG PO TABS
650.0000 mg | ORAL_TABLET | Freq: Once | ORAL | Status: AC
  Administered 2018-01-15: 650 mg via ORAL

## 2018-01-15 NOTE — Patient Instructions (Signed)

## 2018-01-15 NOTE — Progress Notes (Signed)
Hematology and Oncology Follow Up Visit  Kristy [name] [id number] April 14, 1955 62 y.o. 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  Her only complaint has been she is having some trouble.  I am not sure exactly what is going on.  It sounds like she is at a higher risk for developing narrow angle glaucoma.  I told her that I do not think that the [facility] had anything to do with that.  She has had no problems with nausea or vomiting.  She is had no problems with cough.  Her last IgM level was 390 mg/dL.  Her M spike was down to 0.2 g/dL.  She is had no change in bowel or bladder.  She is had no leg swelling.  She is had no rashes.  Overall, her performance status is ECOG 0.    Medications:  Allergies as of 01/15/2018   No Known Allergies     Medication List        Accurate as of 01/15/18  8:30 AM. Always use your most recent med list.          cholecalciferol 1000 units tablet Commonly known as:  VITAMIN D Take 2,000 Units by mouth daily.   cyanocobalamin 1000 MCG/ML injection Commonly known as:  (VITAMIN B-12) INJECT 1 ML INTO THE MUSCLE EVERY 2 MONTHS   Ibrutinib 420 MG Tabs Take 420 mg by mouth daily after breakfast.   XIIDRA 5 % Soln Generic drug:  Lifitegrast Apply 1 drop to eye 2 (two) times daily.       Allergies: No Known Allergies  Past Medical History, Surgical history, Social history, and Family History were reviewed and updated.  Review of Systems: Review of Systems  Constitutional: Negative.   HENT: Negative.   Eyes: Positive for blurred vision and discharge.  Respiratory: Negative.   Cardiovascular: Negative.   Gastrointestinal:  Negative.   Genitourinary: Negative.   Musculoskeletal: Negative.   Skin: Negative.   Neurological: Negative.   Endo/Heme/Allergies: Negative.   Psychiatric/Behavioral: Negative.       Physical Exam:  weight is 118 lb 6.4 oz (53.7 kg). Her oral temperature is 97.5 F (36.4 C) (abnormal). Her blood pressure is 143/75 (abnormal) and her pulse is 78. Her respiration is 18 and oxygen saturation is 96%.   Wt Readings from Last 3 Encounters:  01/15/18 118 lb 6.4 oz (53.7 kg)  11/27/17 118 lb 12 oz (53.9 kg)  09/25/17 116 lb 4 oz (52.7 kg)    Physical Exam  Constitutional: She is oriented to person, place, and time.  HENT:  Head: Normocephalic and atraumatic.  Mouth/Throat: Oropharynx is clear and moist.  Eyes: Pupils are equal, round, and reactive to light. EOM are normal.  Neck: Normal range of motion.  Cardiovascular: Normal rate, regular rhythm and normal heart sounds.  Pulmonary/Chest: Effort normal and breath sounds normal.  Abdominal: Soft. Bowel sounds are normal.  Musculoskeletal: Normal range of motion. She exhibits no edema, tenderness or deformity.  Lymphadenopathy:    She has no cervical adenopathy.  Neurological: She is alert and oriented to person, place, and time.  Skin: Skin is warm and dry. No rash noted. No erythema.  Psychiatric: She has a normal mood and  affect. Her behavior is normal. Judgment and thought content normal.  Vitals reviewed.    Lab Results  Component Value Date   WBC 6.3 01/15/2018   HGB 13.0 01/15/2018   HCT 39.2 01/15/2018   MCV 102.6 (H) 01/15/2018   PLT 110 (L) 01/15/2018   No results found for: FERRITIN, IRON, TIBC, UIBC, IRONPCTSAT Lab Results  Component Value Date   RETICCTPCT 2.9 (H) 10/30/2014   RBC 3.82 (L) 01/15/2018   RETICCTABS 98.3 10/30/2014   Lab Results  Component Value Date   KPAFRELGTCHN 31.6 (H) 11/27/2017   LAMBDASER 2.5 (L) 11/27/2017   KAPLAMBRATIO 12.64 (H) 11/27/2017   Lab Results  Component Value Date    IGGSERUM 448 (L) 11/27/2017   IGA 13 (L) 11/27/2017   IGMSERUM 390 (H) 11/27/2017   Lab Results  Component Value Date   TOTALPROTELP 6.1 11/27/2017   ALBUMINELP 4.0 11/27/2017   A1GS 0.2 11/27/2017   A2GS 0.5 11/27/2017   BETS 0.8 11/27/2017   BETA2SER 0.2 02/15/2015   GAMS 0.6 11/27/2017   MSPIKE 0.2 (H) 11/27/2017   SPEI Comment 11/27/2017     Chemistry      Component Value Date/Time   NA 140 01/15/2018 0748   NA 147 (H) 03/19/2017 0849   NA 139 03/16/2016 1337   K 4.1 01/15/2018 0748   K 4.5 03/19/2017 0849   K 3.8 03/16/2016 1337   CL 111 (H) 01/15/2018 0748   CL 108 03/19/2017 0849   CO2 29 01/15/2018 0748   CO2 31 03/19/2017 0849   CO2 27 03/16/2016 1337   BUN 8 01/15/2018 0748   BUN 11 03/19/2017 0849   BUN 5.2 (L) 03/16/2016 1337   CREATININE 0.80 01/15/2018 0748   CREATININE 1.0 03/19/2017 0849   CREATININE 0.7 03/16/2016 1337      Component Value Date/Time   CALCIUM 9.2 01/15/2018 0748   CALCIUM 9.6 03/19/2017 0849   CALCIUM 8.8 03/16/2016 1337   ALKPHOS 62 01/15/2018 0748   ALKPHOS 53 03/19/2017 0849   ALKPHOS 62 03/16/2016 1337   AST 28 01/15/2018 0748   AST 24 03/16/2016 1337   ALT 23 01/15/2018 0748   ALT 23 03/19/2017 0849   ALT 19 03/16/2016 1337   BILITOT 1.4 01/15/2018 0748   BILITOT 0.57 03/16/2016 1337      Impression and Plan: Kristy [name] is a very pleasant 62 yo white female with Waldenstrm's.  She is doing incredibly well.  The IgM level has not been a problem for her.  Again, I would not think that there should be any issue with respect to the eyes and her water symptoms.  I think the only issue that she would have is if she had a very high serum viscosity which she cannot have and this would cause some blurred vision.  The IVIG is clearly helping her with respect to the sinuses.  We will go ahead and plan to give her IVIG today.  We will then plan to get her back in 6 more weeks.  I want to make sure we continue to treat with the  IVIG so she can enjoy the holidays.  [name], MD 10/29/20198:30 AM

## 2018-01-16 LAB — PROTEIN ELECTROPHORESIS, SERUM
A/G Ratio: 1.7 (ref 0.7–1.7)
ALBUMIN ELP: 4 g/dL (ref 2.9–4.4)
ALPHA-1-GLOBULIN: 0.2 g/dL (ref 0.0–0.4)
Alpha-2-Globulin: 0.5 g/dL (ref 0.4–1.0)
Beta Globulin: 0.9 g/dL (ref 0.7–1.3)
GLOBULIN, TOTAL: 2.3 g/dL (ref 2.2–3.9)
Gamma Globulin: 0.8 g/dL (ref 0.4–1.8)
M-Spike, %: 0.3 g/dL — ABNORMAL HIGH
TOTAL PROTEIN ELP: 6.3 g/dL (ref 6.0–8.5)

## 2018-01-16 LAB — KAPPA/LAMBDA LIGHT CHAINS
KAPPA FREE LGHT CHN: 28.8 mg/L — AB (ref 3.3–19.4)
KAPPA, LAMDA LIGHT CHAIN RATIO: 12 — AB (ref 0.26–1.65)
LAMDA FREE LIGHT CHAINS: 2.4 mg/L — AB (ref 5.7–26.3)

## 2018-01-16 LAB — IGG, IGA, IGM
IGA: 14 mg/dL — AB (ref 87–352)
IGG (IMMUNOGLOBIN G), SERUM: 546 mg/dL — AB (ref 700–1600)
IgM (Immunoglobulin M), Srm: 392 mg/dL — ABNORMAL HIGH (ref 26–217)

## 2018-02-05 MED FILL — IMBRUVICA 420 MG TAB: 420 | 28 days supply | Qty: 28 | Fill #9

## 2018-02-26 ENCOUNTER — Other Ambulatory Visit: Payer: Self-pay

## 2018-02-26 ENCOUNTER — Inpatient Hospital Stay: Payer: Federal, State, Local not specified - PPO | Attending: Hematology & Oncology | Admitting: Hematology & Oncology

## 2018-02-26 ENCOUNTER — Inpatient Hospital Stay: Payer: Federal, State, Local not specified - PPO

## 2018-02-26 VITALS — BP 103/87 | HR 97 | Temp 98.2°F | Resp 20

## 2018-02-26 VITALS — BP 140/69 | HR 85 | Temp 97.9°F | Resp 18 | Wt 117.2 lb

## 2018-02-26 DIAGNOSIS — J0101 Acute recurrent maxillary sinusitis: Secondary | ICD-10-CM

## 2018-02-26 DIAGNOSIS — J329 Chronic sinusitis, unspecified: Secondary | ICD-10-CM | POA: Diagnosis not present

## 2018-02-26 DIAGNOSIS — C8800 Waldenstrom macroglobulinemia not having achieved remission: Secondary | ICD-10-CM

## 2018-02-26 DIAGNOSIS — D801 Nonfamilial hypogammaglobulinemia: Secondary | ICD-10-CM | POA: Diagnosis not present

## 2018-02-26 LAB — CBC WITH DIFFERENTIAL (CANCER CENTER ONLY)
Abs Immature Granulocytes: 0.08 10*3/uL — ABNORMAL HIGH (ref 0.00–0.07)
Basophils Absolute: 0 10*3/uL (ref 0.0–0.1)
Basophils Relative: 0 %
EOS ABS: 0.1 10*3/uL (ref 0.0–0.5)
EOS PCT: 1 %
HCT: 40.4 % (ref 36.0–46.0)
Hemoglobin: 13.2 g/dL (ref 12.0–15.0)
IMMATURE GRANULOCYTES: 2 %
LYMPHS ABS: 1.3 10*3/uL (ref 0.7–4.0)
Lymphocytes Relative: 27 %
MCH: 34.2 pg — AB (ref 26.0–34.0)
MCHC: 32.7 g/dL (ref 30.0–36.0)
MCV: 104.7 fL — AB (ref 80.0–100.0)
MONO ABS: 0.3 10*3/uL (ref 0.1–1.0)
Monocytes Relative: 7 %
Neutro Abs: 3.1 10*3/uL (ref 1.7–7.7)
Neutrophils Relative %: 63 %
Platelet Count: 108 10*3/uL — ABNORMAL LOW (ref 150–400)
RBC: 3.86 MIL/uL — ABNORMAL LOW (ref 3.87–5.11)
RDW: 12 % (ref 11.5–15.5)
WBC Count: 5 10*3/uL (ref 4.0–10.5)
nRBC: 0 % (ref 0.0–0.2)

## 2018-02-26 LAB — CMP (CANCER CENTER ONLY)
ALT: 15 U/L (ref 0–44)
AST: 20 U/L (ref 15–41)
Albumin: 4.5 g/dL (ref 3.5–5.0)
Alkaline Phosphatase: 53 U/L (ref 38–126)
Anion gap: 8 (ref 5–15)
BILIRUBIN TOTAL: 0.9 mg/dL (ref 0.3–1.2)
BUN: 9 mg/dL (ref 6–20)
CO2: 29 mmol/L (ref 22–32)
CREATININE: 0.74 mg/dL (ref 0.44–1.00)
Calcium: 9.3 mg/dL (ref 8.9–10.3)
Chloride: 105 mmol/L (ref 98–111)
GFR, Estimated: >60 mL/min (ref >60)
Glucose, Bld: 90 mg/dL (ref 70–99)
POTASSIUM: 4 mmol/L (ref 3.5–5.1)
Sodium: 142 mmol/L (ref 135–145)
Total Protein: 6.5 g/dL (ref 6.5–8.1)

## 2018-02-26 MED ORDER — DIPHENHYDRAMINE HCL 25 MG PO CAPS
25.0000 mg | ORAL_CAPSULE | Freq: Once | ORAL | Status: AC
  Administered 2018-02-26: 25 mg via ORAL

## 2018-02-26 MED ORDER — ACETAMINOPHEN 325 MG PO TABS
650.0000 mg | ORAL_TABLET | Freq: Once | ORAL | Status: AC
  Administered 2018-02-26: 650 mg via ORAL

## 2018-02-26 MED ORDER — IMMUNE GLOBULIN (HUMAN) 10 GM/100ML IV SOLN
40.0000 g | Freq: Once | INTRAVENOUS | Status: AC
  Administered 2018-02-26: 40 g via INTRAVENOUS
  Filled 2018-02-26: qty 400

## 2018-02-26 MED ORDER — DIPHENHYDRAMINE HCL 25 MG PO CAPS
ORAL_CAPSULE | ORAL | Status: AC
  Filled 2018-02-26: qty 1

## 2018-02-26 MED ORDER — DEXTROSE 5 % IV SOLN
INTRAVENOUS | Status: DC
  Administered 2018-02-26: 09:00:00 via INTRAVENOUS
  Filled 2018-02-26: qty 250

## 2018-02-26 MED ORDER — ACETAMINOPHEN 325 MG PO TABS
ORAL_TABLET | ORAL | Status: AC
  Filled 2018-02-26: qty 2

## 2018-02-26 NOTE — Progress Notes (Signed)
Hematology and Oncology Follow Up Visit  Kristy [name] [id number] 11/23/1955 62 y.o. 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  She really had a very nice Thanksgiving.  She was with her family.  Her granddaughter is now 64-1/2 years old.  She is still working.  She is having no problems with work.  When we last saw her, her M spike was 0.3 g/dL her IgM level was stable at 392 mg/dL.  She is had no problems with breathing.  There is been no issues with sinuses.  She is had no congestion.  There is been no change in bowel or bladder habits.  She has had no cardiac issues.  There is been no palpitations.   Overall, her performance status is ECOG 0.    Medications:  Allergies as of 02/26/2018   No Known Allergies     Medication List        Accurate as of 02/26/18  8:15 AM. Always use your most recent med list.          cyanocobalamin 1000 MCG/ML injection Commonly known as:  (VITAMIN B-12) INJECT 1 ML INTO THE MUSCLE EVERY 2 MONTHS   Ibrutinib 420 MG Tabs Take 420 mg by mouth daily after breakfast.   Vitamin D3 1.25 MG (50000 UT) Tabs Take by mouth.   XIIDRA 5 % Soln Generic drug:  Lifitegrast Apply 1 drop to eye 2 (two) times daily.       Allergies: No Known Allergies  Past Medical History, Surgical history, Social history, and Family History were reviewed and updated.  Review of Systems: Review of Systems  Constitutional: Negative.   HENT: Negative.   Eyes: Positive for blurred vision and discharge.  Respiratory: Negative.   Cardiovascular: Negative.   Gastrointestinal: Negative.   Genitourinary: Negative.   Musculoskeletal: Negative.   Skin:  Negative.   Neurological: Negative.   Endo/Heme/Allergies: Negative.   Psychiatric/Behavioral: Negative.       Physical Exam:  weight is 117 lb 4 oz (53.2 kg). Her oral temperature is 97.9 F (36.6 C). Her blood pressure is 140/69 and her pulse is 85. Her respiration is 18 and oxygen saturation is 98%.   Wt Readings from Last 3 Encounters:  02/26/18 117 lb 4 oz (53.2 kg)  01/15/18 118 lb 6.4 oz (53.7 kg)  11/27/17 118 lb 12 oz (53.9 kg)    Physical Exam  Constitutional: She is oriented to person, place, and time.  HENT:  Head: Normocephalic and atraumatic.  Mouth/Throat: Oropharynx is clear and moist.  Eyes: Pupils are equal, round, and reactive to light. EOM are normal.  Neck: Normal range of motion.  Cardiovascular: Normal rate, regular rhythm and normal heart sounds.  Pulmonary/Chest: Effort normal and breath sounds normal.  Abdominal: Soft. Bowel sounds are normal.  Musculoskeletal: Normal range of motion. She exhibits no edema, tenderness or deformity.  Lymphadenopathy:    She has no cervical adenopathy.  Neurological: She is alert and oriented to person, place, and time.  Skin: Skin is warm and dry. No rash noted. No erythema.  Psychiatric: She has a normal mood and affect. Her behavior is normal. Judgment and thought content normal.  Vitals reviewed.    Lab Results  Component Value Date   WBC 5.0 02/26/2018   HGB 13.2 02/26/2018   HCT 40.4 02/26/2018   MCV 104.7 (H) 02/26/2018   PLT 108 (L) 02/26/2018   No results found for: FERRITIN, IRON, TIBC, UIBC, IRONPCTSAT Lab Results  Component Value Date   RETICCTPCT 2.9 (H) 10/30/2014   RBC 3.86 (L) 02/26/2018   RETICCTABS 98.3 10/30/2014   Lab Results  Component Value Date   KPAFRELGTCHN 28.8 (H) 01/15/2018   LAMBDASER 2.4 (L) 01/15/2018   KAPLAMBRATIO 12.00 (H) 01/15/2018   Lab Results  Component Value Date   IGGSERUM 546 (L) 01/15/2018   IGA 14 (L) 01/15/2018   IGMSERUM 392 (H) 01/15/2018   Lab  Results  Component Value Date   TOTALPROTELP 6.3 01/15/2018   ALBUMINELP 4.0 01/15/2018   A1GS 0.2 01/15/2018   A2GS 0.5 01/15/2018   BETS 0.9 01/15/2018   BETA2SER 0.2 02/15/2015   GAMS 0.8 01/15/2018   MSPIKE 0.3 (H) 01/15/2018   SPEI Comment 01/15/2018     Chemistry      Component Value Date/Time   NA 140 01/15/2018 0748   NA 147 (H) 03/19/2017 0849   NA 139 03/16/2016 1337   K 4.1 01/15/2018 0748   K 4.5 03/19/2017 0849   K 3.8 03/16/2016 1337   CL 111 (H) 01/15/2018 0748   CL 108 03/19/2017 0849   CO2 29 01/15/2018 0748   CO2 31 03/19/2017 0849   CO2 27 03/16/2016 1337   BUN 8 01/15/2018 0748   BUN 11 03/19/2017 0849   BUN 5.2 (L) 03/16/2016 1337   CREATININE 0.80 01/15/2018 0748   CREATININE 1.0 03/19/2017 0849   CREATININE 0.7 03/16/2016 1337      Component Value Date/Time   CALCIUM 9.2 01/15/2018 0748   CALCIUM 9.6 03/19/2017 0849   CALCIUM 8.8 03/16/2016 1337   ALKPHOS 62 01/15/2018 0748   ALKPHOS 53 03/19/2017 0849   ALKPHOS 62 03/16/2016 1337   AST 28 01/15/2018 0748   AST 24 03/16/2016 1337   ALT 23 01/15/2018 0748   ALT 23 03/19/2017 0849   ALT 19 03/16/2016 1337   BILITOT 1.4 01/15/2018 0748   BILITOT 0.57 03/16/2016 1337      Impression and Plan: Kristy [name] is a very pleasant 61 yo white female with Waldenstrm's.  She is doing incredibly well.  The IgM level has not been a problem for her.     I think the only issue that she would have is if she had a very high serum viscosity which she cannot have and this would cause some blurred vision.  The IVIG is clearly helping her with respect to the sinuses.  We will go ahead and plan to give her IVIG today.  We will then plan to get her back in 6 more weeks.  I want to make sure we continue to treat with the IVIG so she can enjoy the holidays.  [name], MD 12/10/20198:15 AM

## 2018-02-26 NOTE — Patient Instructions (Signed)

## 2018-02-27 LAB — IGG, IGA, IGM
IgA: 14 mg/dL — ABNORMAL LOW (ref 87–352)
IgG (Immunoglobin G), Serum: 625 mg/dL — ABNORMAL LOW (ref 700–1600)
IgM (Immunoglobulin M), Srm: 363 mg/dL — ABNORMAL HIGH (ref 26–217)

## 2018-03-02 LAB — IMMUNOFIXATION REFLEX, SERUM
IGM (IMMUNOGLOBULIN M), SRM: 407 mg/dL — AB (ref 26–217)
IgA: 17 mg/dL — ABNORMAL LOW (ref 87–352)
IgG (Immunoglobin G), Serum: 763 mg/dL (ref 700–1600)

## 2018-03-02 LAB — PROTEIN ELECTROPHORESIS, SERUM, WITH REFLEX
A/G Ratio: 1.7 (ref 0.7–1.7)
Albumin ELP: 4 g/dL (ref 2.9–4.4)
Alpha-1-Globulin: 0.2 g/dL (ref 0.0–0.4)
Alpha-2-Globulin: 0.5 g/dL (ref 0.4–1.0)
Beta Globulin: 0.9 g/dL (ref 0.7–1.3)
Gamma Globulin: 0.8 g/dL (ref 0.4–1.8)
Globulin, Total: 2.4 g/dL (ref 2.2–3.9)
M-SPIKE, %: 0.3 g/dL — AB
SPEP Interpretation: 0
Total Protein ELP: 6.4 g/dL (ref 6.0–8.5)

## 2018-03-06 MED FILL — CYANOCOBALAMIN 1,000 MCG/ML: 1000 | 60 days supply | Qty: 1 | Fill #5

## 2018-03-06 MED FILL — IMBRUVICA 420 MG TAB: 420 | 28 days supply | Qty: 28 | Fill #10

## 2018-04-03 MED FILL — IMBRUVICA 420 MG TAB: 420 | 28 days supply | Qty: 28 | Fill #11

## 2018-04-08 ENCOUNTER — Telehealth: Payer: Self-pay | Admitting: Pharmacy Technician

## 2018-04-08 NOTE — Telephone Encounter (Signed)
Oral Oncology Patient Advocate Encounter  Prior Authorization for Kristy [name] has been approved.    PA# [id number] Effective dates: 03/09/18 through 04/08/2019  Patients co-pay is $60.00  Oral Oncology Clinic will continue to follow.   [place] [facility] [place] Phone [phone] Fax [phone] 04/08/2018 11:29 AM

## 2018-04-08 NOTE — Telephone Encounter (Signed)
Oral Oncology Patient Advocate Encounter  Received notification from [employer] that prior authorization for Imbruvica is required.  PA submitted on CoverMyMeds Key [id number] Status is pending  Oral Oncology Clinic will continue to follow.  [place] Patient [place] Phone [phone] Fax [phone] 04/08/2018 10:14 AM

## 2018-04-09 ENCOUNTER — Ambulatory Visit: Payer: Federal, State, Local not specified - PPO | Admitting: Hematology & Oncology

## 2018-04-09 ENCOUNTER — Other Ambulatory Visit: Payer: Federal, State, Local not specified - PPO

## 2018-04-09 ENCOUNTER — Ambulatory Visit: Payer: Federal, State, Local not specified - PPO

## 2018-04-16 ENCOUNTER — Inpatient Hospital Stay: Payer: Federal, State, Local not specified - PPO

## 2018-04-16 ENCOUNTER — Inpatient Hospital Stay (HOSPITAL_BASED_OUTPATIENT_CLINIC_OR_DEPARTMENT_OTHER): Payer: Federal, State, Local not specified - PPO | Admitting: Hematology & Oncology

## 2018-04-16 ENCOUNTER — Inpatient Hospital Stay: Payer: Federal, State, Local not specified - PPO | Attending: Hematology & Oncology

## 2018-04-16 VITALS — BP 112/59 | HR 85 | Temp 98.0°F | Resp 20

## 2018-04-16 VITALS — BP 125/77 | HR 85 | Temp 98.1°F | Resp 18 | Wt 118.8 lb

## 2018-04-16 DIAGNOSIS — J329 Chronic sinusitis, unspecified: Secondary | ICD-10-CM

## 2018-04-16 DIAGNOSIS — D801 Nonfamilial hypogammaglobulinemia: Secondary | ICD-10-CM | POA: Insufficient documentation

## 2018-04-16 DIAGNOSIS — J0101 Acute recurrent maxillary sinusitis: Secondary | ICD-10-CM

## 2018-04-16 DIAGNOSIS — C880 Waldenstrom macroglobulinemia: Secondary | ICD-10-CM | POA: Diagnosis not present

## 2018-04-16 LAB — CMP (CANCER CENTER ONLY)
ALT: 16 U/L (ref 0–44)
AST: 21 U/L (ref 15–41)
Albumin: 4.5 g/dL (ref 3.5–5.0)
Alkaline Phosphatase: 52 U/L (ref 38–126)
Anion gap: 8 (ref 5–15)
BUN: 15 mg/dL (ref 6–20)
CO2: 29 mmol/L (ref 22–32)
Calcium: 9.6 mg/dL (ref 8.9–10.3)
Chloride: 104 mmol/L (ref 98–111)
Creatinine: 0.7 mg/dL (ref 0.44–1.00)
GFR, Est AFR Am: >60 mL/min (ref >60)
GFR, Estimated: >60 mL/min (ref >60)
Glucose, Bld: 99 mg/dL (ref 70–99)
Potassium: 4.3 mmol/L (ref 3.5–5.1)
Sodium: 141 mmol/L (ref 135–145)
Total Bilirubin: 1.1 mg/dL (ref 0.3–1.2)
Total Protein: 6.5 g/dL (ref 6.5–8.1)

## 2018-04-16 LAB — CBC WITH DIFFERENTIAL (CANCER CENTER ONLY)
Abs Immature Granulocytes: 0.1 10*3/uL — ABNORMAL HIGH (ref 0.00–0.07)
Basophils Absolute: 0 10*3/uL (ref 0.0–0.1)
Basophils Relative: 1 %
Eosinophils Absolute: 0.1 10*3/uL (ref 0.0–0.5)
Eosinophils Relative: 1 %
HCT: 38.8 % (ref 36.0–46.0)
Hemoglobin: 13.2 g/dL (ref 12.0–15.0)
Immature Granulocytes: 2 %
Lymphocytes Relative: 27 %
Lymphs Abs: 1.5 10*3/uL (ref 0.7–4.0)
MCH: 34.9 pg — AB (ref 26.0–34.0)
MCHC: 34 g/dL (ref 30.0–36.0)
MCV: 102.6 fL — ABNORMAL HIGH (ref 80.0–100.0)
MONO ABS: 0.5 10*3/uL (ref 0.1–1.0)
Monocytes Relative: 9 %
NEUTROS ABS: 3.5 10*3/uL (ref 1.7–7.7)
Neutrophils Relative %: 60 %
Platelet Count: 116 10*3/uL — ABNORMAL LOW (ref 150–400)
RBC: 3.78 MIL/uL — ABNORMAL LOW (ref 3.87–5.11)
RDW: 12.4 % (ref 11.5–15.5)
WBC Count: 5.6 10*3/uL (ref 4.0–10.5)
nRBC: 0 % (ref 0.0–0.2)

## 2018-04-16 LAB — LACTATE DEHYDROGENASE: LDH: 213 U/L — ABNORMAL HIGH (ref 98–192)

## 2018-04-16 MED ORDER — ACETAMINOPHEN 325 MG PO TABS
650.0000 mg | ORAL_TABLET | Freq: Once | ORAL | Status: AC
  Administered 2018-04-16: 650 mg via ORAL

## 2018-04-16 MED ORDER — DEXTROSE 5 % IV SOLN
Freq: Once | INTRAVENOUS | Status: AC
  Administered 2018-04-16: 09:00:00 via INTRAVENOUS
  Filled 2018-04-16: qty 250

## 2018-04-16 MED ORDER — DIPHENHYDRAMINE HCL 25 MG PO CAPS
25.0000 mg | ORAL_CAPSULE | Freq: Once | ORAL | Status: AC
  Administered 2018-04-16: 25 mg via ORAL

## 2018-04-16 MED ORDER — DIPHENHYDRAMINE HCL 25 MG PO CAPS
ORAL_CAPSULE | ORAL | Status: AC
  Filled 2018-04-16: qty 1

## 2018-04-16 MED ORDER — IMMUNE GLOBULIN (HUMAN) 20 GM/200ML IV SOLN
40.0000 g | Freq: Once | INTRAVENOUS | Status: AC
  Administered 2018-04-16: 40 g via INTRAVENOUS
  Filled 2018-04-16: qty 400

## 2018-04-16 MED ORDER — ACETAMINOPHEN 325 MG PO TABS
ORAL_TABLET | ORAL | Status: AC
  Filled 2018-04-16: qty 2

## 2018-04-16 NOTE — Progress Notes (Signed)
Hematology and Oncology Follow Up Visit  Kristy [name] [id number] 24-Nov-1955 63 y.o. 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  So far, she is doing quite well.  She had a wonderful Christmas and New Year's.  She had a very nice Christmas with her family.  Her Waldenstrm's levels are stable.  When we last saw her back in December, her M spike was 0.3 g/dL.  Her IgM level with 363 mg/dL.  She is still working.  She is enjoying work.  She is had no problems with the ibrutinib.  She has had no issues with palpitations.  She has had no nausea or vomiting.  She had no diarrhea.  There is been no rashes.  Overall, her performance status is ECOG 0.    Medications:  Allergies as of 04/16/2018   No Known Allergies     Medication List       Accurate as of April 16, 2018  8:23 AM. Always use your most recent med list.        cyanocobalamin 1000 MCG/ML injection Commonly known as:  (VITAMIN B-12) INJECT 1 ML INTO THE MUSCLE EVERY 2 MONTHS   Ibrutinib 420 MG Tabs Commonly known as:  IMBRUVICA Take 420 mg by mouth daily after breakfast.   Vitamin D3 1.25 MG (50000 UT) Tabs Take by mouth.   XIIDRA 5 % Soln Generic drug:  Lifitegrast Apply 1 drop to eye 2 (two) times daily.       Allergies: No Known Allergies  Past Medical History, Surgical history, Social history, and Family History were reviewed and updated.  Review of Systems: Review of Systems  Constitutional: Negative.   HENT: Negative.   Eyes: Positive for blurred vision and discharge.  Respiratory: Negative.   Cardiovascular: Negative.   Gastrointestinal: Negative.   Genitourinary: Negative.     Musculoskeletal: Negative.   Skin: Negative.   Neurological: Negative.   Endo/Heme/Allergies: Negative.   Psychiatric/Behavioral: Negative.       Physical Exam:  weight is 118 lb 12 oz (53.9 kg). Her oral temperature is 98.1 F (36.7 C). Her blood pressure is 125/77 and her pulse is 85. Her respiration is 18.   Wt Readings from Last 3 Encounters:  04/16/18 118 lb 12 oz (53.9 kg)  02/26/18 117 lb 4 oz (53.2 kg)  01/15/18 118 lb 6.4 oz (53.7 kg)    Physical Exam Vitals signs reviewed.  HENT:     Head: Normocephalic and atraumatic.  Eyes:     Pupils: Pupils are equal, round, and reactive to light.  Neck:     Musculoskeletal: Normal range of motion.  Cardiovascular:     Rate and Rhythm: Normal rate and regular rhythm.     Heart sounds: Normal heart sounds.  Pulmonary:     Effort: Pulmonary effort is normal.     Breath sounds: Normal breath sounds.  Abdominal:     General: Bowel sounds are normal.     Palpations: Abdomen is soft.  Musculoskeletal: Normal range of motion.        General: No tenderness or deformity.  Lymphadenopathy:     Cervical: No cervical adenopathy.  Skin:    General: Skin is warm and dry.  Findings: No erythema or rash.  Neurological:     Mental Status: She is alert and oriented to person, place, and time.  Psychiatric:        Behavior: Behavior normal.        Thought Content: Thought content normal.        Judgment: Judgment normal.      Lab Results  Component Value Date   WBC 5.6 04/16/2018   HGB 13.2 04/16/2018   HCT 38.8 04/16/2018   MCV 102.6 (H) 04/16/2018   PLT 116 (L) 04/16/2018   No results found for: FERRITIN, IRON, TIBC, UIBC, IRONPCTSAT Lab Results  Component Value Date   RETICCTPCT 2.9 (H) 10/30/2014   RBC 3.78 (L) 04/16/2018   RETICCTABS 98.3 10/30/2014   Lab Results  Component Value Date   KPAFRELGTCHN 28.8 (H) 01/15/2018   LAMBDASER 2.4 (L) 01/15/2018   KAPLAMBRATIO 12.00 (H) 01/15/2018   Lab Results   Component Value Date   IGGSERUM 763 02/26/2018   IGA 17 (L) 02/26/2018   IGMSERUM 407 (H) 02/26/2018   Lab Results  Component Value Date   TOTALPROTELP 6.4 02/26/2018   ALBUMINELP 4.0 02/26/2018   A1GS 0.2 02/26/2018   A2GS 0.5 02/26/2018   BETS 0.9 02/26/2018   BETA2SER 0.2 02/15/2015   GAMS 0.8 02/26/2018   MSPIKE 0.3 (H) 02/26/2018   SPEI Comment 01/15/2018     Chemistry      Component Value Date/Time   NA 142 02/26/2018 0749   NA 147 (H) 03/19/2017 0849   NA 139 03/16/2016 1337   K 4.0 02/26/2018 0749   K 4.5 03/19/2017 0849   K 3.8 03/16/2016 1337   CL 105 02/26/2018 0749   CL 108 03/19/2017 0849   CO2 29 02/26/2018 0749   CO2 31 03/19/2017 0849   CO2 27 03/16/2016 1337   BUN 9 02/26/2018 0749   BUN 11 03/19/2017 0849   BUN 5.2 (L) 03/16/2016 1337   CREATININE 0.74 02/26/2018 0749   CREATININE 1.0 03/19/2017 0849   CREATININE 0.7 03/16/2016 1337      Component Value Date/Time   CALCIUM 9.3 02/26/2018 0749   CALCIUM 9.6 03/19/2017 0849   CALCIUM 8.8 03/16/2016 1337   ALKPHOS 53 02/26/2018 0749   ALKPHOS 53 03/19/2017 0849   ALKPHOS 62 03/16/2016 1337   AST 20 02/26/2018 0749   AST 24 03/16/2016 1337   ALT 15 02/26/2018 0749   ALT 23 03/19/2017 0849   ALT 19 03/16/2016 1337   BILITOT 0.9 02/26/2018 0749   BILITOT 0.57 03/16/2016 1337      Impression and Plan: Kristy [name] is a very pleasant 63 yo white female with Waldenstrm's.  She is doing incredibly well.  The IgM level has not been a problem for her.    We will go ahead and give her the IVIG today.  I think this would be reasonable.  We will see what her IgM level is in her M spike.  We will plan to get her back to see [place] in another 6 weeks.  I think this is a good timeframe for her to come back for a visit.     [name], MD 1/28/20208:23 AM

## 2018-04-16 NOTE — Patient Instructions (Signed)

## 2018-04-17 ENCOUNTER — Encounter: Payer: Self-pay | Admitting: *Deleted

## 2018-04-17 LAB — IGG, IGA, IGM
IGG (IMMUNOGLOBIN G), SERUM: 577 mg/dL — AB (ref 700–1600)
IgA: 14 mg/dL — ABNORMAL LOW (ref 87–352)
IgM (Immunoglobulin M), Srm: 343 mg/dL — ABNORMAL HIGH (ref 26–217)

## 2018-04-17 LAB — KAPPA/LAMBDA LIGHT CHAINS
Kappa free light chain: 23.4 mg/L — ABNORMAL HIGH (ref 3.3–19.4)
Kappa, lambda light chain ratio: 10.64 — ABNORMAL HIGH (ref 0.26–1.65)
Lambda free light chains: 2.2 mg/L — ABNORMAL LOW (ref 5.7–26.3)

## 2018-04-19 LAB — PROTEIN ELECTROPHORESIS, SERUM, WITH REFLEX
A/G RATIO SPE: 1.9 — AB (ref 0.7–1.7)
Albumin ELP: 4.1 g/dL (ref 2.9–4.4)
Alpha-1-Globulin: 0.2 g/dL (ref 0.0–0.4)
Alpha-2-Globulin: 0.5 g/dL (ref 0.4–1.0)
Beta Globulin: 0.9 g/dL (ref 0.7–1.3)
Gamma Globulin: 0.7 g/dL (ref 0.4–1.8)
Globulin, Total: 2.2 g/dL (ref 2.2–3.9)
M-Spike, %: 0.2 g/dL — ABNORMAL HIGH
SPEP Interpretation: 0
Total Protein ELP: 6.3 g/dL (ref 6.0–8.5)

## 2018-04-19 LAB — IMMUNOFIXATION REFLEX, SERUM
IgA: 15 mg/dL — ABNORMAL LOW (ref 87–352)
IgG (Immunoglobin G), Serum: 646 mg/dL — ABNORMAL LOW (ref 700–1600)
IgM (Immunoglobulin M), Srm: 371 mg/dL — ABNORMAL HIGH (ref 26–217)

## 2018-04-30 MED FILL — CYANOCOBALAMIN 1,000 MCG/ML: 1000 | 60 days supply | Qty: 1 | Fill #6

## 2018-04-30 MED FILL — IMBRUVICA 420 MG TAB: 420 | 28 days supply | Qty: 28 | Fill #12

## 2018-05-21 ENCOUNTER — Ambulatory Visit: Payer: Federal, State, Local not specified - PPO | Admitting: Hematology & Oncology

## 2018-05-21 ENCOUNTER — Other Ambulatory Visit: Payer: Federal, State, Local not specified - PPO

## 2018-05-24 ENCOUNTER — Other Ambulatory Visit: Payer: Self-pay | Admitting: Hematology & Oncology

## 2018-05-24 DIAGNOSIS — C880 Waldenstrom macroglobulinemia: Secondary | ICD-10-CM

## 2018-05-24 DIAGNOSIS — D801 Nonfamilial hypogammaglobulinemia: Secondary | ICD-10-CM

## 2018-05-24 DIAGNOSIS — J0101 Acute recurrent maxillary sinusitis: Secondary | ICD-10-CM

## 2018-05-28 MED FILL — IMBRUVICA 420 MG TAB: 420 | 28 days supply | Qty: 28 | Fill #0

## 2018-05-29 ENCOUNTER — Inpatient Hospital Stay: Payer: Federal, State, Local not specified - PPO

## 2018-05-29 ENCOUNTER — Encounter: Payer: Self-pay | Admitting: Hematology & Oncology

## 2018-05-29 ENCOUNTER — Inpatient Hospital Stay (HOSPITAL_BASED_OUTPATIENT_CLINIC_OR_DEPARTMENT_OTHER): Payer: Federal, State, Local not specified - PPO | Admitting: Hematology & Oncology

## 2018-05-29 ENCOUNTER — Other Ambulatory Visit: Payer: Self-pay

## 2018-05-29 ENCOUNTER — Inpatient Hospital Stay: Payer: Federal, State, Local not specified - PPO | Attending: Hematology & Oncology

## 2018-05-29 VITALS — BP 123/61 | HR 85 | Temp 98.2°F | Resp 17

## 2018-05-29 VITALS — BP 140/70 | HR 78 | Temp 97.8°F | Resp 20 | Wt 118.0 lb

## 2018-05-29 DIAGNOSIS — D801 Nonfamilial hypogammaglobulinemia: Secondary | ICD-10-CM | POA: Insufficient documentation

## 2018-05-29 DIAGNOSIS — C880 Waldenstrom macroglobulinemia: Secondary | ICD-10-CM

## 2018-05-29 DIAGNOSIS — J329 Chronic sinusitis, unspecified: Secondary | ICD-10-CM | POA: Diagnosis not present

## 2018-05-29 DIAGNOSIS — J0101 Acute recurrent maxillary sinusitis: Secondary | ICD-10-CM

## 2018-05-29 LAB — CMP (CANCER CENTER ONLY)
ALT: 14 U/L (ref 0–44)
AST: 20 U/L (ref 15–41)
Albumin: 4.6 g/dL (ref 3.5–5.0)
Alkaline Phosphatase: 56 U/L (ref 38–126)
Anion gap: 7 (ref 5–15)
BUN: 12 mg/dL (ref 8–23)
CO2: 30 mmol/L (ref 22–32)
CREATININE: 0.77 mg/dL (ref 0.44–1.00)
Calcium: 9.6 mg/dL (ref 8.9–10.3)
Chloride: 102 mmol/L (ref 98–111)
GFR, Est AFR Am: >60 mL/min (ref >60)
GFR, Estimated: >60 mL/min (ref >60)
Glucose, Bld: 96 mg/dL (ref 70–99)
Potassium: 4.1 mmol/L (ref 3.5–5.1)
Sodium: 139 mmol/L (ref 135–145)
Total Bilirubin: 0.9 mg/dL (ref 0.3–1.2)
Total Protein: 6.8 g/dL (ref 6.5–8.1)

## 2018-05-29 LAB — CBC WITH DIFFERENTIAL (CANCER CENTER ONLY)
ABS IMMATURE GRANULOCYTES: 0.16 10*3/uL — AB (ref 0.00–0.07)
BASOS PCT: 0 %
Basophils Absolute: 0 10*3/uL (ref 0.0–0.1)
Eosinophils Absolute: 0.1 10*3/uL (ref 0.0–0.5)
Eosinophils Relative: 2 %
HCT: 39.9 % (ref 36.0–46.0)
Hemoglobin: 13.4 g/dL (ref 12.0–15.0)
Immature Granulocytes: 3 %
Lymphocytes Relative: 20 %
Lymphs Abs: 1.3 10*3/uL (ref 0.7–4.0)
MCH: 35 pg — ABNORMAL HIGH (ref 26.0–34.0)
MCHC: 33.6 g/dL (ref 30.0–36.0)
MCV: 104.2 fL — ABNORMAL HIGH (ref 80.0–100.0)
Monocytes Absolute: 0.4 10*3/uL (ref 0.1–1.0)
Monocytes Relative: 6 %
Neutro Abs: 4.4 10*3/uL (ref 1.7–7.7)
Neutrophils Relative %: 69 %
PLATELETS: 114 10*3/uL — AB (ref 150–400)
RBC: 3.83 MIL/uL — ABNORMAL LOW (ref 3.87–5.11)
RDW: 12.3 % (ref 11.5–15.5)
WBC Count: 6.4 10*3/uL (ref 4.0–10.5)
nRBC: 0 % (ref 0.0–0.2)

## 2018-05-29 MED ORDER — IMMUNE GLOBULIN (HUMAN) 20 GM/200ML IV SOLN
40.0000 g | Freq: Once | INTRAVENOUS | Status: AC
  Administered 2018-05-29: 40 g via INTRAVENOUS
  Filled 2018-05-29: qty 400

## 2018-05-29 MED ORDER — ACETAMINOPHEN 325 MG PO TABS
ORAL_TABLET | ORAL | Status: AC
  Filled 2018-05-29: qty 2

## 2018-05-29 MED ORDER — DIPHENHYDRAMINE HCL 25 MG PO CAPS
ORAL_CAPSULE | ORAL | Status: AC
  Filled 2018-05-29: qty 1

## 2018-05-29 MED ORDER — DEXTROSE 5 % IV SOLN
INTRAVENOUS | Status: DC
  Administered 2018-05-29: 09:00:00 via INTRAVENOUS
  Filled 2018-05-29 (×2): qty 250

## 2018-05-29 MED ORDER — ACETAMINOPHEN 325 MG PO TABS
650.0000 mg | ORAL_TABLET | Freq: Once | ORAL | Status: AC
  Administered 2018-05-29: 650 mg via ORAL

## 2018-05-29 MED ORDER — DIPHENHYDRAMINE HCL 25 MG PO CAPS
25.0000 mg | ORAL_CAPSULE | Freq: Once | ORAL | Status: AC
  Administered 2018-05-29: 25 mg via ORAL

## 2018-05-29 NOTE — Progress Notes (Signed)
Hematology and Oncology Follow Up Visit  Kristy [name] [id number] 09/09/1955 63 y.o. 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  She is doing quite well.  She really feels okay.  She does have issues with allergies.  It might be more productive allergy season this year because we had a mild winter.  She is still working.  Her grandchild is still doing quite well.  She is very happy about this.  As far as her Kristy [name] is concerned, her last M spike was down to 0.2 g/dL.  Her IgM level was 343 mg/dL.  She has had no dizziness.  There is been no visual changes.  She has had no headaches.  There is been no change in bowel or bladder habits.  She has had no fever.  There is been no rash.  Overall, her performance status is ECOG 0.    Medications:  Allergies as of 05/29/2018   No Known Allergies     Medication List       Accurate as of May 29, 2018  8:25 AM. Always use your most recent med list.        cyanocobalamin 1000 MCG/ML injection Commonly known as:  (VITAMIN B-12) INJECT 1 ML INTO THE MUSCLE EVERY 2 MONTHS   Imbruvica 420 MG Tabs Generic drug:  Ibrutinib TAKE 1 TABLET BY MOUTH DAILY AFTER BREAKFAST.   Vitamin D3 1.25 MG (50000 UT) Tabs Take by mouth.   Xiidra 5 % Soln Generic drug:  Lifitegrast Apply 1 drop to eye 2 (two) times daily.       Allergies: No Known Allergies  Past Medical History, Surgical history, Social history, and Family History were reviewed and updated.  Review of Systems: Review of Systems  Constitutional: Negative.   HENT: Negative.   Eyes: Positive for blurred vision and discharge.  Respiratory: Negative.     Cardiovascular: Negative.   Gastrointestinal: Negative.   Genitourinary: Negative.   Musculoskeletal: Negative.   Skin: Negative.   Neurological: Negative.   Endo/Heme/Allergies: Negative.   Psychiatric/Behavioral: Negative.       Physical Exam:  weight is 118 lb (53.5 kg). Her oral temperature is 97.8 F (36.6 C). Her blood pressure is 140/70 and her pulse is 78. Her respiration is 20 and oxygen saturation is 99%.   Wt Readings from Last 3 Encounters:  05/29/18 118 lb (53.5 kg)  04/16/18 118 lb 12 oz (53.9 kg)  02/26/18 117 lb 4 oz (53.2 kg)    Physical Exam Vitals signs reviewed.  HENT:     Head: Normocephalic and atraumatic.  Eyes:     Pupils: Pupils are equal, round, and reactive to light.  Neck:     Musculoskeletal: Normal range of motion.  Cardiovascular:     Rate and Rhythm: Normal rate and regular rhythm.     Heart sounds: Normal heart sounds.  Pulmonary:     Effort: Pulmonary effort is normal.     Breath sounds: Normal breath sounds.  Abdominal:     General: Bowel sounds are normal.     Palpations: Abdomen is soft.  Musculoskeletal: Normal range of motion.        General: No tenderness or deformity.  Lymphadenopathy:  Cervical: No cervical adenopathy.  Skin:    General: Skin is warm and dry.     Findings: No erythema or rash.  Neurological:     Mental Status: She is alert and oriented to person, place, and time.  Psychiatric:        Behavior: Behavior normal.        Thought Content: Thought content normal.        Judgment: Judgment normal.      Lab Results  Component Value Date   WBC 6.4 05/29/2018   HGB 13.4 05/29/2018   HCT 39.9 05/29/2018   MCV 104.2 (H) 05/29/2018   PLT 114 (L) 05/29/2018   No results found for: FERRITIN, IRON, TIBC, UIBC, IRONPCTSAT Lab Results  Component Value Date   RETICCTPCT 2.9 (H) 10/30/2014   RBC 3.83 (L) 05/29/2018   RETICCTABS 98.3 10/30/2014   Lab Results  Component Value Date   KPAFRELGTCHN 23.4 (H)  04/16/2018   LAMBDASER 2.2 (L) 04/16/2018   KAPLAMBRATIO 10.64 (H) 04/16/2018   Lab Results  Component Value Date   IGGSERUM 577 (L) 04/16/2018   IGGSERUM 646 (L) 04/16/2018   IGA 14 (L) 04/16/2018   IGA 15 (L) 04/16/2018   IGMSERUM 343 (H) 04/16/2018   IGMSERUM 371 (H) 04/16/2018   Lab Results  Component Value Date   TOTALPROTELP 6.3 04/16/2018   ALBUMINELP 4.1 04/16/2018   A1GS 0.2 04/16/2018   A2GS 0.5 04/16/2018   BETS 0.9 04/16/2018   BETA2SER 0.2 02/15/2015   GAMS 0.7 04/16/2018   MSPIKE 0.2 (H) 04/16/2018   SPEI Comment 01/15/2018     Chemistry      Component Value Date/Time   NA 141 04/16/2018 0757   NA 147 (H) 03/19/2017 0849   NA 139 03/16/2016 1337   K 4.3 04/16/2018 0757   K 4.5 03/19/2017 0849   K 3.8 03/16/2016 1337   CL 104 04/16/2018 0757   CL 108 03/19/2017 0849   CO2 29 04/16/2018 0757   CO2 31 03/19/2017 0849   CO2 27 03/16/2016 1337   BUN 15 04/16/2018 0757   BUN 11 03/19/2017 0849   BUN 5.2 (L) 03/16/2016 1337   CREATININE 0.70 04/16/2018 0757   CREATININE 1.0 03/19/2017 0849   CREATININE 0.7 03/16/2016 1337      Component Value Date/Time   CALCIUM 9.6 04/16/2018 0757   CALCIUM 9.6 03/19/2017 0849   CALCIUM 8.8 03/16/2016 1337   ALKPHOS 52 04/16/2018 0757   ALKPHOS 53 03/19/2017 0849   ALKPHOS 62 03/16/2016 1337   AST 21 04/16/2018 0757   AST 24 03/16/2016 1337   ALT 16 04/16/2018 0757   ALT 23 03/19/2017 0849   ALT 19 03/16/2016 1337   BILITOT 1.1 04/16/2018 0757   BILITOT 0.57 03/16/2016 1337      Impression and Plan: Kristy [name] is a very pleasant 63 yo white female with Waldenstrm's.  She is doing incredibly well.  The IgM level has not been a problem for her.    We will go ahead and give her the IVIG today.  I think this would be reasonable.  We will see what her IgM level is in her M spike.  We will plan to get her back to see [place] in another 6 weeks.  I think this is a good timeframe for her to come back for a visit.      Kristy Napoleon, MD 3/11/20208:25 AM

## 2018-05-29 NOTE — Patient Instructions (Signed)

## 2018-05-30 ENCOUNTER — Encounter: Payer: Self-pay | Admitting: *Deleted

## 2018-05-30 ENCOUNTER — Telehealth: Payer: Self-pay | Admitting: *Deleted

## 2018-05-30 LAB — KAPPA/LAMBDA LIGHT CHAINS
Kappa free light chain: 22.9 mg/L — ABNORMAL HIGH (ref 3.3–19.4)
Kappa, lambda light chain ratio: 9.96 — ABNORMAL HIGH (ref 0.26–1.65)
Lambda free light chains: 2.3 mg/L — ABNORMAL LOW (ref 5.7–26.3)

## 2018-05-30 LAB — IGG, IGA, IGM
IgA: 15 mg/dL — ABNORMAL LOW (ref 87–352)
IgG (Immunoglobin G), Serum: 650 mg/dL — ABNORMAL LOW (ref 700–1600)
IgM (Immunoglobulin M), Srm: 334 mg/dL — ABNORMAL HIGH (ref 26–217)

## 2018-05-30 NOTE — Telephone Encounter (Addendum)
Message sent via [place]  ----- Message from [name], MD sent at 05/30/2018  6:49 AM EDT ----- Call - IgM is still coming down!!  Now is 334.  Great job!!  Kristy [name]

## 2018-05-31 LAB — PROTEIN ELECTROPHORESIS, SERUM, WITH REFLEX
A/G Ratio: 1.4 (ref 0.7–1.7)
Albumin ELP: 3.6 g/dL (ref 2.9–4.4)
Alpha-1-Globulin: 0.2 g/dL (ref 0.0–0.4)
Alpha-2-Globulin: 0.6 g/dL (ref 0.4–1.0)
Beta Globulin: 0.9 g/dL (ref 0.7–1.3)
GLOBULIN, TOTAL: 2.6 g/dL (ref 2.2–3.9)
Gamma Globulin: 0.8 g/dL (ref 0.4–1.8)
M-Spike, %: 0.2 g/dL — ABNORMAL HIGH
SPEP Interpretation: 0
Total Protein ELP: 6.2 g/dL (ref 6.0–8.5)

## 2018-05-31 LAB — IMMUNOFIXATION REFLEX, SERUM
IGA: 14 mg/dL — AB (ref 87–352)
IgG (Immunoglobin G), Serum: 631 mg/dL — ABNORMAL LOW (ref 700–1600)
IgM (Immunoglobulin M), Srm: 326 mg/dL — ABNORMAL HIGH (ref 26–217)

## 2018-06-25 MED FILL — IMBRUVICA 420 MG TAB: 420 | 28 days supply | Qty: 28 | Fill #1

## 2018-07-10 ENCOUNTER — Inpatient Hospital Stay: Payer: Federal, State, Local not specified - PPO

## 2018-07-10 ENCOUNTER — Inpatient Hospital Stay: Payer: Federal, State, Local not specified - PPO | Attending: Hematology & Oncology | Admitting: Hematology & Oncology

## 2018-07-10 ENCOUNTER — Telehealth: Payer: Self-pay | Admitting: Hematology & Oncology

## 2018-07-10 ENCOUNTER — Other Ambulatory Visit: Payer: Self-pay

## 2018-07-10 VITALS — BP 122/78 | HR 83 | Temp 97.5°F | Resp 20 | Wt 117.8 lb

## 2018-07-10 VITALS — BP 131/71 | HR 78 | Temp 97.8°F | Resp 20

## 2018-07-10 DIAGNOSIS — J329 Chronic sinusitis, unspecified: Secondary | ICD-10-CM | POA: Diagnosis not present

## 2018-07-10 DIAGNOSIS — D801 Nonfamilial hypogammaglobulinemia: Secondary | ICD-10-CM | POA: Insufficient documentation

## 2018-07-10 DIAGNOSIS — C880 Waldenstrom macroglobulinemia: Secondary | ICD-10-CM | POA: Insufficient documentation

## 2018-07-10 DIAGNOSIS — Z79899 Other long term (current) drug therapy: Secondary | ICD-10-CM | POA: Diagnosis not present

## 2018-07-10 DIAGNOSIS — J0101 Acute recurrent maxillary sinusitis: Secondary | ICD-10-CM

## 2018-07-10 LAB — CBC WITH DIFFERENTIAL (CANCER CENTER ONLY)
Abs Immature Granulocytes: 0.17 10*3/uL — ABNORMAL HIGH (ref 0.00–0.07)
Basophils Absolute: 0 10*3/uL (ref 0.0–0.1)
Basophils Relative: 1 %
Eosinophils Absolute: 0.1 10*3/uL (ref 0.0–0.5)
Eosinophils Relative: 1 %
HCT: 40 % (ref 36.0–46.0)
Hemoglobin: 13.6 g/dL (ref 12.0–15.0)
Immature Granulocytes: 3 %
Lymphocytes Relative: 28 %
Lymphs Abs: 1.9 10*3/uL (ref 0.7–4.0)
MCH: 35.1 pg — ABNORMAL HIGH (ref 26.0–34.0)
MCHC: 34 g/dL (ref 30.0–36.0)
MCV: 103.4 fL — ABNORMAL HIGH (ref 80.0–100.0)
Monocytes Absolute: 0.5 10*3/uL (ref 0.1–1.0)
Monocytes Relative: 8 %
Neutro Abs: 4.1 10*3/uL (ref 1.7–7.7)
Neutrophils Relative %: 59 %
Platelet Count: 112 10*3/uL — ABNORMAL LOW (ref 150–400)
RBC: 3.87 MIL/uL (ref 3.87–5.11)
RDW: 12.6 % (ref 11.5–15.5)
WBC Count: 6.8 10*3/uL (ref 4.0–10.5)
nRBC: 0 % (ref 0.0–0.2)

## 2018-07-10 LAB — CMP (CANCER CENTER ONLY)
ALT: 17 U/L (ref 0–44)
AST: 22 U/L (ref 15–41)
Albumin: 4.3 g/dL (ref 3.5–5.0)
Alkaline Phosphatase: 59 U/L (ref 38–126)
Anion gap: 9 (ref 5–15)
BUN: 9 mg/dL (ref 8–23)
CO2: 28 mmol/L (ref 22–32)
Calcium: 9.1 mg/dL (ref 8.9–10.3)
Chloride: 103 mmol/L (ref 98–111)
Creatinine: 0.77 mg/dL (ref 0.44–1.00)
GFR, Est AFR Am: >60 mL/min (ref >60)
GFR, Estimated: >60 mL/min (ref >60)
Glucose, Bld: 90 mg/dL (ref 70–99)
Potassium: 3.9 mmol/L (ref 3.5–5.1)
Sodium: 140 mmol/L (ref 135–145)
Total Bilirubin: 1.1 mg/dL (ref 0.3–1.2)
Total Protein: 6.3 g/dL — ABNORMAL LOW (ref 6.5–8.1)

## 2018-07-10 LAB — LACTATE DEHYDROGENASE: LDH: 221 U/L — ABNORMAL HIGH (ref 98–192)

## 2018-07-10 MED ORDER — DEXTROSE 5 % IV SOLN
INTRAVENOUS | Status: DC
  Administered 2018-07-10: 09:00:00 via INTRAVENOUS
  Filled 2018-07-10: qty 250

## 2018-07-10 MED ORDER — IMMUNE GLOBULIN (HUMAN) 20 GM/200ML IV SOLN
40.0000 g | Freq: Once | INTRAVENOUS | Status: AC
  Administered 2018-07-10: 40 g via INTRAVENOUS
  Filled 2018-07-10: qty 400

## 2018-07-10 MED ORDER — ACETAMINOPHEN 325 MG PO TABS
650.0000 mg | ORAL_TABLET | Freq: Once | ORAL | Status: AC
  Administered 2018-07-10: 650 mg via ORAL

## 2018-07-10 MED ORDER — DIPHENHYDRAMINE HCL 25 MG PO CAPS
ORAL_CAPSULE | ORAL | Status: AC
  Filled 2018-07-10: qty 1

## 2018-07-10 MED ORDER — ACETAMINOPHEN 325 MG PO TABS
ORAL_TABLET | ORAL | Status: AC
  Filled 2018-07-10: qty 2

## 2018-07-10 MED ORDER — DIPHENHYDRAMINE HCL 25 MG PO CAPS
25.0000 mg | ORAL_CAPSULE | Freq: Once | ORAL | Status: AC
  Administered 2018-07-10: 25 mg via ORAL

## 2018-07-10 NOTE — Progress Notes (Signed)
Hematology and Oncology Follow Up Visit  Kristy [name] [id number] [birth year] 63 y.o. 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  So far, she is making it through the coronavirus.  She has been very careful.  She has really done nicely.  The IVIG has helped quite a bit.  She has had no problems with the Imbruvica.  She has had no palpitations.  She has had no diarrhea.  Her last M spike was 0.2 g/dL.  Her IgM level was 326 mg/dL.  It is troubling her that she is not able to see her granddaughter like she would like.  Her husband is still working at the [place] [employer].  He likes to down there.  She has had no fever.  She has had no cough.  There has been no change in bowel or bladder habits.  Overall, her performance status is ECOG 0.    Medications:  Allergies as of 07/10/2018   No Known Allergies     Medication List       Accurate as of July 10, 2018  8:37 AM. Always use your most recent med list.        cyanocobalamin 1000 MCG/ML injection Commonly known as:  (VITAMIN B-12) INJECT 1 ML INTO THE MUSCLE EVERY 2 MONTHS   Imbruvica 420 MG Tabs Generic drug:  Ibrutinib TAKE 1 TABLET BY MOUTH DAILY AFTER BREAKFAST.   Vitamin D3 1.25 MG (50000 UT) Tabs Take by mouth.   Xiidra 5 % Soln Generic drug:  Lifitegrast Apply 1 drop to eye 2 (two) times daily.       Allergies: No Known Allergies  Past Medical History, Surgical history, Social history, and Family History were reviewed and updated.  Review of Systems: Review of Systems  Constitutional: Negative.   HENT: Negative.   Eyes: Positive for blurred vision and discharge.   Respiratory: Negative.   Cardiovascular: Negative.   Gastrointestinal: Negative.   Genitourinary: Negative.   Musculoskeletal: Negative.   Skin: Negative.   Neurological: Negative.   Endo/Heme/Allergies: Negative.   Psychiatric/Behavioral: Negative.       Physical Exam:  weight is 117 lb 12 oz (53.4 kg). Her oral temperature is 97.5 F (36.4 C) (abnormal). Her blood pressure is 122/78 and her pulse is 83. Her respiration is 20 and oxygen saturation is 97%.   Wt Readings from Last 3 Encounters:  07/10/18 117 lb 12 oz (53.4 kg)  05/29/18 118 lb (53.5 kg)  04/16/18 118 lb 12 oz (53.9 kg)    Physical Exam Vitals signs reviewed.  HENT:     Head: Normocephalic and atraumatic.  Eyes:     Pupils: Pupils are equal, round, and reactive to light.  Neck:     Musculoskeletal: Normal range of motion.  Cardiovascular:     Rate and Rhythm: Normal rate and regular rhythm.     Heart sounds: Normal heart sounds.  Pulmonary:     Effort: Pulmonary effort is normal.     Breath sounds: Normal breath sounds.  Abdominal:     General: Bowel sounds are normal.     Palpations: Abdomen is soft.  Musculoskeletal: Normal range of motion.        General:  No tenderness or deformity.  Lymphadenopathy:     Cervical: No cervical adenopathy.  Skin:    General: Skin is warm and dry.     Findings: No erythema or rash.  Neurological:     Mental Status: She is alert and oriented to person, place, and time.  Psychiatric:        Behavior: Behavior normal.        Thought Content: Thought content normal.        Judgment: Judgment normal.      Lab Results  Component Value Date   WBC 6.8 07/10/2018   HGB 13.6 07/10/2018   HCT 40.0 07/10/2018   MCV 103.4 (H) 07/10/2018   PLT 112 (L) 07/10/2018   No results found for: FERRITIN, IRON, TIBC, UIBC, IRONPCTSAT Lab Results  Component Value Date   RETICCTPCT 2.9 (H) 10/30/2014   RBC 3.87 07/10/2018   RETICCTABS 98.3 10/30/2014   Lab Results   Component Value Date   KPAFRELGTCHN 22.9 (H) 05/29/2018   LAMBDASER 2.3 (L) 05/29/2018   KAPLAMBRATIO 9.96 (H) 05/29/2018   Lab Results  Component Value Date   IGGSERUM 650 (L) 05/29/2018   IGGSERUM 631 (L) 05/29/2018   IGA 15 (L) 05/29/2018   IGA 14 (L) 05/29/2018   IGMSERUM 334 (H) 05/29/2018   IGMSERUM 326 (H) 05/29/2018   Lab Results  Component Value Date   TOTALPROTELP 6.2 05/29/2018   ALBUMINELP 3.6 05/29/2018   A1GS 0.2 05/29/2018   A2GS 0.6 05/29/2018   BETS 0.9 05/29/2018   BETA2SER 0.2 02/15/2015   GAMS 0.8 05/29/2018   MSPIKE 0.2 (H) 05/29/2018   SPEI Comment 01/15/2018     Chemistry      Component Value Date/Time   NA 140 07/10/2018 0754   NA 147 (H) 03/19/2017 0849   NA 139 03/16/2016 1337   K 3.9 07/10/2018 0754   K 4.5 03/19/2017 0849   K 3.8 03/16/2016 1337   CL 103 07/10/2018 0754   CL 108 03/19/2017 0849   CO2 28 07/10/2018 0754   CO2 31 03/19/2017 0849   CO2 27 03/16/2016 1337   BUN 9 07/10/2018 0754   BUN 11 03/19/2017 0849   BUN 5.2 (L) 03/16/2016 1337   CREATININE 0.77 07/10/2018 0754   CREATININE 1.0 03/19/2017 0849   CREATININE 0.7 03/16/2016 1337      Component Value Date/Time   CALCIUM 9.1 07/10/2018 0754   CALCIUM 9.6 03/19/2017 0849   CALCIUM 8.8 03/16/2016 1337   ALKPHOS 59 07/10/2018 0754   ALKPHOS 53 03/19/2017 0849   ALKPHOS 62 03/16/2016 1337   AST 22 07/10/2018 0754   AST 24 03/16/2016 1337   ALT 17 07/10/2018 0754   ALT 23 03/19/2017 0849   ALT 19 03/16/2016 1337   BILITOT 1.1 07/10/2018 0754   BILITOT 0.57 03/16/2016 1337      Impression and Plan: Kristy [name] is a very pleasant 63 yo white female with Waldenstrm's.  She is doing incredibly well.  The IgM level has not been a problem for her.    We will go ahead and give her the IVIG today.  I think this would be reasonable.  We will see what her IgM level is in her M spike.  We will plan to get her back to see [place] in another 6 weeks.  I think this is a good  timeframe for her to come back for a visit.     Kristy Napoleon, MD 4/22/20208:37 AM

## 2018-07-10 NOTE — Telephone Encounter (Signed)
SCHED 6/3 APPTS AT 0800 PER 4/22 LOS

## 2018-07-11 LAB — IGG, IGA, IGM
IgA: 14 mg/dL — ABNORMAL LOW (ref 87–352)
IgG (Immunoglobin G), Serum: 623 mg/dL (ref 586–1602)
IgM (Immunoglobulin M), Srm: 306 mg/dL — ABNORMAL HIGH (ref 26–217)

## 2018-07-11 LAB — KAPPA/LAMBDA LIGHT CHAINS
Kappa free light chain: 23.1 mg/L — ABNORMAL HIGH (ref 3.3–19.4)
Kappa, lambda light chain ratio: 10.5 — ABNORMAL HIGH (ref 0.26–1.65)
Lambda free light chains: 2.2 mg/L — ABNORMAL LOW (ref 5.7–26.3)

## 2018-07-15 LAB — PROTEIN ELECTROPHORESIS, SERUM, WITH REFLEX
A/G Ratio: 1.8 — ABNORMAL HIGH (ref 0.7–1.7)
Albumin ELP: 4 g/dL (ref 2.9–4.4)
Alpha-1-Globulin: 0.2 g/dL (ref 0.0–0.4)
Alpha-2-Globulin: 0.5 g/dL (ref 0.4–1.0)
Beta Globulin: 0.9 g/dL (ref 0.7–1.3)
Gamma Globulin: 0.7 g/dL (ref 0.4–1.8)
Globulin, Total: 2.2 g/dL (ref 2.2–3.9)
M-Spike, %: 0.3 g/dL — ABNORMAL HIGH
SPEP Interpretation: 0
Total Protein ELP: 6.2 g/dL (ref 6.0–8.5)

## 2018-07-15 LAB — IMMUNOFIXATION REFLEX, SERUM
IgA: 14 mg/dL — ABNORMAL LOW (ref 87–352)
IgG (Immunoglobin G), Serum: 629 mg/dL (ref 586–1602)
IgM (Immunoglobulin M), Srm: 298 mg/dL — ABNORMAL HIGH (ref 26–217)

## 2018-07-23 MED FILL — CYANOCOBALAMIN 1,000 MCG/ML: 1000 | 60 days supply | Qty: 1 | Fill #0

## 2018-07-23 MED FILL — IMBRUVICA 420 MG TAB: 420 | 28 days supply | Qty: 28 | Fill #2

## 2018-08-21 ENCOUNTER — Encounter: Payer: Self-pay | Admitting: Hematology & Oncology

## 2018-08-21 ENCOUNTER — Inpatient Hospital Stay: Payer: Federal, State, Local not specified - PPO | Attending: Hematology & Oncology | Admitting: Hematology & Oncology

## 2018-08-21 ENCOUNTER — Inpatient Hospital Stay: Payer: Federal, State, Local not specified - PPO

## 2018-08-21 ENCOUNTER — Other Ambulatory Visit: Payer: Self-pay

## 2018-08-21 VITALS — BP 122/68 | HR 76

## 2018-08-21 VITALS — BP 120/89 | HR 91 | Temp 97.6°F | Resp 17 | Wt 116.0 lb

## 2018-08-21 DIAGNOSIS — J329 Chronic sinusitis, unspecified: Secondary | ICD-10-CM | POA: Diagnosis not present

## 2018-08-21 DIAGNOSIS — D801 Nonfamilial hypogammaglobulinemia: Secondary | ICD-10-CM | POA: Diagnosis not present

## 2018-08-21 DIAGNOSIS — C880 Waldenstrom macroglobulinemia: Secondary | ICD-10-CM

## 2018-08-21 DIAGNOSIS — H538 Other visual disturbances: Secondary | ICD-10-CM | POA: Diagnosis not present

## 2018-08-21 DIAGNOSIS — J0101 Acute recurrent maxillary sinusitis: Secondary | ICD-10-CM

## 2018-08-21 LAB — CBC WITH DIFFERENTIAL (CANCER CENTER ONLY)
Abs Immature Granulocytes: 0.1 10*3/uL — ABNORMAL HIGH (ref 0.00–0.07)
Basophils Absolute: 0 10*3/uL (ref 0.0–0.1)
Basophils Relative: 0 %
Eosinophils Absolute: 0.1 10*3/uL (ref 0.0–0.5)
Eosinophils Relative: 1 %
HCT: 40.6 % (ref 36.0–46.0)
Hemoglobin: 13.5 g/dL (ref 12.0–15.0)
Immature Granulocytes: 1 %
Lymphocytes Relative: 27 %
Lymphs Abs: 2.2 10*3/uL (ref 0.7–4.0)
MCH: 34.4 pg — ABNORMAL HIGH (ref 26.0–34.0)
MCHC: 33.3 g/dL (ref 30.0–36.0)
MCV: 103.6 fL — ABNORMAL HIGH (ref 80.0–100.0)
Monocytes Absolute: 0.7 10*3/uL (ref 0.1–1.0)
Monocytes Relative: 9 %
Neutro Abs: 5.2 10*3/uL (ref 1.7–7.7)
Neutrophils Relative %: 62 %
Platelet Count: 104 10*3/uL — ABNORMAL LOW (ref 150–400)
RBC: 3.92 MIL/uL (ref 3.87–5.11)
RDW: 12.4 % (ref 11.5–15.5)
WBC Count: 8.2 10*3/uL (ref 4.0–10.5)
nRBC: 0 % (ref 0.0–0.2)

## 2018-08-21 LAB — CMP (CANCER CENTER ONLY)
ALT: 15 U/L (ref 0–44)
AST: 19 U/L (ref 15–41)
Albumin: 4.3 g/dL (ref 3.5–5.0)
Alkaline Phosphatase: 63 U/L (ref 38–126)
Anion gap: 7 (ref 5–15)
BUN: 13 mg/dL (ref 8–23)
CO2: 29 mmol/L (ref 22–32)
Calcium: 8.8 mg/dL — ABNORMAL LOW (ref 8.9–10.3)
Chloride: 105 mmol/L (ref 98–111)
Creatinine: 0.77 mg/dL (ref 0.44–1.00)
GFR, Est AFR Am: >60 mL/min (ref >60)
GFR, Estimated: >60 mL/min (ref >60)
Glucose, Bld: 97 mg/dL (ref 70–99)
Potassium: 4.5 mmol/L (ref 3.5–5.1)
Sodium: 141 mmol/L (ref 135–145)
Total Bilirubin: 1.6 mg/dL — ABNORMAL HIGH (ref 0.3–1.2)
Total Protein: 6.4 g/dL — ABNORMAL LOW (ref 6.5–8.1)

## 2018-08-21 LAB — LACTATE DEHYDROGENASE: LDH: 207 U/L — ABNORMAL HIGH (ref 98–192)

## 2018-08-21 MED ORDER — IMMUNE GLOBULIN (HUMAN) 20 GM/200ML IV SOLN
40.0000 g | Freq: Once | INTRAVENOUS | Status: AC
  Administered 2018-08-21: 40 g via INTRAVENOUS
  Filled 2018-08-21: qty 400

## 2018-08-21 MED ORDER — DIPHENHYDRAMINE HCL 25 MG PO CAPS
ORAL_CAPSULE | ORAL | Status: AC
  Filled 2018-08-21: qty 1

## 2018-08-21 MED ORDER — DIPHENHYDRAMINE HCL 25 MG PO CAPS
25.0000 mg | ORAL_CAPSULE | Freq: Once | ORAL | Status: AC
  Administered 2018-08-21: 25 mg via ORAL

## 2018-08-21 MED ORDER — DEXTROSE 5 % IV SOLN
INTRAVENOUS | Status: DC
  Administered 2018-08-21: 09:00:00 via INTRAVENOUS
  Filled 2018-08-21: qty 250

## 2018-08-21 MED ORDER — ACETAMINOPHEN 325 MG PO TABS
650.0000 mg | ORAL_TABLET | Freq: Once | ORAL | Status: AC
  Administered 2018-08-21: 650 mg via ORAL

## 2018-08-21 MED ORDER — ACETAMINOPHEN 325 MG PO TABS
ORAL_TABLET | ORAL | Status: AC
  Filled 2018-08-21: qty 2

## 2018-08-21 MED FILL — IMBRUVICA 420 MG TAB: 420 | 28 days supply | Qty: 28 | Fill #3

## 2018-08-21 NOTE — Progress Notes (Signed)
Hematology and Oncology Follow Up Visit  Kristy [name] [id number] 18-Mar-1956 63 y.o. 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 is here today for follow-up and treatment.  She is doing okay.  She does have problems with her right knee.  She says it probably happened after walking.  She is walking with her son.  She is done incredibly well with the Imbruvica.  Her last M spike was 0.3 g/dL.  Her IgM level was 300 mg/dL.  The IVIG replacement that she gets is helping her.  She does have some sinus issues but the IVIG really helps decrease the severity of her sinus infections.  She has had no problems with nausea or vomiting.  She has had no problems with cough.  Is been no shortness of breath.  She has had no fever.  She has had no bleeding.  There is no change in bowel or bladder habits.  Overall, her performance status is ECOG 0.    Medications:  Allergies as of 08/21/2018   No Known Allergies     Medication List       Accurate as of August 21, 2018  8:48 AM. If you have any questions, ask your nurse or doctor.        cyanocobalamin 1000 MCG/ML injection Commonly known as:  (VITAMIN B-12) INJECT 1 ML INTO THE MUSCLE EVERY 2 MONTHS   Imbruvica 420 MG Tabs Generic drug:  Ibrutinib TAKE 1 TABLET BY MOUTH DAILY AFTER BREAKFAST.   Vitamin D3 1.25 MG (50000 UT) Tabs Take by mouth.   Xiidra 5 % Soln Generic drug:  Lifitegrast Apply 1 drop to eye 2 (two) times daily.       Allergies: No Known Allergies  Past Medical History, Surgical history, Social history, and Family History were reviewed and updated.  Review of Systems: Review of Systems  Constitutional: Negative.   HENT: Negative.   Eyes: Positive for  blurred vision and discharge.  Respiratory: Negative.   Cardiovascular: Negative.   Gastrointestinal: Negative.   Genitourinary: Negative.   Musculoskeletal: Negative.   Skin: Negative.   Neurological: Negative.   Endo/Heme/Allergies: Negative.   Psychiatric/Behavioral: Negative.       Physical Exam:  weight is 116 lb (52.6 kg). Her oral temperature is 97.6 F (36.4 C). Her blood pressure is 120/89 and her pulse is 91. Her respiration is 17 and oxygen saturation is 100%.   Wt Readings from Last 3 Encounters:  08/21/18 116 lb (52.6 kg)  07/10/18 117 lb 12 oz (53.4 kg)  05/29/18 118 lb (53.5 kg)    Physical Exam Vitals signs reviewed.  HENT:     Head: Normocephalic and atraumatic.  Eyes:     Pupils: Pupils are equal, round, and reactive to light.  Neck:     Musculoskeletal: Normal range of motion.  Cardiovascular:     Rate and Rhythm: Normal rate and regular rhythm.     Heart sounds: Normal heart sounds.  Pulmonary:     Effort: Pulmonary effort is normal.     Breath sounds: Normal breath sounds.  Abdominal:     General: Bowel sounds are normal.     Palpations: Abdomen is soft.  Musculoskeletal: Normal range of motion.  General: No tenderness or deformity.  Lymphadenopathy:     Cervical: No cervical adenopathy.  Skin:    General: Skin is warm and dry.     Findings: No erythema or rash.  Neurological:     Mental Status: She is alert and oriented to person, place, and time.  Psychiatric:        Behavior: Behavior normal.        Thought Content: Thought content normal.        Judgment: Judgment normal.      Lab Results  Component Value Date   WBC 8.2 08/21/2018   HGB 13.5 08/21/2018   HCT 40.6 08/21/2018   MCV 103.6 (H) 08/21/2018   PLT 104 (L) 08/21/2018   No results found for: FERRITIN, IRON, TIBC, UIBC, IRONPCTSAT Lab Results  Component Value Date   RETICCTPCT 2.9 (H) 10/30/2014   RBC 3.92 08/21/2018   RETICCTABS 98.3 10/30/2014   Lab Results   Component Value Date   KPAFRELGTCHN 23.1 (H) 07/10/2018   LAMBDASER 2.2 (L) 07/10/2018   KAPLAMBRATIO 10.50 (H) 07/10/2018   Lab Results  Component Value Date   IGGSERUM 623 07/10/2018   IGGSERUM 629 07/10/2018   IGA 14 (L) 07/10/2018   IGA 14 (L) 07/10/2018   IGMSERUM 306 (H) 07/10/2018   IGMSERUM 298 (H) 07/10/2018   Lab Results  Component Value Date   TOTALPROTELP 6.2 07/10/2018   ALBUMINELP 4.0 07/10/2018   A1GS 0.2 07/10/2018   A2GS 0.5 07/10/2018   BETS 0.9 07/10/2018   BETA2SER 0.2 02/15/2015   GAMS 0.7 07/10/2018   MSPIKE 0.3 (H) 07/10/2018   SPEI Comment 01/15/2018     Chemistry      Component Value Date/Time   NA 140 07/10/2018 0754   NA 147 (H) 03/19/2017 0849   NA 139 03/16/2016 1337   K 3.9 07/10/2018 0754   K 4.5 03/19/2017 0849   K 3.8 03/16/2016 1337   CL 103 07/10/2018 0754   CL 108 03/19/2017 0849   CO2 28 07/10/2018 0754   CO2 31 03/19/2017 0849   CO2 27 03/16/2016 1337   BUN 9 07/10/2018 0754   BUN 11 03/19/2017 0849   BUN 5.2 (L) 03/16/2016 1337   CREATININE 0.77 07/10/2018 0754   CREATININE 1.0 03/19/2017 0849   CREATININE 0.7 03/16/2016 1337      Component Value Date/Time   CALCIUM 9.1 07/10/2018 0754   CALCIUM 9.6 03/19/2017 0849   CALCIUM 8.8 03/16/2016 1337   ALKPHOS 59 07/10/2018 0754   ALKPHOS 53 03/19/2017 0849   ALKPHOS 62 03/16/2016 1337   AST 22 07/10/2018 0754   AST 24 03/16/2016 1337   ALT 17 07/10/2018 0754   ALT 23 03/19/2017 0849   ALT 19 03/16/2016 1337   BILITOT 1.1 07/10/2018 0754   BILITOT 0.57 03/16/2016 1337      Impression and Plan: Kristy [name] is a very pleasant 63 yo white female with Waldenstrm's.  She is doing incredibly well.  The IgM level has not been a problem for her.    We will go ahead and give her the IVIG today.  I think this would be reasonable.  We will see what her IgM level is in her M spike.  We will plan to get her back to see [place] in another 6 weeks.  I think this is a good timeframe  for her to come back for a visit.     [name], MD 6/3/20208:48 AM

## 2018-08-21 NOTE — Patient Instructions (Signed)

## 2018-08-22 LAB — IGG, IGA, IGM
IgA: 13 mg/dL — ABNORMAL LOW (ref 87–352)
IgG (Immunoglobin G), Serum: 601 mg/dL (ref 586–1602)
IgM (Immunoglobulin M), Srm: 276 mg/dL — ABNORMAL HIGH (ref 26–217)

## 2018-08-22 LAB — KAPPA/LAMBDA LIGHT CHAINS
Kappa free light chain: 28.7 mg/L — ABNORMAL HIGH (ref 3.3–19.4)
Kappa, lambda light chain ratio: 9.9 — ABNORMAL HIGH (ref 0.26–1.65)
Lambda free light chains: 2.9 mg/L — ABNORMAL LOW (ref 5.7–26.3)

## 2018-08-26 LAB — PROTEIN ELECTROPHORESIS, SERUM, WITH REFLEX
A/G Ratio: 1.5 (ref 0.7–1.7)
Albumin ELP: 3.7 g/dL (ref 2.9–4.4)
Alpha-1-Globulin: 0.2 g/dL (ref 0.0–0.4)
Alpha-2-Globulin: 0.6 g/dL (ref 0.4–1.0)
Beta Globulin: 0.9 g/dL (ref 0.7–1.3)
Gamma Globulin: 0.7 g/dL (ref 0.4–1.8)
Globulin, Total: 2.4 g/dL (ref 2.2–3.9)
M-Spike, %: 0.3 g/dL — ABNORMAL HIGH
SPEP Interpretation: 0
Total Protein ELP: 6.1 g/dL (ref 6.0–8.5)

## 2018-08-26 LAB — IMMUNOFIXATION REFLEX, SERUM
IgA: 15 mg/dL — ABNORMAL LOW (ref 87–352)
IgG (Immunoglobin G), Serum: 707 mg/dL (ref 586–1602)
IgM (Immunoglobulin M), Srm: 316 mg/dL — ABNORMAL HIGH (ref 26–217)

## 2018-09-17 MED FILL — CYANOCOBALAMIN 1,000 MCG/ML: 1000 | 60 days supply | Qty: 1 | Fill #1

## 2018-09-17 MED FILL — IMBRUVICA 420 MG TAB: 420 | 28 days supply | Qty: 28 | Fill #4

## 2018-10-02 ENCOUNTER — Other Ambulatory Visit: Payer: Self-pay

## 2018-10-02 ENCOUNTER — Inpatient Hospital Stay: Payer: Federal, State, Local not specified - PPO | Attending: Hematology & Oncology | Admitting: Family

## 2018-10-02 ENCOUNTER — Inpatient Hospital Stay: Payer: Federal, State, Local not specified - PPO

## 2018-10-02 ENCOUNTER — Encounter: Payer: Self-pay | Admitting: Family

## 2018-10-02 ENCOUNTER — Telehealth: Payer: Self-pay | Admitting: Hematology & Oncology

## 2018-10-02 VITALS — BP 147/73 | HR 76 | Temp 97.9°F | Resp 18 | Ht 60.0 in | Wt 119.4 lb

## 2018-10-02 VITALS — BP 130/64 | HR 82 | Resp 18

## 2018-10-02 DIAGNOSIS — R0602 Shortness of breath: Secondary | ICD-10-CM | POA: Insufficient documentation

## 2018-10-02 DIAGNOSIS — C8800 Waldenstrom macroglobulinemia not having achieved remission: Secondary | ICD-10-CM

## 2018-10-02 DIAGNOSIS — D801 Nonfamilial hypogammaglobulinemia: Secondary | ICD-10-CM

## 2018-10-02 DIAGNOSIS — R0981 Nasal congestion: Secondary | ICD-10-CM | POA: Insufficient documentation

## 2018-10-02 DIAGNOSIS — J0101 Acute recurrent maxillary sinusitis: Secondary | ICD-10-CM

## 2018-10-02 DIAGNOSIS — J329 Chronic sinusitis, unspecified: Secondary | ICD-10-CM | POA: Insufficient documentation

## 2018-10-02 DIAGNOSIS — Z79899 Other long term (current) drug therapy: Secondary | ICD-10-CM | POA: Insufficient documentation

## 2018-10-02 LAB — CBC WITH DIFFERENTIAL (CANCER CENTER ONLY)
Abs Immature Granulocytes: 0.12 10*3/uL — ABNORMAL HIGH (ref 0.00–0.07)
Basophils Absolute: 0 10*3/uL (ref 0.0–0.1)
Basophils Relative: 0 %
Eosinophils Absolute: 0 10*3/uL (ref 0.0–0.5)
Eosinophils Relative: 1 %
HCT: 39.2 % (ref 36.0–46.0)
Hemoglobin: 13.1 g/dL (ref 12.0–15.0)
Immature Granulocytes: 2 %
Lymphocytes Relative: 25 %
Lymphs Abs: 1.5 10*3/uL (ref 0.7–4.0)
MCH: 34.9 pg — ABNORMAL HIGH (ref 26.0–34.0)
MCHC: 33.4 g/dL (ref 30.0–36.0)
MCV: 104.5 fL — ABNORMAL HIGH (ref 80.0–100.0)
Monocytes Absolute: 0.5 10*3/uL (ref 0.1–1.0)
Monocytes Relative: 8 %
Neutro Abs: 4 10*3/uL (ref 1.7–7.7)
Neutrophils Relative %: 64 %
Platelet Count: 98 10*3/uL — ABNORMAL LOW (ref 150–400)
RBC: 3.75 MIL/uL — ABNORMAL LOW (ref 3.87–5.11)
RDW: 12.4 % (ref 11.5–15.5)
WBC Count: 6.1 10*3/uL (ref 4.0–10.5)
nRBC: 0 % (ref 0.0–0.2)

## 2018-10-02 LAB — CMP (CANCER CENTER ONLY)
ALT: 19 U/L (ref 0–44)
AST: 23 U/L (ref 15–41)
Albumin: 4.1 g/dL (ref 3.5–5.0)
Alkaline Phosphatase: 56 U/L (ref 38–126)
Anion gap: 8 (ref 5–15)
BUN: 8 mg/dL (ref 8–23)
CO2: 29 mmol/L (ref 22–32)
Calcium: 8 mg/dL — ABNORMAL LOW (ref 8.9–10.3)
Chloride: 104 mmol/L (ref 98–111)
Creatinine: 0.81 mg/dL (ref 0.44–1.00)
GFR, Est AFR Am: >60 mL/min (ref >60)
GFR, Estimated: >60 mL/min (ref >60)
Glucose, Bld: 90 mg/dL (ref 70–99)
Potassium: 4.1 mmol/L (ref 3.5–5.1)
Sodium: 141 mmol/L (ref 135–145)
Total Bilirubin: 1.1 mg/dL (ref 0.3–1.2)
Total Protein: 5.9 g/dL — ABNORMAL LOW (ref 6.5–8.1)

## 2018-10-02 LAB — LACTATE DEHYDROGENASE: LDH: 221 U/L — ABNORMAL HIGH (ref 98–192)

## 2018-10-02 MED ORDER — DIPHENHYDRAMINE HCL 25 MG PO CAPS
ORAL_CAPSULE | ORAL | Status: AC
  Filled 2018-10-02: qty 1

## 2018-10-02 MED ORDER — ACETAMINOPHEN 325 MG PO TABS
ORAL_TABLET | ORAL | Status: AC
  Filled 2018-10-02: qty 2

## 2018-10-02 MED ORDER — ACETAMINOPHEN 325 MG PO TABS
650.0000 mg | ORAL_TABLET | Freq: Once | ORAL | Status: AC
  Administered 2018-10-02: 10:00:00 650 mg via ORAL

## 2018-10-02 MED ORDER — DIPHENHYDRAMINE HCL 25 MG PO CAPS
25.0000 mg | ORAL_CAPSULE | Freq: Once | ORAL | Status: AC
  Administered 2018-10-02: 25 mg via ORAL

## 2018-10-02 MED ORDER — IMMUNE GLOBULIN (HUMAN) 20 GM/200ML IV SOLN
40.0000 g | Freq: Once | INTRAVENOUS | Status: AC
  Administered 2018-10-02: 40 g via INTRAVENOUS
  Filled 2018-10-02: qty 400

## 2018-10-02 NOTE — Patient Instructions (Signed)
Immune Globulin Injection What is this medicine? IMMUNE GLOBULIN (im MUNE GLOB yoo lin) helps to prevent or reduce the severity of certain infections in patients who are at risk. This medicine is collected from the pooled blood of many donors. It is used to treat immune system problems, thrombocytopenia, and Kawasaki syndrome. This medicine may be used for other purposes; ask your health care provider or pharmacist if you have questions. COMMON BRAND NAME(S): ASCENIV, Baygam, BIVIGAM, Carimune, Carimune NF, cutaquig, Cuvitru, Flebogamma, Flebogamma DIF, GamaSTAN, GamaSTAN S/D, Gamimune N, Gammagard, Gammagard S/D, Gammaked, Gammaplex, Gammar-P IV, Gamunex, Gamunex-C, Hizentra, Iveegam, Iveegam EN, Octagam, Panglobulin, Panglobulin NF, panzyga, Polygam S/D, Privigen, Sandoglobulin, Venoglobulin-S, Vigam, Vivaglobulin, Xembify What should I tell my health care provider before I take this medicine? They need to know if you have any of these conditions:   diabetes   extremely low or no immune antibodies in the blood   heart disease   history of blood clots   hyperprolinemia   infection in the blood, sepsis   kidney disease   taking medicine that may change kidney function - ask your health care provider about your medicine   an unusual or allergic reaction to human immune globulin, albumin, maltose, sucrose, polysorbate 80, other medicines, foods, dyes, or preservatives   pregnant or trying to get pregnant   breast-feeding How should I use this medicine? This medicine is for injection into a muscle or infusion into a vein or skin. It is usually given by a health care professional in a hospital or clinic setting. In rare cases, some brands of this medicine might be given at home. You will be taught how to give this medicine. Use exactly as directed. Take your medicine at regular intervals. Do not take your medicine more often than directed. Talk to your pediatrician regarding  the use of this medicine in children. Special care may be needed. Overdosage: If you think you have taken too much of this medicine contact a poison control center or emergency room at once. NOTE: This medicine is only for you. Do not share this medicine with others. What if I miss a dose? It is important not to miss your dose. Call your doctor or health care professional if you are unable to keep an appointment. If you give yourself the medicine and you miss a dose, take it as soon as you can. If it is almost time for your next dose, take only that dose. Do not take double or extra doses. What may interact with this medicine?  aspirin and aspirin-like medicines  cisplatin  cyclosporine  medicines for infection like acyclovir, adefovir, amphotericin B, bacitracin, cidofovir, foscarnet, ganciclovir, gentamicin, pentamidine, vancomycin  NSAIDS, medicines for pain and inflammation, like ibuprofen or naproxen  pamidronate  vaccines  zoledronic acid This list may not describe all possible interactions. Give your health care provider a list of all the medicines, herbs, non-prescription drugs, or dietary supplements you use. Also tell them if you smoke, drink alcohol, or use illegal drugs. Some items may interact with your medicine. What should I watch for while using this medicine? Your condition will be monitored carefully while you are receiving this medicine. This medicine is made from pooled blood donations of many different people. It may be possible to pass an infection in this medicine. However, the donors are screened for infections and all products are tested for HIV and hepatitis. The medicine is treated to kill most or all bacteria and viruses. Talk to your doctor about   the risks and benefits of this medicine. Do not have vaccinations for at least 14 days before, or until at least 3 months after receiving this medicine. What side effects may I notice from receiving this  medicine? Side effects that you should report to your doctor or health care professional as soon as possible:  allergic reactions like skin rash, itching or hives, swelling of the face, lips, or tongue  breathing problems  chest pain or tightness  fever, chills  headache with nausea, vomiting  neck pain or difficulty moving neck  pain when moving eyes  pain, swelling, warmth in the leg  problems with balance, talking, walking  sudden weight gain  swelling of the ankles, feet, hands  trouble passing urine or change in the amount of urine Side effects that usually do not require medical attention (report to your doctor or health care professional if they continue or are bothersome):  dizzy, drowsy  flushing  increased sweating  leg cramps  muscle aches and pains  pain at site where injected This list may not describe all possible side effects. Call your doctor for medical advice about side effects. You may report side effects to FDA at 1-800-FDA-1088. Where should I keep my medicine? Keep out of the reach of children. This drug is usually given in a hospital or clinic and will not be stored at home. In rare cases, some brands of this medicine may be given at home. If you are using this medicine at home, you will be instructed on how to store this medicine. Throw away any unused medicine after the expiration date on the label. NOTE: This sheet is a summary. It may not cover all possible information. If you have questions about this medicine, talk to your doctor, pharmacist, or health care provider.  2020 Elsevier/Gold Standard (2008-05-27 11:44:49)  

## 2018-10-02 NOTE — Progress Notes (Signed)
Hematology and Oncology Follow Up Visit  Kristy [name] [id number] [birth year] 63 y.o. 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 She is doing well and has no complaints at this time.  She has stayed busy working and they are taking turns in her office working from home.  Platelet count today is 98, Hgb 13.1 and WBC count 6.1.  In June, M-spike was 0.3 and IgM level 276.  She has had no issue with bleeding. No abnormal bruising or petechiae.  No fever, chills, n/v, cough, rash, dizziness, chest pain, palpitations, abdominal pain or changes in bowel or bladder habits.  She has sinus drainage and headaches at times. She does note some SOB with exertion when wearing a mask which she attributes somewhat to the sinus congestion. No swelling, tenderness, numbness or tingling in her extremities.  She has maintained a good appetite and is staying well hydrated. Her weight is stable.    ECOG Performance Status: 1 - Symptomatic but completely ambulatory  Medications:  Allergies as of 10/02/2018   No Known Allergies     Medication List       Accurate as of October 02, 2018  8:38 AM. If you have any questions, ask your nurse or doctor.        cyanocobalamin 1000 MCG/ML injection Commonly known as: (VITAMIN B-12) INJECT 1 ML INTO THE MUSCLE EVERY 2 MONTHS   Imbruvica 420 MG Tabs Generic drug: Ibrutinib TAKE 1 TABLET BY MOUTH DAILY AFTER BREAKFAST.   Vitamin D3 1.25 MG (50000 UT) Tabs Take by mouth.   Xiidra 5 % Soln Generic drug: Lifitegrast Apply 1 drop to eye 2 (two) times daily.       Allergies: No Known Allergies  Past Medical History, Surgical history, Social history, and Family History were reviewed and updated.  Review of Systems:  All other 10 point review of systems is negative.   Physical Exam:  vitals were not taken for this visit.   Wt Readings from Last 3 Encounters:  08/21/18 116 lb (52.6 kg)  07/10/18 117 lb 12 oz (53.4 kg)  05/29/18 118 lb (53.5 kg)    Ocular: Sclerae unicteric, pupils equal, round and reactive to light Ear-nose-throat: Oropharynx clear, dentition fair Lymphatic: No cervical or supraclavicular adenopathy Lungs no rales or rhonchi, good excursion bilaterally Heart regular rate and rhythm, no murmur appreciated Abd soft, nontender, positive bowel sounds, no liver or spleen tip palpated on exam, no fluid wave  MSK no focal spinal tenderness, no joint edema Neuro: non-focal, well-oriented, appropriate affect Breasts: Deferred   Lab Results  Component Value Date   WBC 6.1 10/02/2018   HGB 13.1 10/02/2018   HCT 39.2 10/02/2018   MCV 104.5 (H) 10/02/2018   PLT 98 (L) 10/02/2018   No results found for: FERRITIN, IRON, TIBC, UIBC, IRONPCTSAT Lab Results  Component Value Date   RETICCTPCT 2.9 (H) 10/30/2014   RBC 3.75 (L) 10/02/2018   RETICCTABS 98.3 10/30/2014   Lab Results  Component Value Date   KPAFRELGTCHN 28.7 (H) 08/21/2018   LAMBDASER 2.9 (L) 08/21/2018   KAPLAMBRATIO 9.90 (H) 08/21/2018   Lab Results  Component Value Date   IGGSERUM 707 08/21/2018   IGA 15 (L) 08/21/2018   IGMSERUM 316 (H) 08/21/2018   Lab Results  Component Value Date  TOTALPROTELP 6.1 08/21/2018   ALBUMINELP 3.7 08/21/2018   A1GS 0.2 08/21/2018   A2GS 0.6 08/21/2018   BETS 0.9 08/21/2018   BETA2SER 0.2 02/15/2015   GAMS 0.7 08/21/2018   MSPIKE 0.3 (H) 08/21/2018   SPEI Comment 01/15/2018     Chemistry      Component Value Date/Time   NA 141 08/21/2018 0805   NA 147 (H) 03/19/2017 0849   NA 139 03/16/2016 1337   K 4.5 08/21/2018 0805   K 4.5 03/19/2017 0849   K 3.8 03/16/2016 1337   CL 105 08/21/2018 0805   CL 108 03/19/2017 0849   CO2 29 08/21/2018 0805   CO2 31 03/19/2017 0849    CO2 27 03/16/2016 1337   BUN 13 08/21/2018 0805   BUN 11 03/19/2017 0849   BUN 5.2 (L) 03/16/2016 1337   CREATININE 0.77 08/21/2018 0805   CREATININE 1.0 03/19/2017 0849   CREATININE 0.7 03/16/2016 1337      Component Value Date/Time   CALCIUM 8.8 (L) 08/21/2018 0805   CALCIUM 9.6 03/19/2017 0849   CALCIUM 8.8 03/16/2016 1337   ALKPHOS 63 08/21/2018 0805   ALKPHOS 53 03/19/2017 0849   ALKPHOS 62 03/16/2016 1337   AST 19 08/21/2018 0805   AST 24 03/16/2016 1337   ALT 15 08/21/2018 0805   ALT 23 03/19/2017 0849   ALT 19 03/16/2016 1337   BILITOT 1.6 (H) 08/21/2018 0805   BILITOT 0.57 03/16/2016 1337       Impression and Plan: Kristy [name] is a very pleasant 63 yo caucasian female with Waldenstrom's with acquired hypogammaglobulinemia and recurrent sinusitis.  We will proceed with IVIG today as planned.  Protein studies are pending.  We will plan to see her back in another 6 weeks.  She will contact our office with any questions or concerns. We can certainly see her sooner if needed.   Laverna Peace, NP 7/15/20208:38 AM

## 2018-10-02 NOTE — Telephone Encounter (Signed)
appointments made per 7/15 LOS. avs to print in infusion room

## 2018-10-03 LAB — KAPPA/LAMBDA LIGHT CHAINS
Kappa free light chain: 19.8 mg/L — ABNORMAL HIGH (ref 3.3–19.4)
Kappa, lambda light chain ratio: 8.25 — ABNORMAL HIGH (ref 0.26–1.65)
Lambda free light chains: 2.4 mg/L — ABNORMAL LOW (ref 5.7–26.3)

## 2018-10-03 LAB — IGG, IGA, IGM
IgA: 12 mg/dL — ABNORMAL LOW (ref 87–352)
IgG (Immunoglobin G), Serum: 529 mg/dL — ABNORMAL LOW (ref 586–1602)
IgM (Immunoglobulin M), Srm: 220 mg/dL — ABNORMAL HIGH (ref 26–217)

## 2018-10-08 LAB — PROTEIN ELECTROPHORESIS, SERUM, WITH REFLEX
A/G Ratio: 2.2 — ABNORMAL HIGH (ref 0.7–1.7)
Albumin ELP: 4 g/dL (ref 2.9–4.4)
Alpha-1-Globulin: 0.2 g/dL (ref 0.0–0.4)
Alpha-2-Globulin: 0.4 g/dL (ref 0.4–1.0)
Beta Globulin: 0.7 g/dL (ref 0.7–1.3)
Gamma Globulin: 0.4 g/dL (ref 0.4–1.8)
Globulin, Total: 1.8 g/dL — ABNORMAL LOW (ref 2.2–3.9)
M-Spike, %: 0.2 g/dL — ABNORMAL HIGH
SPEP Interpretation: 0
Total Protein ELP: 5.8 g/dL — ABNORMAL LOW (ref 6.0–8.5)

## 2018-10-08 LAB — IMMUNOFIXATION REFLEX, SERUM
IgA: 13 mg/dL — ABNORMAL LOW (ref 87–352)
IgG (Immunoglobin G), Serum: 577 mg/dL — ABNORMAL LOW (ref 586–1602)
IgM (Immunoglobulin M), Srm: 234 mg/dL — ABNORMAL HIGH (ref 26–217)

## 2018-10-16 MED FILL — IMBRUVICA 420 MG TAB: 420 | 28 days supply | Qty: 28 | Fill #5

## 2018-11-01 ENCOUNTER — Telehealth: Payer: Self-pay | Admitting: Pharmacist

## 2018-11-01 NOTE — Telephone Encounter (Signed)
Oral Chemotherapy Pharmacist Encounter   Spoke with patient today to follow up regarding patient's oral chemotherapy medication: Imbruvica (ibrutinib)  Original Start date of oral chemotherapy: 01/2014  Pt reports 0 tablets/doses of Imbruvica missed in the last month.   Pt reports the following side effects: none reported  Pertinent labs reviewed: CBC, CMET, and Ig panel from 10/02/18.  Other Issues: none reported  Patient knows to call the office with questions or concerns.  Leron Croak, PharmD PGY2 Oncology/Hematology Pharmacy Resident 11/01/2018 2:19 PM Oral Oncology Clinic [phone]

## 2018-11-12 ENCOUNTER — Inpatient Hospital Stay: Payer: Federal, State, Local not specified - PPO

## 2018-11-12 ENCOUNTER — Inpatient Hospital Stay (HOSPITAL_BASED_OUTPATIENT_CLINIC_OR_DEPARTMENT_OTHER): Payer: Federal, State, Local not specified - PPO | Admitting: Hematology & Oncology

## 2018-11-12 ENCOUNTER — Other Ambulatory Visit: Payer: Self-pay

## 2018-11-12 ENCOUNTER — Inpatient Hospital Stay: Payer: Federal, State, Local not specified - PPO | Attending: Hematology & Oncology

## 2018-11-12 VITALS — BP 127/88 | HR 77 | Temp 97.3°F | Resp 18 | Wt 118.0 lb

## 2018-11-12 VITALS — BP 125/63 | HR 73 | Temp 97.5°F | Resp 17

## 2018-11-12 DIAGNOSIS — D801 Nonfamilial hypogammaglobulinemia: Secondary | ICD-10-CM | POA: Diagnosis not present

## 2018-11-12 DIAGNOSIS — J0101 Acute recurrent maxillary sinusitis: Secondary | ICD-10-CM

## 2018-11-12 DIAGNOSIS — C880 Waldenstrom macroglobulinemia: Secondary | ICD-10-CM | POA: Diagnosis not present

## 2018-11-12 LAB — CMP (CANCER CENTER ONLY)
ALT: 19 U/L (ref 0–44)
AST: 21 U/L (ref 15–41)
Albumin: 3.9 g/dL (ref 3.5–5.0)
Alkaline Phosphatase: 59 U/L (ref 38–126)
Anion gap: 8 (ref 5–15)
BUN: 11 mg/dL (ref 8–23)
CO2: 29 mmol/L (ref 22–32)
Calcium: 8.4 mg/dL — ABNORMAL LOW (ref 8.9–10.3)
Chloride: 106 mmol/L (ref 98–111)
Creatinine: 0.79 mg/dL (ref 0.44–1.00)
GFR, Est AFR Am: >60 mL/min (ref >60)
GFR, Estimated: >60 mL/min (ref >60)
Glucose, Bld: 102 mg/dL — ABNORMAL HIGH (ref 70–99)
Potassium: 4.2 mmol/L (ref 3.5–5.1)
Sodium: 143 mmol/L (ref 135–145)
Total Bilirubin: 1.1 mg/dL (ref 0.3–1.2)
Total Protein: 6.1 g/dL — ABNORMAL LOW (ref 6.5–8.1)

## 2018-11-12 LAB — CBC WITH DIFFERENTIAL (CANCER CENTER ONLY)
Abs Immature Granulocytes: 0.1 10*3/uL — ABNORMAL HIGH (ref 0.00–0.07)
Basophils Absolute: 0 10*3/uL (ref 0.0–0.1)
Basophils Relative: 0 %
Eosinophils Absolute: 0.1 10*3/uL (ref 0.0–0.5)
Eosinophils Relative: 1 %
HCT: 38.7 % (ref 36.0–46.0)
Hemoglobin: 12.9 g/dL (ref 12.0–15.0)
Immature Granulocytes: 1 %
Lymphocytes Relative: 26 %
Lymphs Abs: 1.9 10*3/uL (ref 0.7–4.0)
MCH: 34.9 pg — ABNORMAL HIGH (ref 26.0–34.0)
MCHC: 33.3 g/dL (ref 30.0–36.0)
MCV: 104.6 fL — ABNORMAL HIGH (ref 80.0–100.0)
Monocytes Absolute: 0.5 10*3/uL (ref 0.1–1.0)
Monocytes Relative: 7 %
Neutro Abs: 4.7 10*3/uL (ref 1.7–7.7)
Neutrophils Relative %: 65 %
Platelet Count: 118 10*3/uL — ABNORMAL LOW (ref 150–400)
RBC: 3.7 MIL/uL — ABNORMAL LOW (ref 3.87–5.11)
RDW: 12.5 % (ref 11.5–15.5)
WBC Count: 7.3 10*3/uL (ref 4.0–10.5)
nRBC: 0 % (ref 0.0–0.2)

## 2018-11-12 LAB — LACTATE DEHYDROGENASE: LDH: 209 U/L — ABNORMAL HIGH (ref 98–192)

## 2018-11-12 MED ORDER — ACETAMINOPHEN 325 MG PO TABS
ORAL_TABLET | ORAL | Status: AC
  Filled 2018-11-12: qty 2

## 2018-11-12 MED ORDER — DEXTROSE 5 % IV SOLN
INTRAVENOUS | Status: DC
  Administered 2018-11-12: 09:00:00 via INTRAVENOUS
  Filled 2018-11-12: qty 250

## 2018-11-12 MED ORDER — IMMUNE GLOBULIN (HUMAN) 20 GM/200ML IV SOLN
40.0000 g | Freq: Once | INTRAVENOUS | Status: AC
  Administered 2018-11-12: 40 g via INTRAVENOUS
  Filled 2018-11-12: qty 400

## 2018-11-12 MED ORDER — ACETAMINOPHEN 325 MG PO TABS
650.0000 mg | ORAL_TABLET | Freq: Once | ORAL | Status: AC
  Administered 2018-11-12: 650 mg via ORAL

## 2018-11-12 MED ORDER — DIPHENHYDRAMINE HCL 25 MG PO CAPS
25.0000 mg | ORAL_CAPSULE | Freq: Once | ORAL | Status: AC
  Administered 2018-11-12: 09:00:00 25 mg via ORAL

## 2018-11-12 MED ORDER — DIPHENHYDRAMINE HCL 25 MG PO CAPS
ORAL_CAPSULE | ORAL | Status: AC
  Filled 2018-11-12: qty 1

## 2018-11-12 MED FILL — IMBRUVICA 420 MG TAB: 420 | 28 days supply | Qty: 28 | Fill #6

## 2018-11-12 MED FILL — CYANOCOBALAMIN 1,000 MCG/ML: 1000 | 60 days supply | Qty: 1 | Fill #2

## 2018-11-12 NOTE — Progress Notes (Signed)
Hematology and Oncology Follow Up Visit  Kristy [name] [id number] [birth year] 63 y.o. 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  She is happy to be in the office today.  She is having work done on her house.  It is quite stressful for her.  It sounds like she is having a significant remodeling done.  As far as her Waldenstrom's is concerned, she is doing quite well.  When we last saw her in July, her M spike was 0.2 g/dL.  Her IgM level was 220 mg/dL.  Her appetite has been good.  She has been trying to help with the 21-year-old grandson.  She has had no fever.  She has had no cough.  She is gained a little weight which she is worried about.  I told her that she does not need to lose any weight.  She has had no rashes.  There is been no leg swelling.  Overall, her performance status is ECOG 0.     Medications:  Allergies as of 11/12/2018   No Known Allergies     Medication List       Accurate as of November 12, 2018  8:23 AM. If you have any questions, ask your nurse or doctor.        cyanocobalamin 1000 MCG/ML injection Commonly known as: (VITAMIN B-12) INJECT 1 ML INTO THE MUSCLE EVERY 2 MONTHS   Imbruvica 420 MG Tabs Generic drug: Ibrutinib TAKE 1 TABLET BY MOUTH DAILY AFTER BREAKFAST.   Vitamin D3 1.25 MG (50000 UT) Tabs Take by mouth.   Xiidra 5 % Soln Generic drug: Lifitegrast Apply 1 drop to eye 2 (two) times daily.       Allergies: No Known Allergies  Past Medical History, Surgical history, Social history, and Family History were reviewed and updated.  Review of Systems: Review of Systems  Constitutional: Negative.   HENT: Negative.   Eyes: Negative.   Respiratory: Negative.   Cardiovascular: Negative.    Gastrointestinal: Negative.   Genitourinary: Negative.   Musculoskeletal: Negative.   Skin: Negative.   Neurological: Negative.   Endo/Heme/Allergies: Negative.   Psychiatric/Behavioral: The patient is nervous/anxious.       Physical Exam:  weight is 118 lb (53.5 kg). Her temporal temperature is 97.3 F (36.3 C) (abnormal). Her blood pressure is 127/88 and her pulse is 77. Her respiration is 18.   Wt Readings from Last 3 Encounters:  11/12/18 118 lb (53.5 kg)  10/02/18 119 lb 6.4 oz (54.2 kg)  08/21/18 116 lb (52.6 kg)    Physical Exam Vitals signs reviewed.  HENT:     Head: Normocephalic and atraumatic.  Eyes:     Pupils: Pupils are equal, round, and reactive to light.  Neck:     Musculoskeletal: Normal range of motion.  Cardiovascular:     Rate and Rhythm: Normal rate and regular rhythm.     Heart sounds: Normal heart sounds.  Pulmonary:     Effort: Pulmonary effort is normal.     Breath sounds: Normal breath sounds.  Abdominal:     General: Bowel sounds are normal.     Palpations: Abdomen is soft.  Musculoskeletal: Normal range of motion.        General: No tenderness or deformity.  Lymphadenopathy:  Cervical: No cervical adenopathy.  Skin:    General: Skin is warm and dry.     Findings: No erythema or rash.  Neurological:     Mental Status: She is alert and oriented to person, place, and time.  Psychiatric:        Behavior: Behavior normal.        Thought Content: Thought content normal.        Judgment: Judgment normal.      Lab Results  Component Value Date   WBC 7.3 11/12/2018   HGB 12.9 11/12/2018   HCT 38.7 11/12/2018   MCV 104.6 (H) 11/12/2018   PLT 118 (L) 11/12/2018   No results found for: FERRITIN, IRON, TIBC, UIBC, IRONPCTSAT Lab Results  Component Value Date   RETICCTPCT 2.9 (H) 10/30/2014   RBC 3.70 (L) 11/12/2018   RETICCTABS 98.3 10/30/2014   Lab Results  Component Value Date   KPAFRELGTCHN 19.8 (H) 10/02/2018   LAMBDASER  2.4 (L) 10/02/2018   KAPLAMBRATIO 8.25 (H) 10/02/2018   Lab Results  Component Value Date   IGGSERUM 529 (L) 10/02/2018   IGGSERUM 577 (L) 10/02/2018   IGA 12 (L) 10/02/2018   IGA 13 (L) 10/02/2018   IGMSERUM 220 (H) 10/02/2018   IGMSERUM 234 (H) 10/02/2018   Lab Results  Component Value Date   TOTALPROTELP 5.8 (L) 10/02/2018   ALBUMINELP 4.0 10/02/2018   A1GS 0.2 10/02/2018   A2GS 0.4 10/02/2018   BETS 0.7 10/02/2018   BETA2SER 0.2 02/15/2015   GAMS 0.4 10/02/2018   MSPIKE 0.2 (H) 10/02/2018   SPEI Comment 01/15/2018     Chemistry      Component Value Date/Time   NA 141 10/02/2018 0820   NA 147 (H) 03/19/2017 0849   NA 139 03/16/2016 1337   K 4.1 10/02/2018 0820   K 4.5 03/19/2017 0849   K 3.8 03/16/2016 1337   CL 104 10/02/2018 0820   CL 108 03/19/2017 0849   CO2 29 10/02/2018 0820   CO2 31 03/19/2017 0849   CO2 27 03/16/2016 1337   BUN 8 10/02/2018 0820   BUN 11 03/19/2017 0849   BUN 5.2 (L) 03/16/2016 1337   CREATININE 0.81 10/02/2018 0820   CREATININE 1.0 03/19/2017 0849   CREATININE 0.7 03/16/2016 1337      Component Value Date/Time   CALCIUM 8.0 (L) 10/02/2018 0820   CALCIUM 9.6 03/19/2017 0849   CALCIUM 8.8 03/16/2016 1337   ALKPHOS 56 10/02/2018 0820   ALKPHOS 53 03/19/2017 0849   ALKPHOS 62 03/16/2016 1337   AST 23 10/02/2018 0820   AST 24 03/16/2016 1337   ALT 19 10/02/2018 0820   ALT 23 03/19/2017 0849   ALT 19 03/16/2016 1337   BILITOT 1.1 10/02/2018 0820   BILITOT 0.57 03/16/2016 1337       Impression and Plan: Kristy [name] is a very pleasant 63 yo caucasian female with Waldenstrom's with acquired hypogammaglobulinemia and recurrent sinusitis.   We will proceed with IVIG today as planned.   Her studies for the Waldenstrom's look great.  Again, [name] is doing quite nicely for her.  We will continue to monitor her every 6 weeks.  Hopefully, the house remodeling will be done by the time we see her back.    [name], MD  8/25/20208:23 AM

## 2018-11-13 ENCOUNTER — Ambulatory Visit: Payer: Federal, State, Local not specified - PPO | Admitting: Hematology & Oncology

## 2018-11-13 ENCOUNTER — Other Ambulatory Visit: Payer: Federal, State, Local not specified - PPO

## 2018-11-13 ENCOUNTER — Ambulatory Visit: Payer: Federal, State, Local not specified - PPO

## 2018-11-13 LAB — IGG, IGA, IGM
IgA: 12 mg/dL — ABNORMAL LOW (ref 87–352)
IgG (Immunoglobin G), Serum: 598 mg/dL (ref 586–1602)
IgM (Immunoglobulin M), Srm: 203 mg/dL (ref 26–217)

## 2018-11-13 LAB — KAPPA/LAMBDA LIGHT CHAINS
Kappa free light chain: 19.7 mg/L — ABNORMAL HIGH (ref 3.3–19.4)
Kappa, lambda light chain ratio: 5.79 — ABNORMAL HIGH (ref 0.26–1.65)
Lambda free light chains: 3.4 mg/L — ABNORMAL LOW (ref 5.7–26.3)

## 2018-11-13 LAB — PROTEIN ELECTROPHORESIS, SERUM
A/G Ratio: 1.5 (ref 0.7–1.7)
Albumin ELP: 3.5 g/dL (ref 2.9–4.4)
Alpha-1-Globulin: 0.2 g/dL (ref 0.0–0.4)
Alpha-2-Globulin: 0.5 g/dL (ref 0.4–1.0)
Beta Globulin: 0.9 g/dL (ref 0.7–1.3)
Gamma Globulin: 0.7 g/dL (ref 0.4–1.8)
Globulin, Total: 2.3 g/dL (ref 2.2–3.9)
M-Spike, %: 0.3 g/dL — ABNORMAL HIGH
Total Protein ELP: 5.8 g/dL — ABNORMAL LOW (ref 6.0–8.5)

## 2018-11-14 ENCOUNTER — Telehealth: Payer: Self-pay | Admitting: *Deleted

## 2018-11-14 NOTE — Telephone Encounter (Signed)
-----   Message from [name], MD sent at 11/14/2018  9:10 AM EDT ----- Call - the levels are holding stable!!!  Kristy [name]

## 2018-11-14 NOTE — Telephone Encounter (Signed)
Call back from patient and patient notified per order of Dr. Marin Olp that "the levels are holding stable."  Pt appreciative of call and has no questions or concerns at this time.

## 2018-12-10 MED FILL — IMBRUVICA 420 MG TAB: 420 | 28 days supply | Qty: 28 | Fill #7

## 2018-12-11 ENCOUNTER — Telehealth: Payer: Self-pay | Admitting: Pharmacist

## 2018-12-11 DIAGNOSIS — B49 Unspecified mycosis: Secondary | ICD-10-CM

## 2018-12-11 NOTE — Telephone Encounter (Signed)
Oral Chemotherapy Pharmacist Encounter   Received a call from [facility] letting me know that Kristy [name] reported taking a new medication that could possibly interact with her ibrutinib. I called Kristy [name] for additional information. She is now taking fluconazole 200mg  two times weekly for 3 months due to a foot fungus. She started on 11/29/2018. There is an interaction between ibrutinib and fluconazole where fluconazole may increase the concentration of ibrutinib. Usually for managing this interaction, a dose decrease to ibrutinib 280mg  is recommended. Given the low dose and low frequency of the fluconazole, I don't think an adjustment needs to be made to her ibrutinib at this time.   When asked if the has noticed any new medication side effects since starting the fulconazole, she stated no. I explained the potential interaction and instructed her to let the office know if she starts to experience any new ibrutinib side effects. She stated her understanding and appreciation for the call. Fluconazole was added to her medication list.   Darl Pikes, PharmD, BCPS, [facility] Hematology/Oncology Clinical Pharmacist [facility] [phone]  12/11/2018 3:32 PM

## 2018-12-17 ENCOUNTER — Encounter: Payer: Self-pay | Admitting: Hematology & Oncology

## 2018-12-17 ENCOUNTER — Inpatient Hospital Stay: Payer: Federal, State, Local not specified - PPO

## 2018-12-17 ENCOUNTER — Other Ambulatory Visit: Payer: Self-pay

## 2018-12-17 ENCOUNTER — Inpatient Hospital Stay: Payer: Federal, State, Local not specified - PPO | Attending: Hematology & Oncology | Admitting: Hematology & Oncology

## 2018-12-17 VITALS — BP 146/72 | HR 85 | Temp 97.1°F | Resp 18 | Ht 60.0 in | Wt 118.4 lb

## 2018-12-17 VITALS — BP 130/69 | HR 93 | Temp 97.5°F | Resp 18

## 2018-12-17 DIAGNOSIS — J329 Chronic sinusitis, unspecified: Secondary | ICD-10-CM | POA: Diagnosis not present

## 2018-12-17 DIAGNOSIS — C880 Waldenstrom macroglobulinemia: Secondary | ICD-10-CM

## 2018-12-17 DIAGNOSIS — Z79899 Other long term (current) drug therapy: Secondary | ICD-10-CM | POA: Insufficient documentation

## 2018-12-17 DIAGNOSIS — D801 Nonfamilial hypogammaglobulinemia: Secondary | ICD-10-CM | POA: Diagnosis not present

## 2018-12-17 DIAGNOSIS — J0101 Acute recurrent maxillary sinusitis: Secondary | ICD-10-CM

## 2018-12-17 LAB — CBC WITH DIFFERENTIAL (CANCER CENTER ONLY)
Abs Immature Granulocytes: 0.17 10*3/uL — ABNORMAL HIGH (ref 0.00–0.07)
Basophils Absolute: 0 10*3/uL (ref 0.0–0.1)
Basophils Relative: 1 %
Eosinophils Absolute: 0.1 10*3/uL (ref 0.0–0.5)
Eosinophils Relative: 1 %
HCT: 39.2 % (ref 36.0–46.0)
Hemoglobin: 13.1 g/dL (ref 12.0–15.0)
Immature Granulocytes: 3 %
Lymphocytes Relative: 30 %
Lymphs Abs: 1.8 10*3/uL (ref 0.7–4.0)
MCH: 34.5 pg — ABNORMAL HIGH (ref 26.0–34.0)
MCHC: 33.4 g/dL (ref 30.0–36.0)
MCV: 103.2 fL — ABNORMAL HIGH (ref 80.0–100.0)
Monocytes Absolute: 0.4 10*3/uL (ref 0.1–1.0)
Monocytes Relative: 6 %
Neutro Abs: 3.6 10*3/uL (ref 1.7–7.7)
Neutrophils Relative %: 59 %
Platelet Count: 101 10*3/uL — ABNORMAL LOW (ref 150–400)
RBC: 3.8 MIL/uL — ABNORMAL LOW (ref 3.87–5.11)
RDW: 12.2 % (ref 11.5–15.5)
WBC Count: 6 10*3/uL (ref 4.0–10.5)
nRBC: 0 % (ref 0.0–0.2)

## 2018-12-17 LAB — CMP (CANCER CENTER ONLY)
ALT: 15 U/L (ref 0–44)
AST: 21 U/L (ref 15–41)
Albumin: 4.4 g/dL (ref 3.5–5.0)
Alkaline Phosphatase: 60 U/L (ref 38–126)
Anion gap: 7 (ref 5–15)
BUN: 15 mg/dL (ref 8–23)
CO2: 31 mmol/L (ref 22–32)
Calcium: 9.5 mg/dL (ref 8.9–10.3)
Chloride: 104 mmol/L (ref 98–111)
Creatinine: 0.84 mg/dL (ref 0.44–1.00)
GFR, Est AFR Am: >60 mL/min (ref >60)
GFR, Estimated: >60 mL/min (ref >60)
Glucose, Bld: 94 mg/dL (ref 70–99)
Potassium: 5.2 mmol/L — ABNORMAL HIGH (ref 3.5–5.1)
Sodium: 142 mmol/L (ref 135–145)
Total Bilirubin: 0.8 mg/dL (ref 0.3–1.2)
Total Protein: 6.6 g/dL (ref 6.5–8.1)

## 2018-12-17 MED ORDER — DIPHENHYDRAMINE HCL 25 MG PO CAPS
ORAL_CAPSULE | ORAL | Status: AC
  Filled 2018-12-17: qty 1

## 2018-12-17 MED ORDER — DIPHENHYDRAMINE HCL 25 MG PO CAPS
25.0000 mg | ORAL_CAPSULE | Freq: Once | ORAL | Status: AC
  Administered 2018-12-17: 10:00:00 25 mg via ORAL

## 2018-12-17 MED ORDER — IMMUNE GLOBULIN (HUMAN) 20 GM/200ML IV SOLN
40.0000 g | Freq: Once | INTRAVENOUS | Status: AC
  Administered 2018-12-17: 11:00:00 40 g via INTRAVENOUS
  Filled 2018-12-17: qty 400

## 2018-12-17 MED ORDER — DEXTROSE 5 % IV SOLN
Freq: Once | INTRAVENOUS | Status: AC
  Administered 2018-12-17: 10:00:00 via INTRAVENOUS
  Filled 2018-12-17: qty 250

## 2018-12-17 MED ORDER — ACETAMINOPHEN 325 MG PO TABS
650.0000 mg | ORAL_TABLET | Freq: Once | ORAL | Status: AC
  Administered 2018-12-17: 650 mg via ORAL

## 2018-12-17 NOTE — Progress Notes (Signed)
Hematology and Oncology Follow Up Visit  Kristy [name] [id number] [birth year] 63 y.o. 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  So far, everything is going pretty well for her.  She is working.  She has had no problems with work.  There is been no problems with fever.  She is had no issues with nausea or vomiting.  Her main problem has been her legs.  Looks like she has some stasis dermatitis issues in her legs.  This is a little unusual given her size.  She probably needs to see a vein/vascular specialist.  I will have to see about making a referral for her.  Her Waldenstrm's has not been all that bad.  Her last M spike was 0.3 g/dL.  Her IgM level was 200 mg/dL.  She is doing well with the Imbruvica.  Overall, her performance status is ECOG 0.     Medications:  Allergies as of 12/17/2018   No Known Allergies     Medication List       Accurate as of December 17, 2018  9:39 AM. If you have any questions, ask your nurse or doctor.        cyanocobalamin 1000 MCG/ML injection Commonly known as: (VITAMIN B-12) INJECT 1 ML INTO THE MUSCLE EVERY 2 MONTHS   fluconazole 200 MG tablet Commonly known as: DIFLUCAN Take 200 mg by mouth 2 (two) times a week. Take for 3 months   Imbruvica 420 MG Tabs Generic drug: Ibrutinib TAKE 1 TABLET BY MOUTH DAILY AFTER BREAKFAST.   Vitamin D3 1.25 MG (50000 UT) Tabs Take by mouth.   Xiidra 5 % Soln Generic drug: Lifitegrast Apply 1 drop to eye 2 (two) times daily.       Allergies: No Known Allergies  Past Medical History, Surgical history, Social history, and Family History were reviewed and updated.  Review of Systems: Review of Systems  Constitutional: Negative.   HENT: Negative.    Eyes: Negative.   Respiratory: Negative.   Cardiovascular: Negative.   Gastrointestinal: Negative.   Genitourinary: Negative.   Musculoskeletal: Negative.   Skin: Negative.   Neurological: Negative.   Endo/Heme/Allergies: Negative.   Psychiatric/Behavioral: The patient is nervous/anxious.       Physical Exam:  height is 5' (1.524 m) and weight is 118 lb 6.4 oz (53.7 kg). Her temporal temperature is 97.1 F (36.2 C) (abnormal). Her blood pressure is 146/72 (abnormal) and her pulse is 85. Her respiration is 18 and oxygen saturation is 99%.   Wt Readings from Last 3 Encounters:  12/17/18 118 lb 6.4 oz (53.7 kg)  11/12/18 118 lb (53.5 kg)  10/02/18 119 lb 6.4 oz (54.2 kg)    Physical Exam Vitals signs reviewed.  HENT:     Head: Normocephalic and atraumatic.  Eyes:     Pupils: Pupils are equal, round, and reactive to light.  Neck:     Musculoskeletal: Normal range of motion.  Cardiovascular:     Rate and Rhythm: Normal rate and regular rhythm.     Heart sounds: Normal heart sounds.  Pulmonary:     Effort: Pulmonary effort is normal.     Breath sounds: Normal breath sounds.  Abdominal:     General: Bowel sounds are normal.     Palpations: Abdomen is soft.  Musculoskeletal: Normal range of motion.        General: No tenderness or deformity.  Lymphadenopathy:     Cervical: No cervical adenopathy.  Skin:    General: Skin is warm and dry.     Findings: No erythema or rash.  Neurological:     Mental Status: She is alert and oriented to person, place, and time.  Psychiatric:        Behavior: Behavior normal.        Thought Content: Thought content normal.        Judgment: Judgment normal.      Lab Results  Component Value Date   WBC 6.0 12/17/2018   HGB 13.1 12/17/2018   HCT 39.2 12/17/2018   MCV 103.2 (H) 12/17/2018   PLT 101 (L) 12/17/2018   No results found for: FERRITIN, IRON, TIBC, UIBC, IRONPCTSAT Lab Results  Component Value Date   RETICCTPCT 2.9 (H)  10/30/2014   RBC 3.80 (L) 12/17/2018   RETICCTABS 98.3 10/30/2014   Lab Results  Component Value Date   KPAFRELGTCHN 19.7 (H) 11/12/2018   LAMBDASER 3.4 (L) 11/12/2018   KAPLAMBRATIO 5.79 (H) 11/12/2018   Lab Results  Component Value Date   IGGSERUM 598 11/12/2018   IGA 12 (L) 11/12/2018   IGMSERUM 203 11/12/2018   Lab Results  Component Value Date   TOTALPROTELP 5.8 (L) 11/12/2018   ALBUMINELP 3.5 11/12/2018   A1GS 0.2 11/12/2018   A2GS 0.5 11/12/2018   BETS 0.9 11/12/2018   BETA2SER 0.2 02/15/2015   GAMS 0.7 11/12/2018   MSPIKE 0.3 (H) 11/12/2018   SPEI Comment 11/12/2018     Chemistry      Component Value Date/Time   NA 142 12/17/2018 0823   NA 147 (H) 03/19/2017 0849   NA 139 03/16/2016 1337   K 5.2 (H) 12/17/2018 0823   K 4.5 03/19/2017 0849   K 3.8 03/16/2016 1337   CL 104 12/17/2018 0823   CL 108 03/19/2017 0849   CO2 31 12/17/2018 0823   CO2 31 03/19/2017 0849   CO2 27 03/16/2016 1337   BUN 15 12/17/2018 0823   BUN 11 03/19/2017 0849   BUN 5.2 (L) 03/16/2016 1337   CREATININE 0.84 12/17/2018 0823   CREATININE 1.0 03/19/2017 0849   CREATININE 0.7 03/16/2016 1337      Component Value Date/Time   CALCIUM 9.5 12/17/2018 0823   CALCIUM 9.6 03/19/2017 0849   CALCIUM 8.8 03/16/2016 1337   ALKPHOS 60 12/17/2018 0823   ALKPHOS 53 03/19/2017 0849   ALKPHOS 62 03/16/2016 1337   AST 21 12/17/2018 0823   AST 24 03/16/2016 1337   ALT 15 12/17/2018 0823   ALT 23 03/19/2017 0849   ALT 19 03/16/2016 1337   BILITOT 0.8 12/17/2018 0823   BILITOT 0.57 03/16/2016 1337       Impression and Plan: Kristy [name] is a very pleasant 63 yo caucasian female with Waldenstrom's with acquired hypogammaglobulinemia and recurrent sinusitis.   We will proceed with IVIG today as planned.   Her studies for the Waldenstrom's look great.  Again, [name] is doing quite nicely for her.  We will continue to monitor her every 6 weeks.  We will see about the referral to  vein and vascular specialist.   [name], MD 9/29/20209:39 AM

## 2018-12-17 NOTE — Progress Notes (Signed)
Patient does not want to stay for the 30 minute post IVIG observation. Patient VSS. Patient discharged ambulatory without complaints or concerns.

## 2018-12-18 LAB — KAPPA/LAMBDA LIGHT CHAINS
Kappa free light chain: 18.9 mg/L (ref 3.3–19.4)
Kappa, lambda light chain ratio: 7.27 — ABNORMAL HIGH (ref 0.26–1.65)
Lambda free light chains: 2.6 mg/L — ABNORMAL LOW (ref 5.7–26.3)

## 2018-12-18 LAB — IGG, IGA, IGM
IgA: 12 mg/dL — ABNORMAL LOW (ref 87–352)
IgG (Immunoglobin G), Serum: 670 mg/dL (ref 586–1602)
IgM (Immunoglobulin M), Srm: 198 mg/dL (ref 26–217)

## 2018-12-20 LAB — IMMUNOFIXATION REFLEX, SERUM
IgA: 13 mg/dL — ABNORMAL LOW (ref 87–352)
IgG (Immunoglobin G), Serum: 706 mg/dL (ref 586–1602)
IgM (Immunoglobulin M), Srm: 212 mg/dL (ref 26–217)

## 2018-12-20 LAB — PROTEIN ELECTROPHORESIS, SERUM, WITH REFLEX
A/G Ratio: 1.6 (ref 0.7–1.7)
Albumin ELP: 3.9 g/dL (ref 2.9–4.4)
Alpha-1-Globulin: 0.2 g/dL (ref 0.0–0.4)
Alpha-2-Globulin: 0.7 g/dL (ref 0.4–1.0)
Beta Globulin: 0.8 g/dL (ref 0.7–1.3)
Gamma Globulin: 0.7 g/dL (ref 0.4–1.8)
Globulin, Total: 2.4 g/dL (ref 2.2–3.9)
M-Spike, %: 0.2 g/dL — ABNORMAL HIGH
SPEP Interpretation: 0
Total Protein ELP: 6.3 g/dL (ref 6.0–8.5)

## 2019-01-06 MED FILL — IMBRUVICA 420 MG TAB: 420 | 28 days supply | Qty: 28 | Fill #8

## 2019-01-06 MED FILL — CYANOCOBALAMIN 1,000 MCG/ML: 1000 | 60 days supply | Qty: 1 | Fill #3

## 2019-01-28 ENCOUNTER — Inpatient Hospital Stay: Payer: Federal, State, Local not specified - PPO

## 2019-01-28 ENCOUNTER — Encounter: Payer: Self-pay | Admitting: Hematology & Oncology

## 2019-01-28 ENCOUNTER — Other Ambulatory Visit: Payer: Self-pay

## 2019-01-28 ENCOUNTER — Inpatient Hospital Stay: Payer: Federal, State, Local not specified - PPO | Attending: Hematology & Oncology | Admitting: Hematology & Oncology

## 2019-01-28 VITALS — BP 128/64 | HR 77 | Temp 97.3°F | Resp 16

## 2019-01-28 VITALS — BP 134/67 | HR 85 | Temp 97.1°F | Resp 18 | Wt 116.0 lb

## 2019-01-28 DIAGNOSIS — Z79899 Other long term (current) drug therapy: Secondary | ICD-10-CM | POA: Insufficient documentation

## 2019-01-28 DIAGNOSIS — I872 Venous insufficiency (chronic) (peripheral): Secondary | ICD-10-CM | POA: Insufficient documentation

## 2019-01-28 DIAGNOSIS — C880 Waldenstrom macroglobulinemia: Secondary | ICD-10-CM | POA: Diagnosis not present

## 2019-01-28 DIAGNOSIS — J329 Chronic sinusitis, unspecified: Secondary | ICD-10-CM | POA: Insufficient documentation

## 2019-01-28 DIAGNOSIS — D801 Nonfamilial hypogammaglobulinemia: Secondary | ICD-10-CM | POA: Diagnosis not present

## 2019-01-28 DIAGNOSIS — J0101 Acute recurrent maxillary sinusitis: Secondary | ICD-10-CM

## 2019-01-28 LAB — CBC WITH DIFFERENTIAL (CANCER CENTER ONLY)
Abs Immature Granulocytes: 0.14 10*3/uL — ABNORMAL HIGH (ref 0.00–0.07)
Basophils Absolute: 0.1 10*3/uL (ref 0.0–0.1)
Basophils Relative: 1 %
Eosinophils Absolute: 0.1 10*3/uL (ref 0.0–0.5)
Eosinophils Relative: 1 %
HCT: 41.7 % (ref 36.0–46.0)
Hemoglobin: 13.7 g/dL (ref 12.0–15.0)
Immature Granulocytes: 1 %
Lymphocytes Relative: 26 %
Lymphs Abs: 2.6 10*3/uL (ref 0.7–4.0)
MCH: 34.1 pg — ABNORMAL HIGH (ref 26.0–34.0)
MCHC: 32.9 g/dL (ref 30.0–36.0)
MCV: 103.7 fL — ABNORMAL HIGH (ref 80.0–100.0)
Monocytes Absolute: 0.7 10*3/uL (ref 0.1–1.0)
Monocytes Relative: 7 %
Neutro Abs: 6.2 10*3/uL (ref 1.7–7.7)
Neutrophils Relative %: 64 %
Platelet Count: 132 10*3/uL — ABNORMAL LOW (ref 150–400)
RBC: 4.02 MIL/uL (ref 3.87–5.11)
RDW: 12.4 % (ref 11.5–15.5)
WBC Count: 9.7 10*3/uL (ref 4.0–10.5)
nRBC: 0 % (ref 0.0–0.2)

## 2019-01-28 LAB — CMP (CANCER CENTER ONLY)
ALT: 16 U/L (ref 0–44)
AST: 22 U/L (ref 15–41)
Albumin: 4.7 g/dL (ref 3.5–5.0)
Alkaline Phosphatase: 54 U/L (ref 38–126)
Anion gap: 7 (ref 5–15)
BUN: 16 mg/dL (ref 8–23)
CO2: 30 mmol/L (ref 22–32)
Calcium: 9.2 mg/dL (ref 8.9–10.3)
Chloride: 103 mmol/L (ref 98–111)
Creatinine: 0.87 mg/dL (ref 0.44–1.00)
GFR, Est AFR Am: >60 mL/min (ref >60)
GFR, Estimated: >60 mL/min (ref >60)
Glucose, Bld: 97 mg/dL (ref 70–99)
Potassium: 5.3 mmol/L — ABNORMAL HIGH (ref 3.5–5.1)
Sodium: 140 mmol/L (ref 135–145)
Total Bilirubin: 1.1 mg/dL (ref 0.3–1.2)
Total Protein: 6.5 g/dL (ref 6.5–8.1)

## 2019-01-28 MED ORDER — ACETAMINOPHEN 325 MG PO TABS
ORAL_TABLET | ORAL | Status: AC
  Filled 2019-01-28: qty 2

## 2019-01-28 MED ORDER — DIPHENHYDRAMINE HCL 25 MG PO CAPS
25.0000 mg | ORAL_CAPSULE | Freq: Once | ORAL | Status: AC
  Administered 2019-01-28: 25 mg via ORAL

## 2019-01-28 MED ORDER — DEXTROSE 5 % IV SOLN
INTRAVENOUS | Status: DC
  Administered 2019-01-28: 09:00:00 via INTRAVENOUS
  Filled 2019-01-28: qty 250

## 2019-01-28 MED ORDER — DIPHENHYDRAMINE HCL 25 MG PO CAPS
ORAL_CAPSULE | ORAL | Status: AC
  Filled 2019-01-28: qty 1

## 2019-01-28 MED ORDER — ACETAMINOPHEN 325 MG PO TABS
650.0000 mg | ORAL_TABLET | Freq: Once | ORAL | Status: AC
  Administered 2019-01-28: 650 mg via ORAL

## 2019-01-28 MED ORDER — IMMUNE GLOBULIN (HUMAN) 20 GM/200ML IV SOLN
40.0000 g | Freq: Once | INTRAVENOUS | Status: AC
  Administered 2019-01-28: 40 g via INTRAVENOUS
  Filled 2019-01-28: qty 400

## 2019-01-28 NOTE — Progress Notes (Signed)
Hematology and Oncology Follow Up Visit  Kristy [name] [id number] Mar 02, 1956 63 y.o. 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  So far, everything is going pretty well for Kristy.  She is working.  She has had no problems with work.  There is been no problems with fever.  She is had no issues with nausea or vomiting.  Kristy main problem has been Kristy legs.  Looks like she has some stasis dermatitis issues in Kristy legs.  This is a little unusual given Kristy size.  She probably needs to see a vein/vascular specialist.  I will have to see about making a referral for Kristy.  Kristy Waldenstrm's has not been all that bad.  Kristy last M spike was 0.2 g/dL.  Kristy IgM level was 212mg /dL.  She is doing well with the Imbruvica.  Overall, Kristy performance status is ECOG 0.     Medications:  Allergies as of 01/28/2019   No Known Allergies     Medication List       Accurate as of January 28, 2019  8:45 AM. If you have any questions, ask your nurse or doctor.        cyanocobalamin 1000 MCG/ML injection Commonly known as: (VITAMIN B-12) INJECT 1 ML INTO THE MUSCLE EVERY 2 MONTHS   fluconazole 200 MG tablet Commonly known as: DIFLUCAN Take 200 mg by mouth 2 (two) times a week. Take for 3 months   Imbruvica 420 MG Tabs Generic drug: Ibrutinib TAKE 1 TABLET BY MOUTH DAILY AFTER BREAKFAST.   Vitamin D3 1.25 MG (50000 UT) Tabs Take by mouth.   Xiidra 5 % Soln Generic drug: Lifitegrast Apply 1 drop to eye 2 (two) times daily.       Allergies: No Known Allergies  Past Medical History, Surgical history, Social history, and Family History were reviewed and updated.  Review of Systems: Review of Systems  Constitutional: Negative.   HENT: Negative.    Eyes: Negative.   Respiratory: Negative.   Cardiovascular: Negative.   Gastrointestinal: Negative.   Genitourinary: Negative.   Musculoskeletal: Negative.   Skin: Negative.   Neurological: Negative.   Endo/Heme/Allergies: Negative.   Psychiatric/Behavioral: The patient is nervous/anxious.       Physical Exam:  vitals were not taken for this visit.   Wt Readings from Last 3 Encounters:  12/17/18 118 lb 6.4 oz (53.7 kg)  11/12/18 118 lb (53.5 kg)  10/02/18 119 lb 6.4 oz (54.2 kg)    Physical Exam Vitals signs reviewed.  HENT:     Head: Normocephalic and atraumatic.  Eyes:     Pupils: Pupils are equal, round, and reactive to light.  Neck:     Musculoskeletal: Normal range of motion.  Cardiovascular:     Rate and Rhythm: Normal rate and regular rhythm.     Heart sounds: Normal heart sounds.  Pulmonary:     Effort: Pulmonary effort is normal.     Breath sounds: Normal breath sounds.  Abdominal:     General: Bowel sounds are normal.     Palpations: Abdomen is soft.  Musculoskeletal: Normal range of motion.        General: No tenderness or deformity.  Lymphadenopathy:     Cervical: No cervical adenopathy.  Skin:    General: Skin is warm  and dry.     Findings: No erythema or rash.  Neurological:     Mental Status: She is alert and oriented to person, place, and time.  Psychiatric:        Behavior: Behavior normal.        Thought Content: Thought content normal.        Judgment: Judgment normal.      Lab Results  Component Value Date   WBC 9.7 01/28/2019   HGB 13.7 01/28/2019   HCT 41.7 01/28/2019   MCV 103.7 (H) 01/28/2019   PLT 132 (L) 01/28/2019   No results found for: FERRITIN, IRON, TIBC, UIBC, IRONPCTSAT Lab Results  Component Value Date   RETICCTPCT 2.9 (H) 10/30/2014   RBC 4.02 01/28/2019   RETICCTABS 98.3 10/30/2014   Lab Results  Component Value Date   KPAFRELGTCHN 18.9 12/17/2018   LAMBDASER 2.6 (L) 12/17/2018   KAPLAMBRATIO 7.27 (H)  12/17/2018   Lab Results  Component Value Date   IGGSERUM 706 12/17/2018   IGA 13 (L) 12/17/2018   IGMSERUM 212 12/17/2018   Lab Results  Component Value Date   TOTALPROTELP 6.3 12/17/2018   ALBUMINELP 3.9 12/17/2018   A1GS 0.2 12/17/2018   A2GS 0.7 12/17/2018   BETS 0.8 12/17/2018   BETA2SER 0.2 02/15/2015   GAMS 0.7 12/17/2018   MSPIKE 0.2 (H) 12/17/2018   SPEI Comment 11/12/2018     Chemistry      Component Value Date/Time   NA 140 01/28/2019 0759   NA 147 (H) 03/19/2017 0849   NA 139 03/16/2016 1337   K 5.3 (H) 01/28/2019 0759   K 4.5 03/19/2017 0849   K 3.8 03/16/2016 1337   CL 103 01/28/2019 0759   CL 108 03/19/2017 0849   CO2 30 01/28/2019 0759   CO2 31 03/19/2017 0849   CO2 27 03/16/2016 1337   BUN 16 01/28/2019 0759   BUN 11 03/19/2017 0849   BUN 5.2 (L) 03/16/2016 1337   CREATININE 0.87 01/28/2019 0759   CREATININE 1.0 03/19/2017 0849   CREATININE 0.7 03/16/2016 1337      Component Value Date/Time   CALCIUM 9.2 01/28/2019 0759   CALCIUM 9.6 03/19/2017 0849   CALCIUM 8.8 03/16/2016 1337   ALKPHOS 54 01/28/2019 0759   ALKPHOS 53 03/19/2017 0849   ALKPHOS 62 03/16/2016 1337   AST 22 01/28/2019 0759   AST 24 03/16/2016 1337   ALT 16 01/28/2019 0759   ALT 23 03/19/2017 0849   ALT 19 03/16/2016 1337   BILITOT 1.1 01/28/2019 0759   BILITOT 0.57 03/16/2016 1337       Impression and Plan: Kristy [name] is a very pleasant 63 yo caucasian female with Waldenstrom's with acquired hypogammaglobulinemia and recurrent sinusitis.   We will proceed with IVIG today as planned.   Kristy studies for the Waldenstrom's look great.  Again, [name] is doing quite nicely for Kristy.  We will continue to monitor Kristy every 6 weeks.  Kristy Napoleon, MD 11/10/20208:45 AM

## 2019-01-28 NOTE — Patient Instructions (Signed)
Immune Globulin Injection What is this medicine? IMMUNE GLOBULIN (im MUNE GLOB yoo lin) helps to prevent or reduce the severity of certain infections in patients who are at risk. This medicine is collected from the pooled blood of many donors. It is used to treat immune system problems, thrombocytopenia, and Kawasaki syndrome. This medicine may be used for other purposes; ask your health care provider or pharmacist if you have questions. COMMON BRAND NAME(S): ASCENIV, Baygam, BIVIGAM, Carimune, Carimune NF, cutaquig, Cuvitru, Flebogamma, Flebogamma DIF, GamaSTAN, GamaSTAN S/D, Gamimune N, Gammagard, Gammagard S/D, Gammaked, Gammaplex, Gammar-P IV, Gamunex, Gamunex-C, Hizentra, Iveegam, Iveegam EN, Octagam, Panglobulin, Panglobulin NF, panzyga, Polygam S/D, Privigen, Sandoglobulin, Venoglobulin-S, Vigam, Vivaglobulin, Xembify What should I tell my health care provider before I take this medicine? They need to know if you have any of these conditions:   diabetes   extremely low or no immune antibodies in the blood   heart disease   history of blood clots   hyperprolinemia   infection in the blood, sepsis   kidney disease   taking medicine that may change kidney function - ask your health care provider about your medicine   an unusual or allergic reaction to human immune globulin, albumin, maltose, sucrose, polysorbate 80, other medicines, foods, dyes, or preservatives   pregnant or trying to get pregnant   breast-feeding How should I use this medicine? This medicine is for injection into a muscle or infusion into a vein or skin. It is usually given by a health care professional in a hospital or clinic setting. In rare cases, some brands of this medicine might be given at home. You will be taught how to give this medicine. Use exactly as directed. Take your medicine at regular intervals. Do not take your medicine more often than directed. Talk to your pediatrician regarding  the use of this medicine in children. Special care may be needed. Overdosage: If you think you have taken too much of this medicine contact a poison control center or emergency room at once. NOTE: This medicine is only for you. Do not share this medicine with others. What if I miss a dose? It is important not to miss your dose. Call your doctor or health care professional if you are unable to keep an appointment. If you give yourself the medicine and you miss a dose, take it as soon as you can. If it is almost time for your next dose, take only that dose. Do not take double or extra doses. What may interact with this medicine?  aspirin and aspirin-like medicines  cisplatin  cyclosporine  medicines for infection like acyclovir, adefovir, amphotericin B, bacitracin, cidofovir, foscarnet, ganciclovir, gentamicin, pentamidine, vancomycin  NSAIDS, medicines for pain and inflammation, like ibuprofen or naproxen  pamidronate  vaccines  zoledronic acid This list may not describe all possible interactions. Give your health care provider a list of all the medicines, herbs, non-prescription drugs, or dietary supplements you use. Also tell them if you smoke, drink alcohol, or use illegal drugs. Some items may interact with your medicine. What should I watch for while using this medicine? Your condition will be monitored carefully while you are receiving this medicine. This medicine is made from pooled blood donations of many different people. It may be possible to pass an infection in this medicine. However, the donors are screened for infections and all products are tested for HIV and hepatitis. The medicine is treated to kill most or all bacteria and viruses. Talk to your doctor about   the risks and benefits of this medicine. Do not have vaccinations for at least 14 days before, or until at least 3 months after receiving this medicine. What side effects may I notice from receiving this  medicine? Side effects that you should report to your doctor or health care professional as soon as possible:  allergic reactions like skin rash, itching or hives, swelling of the face, lips, or tongue  breathing problems  chest pain or tightness  fever, chills  headache with nausea, vomiting  neck pain or difficulty moving neck  pain when moving eyes  pain, swelling, warmth in the leg  problems with balance, talking, walking  sudden weight gain  swelling of the ankles, feet, hands  trouble passing urine or change in the amount of urine Side effects that usually do not require medical attention (report to your doctor or health care professional if they continue or are bothersome):  dizzy, drowsy  flushing  increased sweating  leg cramps  muscle aches and pains  pain at site where injected This list may not describe all possible side effects. Call your doctor for medical advice about side effects. You may report side effects to FDA at 1-800-FDA-1088. Where should I keep my medicine? Keep out of the reach of children. This drug is usually given in a hospital or clinic and will not be stored at home. In rare cases, some brands of this medicine may be given at home. If you are using this medicine at home, you will be instructed on how to store this medicine. Throw away any unused medicine after the expiration date on the label. NOTE: This sheet is a summary. It may not cover all possible information. If you have questions about this medicine, talk to your doctor, pharmacist, or health care provider.  2020 Elsevier/Gold Standard (2008-05-27 11:44:49)  

## 2019-01-29 LAB — KAPPA/LAMBDA LIGHT CHAINS
Kappa free light chain: 19.8 mg/L — ABNORMAL HIGH (ref 3.3–19.4)
Kappa, lambda light chain ratio: 7.62 — ABNORMAL HIGH (ref 0.26–1.65)
Lambda free light chains: 2.6 mg/L — ABNORMAL LOW (ref 5.7–26.3)

## 2019-01-29 LAB — IGG, IGA, IGM
IgA: 13 mg/dL — ABNORMAL LOW (ref 87–352)
IgG (Immunoglobin G), Serum: 650 mg/dL (ref 586–1602)
IgM (Immunoglobulin M), Srm: 201 mg/dL (ref 26–217)

## 2019-01-31 LAB — IMMUNOFIXATION REFLEX, SERUM
IgA: 13 mg/dL — ABNORMAL LOW (ref 87–352)
IgG (Immunoglobin G), Serum: 646 mg/dL (ref 586–1602)
IgM (Immunoglobulin M), Srm: 205 mg/dL (ref 26–217)

## 2019-01-31 LAB — PROTEIN ELECTROPHORESIS, SERUM, WITH REFLEX
A/G Ratio: 1.7 (ref 0.7–1.7)
Albumin ELP: 4.2 g/dL (ref 2.9–4.4)
Alpha-1-Globulin: 0.2 g/dL (ref 0.0–0.4)
Alpha-2-Globulin: 0.6 g/dL (ref 0.4–1.0)
Beta Globulin: 1 g/dL (ref 0.7–1.3)
Gamma Globulin: 0.7 g/dL (ref 0.4–1.8)
Globulin, Total: 2.5 g/dL (ref 2.2–3.9)
M-Spike, %: 0.2 g/dL — ABNORMAL HIGH
SPEP Interpretation: 0
Total Protein ELP: 6.7 g/dL (ref 6.0–8.5)

## 2019-02-06 MED FILL — IMBRUVICA 420 MG TAB: 420 | 28 days supply | Qty: 28 | Fill #9

## 2019-03-03 MED FILL — CYANOCOBALAMIN 1,000 MCG/ML: 1000 | 60 days supply | Qty: 1 | Fill #4

## 2019-03-03 MED FILL — IMBRUVICA 420 MG TAB: 420 | 28 days supply | Qty: 28 | Fill #10

## 2019-03-04 ENCOUNTER — Telehealth: Payer: Self-pay | Admitting: Pharmacy Technician

## 2019-03-04 NOTE — Telephone Encounter (Signed)
Oral Oncology Patient Advocate Encounter  Received notification from [facility] FEP that prior authorization for Imbruvica is required.  PA submitted on CoverMyMeds Key [id number] Status is pending  Oral Oncology Clinic will continue to follow.  [place] Patient [place] Phone [phone] Fax [phone] 03/04/2019 8:42 AM

## 2019-03-04 NOTE — Telephone Encounter (Signed)
Oral Oncology Patient Advocate Encounter  Prior Authorization for Kristy [name] has been approved.    PA# [id number] Effective dates: 02/01/2019 through 03/02/2020  Oral Oncology Clinic will continue to follow.    [place] Patient [place] Phone [phone] Fax [phone] 03/04/2019 8:44 AM

## 2019-03-05 ENCOUNTER — Inpatient Hospital Stay: Payer: Federal, State, Local not specified - PPO

## 2019-03-05 ENCOUNTER — Telehealth: Payer: Self-pay | Admitting: Hematology & Oncology

## 2019-03-05 ENCOUNTER — Other Ambulatory Visit: Payer: Self-pay

## 2019-03-05 ENCOUNTER — Inpatient Hospital Stay: Payer: Federal, State, Local not specified - PPO | Attending: Hematology & Oncology | Admitting: Hematology & Oncology

## 2019-03-05 ENCOUNTER — Encounter: Payer: Self-pay | Admitting: Hematology & Oncology

## 2019-03-05 VITALS — BP 135/82 | HR 87 | Temp 97.1°F | Resp 20 | Wt 116.1 lb

## 2019-03-05 VITALS — BP 125/69 | HR 80 | Temp 97.7°F | Resp 17

## 2019-03-05 DIAGNOSIS — D801 Nonfamilial hypogammaglobulinemia: Secondary | ICD-10-CM | POA: Insufficient documentation

## 2019-03-05 DIAGNOSIS — J0101 Acute recurrent maxillary sinusitis: Secondary | ICD-10-CM

## 2019-03-05 DIAGNOSIS — I872 Venous insufficiency (chronic) (peripheral): Secondary | ICD-10-CM | POA: Insufficient documentation

## 2019-03-05 DIAGNOSIS — C880 Waldenstrom macroglobulinemia: Secondary | ICD-10-CM

## 2019-03-05 DIAGNOSIS — Z79899 Other long term (current) drug therapy: Secondary | ICD-10-CM | POA: Insufficient documentation

## 2019-03-05 DIAGNOSIS — J329 Chronic sinusitis, unspecified: Secondary | ICD-10-CM | POA: Insufficient documentation

## 2019-03-05 LAB — CBC WITH DIFFERENTIAL (CANCER CENTER ONLY)
Abs Immature Granulocytes: 0.07 10*3/uL (ref 0.00–0.07)
Basophils Absolute: 0 10*3/uL (ref 0.0–0.1)
Basophils Relative: 0 %
Eosinophils Absolute: 0.1 10*3/uL (ref 0.0–0.5)
Eosinophils Relative: 1 %
HCT: 37.6 % (ref 36.0–46.0)
Hemoglobin: 12.8 g/dL (ref 12.0–15.0)
Immature Granulocytes: 1 %
Lymphocytes Relative: 29 %
Lymphs Abs: 2 10*3/uL (ref 0.7–4.0)
MCH: 34.8 pg — ABNORMAL HIGH (ref 26.0–34.0)
MCHC: 34 g/dL (ref 30.0–36.0)
MCV: 102.2 fL — ABNORMAL HIGH (ref 80.0–100.0)
Monocytes Absolute: 0.5 10*3/uL (ref 0.1–1.0)
Monocytes Relative: 7 %
Neutro Abs: 4.3 10*3/uL (ref 1.7–7.7)
Neutrophils Relative %: 62 %
Platelet Count: 103 10*3/uL — ABNORMAL LOW (ref 150–400)
RBC: 3.68 MIL/uL — ABNORMAL LOW (ref 3.87–5.11)
RDW: 12.4 % (ref 11.5–15.5)
WBC Count: 7 10*3/uL (ref 4.0–10.5)
nRBC: 0 % (ref 0.0–0.2)

## 2019-03-05 LAB — CMP (CANCER CENTER ONLY)
ALT: 14 U/L (ref 0–44)
AST: 20 U/L (ref 15–41)
Albumin: 4.4 g/dL (ref 3.5–5.0)
Alkaline Phosphatase: 57 U/L (ref 38–126)
Anion gap: 8 (ref 5–15)
BUN: 13 mg/dL (ref 8–23)
CO2: 26 mmol/L (ref 22–32)
Calcium: 8.9 mg/dL (ref 8.9–10.3)
Chloride: 106 mmol/L (ref 98–111)
Creatinine: 0.74 mg/dL (ref 0.44–1.00)
GFR, Est AFR Am: >60 mL/min (ref >60)
GFR, Estimated: >60 mL/min (ref >60)
Glucose, Bld: 86 mg/dL (ref 70–99)
Potassium: 4 mmol/L (ref 3.5–5.1)
Sodium: 140 mmol/L (ref 135–145)
Total Bilirubin: 1 mg/dL (ref 0.3–1.2)
Total Protein: 6.3 g/dL — ABNORMAL LOW (ref 6.5–8.1)

## 2019-03-05 MED ORDER — ACETAMINOPHEN 325 MG PO TABS
650.0000 mg | ORAL_TABLET | Freq: Once | ORAL | Status: AC
  Administered 2019-03-05: 650 mg via ORAL

## 2019-03-05 MED ORDER — DIPHENHYDRAMINE HCL 25 MG PO CAPS
ORAL_CAPSULE | ORAL | Status: AC
  Filled 2019-03-05: qty 1

## 2019-03-05 MED ORDER — ACETAMINOPHEN 325 MG PO TABS
ORAL_TABLET | ORAL | Status: AC
  Filled 2019-03-05: qty 2

## 2019-03-05 MED ORDER — DIPHENHYDRAMINE HCL 25 MG PO CAPS
25.0000 mg | ORAL_CAPSULE | Freq: Once | ORAL | Status: AC
  Administered 2019-03-05: 25 mg via ORAL

## 2019-03-05 MED ORDER — DEXTROSE 5 % IV SOLN
INTRAVENOUS | Status: DC
  Filled 2019-03-05: qty 250

## 2019-03-05 MED ORDER — IMMUNE GLOBULIN (HUMAN) 20 GM/200ML IV SOLN
40.0000 g | Freq: Once | INTRAVENOUS | Status: AC
  Administered 2019-03-05: 40 g via INTRAVENOUS
  Filled 2019-03-05: qty 400

## 2019-03-05 NOTE — Progress Notes (Signed)
Hematology and Oncology Follow Up Visit  SOLI ORAVETZ [id number] 07/11/1955 63 y.o. 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  So far, everything is going pretty well for her.  She is working.  She has had no problems with work.  She had a very nice Thanksgiving.  She is looking forward to Christmas.  She has done quite well with the Imbruvica.  Her Waldenstrom's level really has been incredibly low.  When we last saw her, her Waldenstrom's level was 205 mg/dL.  Her M spike was 0.2 g/dL.    There is been no problems with fever.  She is had no issues with nausea or vomiting.  She has had no change in bowel or bladder habits.  Last time we saw her, she was having problems with some stasis dermatitis with her legs.  This might be a little bit better.  She has had no cough.  There is been no fever.  She has been very proactive in avoiding the coronavirus.  Overall, her performance status is ECOG 0.     Medications:  Allergies as of 03/05/2019   No Known Allergies     Medication List       Accurate as of March 05, 2019  9:29 AM. If you have any questions, ask your nurse or doctor.        STOP taking these medications   fluconazole 200 MG tablet Commonly known as: DIFLUCAN Stopped by: [name], MD     TAKE these medications   cyanocobalamin 1000 MCG/ML injection Commonly known as: (VITAMIN B-12) INJECT 1 ML INTO THE MUSCLE EVERY 2 MONTHS   Imbruvica 420 MG Tabs Generic drug: Ibrutinib TAKE 1 TABLET BY MOUTH DAILY AFTER BREAKFAST.   Vitamin D3 25 MCG tablet Commonly known as: Vitamin D Take by mouth daily.   Xiidra 5 % Soln Generic drug: Lifitegrast Apply 1 drop to eye 2 (two) times daily.       Allergies: No  Known Allergies  Past Medical History, Surgical history, Social history, and Family History were reviewed and updated.  Review of Systems: Review of Systems  Constitutional: Negative.   HENT: Negative.   Eyes: Negative.   Respiratory: Negative.   Cardiovascular: Negative.   Gastrointestinal: Negative.   Genitourinary: Negative.   Musculoskeletal: Negative.   Skin: Negative.   Neurological: Negative.   Endo/Heme/Allergies: Negative.   Psychiatric/Behavioral: The patient is nervous/anxious.       Physical Exam:  weight is 116 lb 1.3 oz (52.7 kg). Her temporal temperature is 97.1 F (36.2 C) (abnormal). Her blood pressure is 135/82 and her pulse is 87. Her respiration is 20 and oxygen saturation is 96%.   Wt Readings from Last 3 Encounters:  03/05/19 116 lb 1.3 oz (52.7 kg)  01/28/19 116 lb (52.6 kg)  12/17/18 118 lb 6.4 oz (53.7 kg)    Physical Exam Vitals reviewed.  HENT:     Head: Normocephalic and atraumatic.  Eyes:     Pupils: Pupils are equal, round, and reactive to light.  Cardiovascular:     Rate and Rhythm: Normal rate and regular rhythm.     Heart sounds: Normal heart sounds.  Pulmonary:     Effort: Pulmonary effort is normal.     Breath sounds: Normal breath sounds.  Abdominal:     General: Bowel sounds are normal.     Palpations: Abdomen is soft.  Musculoskeletal:        General: No tenderness or deformity. Normal range of motion.     Cervical back: Normal range of motion.  Lymphadenopathy:     Cervical: No cervical adenopathy.  Skin:    General: Skin is warm and dry.     Findings: No erythema or rash.  Neurological:     Mental Status: She is alert and oriented to person, place, and time.  Psychiatric:        Behavior: Behavior normal.        Thought Content: Thought content normal.        Judgment: Judgment normal.      Lab Results  Component Value Date   WBC 7.0 03/05/2019   HGB 12.8 03/05/2019   HCT 37.6 03/05/2019   MCV 102.2 (H)  03/05/2019   PLT 103 (L) 03/05/2019   No results found for: FERRITIN, IRON, TIBC, UIBC, IRONPCTSAT Lab Results  Component Value Date   RETICCTPCT 2.9 (H) 10/30/2014   RBC 3.68 (L) 03/05/2019   RETICCTABS 98.3 10/30/2014   Lab Results  Component Value Date   KPAFRELGTCHN 19.8 (H) 01/28/2019   LAMBDASER 2.6 (L) 01/28/2019   KAPLAMBRATIO 7.62 (H) 01/28/2019   Lab Results  Component Value Date   IGGSERUM 646 01/28/2019   IGA 13 (L) 01/28/2019   IGMSERUM 205 01/28/2019   Lab Results  Component Value Date   TOTALPROTELP 6.7 01/28/2019   ALBUMINELP 4.2 01/28/2019   A1GS 0.2 01/28/2019   A2GS 0.6 01/28/2019   BETS 1.0 01/28/2019   BETA2SER 0.2 02/15/2015   GAMS 0.7 01/28/2019   MSPIKE 0.2 (H) 01/28/2019   SPEI Comment 11/12/2018     Chemistry      Component Value Date/Time   NA 140 03/05/2019 0828   NA 147 (H) 03/19/2017 0849   NA 139 03/16/2016 1337   K 4.0 03/05/2019 0828   K 4.5 03/19/2017 0849   K 3.8 03/16/2016 1337   CL 106 03/05/2019 0828   CL 108 03/19/2017 0849   CO2 26 03/05/2019 0828   CO2 31 03/19/2017 0849   CO2 27 03/16/2016 1337   BUN 13 03/05/2019 0828   BUN 11 03/19/2017 0849   BUN 5.2 (L) 03/16/2016 1337   CREATININE 0.74 03/05/2019 0828   CREATININE 1.0 03/19/2017 0849   CREATININE 0.7 03/16/2016 1337      Component Value Date/Time   CALCIUM 8.9 03/05/2019 0828   CALCIUM 9.6 03/19/2017 0849   CALCIUM 8.8 03/16/2016 1337   ALKPHOS 57 03/05/2019 0828   ALKPHOS 53 03/19/2017 0849   ALKPHOS 62 03/16/2016 1337   AST 20 03/05/2019 0828   AST 24 03/16/2016 1337   ALT 14 03/05/2019 0828   ALT 23 03/19/2017 0849   ALT 19 03/16/2016 1337   BILITOT 1.0 03/05/2019 0828   BILITOT 0.57 03/16/2016 1337       Impression and Plan: Kristy [name] is a very pleasant 63 yo caucasian female with Waldenstrom's with acquired hypogammaglobulinemia and recurrent sinusitis.   We will proceed with IVIG today as planned.   Hopefully, we will be able to  decrease the Imbruvica dose.  I think we find her levels continue to stabilize with the Waldenstrm's, we will try to cut back on the Imbruvica.  We will plan to get her back in 6 more weeks.  [name], MD 12/16/20209:29 AM

## 2019-03-05 NOTE — Patient Instructions (Signed)
Immune Globulin Injection What is this medicine? IMMUNE GLOBULIN (im MUNE GLOB yoo lin) helps to prevent or reduce the severity of certain infections in patients who are at risk. This medicine is collected from the pooled blood of many donors. It is used to treat immune system problems, thrombocytopenia, and Kawasaki syndrome. This medicine may be used for other purposes; ask your health care provider or pharmacist if you have questions. COMMON BRAND NAME(S): ASCENIV, Baygam, BIVIGAM, Carimune, Carimune NF, cutaquig, Cuvitru, Flebogamma, Flebogamma DIF, GamaSTAN, GamaSTAN S/D, Gamimune N, Gammagard, Gammagard S/D, Gammaked, Gammaplex, Gammar-P IV, Gamunex, Gamunex-C, Hizentra, Iveegam, Iveegam EN, Octagam, Panglobulin, Panglobulin NF, panzyga, Polygam S/D, Privigen, Sandoglobulin, Venoglobulin-S, Vigam, Vivaglobulin, Xembify What should I tell my health care provider before I take this medicine? They need to know if you have any of these conditions:   diabetes   extremely low or no immune antibodies in the blood   heart disease   history of blood clots   hyperprolinemia   infection in the blood, sepsis   kidney disease   taking medicine that may change kidney function - ask your health care provider about your medicine   an unusual or allergic reaction to human immune globulin, albumin, maltose, sucrose, polysorbate 80, other medicines, foods, dyes, or preservatives   pregnant or trying to get pregnant   breast-feeding How should I use this medicine? This medicine is for injection into a muscle or infusion into a vein or skin. It is usually given by a health care professional in a hospital or clinic setting. In rare cases, some brands of this medicine might be given at home. You will be taught how to give this medicine. Use exactly as directed. Take your medicine at regular intervals. Do not take your medicine more often than directed. Talk to your pediatrician regarding  the use of this medicine in children. Special care may be needed. Overdosage: If you think you have taken too much of this medicine contact a poison control center or emergency room at once. NOTE: This medicine is only for you. Do not share this medicine with others. What if I miss a dose? It is important not to miss your dose. Call your doctor or health care professional if you are unable to keep an appointment. If you give yourself the medicine and you miss a dose, take it as soon as you can. If it is almost time for your next dose, take only that dose. Do not take double or extra doses. What may interact with this medicine?  aspirin and aspirin-like medicines  cisplatin  cyclosporine  medicines for infection like acyclovir, adefovir, amphotericin B, bacitracin, cidofovir, foscarnet, ganciclovir, gentamicin, pentamidine, vancomycin  NSAIDS, medicines for pain and inflammation, like ibuprofen or naproxen  pamidronate  vaccines  zoledronic acid This list may not describe all possible interactions. Give your health care provider a list of all the medicines, herbs, non-prescription drugs, or dietary supplements you use. Also tell them if you smoke, drink alcohol, or use illegal drugs. Some items may interact with your medicine. What should I watch for while using this medicine? Your condition will be monitored carefully while you are receiving this medicine. This medicine is made from pooled blood donations of many different people. It may be possible to pass an infection in this medicine. However, the donors are screened for infections and all products are tested for HIV and hepatitis. The medicine is treated to kill most or all bacteria and viruses. Talk to your doctor about   the risks and benefits of this medicine. Do not have vaccinations for at least 14 days before, or until at least 3 months after receiving this medicine. What side effects may I notice from receiving this  medicine? Side effects that you should report to your doctor or health care professional as soon as possible:  allergic reactions like skin rash, itching or hives, swelling of the face, lips, or tongue  breathing problems  chest pain or tightness  fever, chills  headache with nausea, vomiting  neck pain or difficulty moving neck  pain when moving eyes  pain, swelling, warmth in the leg  problems with balance, talking, walking  sudden weight gain  swelling of the ankles, feet, hands  trouble passing urine or change in the amount of urine Side effects that usually do not require medical attention (report to your doctor or health care professional if they continue or are bothersome):  dizzy, drowsy  flushing  increased sweating  leg cramps  muscle aches and pains  pain at site where injected This list may not describe all possible side effects. Call your doctor for medical advice about side effects. You may report side effects to FDA at 1-800-FDA-1088. Where should I keep my medicine? Keep out of the reach of children. This drug is usually given in a hospital or clinic and will not be stored at home. In rare cases, some brands of this medicine may be given at home. If you are using this medicine at home, you will be instructed on how to store this medicine. Throw away any unused medicine after the expiration date on the label. NOTE: This sheet is a summary. It may not cover all possible information. If you have questions about this medicine, talk to your doctor, pharmacist, or health care provider.  2020 Elsevier/Gold Standard (2008-05-27 11:44:49)  

## 2019-03-05 NOTE — Telephone Encounter (Signed)
Appointments scheduled scheduled printed per 12/16 los

## 2019-03-06 LAB — IGG, IGA, IGM
IgA: 12 mg/dL — ABNORMAL LOW (ref 87–352)
IgG (Immunoglobin G), Serum: 642 mg/dL (ref 586–1602)
IgM (Immunoglobulin M), Srm: 183 mg/dL (ref 26–217)

## 2019-03-06 LAB — KAPPA/LAMBDA LIGHT CHAINS
Kappa free light chain: 17.4 mg/L (ref 3.3–19.4)
Kappa, lambda light chain ratio: 6 — ABNORMAL HIGH (ref 0.26–1.65)
Lambda free light chains: 2.9 mg/L — ABNORMAL LOW (ref 5.7–26.3)

## 2019-03-10 LAB — IMMUNOFIXATION REFLEX, SERUM
IgA: 13 mg/dL — ABNORMAL LOW (ref 87–352)
IgG (Immunoglobin G), Serum: 688 mg/dL (ref 586–1602)
IgM (Immunoglobulin M), Srm: 183 mg/dL (ref 26–217)

## 2019-03-10 LAB — PROTEIN ELECTROPHORESIS, SERUM, WITH REFLEX
A/G Ratio: 1.5 (ref 0.7–1.7)
Albumin ELP: 3.8 g/dL (ref 2.9–4.4)
Alpha-1-Globulin: 0.2 g/dL (ref 0.0–0.4)
Alpha-2-Globulin: 0.6 g/dL (ref 0.4–1.0)
Beta Globulin: 0.9 g/dL (ref 0.7–1.3)
Gamma Globulin: 0.7 g/dL (ref 0.4–1.8)
Globulin, Total: 2.5 g/dL (ref 2.2–3.9)
M-Spike, %: 0.2 g/dL — ABNORMAL HIGH
SPEP Interpretation: 0
Total Protein ELP: 6.3 g/dL (ref 6.0–8.5)

## 2019-03-24 ENCOUNTER — Telehealth: Payer: Self-pay | Admitting: Pharmacy Technician

## 2019-03-24 NOTE — Telephone Encounter (Signed)
Oral Oncology Patient Advocate Encounter   Was successful in obtaining a copay card for [place].  This copay card will make the patients copay $10.00.  I have spoken with the patient.    The billing information is as follows and has been shared with [place].   RxBin: [id number] PCN: Loyalty Member ID: [id number] Group ID: [id number]   Kristy [name] Phone [phone] Fax [phone] 03/24/2019 10:26 AM

## 2019-04-02 MED FILL — IMBRUVICA 420 MG TAB: 420 | 28 days supply | Qty: 28 | Fill #11

## 2019-04-15 ENCOUNTER — Encounter: Payer: Self-pay | Admitting: Hematology & Oncology

## 2019-04-15 ENCOUNTER — Other Ambulatory Visit: Payer: Self-pay

## 2019-04-15 ENCOUNTER — Inpatient Hospital Stay: Payer: Federal, State, Local not specified - PPO | Attending: Hematology & Oncology

## 2019-04-15 ENCOUNTER — Inpatient Hospital Stay (HOSPITAL_BASED_OUTPATIENT_CLINIC_OR_DEPARTMENT_OTHER): Payer: Federal, State, Local not specified - PPO | Admitting: Hematology & Oncology

## 2019-04-15 ENCOUNTER — Telehealth: Payer: Self-pay | Admitting: Hematology & Oncology

## 2019-04-15 ENCOUNTER — Inpatient Hospital Stay: Payer: Federal, State, Local not specified - PPO

## 2019-04-15 VITALS — BP 136/96 | HR 95 | Temp 97.1°F | Resp 18 | Ht 60.0 in | Wt 116.8 lb

## 2019-04-15 VITALS — BP 116/67 | HR 83 | Resp 18

## 2019-04-15 DIAGNOSIS — C880 Waldenstrom macroglobulinemia: Secondary | ICD-10-CM | POA: Insufficient documentation

## 2019-04-15 DIAGNOSIS — Z79899 Other long term (current) drug therapy: Secondary | ICD-10-CM | POA: Insufficient documentation

## 2019-04-15 DIAGNOSIS — J329 Chronic sinusitis, unspecified: Secondary | ICD-10-CM | POA: Diagnosis not present

## 2019-04-15 DIAGNOSIS — J0101 Acute recurrent maxillary sinusitis: Secondary | ICD-10-CM

## 2019-04-15 DIAGNOSIS — D510 Vitamin B12 deficiency anemia due to intrinsic factor deficiency: Secondary | ICD-10-CM | POA: Insufficient documentation

## 2019-04-15 DIAGNOSIS — D801 Nonfamilial hypogammaglobulinemia: Secondary | ICD-10-CM | POA: Diagnosis present

## 2019-04-15 HISTORY — DX: Vitamin B12 deficiency anemia due to intrinsic factor deficiency: D51.0

## 2019-04-15 LAB — CMP (CANCER CENTER ONLY)
ALT: 35 U/L (ref 0–44)
AST: 36 U/L (ref 15–41)
Albumin: 4.5 g/dL (ref 3.5–5.0)
Alkaline Phosphatase: 58 U/L (ref 38–126)
Anion gap: 9 (ref 5–15)
BUN: 13 mg/dL (ref 8–23)
CO2: 27 mmol/L (ref 22–32)
Calcium: 9.2 mg/dL (ref 8.9–10.3)
Chloride: 105 mmol/L (ref 98–111)
Creatinine: 0.87 mg/dL (ref 0.44–1.00)
GFR, Est AFR Am: >60 mL/min (ref >60)
GFR, Estimated: >60 mL/min (ref >60)
Glucose, Bld: 99 mg/dL (ref 70–99)
Potassium: 4.2 mmol/L (ref 3.5–5.1)
Sodium: 141 mmol/L (ref 135–145)
Total Bilirubin: 1.3 mg/dL — ABNORMAL HIGH (ref 0.3–1.2)
Total Protein: 6.7 g/dL (ref 6.5–8.1)

## 2019-04-15 LAB — CBC WITH DIFFERENTIAL (CANCER CENTER ONLY)
Abs Immature Granulocytes: 0.1 10*3/uL — ABNORMAL HIGH (ref 0.00–0.07)
Basophils Absolute: 0 10*3/uL (ref 0.0–0.1)
Basophils Relative: 1 %
Eosinophils Absolute: 0.1 10*3/uL (ref 0.0–0.5)
Eosinophils Relative: 1 %
HCT: 40.8 % (ref 36.0–46.0)
Hemoglobin: 13.7 g/dL (ref 12.0–15.0)
Immature Granulocytes: 1 %
Lymphocytes Relative: 24 %
Lymphs Abs: 1.9 10*3/uL (ref 0.7–4.0)
MCH: 34.7 pg — ABNORMAL HIGH (ref 26.0–34.0)
MCHC: 33.6 g/dL (ref 30.0–36.0)
MCV: 103.3 fL — ABNORMAL HIGH (ref 80.0–100.0)
Monocytes Absolute: 0.5 10*3/uL (ref 0.1–1.0)
Monocytes Relative: 7 %
Neutro Abs: 5.3 10*3/uL (ref 1.7–7.7)
Neutrophils Relative %: 66 %
Platelet Count: 118 10*3/uL — ABNORMAL LOW (ref 150–400)
RBC: 3.95 MIL/uL (ref 3.87–5.11)
RDW: 12.3 % (ref 11.5–15.5)
WBC Count: 8 10*3/uL (ref 4.0–10.5)
nRBC: 0 % (ref 0.0–0.2)

## 2019-04-15 MED ORDER — ACETAMINOPHEN 325 MG PO TABS
ORAL_TABLET | ORAL | Status: AC
  Filled 2019-04-15: qty 2

## 2019-04-15 MED ORDER — DIPHENHYDRAMINE HCL 25 MG PO CAPS
ORAL_CAPSULE | ORAL | Status: AC
  Filled 2019-04-15: qty 1

## 2019-04-15 MED ORDER — ACETAMINOPHEN 325 MG PO TABS
650.0000 mg | ORAL_TABLET | Freq: Once | ORAL | Status: AC
  Administered 2019-04-15: 650 mg via ORAL

## 2019-04-15 MED ORDER — CYANOCOBALAMIN 1000 MCG/ML IJ SOLN
INTRAMUSCULAR | Status: AC
  Filled 2019-04-15: qty 1

## 2019-04-15 MED ORDER — DIPHENHYDRAMINE HCL 25 MG PO CAPS
25.0000 mg | ORAL_CAPSULE | Freq: Once | ORAL | Status: AC
  Administered 2019-04-15: 25 mg via ORAL

## 2019-04-15 MED ORDER — CYANOCOBALAMIN 1000 MCG/ML IJ SOLN
1000.0000 ug | Freq: Once | INTRAMUSCULAR | Status: AC
  Administered 2019-04-15: 1000 ug via INTRAMUSCULAR

## 2019-04-15 MED ORDER — IMMUNE GLOBULIN (HUMAN) 20 GM/200ML IV SOLN
40.0000 g | Freq: Once | INTRAVENOUS | Status: AC
  Administered 2019-04-15: 40 g via INTRAVENOUS
  Filled 2019-04-15: qty 400

## 2019-04-15 MED ORDER — DEXTROSE 5 % IV SOLN
Freq: Once | INTRAVENOUS | Status: AC
  Filled 2019-04-15: qty 250

## 2019-04-15 NOTE — Telephone Encounter (Signed)
Appointments scheduled calendar printed per 1/26 los 

## 2019-04-15 NOTE — Patient Instructions (Signed)
Immune Globulin Injection What is this medicine? IMMUNE GLOBULIN (im MUNE GLOB yoo lin) helps to prevent or reduce the severity of certain infections in patients who are at risk. This medicine is collected from the pooled blood of many donors. It is used to treat immune system problems, thrombocytopenia, and Kawasaki syndrome. This medicine may be used for other purposes; ask your health care provider or pharmacist if you have questions. COMMON BRAND NAME(S): ASCENIV, Baygam, BIVIGAM, Carimune, Carimune NF, cutaquig, Cuvitru, Flebogamma, Flebogamma DIF, GamaSTAN, GamaSTAN S/D, Gamimune N, Gammagard, Gammagard S/D, Gammaked, Gammaplex, Gammar-P IV, Gamunex, Gamunex-C, Hizentra, Iveegam, Iveegam EN, Octagam, Panglobulin, Panglobulin NF, panzyga, Polygam S/D, Privigen, Sandoglobulin, Venoglobulin-S, Vigam, Vivaglobulin, Xembify What should I tell my health care provider before I take this medicine? They need to know if you have any of these conditions:  diabetes  extremely low or no immune antibodies in the blood  heart disease  history of blood clots  hyperprolinemia  infection in the blood, sepsis  kidney disease  recently received or scheduled to receive a vaccination  an unusual or allergic reaction to human immune globulin, albumin, maltose, sucrose, other medicines, foods, dyes, or preservatives  pregnant or trying to get pregnant  breast-feeding How should I use this medicine? This medicine is for injection into a muscle or infusion into a vein or skin. It is usually given by a health care professional in a hospital or clinic setting. In rare cases, some brands of this medicine might be given at home. You will be taught how to give this medicine. Use exactly as directed. Take your medicine at regular intervals. Do not take your medicine more often than directed. Talk to your pediatrician regarding the use of this medicine in children. While this drug may be prescribed for selected  conditions, precautions do apply. Overdosage: If you think you have taken too much of this medicine contact a poison control center or emergency room at once. NOTE: This medicine is only for you. Do not share this medicine with others. What if I miss a dose? It is important not to miss your dose. Call your doctor or health care professional if you are unable to keep an appointment. If you give yourself the medicine and you miss a dose, take it as soon as you can. If it is almost time for your next dose, take only that dose. Do not take double or extra doses. What may interact with this medicine?  aspirin and aspirin-like medicines  cisplatin  cyclosporine  medicines for infection like acyclovir, adefovir, amphotericin B, bacitracin, cidofovir, foscarnet, ganciclovir, gentamicin, pentamidine, vancomycin  NSAIDS, medicines for pain and inflammation, like ibuprofen or naproxen  pamidronate  vaccines  zoledronic acid This list may not describe all possible interactions. Give your health care provider a list of all the medicines, herbs, non-prescription drugs, or dietary supplements you use. Also tell them if you smoke, drink alcohol, or use illegal drugs. Some items may interact with your medicine. What should I watch for while using this medicine? Your condition will be monitored carefully while you are receiving this medicine. This medicine is made from pooled blood donations of many different people. It may be possible to pass an infection in this medicine. However, the donors are screened for infections and all products are tested for HIV and hepatitis. The medicine is treated to kill most or all bacteria and viruses. Talk to your doctor about the risks and benefits of this medicine. Do not have vaccinations for at least   14 days before, or until at least 3 months after receiving this medicine. What side effects may I notice from receiving this medicine? Side effects that you should report  to your doctor or health care professional as soon as possible:  allergic reactions like skin rash, itching or hives, swelling of the face, lips, or tongue  blue colored lips or skin  breathing problems  chest pain or tightness  fever  signs and symptoms of aseptic meningitis such as stiff neck; sensitivity to light; headache; drowsiness; fever; nausea; vomiting; rash  signs and symptoms of a blood clot such as chest pain; shortness of breath; pain, swelling, or warmth in the leg  signs and symptoms of hemolytic anemia such as fast heartbeat; tiredness; dark yellow or brown urine; or yellowing of the eyes or skin  signs and symptoms of kidney injury like trouble passing urine or change in the amount of urine  sudden weight gain  swelling of the ankles, feet, hands Side effects that usually do not require medical attention (report to your doctor or health care professional if they continue or are bothersome):  diarrhea  flushing  headache  increased sweating  joint pain  muscle cramps  muscle pain  nausea  pain, redness, or irritation at site where injected  tiredness This list may not describe all possible side effects. Call your doctor for medical advice about side effects. You may report side effects to FDA at 1-800-FDA-1088. Where should I keep my medicine? Keep out of the reach of children. This drug is usually given in a hospital or clinic and will not be stored at home. In rare cases, some brands of this medicine may be given at home. If you are using this medicine at home, you will be instructed on how to store this medicine. Throw away any unused medicine after the expiration date on the label. NOTE: This sheet is a summary. It may not cover all possible information. If you have questions about this medicine, talk to your doctor, pharmacist, or health care provider.  2020 Elsevier/Gold Standard (2018-10-09 12:51:14)  

## 2019-04-15 NOTE — Progress Notes (Signed)
Hematology and Oncology Follow Up Visit  Kristy [name] [id number] 09-05-1955 64 y.o. 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  She is doing quite well.  She had no problems over the Christmas and New Year's holiday.  She did busy at work.  She has had no issues with fatigue or weakness.  She has had no problem with infections.  The IVIG clearly is helping her.  [name] is really doing well for her.  Her M spike was 0.2 g/dL when we last checked it.  Her IgM level was 183 mg/dL.  She does have pernicious anemia.  She wants to have the vitamin B12 done at the office.  We will going give her a dose today.  She gets B12 every other month.  She has had no problems with fever.  She has had no change in bowel or bladder habits.  She has had no rashes.  There is been no problems with cough or shortness of breath.  Overall, her performance status is ECOG 0.    Medications:  Allergies as of 04/15/2019   No Known Allergies     Medication List       Accurate as of April 15, 2019  8:42 AM. If you have any questions, ask your nurse or doctor.        cyanocobalamin 1000 MCG/ML injection Commonly known as: (VITAMIN B-12) INJECT 1 ML INTO THE MUSCLE EVERY 2 MONTHS   Imbruvica 420 MG Tabs Generic drug: Ibrutinib TAKE 1 TABLET BY MOUTH DAILY AFTER BREAKFAST.   Vitamin D3 25 MCG tablet Commonly known as: Vitamin D Take by mouth daily.   Xiidra 5 % Soln Generic drug: Lifitegrast Apply 1 drop to eye 2 (two) times daily.       Allergies: No Known Allergies  Past Medical History, Surgical history, Social history, and Family History were reviewed and  updated.  Review of Systems: Review of Systems  Constitutional: Negative.   HENT: Negative.   Eyes: Negative.   Respiratory: Negative.   Cardiovascular: Negative.   Gastrointestinal: Negative.   Genitourinary: Negative.   Musculoskeletal: Negative.   Skin: Negative.   Neurological: Negative.   Endo/Heme/Allergies: Negative.   Psychiatric/Behavioral: The patient is nervous/anxious.       Physical Exam:  height is 5' (1.524 m) and weight is 116 lb 12.8 oz (53 kg). Her temporal temperature is 97.1 F (36.2 C) (abnormal). Her blood pressure is 136/96 (abnormal) and her pulse is 95. Her respiration is 18 and oxygen saturation is 98%.   Wt Readings from Last 3 Encounters:  04/15/19 116 lb 12.8 oz (53 kg)  03/05/19 116 lb 1.3 oz (52.7 kg)  01/28/19 116 lb (52.6 kg)    Physical Exam Vitals reviewed.  HENT:     Head: Normocephalic and atraumatic.  Eyes:     Pupils: Pupils are equal, round, and reactive to light.  Cardiovascular:     Rate and Rhythm: Normal rate and regular rhythm.     Heart sounds: Normal heart sounds.  Pulmonary:     Effort: Pulmonary effort is normal.     Breath sounds: Normal breath sounds.  Abdominal:     General: Bowel sounds are normal.  Palpations: Abdomen is soft.  Musculoskeletal:        General: No tenderness or deformity. Normal range of motion.     Cervical back: Normal range of motion.  Lymphadenopathy:     Cervical: No cervical adenopathy.  Skin:    General: Skin is warm and dry.     Findings: No erythema or rash.  Neurological:     Mental Status: She is alert and oriented to person, place, and time.  Psychiatric:        Behavior: Behavior normal.        Thought Content: Thought content normal.        Judgment: Judgment normal.      Lab Results  Component Value Date   WBC 8.0 04/15/2019   HGB 13.7 04/15/2019   HCT 40.8 04/15/2019   MCV 103.3 (H) 04/15/2019   PLT 118 (L) 04/15/2019   No results found for: FERRITIN, IRON,  TIBC, UIBC, IRONPCTSAT Lab Results  Component Value Date   RETICCTPCT 2.9 (H) 10/30/2014   RBC 3.95 04/15/2019   RETICCTABS 98.3 10/30/2014   Lab Results  Component Value Date   KPAFRELGTCHN 17.4 03/05/2019   LAMBDASER 2.9 (L) 03/05/2019   KAPLAMBRATIO 6.00 (H) 03/05/2019   Lab Results  Component Value Date   IGGSERUM 688 03/05/2019   IGA 13 (L) 03/05/2019   IGMSERUM 183 03/05/2019   Lab Results  Component Value Date   TOTALPROTELP 6.3 03/05/2019   ALBUMINELP 3.8 03/05/2019   A1GS 0.2 03/05/2019   A2GS 0.6 03/05/2019   BETS 0.9 03/05/2019   BETA2SER 0.2 02/15/2015   GAMS 0.7 03/05/2019   MSPIKE 0.2 (H) 03/05/2019   SPEI Comment 11/12/2018     Chemistry      Component Value Date/Time   NA 141 04/15/2019 0749   NA 147 (H) 03/19/2017 0849   NA 139 03/16/2016 1337   K 4.2 04/15/2019 0749   K 4.5 03/19/2017 0849   K 3.8 03/16/2016 1337   CL 105 04/15/2019 0749   CL 108 03/19/2017 0849   CO2 27 04/15/2019 0749   CO2 31 03/19/2017 0849   CO2 27 03/16/2016 1337   BUN 13 04/15/2019 0749   BUN 11 03/19/2017 0849   BUN 5.2 (L) 03/16/2016 1337   CREATININE 0.87 04/15/2019 0749   CREATININE 1.0 03/19/2017 0849   CREATININE 0.7 03/16/2016 1337      Component Value Date/Time   CALCIUM 9.2 04/15/2019 0749   CALCIUM 9.6 03/19/2017 0849   CALCIUM 8.8 03/16/2016 1337   ALKPHOS 58 04/15/2019 0749   ALKPHOS 53 03/19/2017 0849   ALKPHOS 62 03/16/2016 1337   AST 36 04/15/2019 0749   AST 24 03/16/2016 1337   ALT 35 04/15/2019 0749   ALT 23 03/19/2017 0849   ALT 19 03/16/2016 1337   BILITOT 1.3 (H) 04/15/2019 0749   BILITOT 0.57 03/16/2016 1337       Impression and Plan: Kristy [name] is a very pleasant 64 yo caucasian female with Waldenstrom's with acquired hypogammaglobulinemia and recurrent sinusitis.   We will proceed with IVIG today as planned.   We will also make sure she gets her vitamin B12 today.  Hopefully, we will be able to decrease the Imbruvica dose.  I  think we find her levels continue to stabilize with the Waldenstrm's, we will try to cut back on the Imbruvica.  We will plan to get her back in 6 more weeks.  [name], MD 1/26/20218:42 AM

## 2019-04-16 ENCOUNTER — Ambulatory Visit: Payer: Federal, State, Local not specified - PPO

## 2019-04-16 ENCOUNTER — Ambulatory Visit: Payer: Federal, State, Local not specified - PPO | Admitting: Hematology & Oncology

## 2019-04-16 ENCOUNTER — Other Ambulatory Visit: Payer: Federal, State, Local not specified - PPO

## 2019-04-16 LAB — IGG, IGA, IGM
IgA: 13 mg/dL — ABNORMAL LOW (ref 87–352)
IgG (Immunoglobin G), Serum: 621 mg/dL (ref 586–1602)
IgM (Immunoglobulin M), Srm: 184 mg/dL (ref 26–217)

## 2019-04-16 LAB — KAPPA/LAMBDA LIGHT CHAINS
Kappa free light chain: 15 mg/L (ref 3.3–19.4)
Kappa, lambda light chain ratio: 5.17 — ABNORMAL HIGH (ref 0.26–1.65)
Lambda free light chains: 2.9 mg/L — ABNORMAL LOW (ref 5.7–26.3)

## 2019-04-18 LAB — PROTEIN ELECTROPHORESIS, SERUM, WITH REFLEX
A/G Ratio: 1.9 — ABNORMAL HIGH (ref 0.7–1.7)
Albumin ELP: 4.2 g/dL (ref 2.9–4.4)
Alpha-1-Globulin: 0.2 g/dL (ref 0.0–0.4)
Alpha-2-Globulin: 0.5 g/dL (ref 0.4–1.0)
Beta Globulin: 0.9 g/dL (ref 0.7–1.3)
Gamma Globulin: 0.6 g/dL (ref 0.4–1.8)
Globulin, Total: 2.2 g/dL (ref 2.2–3.9)
M-Spike, %: 0.2 g/dL — ABNORMAL HIGH
SPEP Interpretation: 0
Total Protein ELP: 6.4 g/dL (ref 6.0–8.5)

## 2019-04-18 LAB — IMMUNOFIXATION REFLEX, SERUM
IgA: 13 mg/dL — ABNORMAL LOW (ref 87–352)
IgG (Immunoglobin G), Serum: 604 mg/dL (ref 586–1602)
IgM (Immunoglobulin M), Srm: 181 mg/dL (ref 26–217)

## 2019-04-21 ENCOUNTER — Encounter: Payer: Self-pay | Admitting: *Deleted

## 2019-04-28 MED FILL — IMBRUVICA 420 MG TAB: 420 | 28 days supply | Qty: 28 | Fill #12

## 2019-05-23 ENCOUNTER — Other Ambulatory Visit: Payer: Self-pay | Admitting: Hematology & Oncology

## 2019-05-23 ENCOUNTER — Inpatient Hospital Stay (HOSPITAL_BASED_OUTPATIENT_CLINIC_OR_DEPARTMENT_OTHER): Payer: Federal, State, Local not specified - PPO | Admitting: Hematology & Oncology

## 2019-05-23 ENCOUNTER — Inpatient Hospital Stay: Payer: Federal, State, Local not specified - PPO

## 2019-05-23 ENCOUNTER — Encounter: Payer: Self-pay | Admitting: Hematology & Oncology

## 2019-05-23 ENCOUNTER — Other Ambulatory Visit: Payer: Self-pay

## 2019-05-23 ENCOUNTER — Inpatient Hospital Stay: Payer: Federal, State, Local not specified - PPO | Attending: Hematology & Oncology

## 2019-05-23 VITALS — BP 138/66 | HR 80 | Temp 97.7°F | Resp 20

## 2019-05-23 VITALS — BP 148/72 | HR 85 | Temp 95.7°F | Resp 16 | Wt 116.0 lb

## 2019-05-23 DIAGNOSIS — Z79899 Other long term (current) drug therapy: Secondary | ICD-10-CM | POA: Diagnosis not present

## 2019-05-23 DIAGNOSIS — C880 Waldenstrom macroglobulinemia: Secondary | ICD-10-CM | POA: Insufficient documentation

## 2019-05-23 DIAGNOSIS — J329 Chronic sinusitis, unspecified: Secondary | ICD-10-CM | POA: Insufficient documentation

## 2019-05-23 DIAGNOSIS — J0101 Acute recurrent maxillary sinusitis: Secondary | ICD-10-CM

## 2019-05-23 DIAGNOSIS — D510 Vitamin B12 deficiency anemia due to intrinsic factor deficiency: Secondary | ICD-10-CM | POA: Insufficient documentation

## 2019-05-23 DIAGNOSIS — D801 Nonfamilial hypogammaglobulinemia: Secondary | ICD-10-CM | POA: Diagnosis present

## 2019-05-23 LAB — CMP (CANCER CENTER ONLY)
ALT: 18 U/L (ref 0–44)
AST: 22 U/L (ref 15–41)
Albumin: 4.5 g/dL (ref 3.5–5.0)
Alkaline Phosphatase: 51 U/L (ref 38–126)
Anion gap: 8 (ref 5–15)
BUN: 11 mg/dL (ref 8–23)
CO2: 27 mmol/L (ref 22–32)
Calcium: 9.1 mg/dL (ref 8.9–10.3)
Chloride: 106 mmol/L (ref 98–111)
Creatinine: 0.73 mg/dL (ref 0.44–1.00)
GFR, Est AFR Am: >60 mL/min (ref >60)
GFR, Estimated: >60 mL/min (ref >60)
Glucose, Bld: 98 mg/dL (ref 70–99)
Potassium: 4.1 mmol/L (ref 3.5–5.1)
Sodium: 141 mmol/L (ref 135–145)
Total Bilirubin: 1.2 mg/dL (ref 0.3–1.2)
Total Protein: 6.7 g/dL (ref 6.5–8.1)

## 2019-05-23 LAB — CBC WITH DIFFERENTIAL (CANCER CENTER ONLY)
Abs Immature Granulocytes: 0.1 10*3/uL — ABNORMAL HIGH (ref 0.00–0.07)
Basophils Absolute: 0 10*3/uL (ref 0.0–0.1)
Basophils Relative: 0 %
Eosinophils Absolute: 0.1 10*3/uL (ref 0.0–0.5)
Eosinophils Relative: 1 %
HCT: 40 % (ref 36.0–46.0)
Hemoglobin: 13.7 g/dL (ref 12.0–15.0)
Immature Granulocytes: 1 %
Lymphocytes Relative: 26 %
Lymphs Abs: 2.1 10*3/uL (ref 0.7–4.0)
MCH: 35.3 pg — ABNORMAL HIGH (ref 26.0–34.0)
MCHC: 34.3 g/dL (ref 30.0–36.0)
MCV: 103.1 fL — ABNORMAL HIGH (ref 80.0–100.0)
Monocytes Absolute: 0.6 10*3/uL (ref 0.1–1.0)
Monocytes Relative: 7 %
Neutro Abs: 5.1 10*3/uL (ref 1.7–7.7)
Neutrophils Relative %: 65 %
Platelet Count: 121 10*3/uL — ABNORMAL LOW (ref 150–400)
RBC: 3.88 MIL/uL (ref 3.87–5.11)
RDW: 12.5 % (ref 11.5–15.5)
WBC Count: 7.9 10*3/uL (ref 4.0–10.5)
nRBC: 0 % (ref 0.0–0.2)

## 2019-05-23 LAB — VITAMIN B12: Vitamin B-12: 202 pg/mL (ref 180–914)

## 2019-05-23 MED ORDER — DIPHENHYDRAMINE HCL 25 MG PO CAPS
25.0000 mg | ORAL_CAPSULE | Freq: Once | ORAL | Status: AC
  Administered 2019-05-23: 25 mg via ORAL

## 2019-05-23 MED ORDER — CYANOCOBALAMIN 1000 MCG/ML IJ SOLN
INTRAMUSCULAR | Status: AC
  Filled 2019-05-23: qty 1

## 2019-05-23 MED ORDER — CYANOCOBALAMIN 1000 MCG/ML IJ SOLN
1000.0000 ug | Freq: Once | INTRAMUSCULAR | Status: AC
  Administered 2019-05-23: 1000 ug via INTRAMUSCULAR

## 2019-05-23 MED ORDER — ACETAMINOPHEN 325 MG PO TABS
650.0000 mg | ORAL_TABLET | Freq: Once | ORAL | Status: AC
  Administered 2019-05-23: 650 mg via ORAL

## 2019-05-23 MED ORDER — DEXTROSE 5 % IV SOLN
INTRAVENOUS | Status: DC
  Filled 2019-05-23 (×2): qty 250

## 2019-05-23 MED ORDER — IMMUNE GLOBULIN (HUMAN) 20 GM/200ML IV SOLN
40.0000 g | Freq: Once | INTRAVENOUS | Status: AC
  Administered 2019-05-23: 40 g via INTRAVENOUS
  Filled 2019-05-23: qty 400

## 2019-05-23 MED ORDER — DIPHENHYDRAMINE HCL 25 MG PO CAPS
ORAL_CAPSULE | ORAL | Status: AC
  Filled 2019-05-23: qty 1

## 2019-05-23 MED ORDER — ACETAMINOPHEN 325 MG PO TABS
ORAL_TABLET | ORAL | Status: AC
  Filled 2019-05-23: qty 2

## 2019-05-23 NOTE — Progress Notes (Signed)
Hematology and Oncology Follow Up Visit  Kristy [name] [id number] 1955-05-16 64 y.o. 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  She is doing quite well.  So far, she really has had no complaints.  She is enjoying Kristy granddaughter.  She is showing a very nice picture of Kristy [name].  Kristy last M spike was 0.2 g/dL.  This is holding stable.  Kristy IgM level was 182 mg/dL.  She has had no problems with fever.  She has had no rashes.  There is been no change in bowel or bladder habits.  She has had no cough.  She has had no headache.  There is been no leg swelling.   The IgG level was 615 mg/dL back in January.  Kristy IVIG definitely is helping Kristy out.  She has had no issues with Kristy sinuses.  Overall, Kristy performance status is ECOG 0.    Medications:  Allergies as of 05/23/2019   No Known Allergies     Medication List       Accurate as of May 23, 2019  8:39 AM. If you have any questions, ask your nurse or doctor.        cyanocobalamin 1000 MCG/ML injection Commonly known as: (VITAMIN B-12) INJECT 1 ML INTO THE MUSCLE EVERY 2 MONTHS   Imbruvica 420 MG Tabs Generic drug: Ibrutinib TAKE 1 TABLET BY MOUTH DAILY AFTER BREAKFAST.   Vitamin D3 25 MCG tablet Commonly known as: Vitamin D Take by mouth daily.   Xiidra 5 % Soln Generic drug: Lifitegrast Apply 1 drop to eye 2 (two) times daily.       Allergies: No Known Allergies  Past Medical History, Surgical history, Social history, and Family History were reviewed and updated.  Review of Systems: Review of Systems  Constitutional: Negative.   HENT: Negative.   Eyes: Negative.     Respiratory: Negative.   Cardiovascular: Negative.   Gastrointestinal: Negative.   Genitourinary: Negative.   Musculoskeletal: Negative.   Skin: Negative.   Neurological: Negative.   Endo/Heme/Allergies: Negative.   Psychiatric/Behavioral: The patient is nervous/anxious.       Physical Exam:  weight is 116 lb (52.6 kg). Kristy temporal temperature is 95.7 F (35.4 C) (abnormal). Kristy blood pressure is 148/72 (abnormal) and Kristy pulse is 85. Kristy respiration is 16 and oxygen saturation is 98%.   Wt Readings from Last 3 Encounters:  05/23/19 116 lb (52.6 kg)  04/15/19 116 lb 12.8 oz (53 kg)  03/05/19 116 lb 1.3 oz (52.7 kg)    Physical Exam Vitals reviewed.  HENT:     Head: Normocephalic and atraumatic.  Eyes:     Pupils: Pupils are equal, round, and reactive to light.  Cardiovascular:     Rate and Rhythm: Normal rate and regular rhythm.     Heart sounds: Normal heart sounds.  Pulmonary:     Effort: Pulmonary effort is normal.     Breath sounds: Normal breath sounds.  Abdominal:     General: Bowel sounds are normal.     Palpations: Abdomen is soft.  Musculoskeletal:        General: No tenderness or deformity. Normal range of motion.  Cervical back: Normal range of motion.  Lymphadenopathy:     Cervical: No cervical adenopathy.  Skin:    General: Skin is warm and dry.     Findings: No erythema or rash.  Neurological:     Mental Status: She is alert and oriented to person, place, and time.  Psychiatric:        Behavior: Behavior normal.        Thought Content: Thought content normal.        Judgment: Judgment normal.      Lab Results  Component Value Date   WBC 7.9 05/23/2019   HGB 13.7 05/23/2019   HCT 40.0 05/23/2019   MCV 103.1 (H) 05/23/2019   PLT 121 (L) 05/23/2019   No results found for: FERRITIN, IRON, TIBC, UIBC, IRONPCTSAT Lab Results  Component Value Date   RETICCTPCT 2.9 (H) 10/30/2014   RBC 3.88 05/23/2019   RETICCTABS 98.3 10/30/2014   Lab  Results  Component Value Date   KPAFRELGTCHN 15.0 04/15/2019   LAMBDASER 2.9 (L) 04/15/2019   KAPLAMBRATIO 5.17 (H) 04/15/2019   Lab Results  Component Value Date   IGGSERUM 621 04/15/2019   IGGSERUM 604 04/15/2019   IGA 13 (L) 04/15/2019   IGA 13 (L) 04/15/2019   IGMSERUM 184 04/15/2019   IGMSERUM 181 04/15/2019   Lab Results  Component Value Date   TOTALPROTELP 6.4 04/15/2019   ALBUMINELP 4.2 04/15/2019   A1GS 0.2 04/15/2019   A2GS 0.5 04/15/2019   BETS 0.9 04/15/2019   BETA2SER 0.2 02/15/2015   GAMS 0.6 04/15/2019   MSPIKE 0.2 (H) 04/15/2019   SPEI Comment 11/12/2018     Chemistry      Component Value Date/Time   NA 141 05/23/2019 0754   NA 147 (H) 03/19/2017 0849   NA 139 03/16/2016 1337   K 4.1 05/23/2019 0754   K 4.5 03/19/2017 0849   K 3.8 03/16/2016 1337   CL 106 05/23/2019 0754   CL 108 03/19/2017 0849   CO2 27 05/23/2019 0754   CO2 31 03/19/2017 0849   CO2 27 03/16/2016 1337   BUN 11 05/23/2019 0754   BUN 11 03/19/2017 0849   BUN 5.2 (L) 03/16/2016 1337   CREATININE 0.73 05/23/2019 0754   CREATININE 1.0 03/19/2017 0849   CREATININE 0.7 03/16/2016 1337      Component Value Date/Time   CALCIUM 9.1 05/23/2019 0754   CALCIUM 9.6 03/19/2017 0849   CALCIUM 8.8 03/16/2016 1337   ALKPHOS 51 05/23/2019 0754   ALKPHOS 53 03/19/2017 0849   ALKPHOS 62 03/16/2016 1337   AST 22 05/23/2019 0754   AST 24 03/16/2016 1337   ALT 18 05/23/2019 0754   ALT 23 03/19/2017 0849   ALT 19 03/16/2016 1337   BILITOT 1.2 05/23/2019 0754   BILITOT 0.57 03/16/2016 1337       Impression and Plan: Kristy [name] is a very pleasant 64 yo caucasian female with Waldenstrom's with acquired hypogammaglobulinemia and recurrent sinusitis.   We will proceed with IVIG today as planned.   We will also make sure she gets Kristy vitamin B12 today.  Hopefully, we will be able to decrease the Imbruvica dose.  I think we find Kristy levels continue to stabilize with the Waldenstrm's, we will  try to cut back on the Imbruvica.  We will plan to get Kristy back in 6 more weeks.  [name], MD 3/5/20218:39 AM

## 2019-05-23 NOTE — Patient Instructions (Signed)
Immune Globulin Injection What is this medicine? IMMUNE GLOBULIN (im MUNE GLOB yoo lin) helps to prevent or reduce the severity of certain infections in patients who are at risk. This medicine is collected from the pooled blood of many donors. It is used to treat immune system problems, thrombocytopenia, and Kawasaki syndrome. This medicine may be used for other purposes; ask your health care provider or pharmacist if you have questions. COMMON BRAND NAME(S): ASCENIV, Baygam, BIVIGAM, Carimune, Carimune NF, cutaquig, Cuvitru, Flebogamma, Flebogamma DIF, GamaSTAN, GamaSTAN S/D, Gamimune N, Gammagard, Gammagard S/D, Gammaked, Gammaplex, Gammar-P IV, Gamunex, Gamunex-C, Hizentra, Iveegam, Iveegam EN, Octagam, Panglobulin, Panglobulin NF, panzyga, Polygam S/D, Privigen, Sandoglobulin, Venoglobulin-S, Vigam, Vivaglobulin, Xembify What should I tell my health care provider before I take this medicine? They need to know if you have any of these conditions:  diabetes  extremely low or no immune antibodies in the blood  heart disease  history of blood clots  hyperprolinemia  infection in the blood, sepsis  kidney disease  recently received or scheduled to receive a vaccination  an unusual or allergic reaction to human immune globulin, albumin, maltose, sucrose, other medicines, foods, dyes, or preservatives  pregnant or trying to get pregnant  breast-feeding How should I use this medicine? This medicine is for injection into a muscle or infusion into a vein or skin. It is usually given by a health care professional in a hospital or clinic setting. In rare cases, some brands of this medicine might be given at home. You will be taught how to give this medicine. Use exactly as directed. Take your medicine at regular intervals. Do not take your medicine more often than directed. Talk to your pediatrician regarding the use of this medicine in children. While this drug may be prescribed for selected  conditions, precautions do apply. Overdosage: If you think you have taken too much of this medicine contact a poison control center or emergency room at once. NOTE: This medicine is only for you. Do not share this medicine with others. What if I miss a dose? It is important not to miss your dose. Call your doctor or health care professional if you are unable to keep an appointment. If you give yourself the medicine and you miss a dose, take it as soon as you can. If it is almost time for your next dose, take only that dose. Do not take double or extra doses. What may interact with this medicine?  aspirin and aspirin-like medicines  cisplatin  cyclosporine  medicines for infection like acyclovir, adefovir, amphotericin B, bacitracin, cidofovir, foscarnet, ganciclovir, gentamicin, pentamidine, vancomycin  NSAIDS, medicines for pain and inflammation, like ibuprofen or naproxen  pamidronate  vaccines  zoledronic acid This list may not describe all possible interactions. Give your health care provider a list of all the medicines, herbs, non-prescription drugs, or dietary supplements you use. Also tell them if you smoke, drink alcohol, or use illegal drugs. Some items may interact with your medicine. What should I watch for while using this medicine? Your condition will be monitored carefully while you are receiving this medicine. This medicine is made from pooled blood donations of many different people. It may be possible to pass an infection in this medicine. However, the donors are screened for infections and all products are tested for HIV and hepatitis. The medicine is treated to kill most or all bacteria and viruses. Talk to your doctor about the risks and benefits of this medicine. Do not have vaccinations for at least   14 days before, or until at least 3 months after receiving this medicine. What side effects may I notice from receiving this medicine? Side effects that you should report  to your doctor or health care professional as soon as possible:  allergic reactions like skin rash, itching or hives, swelling of the face, lips, or tongue  blue colored lips or skin  breathing problems  chest pain or tightness  fever  signs and symptoms of aseptic meningitis such as stiff neck; sensitivity to light; headache; drowsiness; fever; nausea; vomiting; rash  signs and symptoms of a blood clot such as chest pain; shortness of breath; pain, swelling, or warmth in the leg  signs and symptoms of hemolytic anemia such as fast heartbeat; tiredness; dark yellow or brown urine; or yellowing of the eyes or skin  signs and symptoms of kidney injury like trouble passing urine or change in the amount of urine  sudden weight gain  swelling of the ankles, feet, hands Side effects that usually do not require medical attention (report to your doctor or health care professional if they continue or are bothersome):  diarrhea  flushing  headache  increased sweating  joint pain  muscle cramps  muscle pain  nausea  pain, redness, or irritation at site where injected  tiredness This list may not describe all possible side effects. Call your doctor for medical advice about side effects. You may report side effects to FDA at 1-800-FDA-1088. Where should I keep my medicine? Keep out of the reach of children. This drug is usually given in a hospital or clinic and will not be stored at home. In rare cases, some brands of this medicine may be given at home. If you are using this medicine at home, you will be instructed on how to store this medicine. Throw away any unused medicine after the expiration date on the label. NOTE: This sheet is a summary. It may not cover all possible information. If you have questions about this medicine, talk to your doctor, pharmacist, or health care provider.  2020 Elsevier/Gold Standard (2018-10-09 12:51:14)  

## 2019-05-24 LAB — IGG, IGA, IGM
IgA: 12 mg/dL — ABNORMAL LOW (ref 87–352)
IgG (Immunoglobin G), Serum: 654 mg/dL (ref 586–1602)
IgM (Immunoglobulin M), Srm: 171 mg/dL (ref 26–217)

## 2019-05-26 LAB — KAPPA/LAMBDA LIGHT CHAINS
Kappa free light chain: 16.1 mg/L (ref 3.3–19.4)
Kappa, lambda light chain ratio: 4.6 — ABNORMAL HIGH (ref 0.26–1.65)
Lambda free light chains: 3.5 mg/L — ABNORMAL LOW (ref 5.7–26.3)

## 2019-05-27 ENCOUNTER — Ambulatory Visit: Payer: Federal, State, Local not specified - PPO

## 2019-05-27 ENCOUNTER — Ambulatory Visit: Payer: Federal, State, Local not specified - PPO | Admitting: Hematology & Oncology

## 2019-05-27 ENCOUNTER — Other Ambulatory Visit: Payer: Federal, State, Local not specified - PPO

## 2019-05-27 LAB — PROTEIN ELECTROPHORESIS, SERUM, WITH REFLEX
A/G Ratio: 1.6 (ref 0.7–1.7)
Albumin ELP: 3.9 g/dL (ref 2.9–4.4)
Alpha-1-Globulin: 0.2 g/dL (ref 0.0–0.4)
Alpha-2-Globulin: 0.5 g/dL (ref 0.4–1.0)
Beta Globulin: 0.9 g/dL (ref 0.7–1.3)
Gamma Globulin: 0.7 g/dL (ref 0.4–1.8)
Globulin, Total: 2.4 g/dL (ref 2.2–3.9)
M-Spike, %: 0.2 g/dL — ABNORMAL HIGH
SPEP Interpretation: 0
Total Protein ELP: 6.3 g/dL (ref 6.0–8.5)

## 2019-05-27 LAB — IMMUNOFIXATION REFLEX, SERUM
IgA: 15 mg/dL — ABNORMAL LOW (ref 87–352)
IgG (Immunoglobin G), Serum: 822 mg/dL (ref 586–1602)
IgM (Immunoglobulin M), Srm: 207 mg/dL (ref 26–217)

## 2019-05-28 MED FILL — IMBRUVICA 420 MG TAB: 420 | 28 days supply | Qty: 28 | Fill #0

## 2019-06-24 MED FILL — IMBRUVICA 420 MG TAB: 420 | 28 days supply | Qty: 28 | Fill #1

## 2019-06-27 NOTE — Progress Notes (Signed)
Pharmacist Chemotherapy Monitoring - Follow Up Assessment    I verify that I have reviewed each item in the below checklist:  . Regimen for the patient is scheduled for the appropriate day and plan matches scheduled date. Marland Kitchen Appropriate non-routine labs are ordered dependent on drug ordered. . If applicable, additional medications reviewed and ordered per protocol based on lifetime cumulative doses and/or treatment regimen.   Plan for follow-up and/or issues identified: Yes . I-vent associated with next due treatment: Yes . MD and/or nursing notified: Yes  Lestine Rahe, Jacqlyn Larsen 06/27/2019 8:01 AM

## 2019-07-04 ENCOUNTER — Other Ambulatory Visit: Payer: Self-pay

## 2019-07-04 ENCOUNTER — Inpatient Hospital Stay: Payer: Federal, State, Local not specified - PPO

## 2019-07-04 ENCOUNTER — Encounter: Payer: Self-pay | Admitting: Hematology & Oncology

## 2019-07-04 ENCOUNTER — Inpatient Hospital Stay: Payer: Federal, State, Local not specified - PPO | Attending: Hematology & Oncology | Admitting: Hematology & Oncology

## 2019-07-04 VITALS — BP 142/82 | HR 80 | Temp 97.5°F | Resp 16 | Wt 115.0 lb

## 2019-07-04 VITALS — BP 116/58 | HR 81 | Resp 17

## 2019-07-04 DIAGNOSIS — J329 Chronic sinusitis, unspecified: Secondary | ICD-10-CM | POA: Diagnosis not present

## 2019-07-04 DIAGNOSIS — E538 Deficiency of other specified B group vitamins: Secondary | ICD-10-CM | POA: Diagnosis not present

## 2019-07-04 DIAGNOSIS — C880 Waldenstrom macroglobulinemia: Secondary | ICD-10-CM | POA: Insufficient documentation

## 2019-07-04 DIAGNOSIS — Z79899 Other long term (current) drug therapy: Secondary | ICD-10-CM | POA: Insufficient documentation

## 2019-07-04 DIAGNOSIS — D801 Nonfamilial hypogammaglobulinemia: Secondary | ICD-10-CM | POA: Insufficient documentation

## 2019-07-04 DIAGNOSIS — D510 Vitamin B12 deficiency anemia due to intrinsic factor deficiency: Secondary | ICD-10-CM | POA: Diagnosis not present

## 2019-07-04 DIAGNOSIS — J0101 Acute recurrent maxillary sinusitis: Secondary | ICD-10-CM

## 2019-07-04 LAB — CMP (CANCER CENTER ONLY)
ALT: 17 U/L (ref 0–44)
AST: 20 U/L (ref 15–41)
Albumin: 4.3 g/dL (ref 3.5–5.0)
Alkaline Phosphatase: 69 U/L (ref 38–126)
Anion gap: 7 (ref 5–15)
BUN: 12 mg/dL (ref 8–23)
CO2: 30 mmol/L (ref 22–32)
Calcium: 9.2 mg/dL (ref 8.9–10.3)
Chloride: 105 mmol/L (ref 98–111)
Creatinine: 0.77 mg/dL (ref 0.44–1.00)
GFR, Est AFR Am: >60 mL/min (ref >60)
GFR, Estimated: >60 mL/min (ref >60)
Glucose, Bld: 100 mg/dL — ABNORMAL HIGH (ref 70–99)
Potassium: 4.2 mmol/L (ref 3.5–5.1)
Sodium: 142 mmol/L (ref 135–145)
Total Bilirubin: 1.1 mg/dL (ref 0.3–1.2)
Total Protein: 6 g/dL — ABNORMAL LOW (ref 6.5–8.1)

## 2019-07-04 LAB — CBC WITH DIFFERENTIAL (CANCER CENTER ONLY)
Abs Immature Granulocytes: 0.11 10*3/uL — ABNORMAL HIGH (ref 0.00–0.07)
Basophils Absolute: 0.1 10*3/uL (ref 0.0–0.1)
Basophils Relative: 1 %
Eosinophils Absolute: 0.1 10*3/uL (ref 0.0–0.5)
Eosinophils Relative: 1 %
HCT: 39.6 % (ref 36.0–46.0)
Hemoglobin: 13.6 g/dL (ref 12.0–15.0)
Immature Granulocytes: 1 %
Lymphocytes Relative: 25 %
Lymphs Abs: 2.4 10*3/uL (ref 0.7–4.0)
MCH: 35.2 pg — ABNORMAL HIGH (ref 26.0–34.0)
MCHC: 34.3 g/dL (ref 30.0–36.0)
MCV: 102.6 fL — ABNORMAL HIGH (ref 80.0–100.0)
Monocytes Absolute: 0.6 10*3/uL (ref 0.1–1.0)
Monocytes Relative: 6 %
Neutro Abs: 6.2 10*3/uL (ref 1.7–7.7)
Neutrophils Relative %: 66 %
Platelet Count: 132 10*3/uL — ABNORMAL LOW (ref 150–400)
RBC: 3.86 MIL/uL — ABNORMAL LOW (ref 3.87–5.11)
RDW: 12.4 % (ref 11.5–15.5)
WBC Count: 9.5 10*3/uL (ref 4.0–10.5)
nRBC: 0 % (ref 0.0–0.2)

## 2019-07-04 MED ORDER — ACETAMINOPHEN 325 MG PO TABS
ORAL_TABLET | ORAL | Status: AC
  Filled 2019-07-04: qty 2

## 2019-07-04 MED ORDER — CYANOCOBALAMIN 1000 MCG/ML IJ SOLN
INTRAMUSCULAR | Status: AC
  Filled 2019-07-04: qty 1

## 2019-07-04 MED ORDER — ACETAMINOPHEN 325 MG PO TABS
650.0000 mg | ORAL_TABLET | Freq: Once | ORAL | Status: AC
  Administered 2019-07-04: 650 mg via ORAL

## 2019-07-04 MED ORDER — DIPHENHYDRAMINE HCL 25 MG PO CAPS
ORAL_CAPSULE | ORAL | Status: AC
  Filled 2019-07-04: qty 1

## 2019-07-04 MED ORDER — CYANOCOBALAMIN 1000 MCG/ML IJ SOLN
1000.0000 ug | Freq: Once | INTRAMUSCULAR | Status: AC
  Administered 2019-07-04: 1000 ug via INTRAMUSCULAR

## 2019-07-04 MED ORDER — IMMUNE GLOBULIN (HUMAN) 20 GM/200ML IV SOLN
40.0000 g | Freq: Once | INTRAVENOUS | Status: AC
  Administered 2019-07-04: 10:00:00 40 g via INTRAVENOUS
  Filled 2019-07-04: qty 400

## 2019-07-04 MED ORDER — DIPHENHYDRAMINE HCL 25 MG PO CAPS
25.0000 mg | ORAL_CAPSULE | Freq: Once | ORAL | Status: AC
  Administered 2019-07-04: 25 mg via ORAL

## 2019-07-04 MED ORDER — DEXTROSE 5 % IV SOLN
INTRAVENOUS | Status: DC
  Filled 2019-07-04 (×2): qty 250

## 2019-07-04 NOTE — Progress Notes (Signed)
Hematology and Oncology Follow Up Visit  Kristy [name] [id number] 1955/12/08 64 y.o. 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  She is doing quite well.  So far, she really has had no complaints.  She is enjoying her granddaughter.  She had a wonderful Easter with her.  As far as the Waldenstrm's  are concerned, everything is going quite well.  When we last saw her in March, the M spike was 0.2 g/dL.  Her IgM level was 190 mg/dL.  She has had no problems with fever.  She is still working.  She is having no rashes.  There is no change in bowel or bladder habits.  She has had no cough.  She is not yet had the coronavirus vaccine.  Overall, her performance status is ECOG 0.   Medications:  Allergies as of 07/04/2019   No Known Allergies     Medication List       Accurate as of July 04, 2019  9:46 AM. If you have any questions, ask your nurse or doctor.        cyanocobalamin 1000 MCG/ML injection Commonly known as: (VITAMIN B-12) INJECT 1 ML INTO THE MUSCLE EVERY 2 MONTHS   Imbruvica 420 MG Tabs Generic drug: Ibrutinib TAKE 1 TABLET BY MOUTH DAILY AFTER BREAKFAST.   Vitamin D3 25 MCG tablet Commonly known as: Vitamin D Take by mouth daily.   Xiidra 5 % Soln Generic drug: Lifitegrast Apply 1 drop to eye 2 (two) times daily.       Allergies: No Known Allergies  Past Medical History, Surgical history, Social history, and Family History were reviewed and updated.  Review of Systems: Review of Systems  Constitutional: Negative.   HENT: Negative.   Eyes: Negative.   Respiratory: Negative.   Cardiovascular: Negative.   Gastrointestinal: Negative.     Genitourinary: Negative.   Musculoskeletal: Negative.   Skin: Negative.   Neurological: Negative.   Endo/Heme/Allergies: Negative.   Psychiatric/Behavioral: The patient is nervous/anxious.       Physical Exam:  weight is 115 lb (52.2 kg). Her temporal temperature is 97.5 F (36.4 C) (abnormal). Her blood pressure is 142/82 (abnormal) and her pulse is 80. Her respiration is 16 and oxygen saturation is 99%.   Wt Readings from Last 3 Encounters:  07/04/19 115 lb (52.2 kg)  05/23/19 116 lb (52.6 kg)  04/15/19 116 lb 12.8 oz (53 kg)    Physical Exam Vitals reviewed.  HENT:     Head: Normocephalic and atraumatic.  Eyes:     Pupils: Pupils are equal, round, and reactive to light.  Cardiovascular:     Rate and Rhythm: Normal rate and regular rhythm.     Heart sounds: Normal heart sounds.  Pulmonary:     Effort: Pulmonary effort is normal.     Breath sounds: Normal breath sounds.  Abdominal:     General: Bowel sounds are normal.     Palpations: Abdomen is soft.  Musculoskeletal:        General: No tenderness or deformity. Normal range of motion.     Cervical back: Normal range of motion.  Lymphadenopathy:     Cervical: No cervical adenopathy.  Skin:  General: Skin is warm and dry.     Findings: No erythema or rash.  Neurological:     Mental Status: She is alert and oriented to person, place, and time.  Psychiatric:        Behavior: Behavior normal.        Thought Content: Thought content normal.        Judgment: Judgment normal.      Lab Results  Component Value Date   WBC 9.5 07/04/2019   HGB 13.6 07/04/2019   HCT 39.6 07/04/2019   MCV 102.6 (H) 07/04/2019   PLT 132 (L) 07/04/2019   No results found for: FERRITIN, IRON, TIBC, UIBC, IRONPCTSAT Lab Results  Component Value Date   RETICCTPCT 2.9 (H) 10/30/2014   RBC 3.86 (L) 07/04/2019   RETICCTABS 98.3 10/30/2014   Lab Results  Component Value Date   KPAFRELGTCHN 16.1 05/23/2019   LAMBDASER 3.5 (L)  05/23/2019   KAPLAMBRATIO 4.60 (H) 05/23/2019   Lab Results  Component Value Date   IGGSERUM 822 05/23/2019   IGA 15 (L) 05/23/2019   IGMSERUM 207 05/23/2019   Lab Results  Component Value Date   TOTALPROTELP 6.3 05/23/2019   ALBUMINELP 3.9 05/23/2019   A1GS 0.2 05/23/2019   A2GS 0.5 05/23/2019   BETS 0.9 05/23/2019   BETA2SER 0.2 02/15/2015   GAMS 0.7 05/23/2019   MSPIKE 0.2 (H) 05/23/2019   SPEI Comment 11/12/2018     Chemistry      Component Value Date/Time   NA 142 07/04/2019 0827   NA 147 (H) 03/19/2017 0849   NA 139 03/16/2016 1337   K 4.2 07/04/2019 0827   K 4.5 03/19/2017 0849   K 3.8 03/16/2016 1337   CL 105 07/04/2019 0827   CL 108 03/19/2017 0849   CO2 30 07/04/2019 0827   CO2 31 03/19/2017 0849   CO2 27 03/16/2016 1337   BUN 12 07/04/2019 0827   BUN 11 03/19/2017 0849   BUN 5.2 (L) 03/16/2016 1337   CREATININE 0.77 07/04/2019 0827   CREATININE 1.0 03/19/2017 0849   CREATININE 0.7 03/16/2016 1337      Component Value Date/Time   CALCIUM 9.2 07/04/2019 0827   CALCIUM 9.6 03/19/2017 0849   CALCIUM 8.8 03/16/2016 1337   ALKPHOS 69 07/04/2019 0827   ALKPHOS 53 03/19/2017 0849   ALKPHOS 62 03/16/2016 1337   AST 20 07/04/2019 0827   AST 24 03/16/2016 1337   ALT 17 07/04/2019 0827   ALT 23 03/19/2017 0849   ALT 19 03/16/2016 1337   BILITOT 1.1 07/04/2019 0827   BILITOT 0.57 03/16/2016 1337       Impression and Plan: Kristy [name] is a very pleasant 64 yo caucasian female with Waldenstrom's with acquired hypogammaglobulinemia and recurrent sinusitis.   We will proceed with IVIG today as planned.   She does not really need the vitamin B12 injection until May.  Hopefully, we will be able to decrease the Imbruvica dose.  I think that if we find her levels continue to stabilize with the Waldenstrm's, we will try to cut back on the Imbruvica.  We will plan to get her back in 6 more weeks.  [name], MD 4/16/20219:46 AM

## 2019-07-04 NOTE — Patient Instructions (Signed)
Immune Globulin Injection What is this medicine? IMMUNE GLOBULIN (im MUNE GLOB yoo lin) helps to prevent or reduce the severity of certain infections in patients who are at risk. This medicine is collected from the pooled blood of many donors. It is used to treat immune system problems, thrombocytopenia, and Kawasaki syndrome. This medicine may be used for other purposes; ask your health care provider or pharmacist if you have questions. COMMON BRAND NAME(S): ASCENIV, Baygam, BIVIGAM, Carimune, Carimune NF, cutaquig, Cuvitru, Flebogamma, Flebogamma DIF, GamaSTAN, GamaSTAN S/D, Gamimune N, Gammagard, Gammagard S/D, Gammaked, Gammaplex, Gammar-P IV, Gamunex, Gamunex-C, Hizentra, Iveegam, Iveegam EN, Octagam, Panglobulin, Panglobulin NF, panzyga, Polygam S/D, Privigen, Sandoglobulin, Venoglobulin-S, Vigam, Vivaglobulin, Xembify What should I tell my health care provider before I take this medicine? They need to know if you have any of these conditions:  diabetes  extremely low or no immune antibodies in the blood  heart disease  history of blood clots  hyperprolinemia  infection in the blood, sepsis  kidney disease  recently received or scheduled to receive a vaccination  an unusual or allergic reaction to human immune globulin, albumin, maltose, sucrose, other medicines, foods, dyes, or preservatives  pregnant or trying to get pregnant  breast-feeding How should I use this medicine? This medicine is for injection into a muscle or infusion into a vein or skin. It is usually given by a health care professional in a hospital or clinic setting. In rare cases, some brands of this medicine might be given at home. You will be taught how to give this medicine. Use exactly as directed. Take your medicine at regular intervals. Do not take your medicine more often than directed. Talk to your pediatrician regarding the use of this medicine in children. While this drug may be prescribed for selected  conditions, precautions do apply. Overdosage: If you think you have taken too much of this medicine contact a poison control center or emergency room at once. NOTE: This medicine is only for you. Do not share this medicine with others. What if I miss a dose? It is important not to miss your dose. Call your doctor or health care professional if you are unable to keep an appointment. If you give yourself the medicine and you miss a dose, take it as soon as you can. If it is almost time for your next dose, take only that dose. Do not take double or extra doses. What may interact with this medicine?  aspirin and aspirin-like medicines  cisplatin  cyclosporine  medicines for infection like acyclovir, adefovir, amphotericin B, bacitracin, cidofovir, foscarnet, ganciclovir, gentamicin, pentamidine, vancomycin  NSAIDS, medicines for pain and inflammation, like ibuprofen or naproxen  pamidronate  vaccines  zoledronic acid This list may not describe all possible interactions. Give your health care provider a list of all the medicines, herbs, non-prescription drugs, or dietary supplements you use. Also tell them if you smoke, drink alcohol, or use illegal drugs. Some items may interact with your medicine. What should I watch for while using this medicine? Your condition will be monitored carefully while you are receiving this medicine. This medicine is made from pooled blood donations of many different people. It may be possible to pass an infection in this medicine. However, the donors are screened for infections and all products are tested for HIV and hepatitis. The medicine is treated to kill most or all bacteria and viruses. Talk to your doctor about the risks and benefits of this medicine. Do not have vaccinations for at least   14 days before, or until at least 3 months after receiving this medicine. What side effects may I notice from receiving this medicine? Side effects that you should report  to your doctor or health care professional as soon as possible:  allergic reactions like skin rash, itching or hives, swelling of the face, lips, or tongue  blue colored lips or skin  breathing problems  chest pain or tightness  fever  signs and symptoms of aseptic meningitis such as stiff neck; sensitivity to light; headache; drowsiness; fever; nausea; vomiting; rash  signs and symptoms of a blood clot such as chest pain; shortness of breath; pain, swelling, or warmth in the leg  signs and symptoms of hemolytic anemia such as fast heartbeat; tiredness; dark yellow or brown urine; or yellowing of the eyes or skin  signs and symptoms of kidney injury like trouble passing urine or change in the amount of urine  sudden weight gain  swelling of the ankles, feet, hands Side effects that usually do not require medical attention (report to your doctor or health care professional if they continue or are bothersome):  diarrhea  flushing  headache  increased sweating  joint pain  muscle cramps  muscle pain  nausea  pain, redness, or irritation at site where injected  tiredness This list may not describe all possible side effects. Call your doctor for medical advice about side effects. You may report side effects to FDA at 1-800-FDA-1088. Where should I keep my medicine? Keep out of the reach of children. This drug is usually given in a hospital or clinic and will not be stored at home. In rare cases, some brands of this medicine may be given at home. If you are using this medicine at home, you will be instructed on how to store this medicine. Throw away any unused medicine after the expiration date on the label. NOTE: This sheet is a summary. It may not cover all possible information. If you have questions about this medicine, talk to your doctor, pharmacist, or health care provider.  2020 Elsevier/Gold Standard (2018-10-09 12:51:14)  

## 2019-07-05 LAB — IGG, IGA, IGM
IgA: 10 mg/dL — ABNORMAL LOW (ref 87–352)
IgG (Immunoglobin G), Serum: 628 mg/dL (ref 586–1602)
IgM (Immunoglobulin M), Srm: 172 mg/dL (ref 26–217)

## 2019-07-07 LAB — KAPPA/LAMBDA LIGHT CHAINS
Kappa free light chain: 15.4 mg/L (ref 3.3–19.4)
Kappa, lambda light chain ratio: 4.67 — ABNORMAL HIGH (ref 0.26–1.65)
Lambda free light chains: 3.3 mg/L — ABNORMAL LOW (ref 5.7–26.3)

## 2019-07-11 LAB — PROTEIN ELECTROPHORESIS, SERUM, WITH REFLEX
A/G Ratio: 1.7 (ref 0.7–1.7)
Albumin ELP: 4 g/dL (ref 2.9–4.4)
Alpha-1-Globulin: 0.2 g/dL (ref 0.0–0.4)
Alpha-2-Globulin: 0.6 g/dL (ref 0.4–1.0)
Beta Globulin: 0.9 g/dL (ref 0.7–1.3)
Gamma Globulin: 0.6 g/dL (ref 0.4–1.8)
Globulin, Total: 2.3 g/dL (ref 2.2–3.9)
M-Spike, %: 0.2 g/dL — ABNORMAL HIGH
SPEP Interpretation: 0
Total Protein ELP: 6.3 g/dL (ref 6.0–8.5)

## 2019-07-11 LAB — IMMUNOFIXATION REFLEX, SERUM
IgA: 14 mg/dL — ABNORMAL LOW (ref 87–352)
IgG (Immunoglobin G), Serum: 760 mg/dL (ref 586–1602)
IgM (Immunoglobulin M), Srm: 188 mg/dL (ref 26–217)

## 2019-07-21 MED FILL — IMBRUVICA 420 MG TAB: 420 | 28 days supply | Qty: 28 | Fill #2

## 2019-08-07 NOTE — Progress Notes (Signed)
Pharmacist Chemotherapy Monitoring - Follow Up Assessment    I verify that I have reviewed each item in the below checklist:  . Regimen for the patient is scheduled for the appropriate day and plan matches scheduled date. Marland Kitchen Appropriate non-routine labs are ordered dependent on drug ordered. . If applicable, additional medications reviewed and ordered per protocol based on lifetime cumulative doses and/or treatment regimen.   Plan for follow-up and/or issues identified: No . I-vent associated with next due treatment: No . MD and/or nursing notified: No  Kristy [name], Kristy Kristy [name] 08/07/2019 11:00 AM

## 2019-08-14 ENCOUNTER — Inpatient Hospital Stay: Payer: Federal, State, Local not specified - PPO | Attending: Hematology & Oncology | Admitting: Hematology & Oncology

## 2019-08-14 ENCOUNTER — Inpatient Hospital Stay: Payer: Federal, State, Local not specified - PPO

## 2019-08-14 ENCOUNTER — Other Ambulatory Visit: Payer: Self-pay

## 2019-08-14 ENCOUNTER — Telehealth: Payer: Self-pay | Admitting: Hematology & Oncology

## 2019-08-14 VITALS — BP 138/71 | HR 78 | Temp 97.5°F | Resp 20 | Wt 115.8 lb

## 2019-08-14 DIAGNOSIS — D510 Vitamin B12 deficiency anemia due to intrinsic factor deficiency: Secondary | ICD-10-CM | POA: Insufficient documentation

## 2019-08-14 DIAGNOSIS — C880 Waldenstrom macroglobulinemia: Secondary | ICD-10-CM

## 2019-08-14 DIAGNOSIS — D801 Nonfamilial hypogammaglobulinemia: Secondary | ICD-10-CM | POA: Diagnosis not present

## 2019-08-14 DIAGNOSIS — J329 Chronic sinusitis, unspecified: Secondary | ICD-10-CM | POA: Insufficient documentation

## 2019-08-14 DIAGNOSIS — J0101 Acute recurrent maxillary sinusitis: Secondary | ICD-10-CM

## 2019-08-14 LAB — CBC WITH DIFFERENTIAL (CANCER CENTER ONLY)
Abs Immature Granulocytes: 0.11 10*3/uL — ABNORMAL HIGH (ref 0.00–0.07)
Basophils Absolute: 0 10*3/uL (ref 0.0–0.1)
Basophils Relative: 0 %
Eosinophils Absolute: 0.1 10*3/uL (ref 0.0–0.5)
Eosinophils Relative: 1 %
HCT: 38.4 % (ref 36.0–46.0)
Hemoglobin: 13.1 g/dL (ref 12.0–15.0)
Immature Granulocytes: 2 %
Lymphocytes Relative: 26 %
Lymphs Abs: 1.9 10*3/uL (ref 0.7–4.0)
MCH: 35.7 pg — ABNORMAL HIGH (ref 26.0–34.0)
MCHC: 34.1 g/dL (ref 30.0–36.0)
MCV: 104.6 fL — ABNORMAL HIGH (ref 80.0–100.0)
Monocytes Absolute: 0.6 10*3/uL (ref 0.1–1.0)
Monocytes Relative: 8 %
Neutro Abs: 4.5 10*3/uL (ref 1.7–7.7)
Neutrophils Relative %: 63 %
Platelet Count: 105 10*3/uL — ABNORMAL LOW (ref 150–400)
RBC: 3.67 MIL/uL — ABNORMAL LOW (ref 3.87–5.11)
RDW: 12.2 % (ref 11.5–15.5)
WBC Count: 7.2 10*3/uL (ref 4.0–10.5)
nRBC: 0 % (ref 0.0–0.2)

## 2019-08-14 LAB — CMP (CANCER CENTER ONLY)
ALT: 19 U/L (ref 0–44)
AST: 21 U/L (ref 15–41)
Albumin: 4.1 g/dL (ref 3.5–5.0)
Alkaline Phosphatase: 53 U/L (ref 38–126)
Anion gap: 7 (ref 5–15)
BUN: 10 mg/dL (ref 8–23)
CO2: 28 mmol/L (ref 22–32)
Calcium: 9 mg/dL (ref 8.9–10.3)
Chloride: 105 mmol/L (ref 98–111)
Creatinine: 0.73 mg/dL (ref 0.44–1.00)
GFR, Est AFR Am: >60 mL/min (ref >60)
GFR, Estimated: >60 mL/min (ref >60)
Glucose, Bld: 97 mg/dL (ref 70–99)
Potassium: 4.3 mmol/L (ref 3.5–5.1)
Sodium: 140 mmol/L (ref 135–145)
Total Bilirubin: 1.1 mg/dL (ref 0.3–1.2)
Total Protein: 6.3 g/dL — ABNORMAL LOW (ref 6.5–8.1)

## 2019-08-14 MED ORDER — DIPHENHYDRAMINE HCL 25 MG PO CAPS
25.0000 mg | ORAL_CAPSULE | Freq: Once | ORAL | Status: AC
  Administered 2019-08-14: 25 mg via ORAL

## 2019-08-14 MED ORDER — DIPHENHYDRAMINE HCL 25 MG PO CAPS
ORAL_CAPSULE | ORAL | Status: AC
  Filled 2019-08-14: qty 2

## 2019-08-14 MED ORDER — CYANOCOBALAMIN 1000 MCG/ML IJ SOLN
INTRAMUSCULAR | Status: AC
  Filled 2019-08-14: qty 1

## 2019-08-14 MED ORDER — IMMUNE GLOBULIN (HUMAN) 20 GM/200ML IV SOLN
40.0000 g | Freq: Once | INTRAVENOUS | Status: AC
  Administered 2019-08-14: 40 g via INTRAVENOUS
  Filled 2019-08-14: qty 400

## 2019-08-14 MED ORDER — CYANOCOBALAMIN 1000 MCG/ML IJ SOLN
1000.0000 ug | Freq: Once | INTRAMUSCULAR | Status: AC
  Administered 2019-08-14: 1000 ug via INTRAMUSCULAR

## 2019-08-14 MED ORDER — ACETAMINOPHEN 325 MG PO TABS
650.0000 mg | ORAL_TABLET | Freq: Once | ORAL | Status: AC
  Administered 2019-08-14: 650 mg via ORAL

## 2019-08-14 MED ORDER — DEXTROSE 5 % IV SOLN
INTRAVENOUS | Status: DC
  Filled 2019-08-14: qty 250

## 2019-08-14 MED ORDER — ACETAMINOPHEN 325 MG PO TABS
ORAL_TABLET | ORAL | Status: AC
  Filled 2019-08-14: qty 2

## 2019-08-14 NOTE — Patient Instructions (Signed)
Immune Globulin Injection What is this medicine? IMMUNE GLOBULIN (im MUNE GLOB yoo lin) helps to prevent or reduce the severity of certain infections in patients who are at risk. This medicine is collected from the pooled blood of many donors. It is used to treat immune system problems, thrombocytopenia, and Kawasaki syndrome. This medicine may be used for other purposes; ask your health care provider or pharmacist if you have questions. COMMON BRAND NAME(S): ASCENIV, Baygam, BIVIGAM, Carimune, Carimune NF, cutaquig, Cuvitru, Flebogamma, Flebogamma DIF, GamaSTAN, GamaSTAN S/D, Gamimune N, Gammagard, Gammagard S/D, Gammaked, Gammaplex, Gammar-P IV, Gamunex, Gamunex-C, Hizentra, Iveegam, Iveegam EN, Octagam, Panglobulin, Panglobulin NF, panzyga, Polygam S/D, Privigen, Sandoglobulin, Venoglobulin-S, Vigam, Vivaglobulin, Xembify What should I tell my health care provider before I take this medicine? They need to know if you have any of these conditions:  diabetes  extremely low or no immune antibodies in the blood  heart disease  history of blood clots  hyperprolinemia  infection in the blood, sepsis  kidney disease  recently received or scheduled to receive a vaccination  an unusual or allergic reaction to human immune globulin, albumin, maltose, sucrose, other medicines, foods, dyes, or preservatives  pregnant or trying to get pregnant  breast-feeding How should I use this medicine? This medicine is for injection into a muscle or infusion into a vein or skin. It is usually given by a health care professional in a hospital or clinic setting. In rare cases, some brands of this medicine might be given at home. You will be taught how to give this medicine. Use exactly as directed. Take your medicine at regular intervals. Do not take your medicine more often than directed. Talk to your pediatrician regarding the use of this medicine in children. While this drug may be prescribed for selected  conditions, precautions do apply. Overdosage: If you think you have taken too much of this medicine contact a poison control center or emergency room at once. NOTE: This medicine is only for you. Do not share this medicine with others. What if I miss a dose? It is important not to miss your dose. Call your doctor or health care professional if you are unable to keep an appointment. If you give yourself the medicine and you miss a dose, take it as soon as you can. If it is almost time for your next dose, take only that dose. Do not take double or extra doses. What may interact with this medicine?  aspirin and aspirin-like medicines  cisplatin  cyclosporine  medicines for infection like acyclovir, adefovir, amphotericin B, bacitracin, cidofovir, foscarnet, ganciclovir, gentamicin, pentamidine, vancomycin  NSAIDS, medicines for pain and inflammation, like ibuprofen or naproxen  pamidronate  vaccines  zoledronic acid This list may not describe all possible interactions. Give your health care provider a list of all the medicines, herbs, non-prescription drugs, or dietary supplements you use. Also tell them if you smoke, drink alcohol, or use illegal drugs. Some items may interact with your medicine. What should I watch for while using this medicine? Your condition will be monitored carefully while you are receiving this medicine. This medicine is made from pooled blood donations of many different people. It may be possible to pass an infection in this medicine. However, the donors are screened for infections and all products are tested for HIV and hepatitis. The medicine is treated to kill most or all bacteria and viruses. Talk to your doctor about the risks and benefits of this medicine. Do not have vaccinations for at least   14 days before, or until at least 3 months after receiving this medicine. What side effects may I notice from receiving this medicine? Side effects that you should report  to your doctor or health care professional as soon as possible:  allergic reactions like skin rash, itching or hives, swelling of the face, lips, or tongue  blue colored lips or skin  breathing problems  chest pain or tightness  fever  signs and symptoms of aseptic meningitis such as stiff neck; sensitivity to light; headache; drowsiness; fever; nausea; vomiting; rash  signs and symptoms of a blood clot such as chest pain; shortness of breath; pain, swelling, or warmth in the leg  signs and symptoms of hemolytic anemia such as fast heartbeat; tiredness; dark yellow or brown urine; or yellowing of the eyes or skin  signs and symptoms of kidney injury like trouble passing urine or change in the amount of urine  sudden weight gain  swelling of the ankles, feet, hands Side effects that usually do not require medical attention (report to your doctor or health care professional if they continue or are bothersome):  diarrhea  flushing  headache  increased sweating  joint pain  muscle cramps  muscle pain  nausea  pain, redness, or irritation at site where injected  tiredness This list may not describe all possible side effects. Call your doctor for medical advice about side effects. You may report side effects to FDA at 1-800-FDA-1088. Where should I keep my medicine? Keep out of the reach of children. This drug is usually given in a hospital or clinic and will not be stored at home. In rare cases, some brands of this medicine may be given at home. If you are using this medicine at home, you will be instructed on how to store this medicine. Throw away any unused medicine after the expiration date on the label. NOTE: This sheet is a summary. It may not cover all possible information. If you have questions about this medicine, talk to your doctor, pharmacist, or health care provider.  2020 Elsevier/Gold Standard (2018-10-09 12:51:14)  

## 2019-08-14 NOTE — Telephone Encounter (Signed)
Appointments scheduled calendar printed per 5/27 los

## 2019-08-14 NOTE — Progress Notes (Signed)
Hematology and Oncology Follow Up Visit  Kristy [name]:[id number] 12-21-1955 64 y.o. 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  She is doing quite well.  So far, she really has had no complaints.  She is enjoying her granddaughter.  She is looking forward to a nice Memorial Day weekend with her.  .  As far as the Waldenstrm's  are concerned, everything is going quite well.  When we last saw her in April, the M spike was 0.2 g/dL.  Her IgM level was 180 mg/dL.  She has had no problems with fever.  She is still working.  She is having no rashes.  She is still having some sinus issues.  The pollen definitely causes her sinuses to flareup.  There is no change in bowel or bladder habits.  She has had no cough.  She is not yet had the coronavirus vaccine.  She says that she will get the vaccine.  She is having no problems at work.  Overall, her performance status is ECOG 0.   Medications:  Allergies as of 08/14/2019   No Known Allergies     Medication List       Accurate as of Aug 14, 2019  8:14 AM. If you have any questions, ask your nurse or doctor.        cyanocobalamin 1000 MCG/ML injection Commonly known as: (VITAMIN B-12) INJECT 1 ML INTO THE MUSCLE EVERY 2 MONTHS   Imbruvica 420 MG Tabs Generic drug: Ibrutinib TAKE 1 TABLET BY MOUTH DAILY AFTER BREAKFAST.   Vitamin D3 25 MCG tablet Commonly known as: Vitamin D Take by mouth daily.   Xiidra 5 % Soln Generic drug: Lifitegrast Apply 1 drop to eye 2 (two) times daily.       Allergies: No Known Allergies  Past Medical History, Surgical history, Social history, and Family History were reviewed and  updated.  Review of Systems: Review of Systems  Constitutional: Negative.   HENT: Negative.   Eyes: Negative.   Respiratory: Negative.   Cardiovascular: Negative.   Gastrointestinal: Negative.   Genitourinary: Negative.   Musculoskeletal: Negative.   Skin: Negative.   Neurological: Negative.   Endo/Heme/Allergies: Negative.   Psychiatric/Behavioral: The patient is nervous/anxious.       Physical Exam:  vitals were not taken for this visit.   Wt Readings from Last 3 Encounters:  07/04/19 115 lb (52.2 kg)  05/23/19 116 lb (52.6 kg)  04/15/19 116 lb 12.8 oz (53 kg)    Physical Exam Vitals reviewed.  HENT:     Head: Normocephalic and atraumatic.  Eyes:     Pupils: Pupils are equal, round, and reactive to light.  Cardiovascular:     Rate and Rhythm: Normal rate and regular rhythm.     Heart sounds: Normal heart sounds.  Pulmonary:     Effort: Pulmonary effort is normal.     Breath sounds: Normal breath sounds.  Abdominal:     General: Bowel sounds are normal.     Palpations: Abdomen is soft.  Musculoskeletal:        General: No tenderness or deformity. Normal range of motion.     Cervical back: Normal range of motion.  Lymphadenopathy:  Cervical: No cervical adenopathy.  Skin:    General: Skin is warm and dry.     Findings: No erythema or rash.  Neurological:     Mental Status: She is alert and oriented to person, place, and time.  Psychiatric:        Behavior: Behavior normal.        Thought Content: Thought content normal.        Judgment: Judgment normal.      Lab Results  Component Value Date   WBC 7.2 08/14/2019   HGB 13.1 08/14/2019   HCT 38.4 08/14/2019   MCV 104.6 (H) 08/14/2019   PLT 105 (L) 08/14/2019   No results found for: FERRITIN, IRON, TIBC, UIBC, IRONPCTSAT Lab Results  Component Value Date   RETICCTPCT 2.9 (H) 10/30/2014   RBC 3.67 (L) 08/14/2019   RETICCTABS 98.3 10/30/2014   Lab Results  Component Value Date   KPAFRELGTCHN  15.4 07/04/2019   LAMBDASER 3.3 (L) 07/04/2019   KAPLAMBRATIO 4.67 (H) 07/04/2019   Lab Results  Component Value Date   IGGSERUM 628 07/04/2019   IGGSERUM 760 07/04/2019   IGA 10 (L) 07/04/2019   IGA 14 (L) 07/04/2019   IGMSERUM 172 07/04/2019   IGMSERUM 188 07/04/2019   Lab Results  Component Value Date   TOTALPROTELP 6.3 07/04/2019   ALBUMINELP 4.0 07/04/2019   A1GS 0.2 07/04/2019   A2GS 0.6 07/04/2019   BETS 0.9 07/04/2019   BETA2SER 0.2 02/15/2015   GAMS 0.6 07/04/2019   MSPIKE 0.2 (H) 07/04/2019   SPEI Comment 11/12/2018     Chemistry      Component Value Date/Time   NA 142 07/04/2019 0827   NA 147 (H) 03/19/2017 0849   NA 139 03/16/2016 1337   K 4.2 07/04/2019 0827   K 4.5 03/19/2017 0849   K 3.8 03/16/2016 1337   CL 105 07/04/2019 0827   CL 108 03/19/2017 0849   CO2 30 07/04/2019 0827   CO2 31 03/19/2017 0849   CO2 27 03/16/2016 1337   BUN 12 07/04/2019 0827   BUN 11 03/19/2017 0849   BUN 5.2 (L) 03/16/2016 1337   CREATININE 0.77 07/04/2019 0827   CREATININE 1.0 03/19/2017 0849   CREATININE 0.7 03/16/2016 1337      Component Value Date/Time   CALCIUM 9.2 07/04/2019 0827   CALCIUM 9.6 03/19/2017 0849   CALCIUM 8.8 03/16/2016 1337   ALKPHOS 69 07/04/2019 0827   ALKPHOS 53 03/19/2017 0849   ALKPHOS 62 03/16/2016 1337   AST 20 07/04/2019 0827   AST 24 03/16/2016 1337   ALT 17 07/04/2019 0827   ALT 23 03/19/2017 0849   ALT 19 03/16/2016 1337   BILITOT 1.1 07/04/2019 0827   BILITOT 0.57 03/16/2016 1337       Impression and Plan: Kristy [name] is a very pleasant 64 yo caucasian female with Waldenstrom's with acquired hypogammaglobulinemia and recurrent sinusitis.   We will proceed with IVIG today as planned.   She will get her vitamin B12 today.  She likes having it done in the office.  For right now, I will keep the Imbruvica dose the same.  We will plan to get her back in 6 more weeks.  [name], MD 5/27/20218:14 AM

## 2019-08-15 LAB — IGG, IGA, IGM
IgA: 12 mg/dL — ABNORMAL LOW (ref 87–352)
IgG (Immunoglobin G), Serum: 604 mg/dL (ref 586–1602)
IgM (Immunoglobulin M), Srm: 145 mg/dL (ref 26–217)

## 2019-08-15 LAB — KAPPA/LAMBDA LIGHT CHAINS
Kappa free light chain: 14.6 mg/L (ref 3.3–19.4)
Kappa, lambda light chain ratio: 5.41 — ABNORMAL HIGH (ref 0.26–1.65)
Lambda free light chains: 2.7 mg/L — ABNORMAL LOW (ref 5.7–26.3)

## 2019-08-19 LAB — PROTEIN ELECTROPHORESIS, SERUM, WITH REFLEX
A/G Ratio: 1.6 (ref 0.7–1.7)
Albumin ELP: 3.7 g/dL (ref 2.9–4.4)
Alpha-1-Globulin: 0.2 g/dL (ref 0.0–0.4)
Alpha-2-Globulin: 0.6 g/dL (ref 0.4–1.0)
Beta Globulin: 0.9 g/dL (ref 0.7–1.3)
Gamma Globulin: 0.7 g/dL (ref 0.4–1.8)
Globulin, Total: 2.3 g/dL (ref 2.2–3.9)
M-Spike, %: 0.2 g/dL — ABNORMAL HIGH
SPEP Interpretation: 0
Total Protein ELP: 6 g/dL (ref 6.0–8.5)

## 2019-08-19 MED FILL — IMBRUVICA 420 MG TAB: 420 | 28 days supply | Qty: 28 | Fill #3

## 2019-09-15 MED FILL — IMBRUVICA 420 MG TAB: 420 | 28 days supply | Qty: 28 | Fill #4

## 2019-09-24 ENCOUNTER — Telehealth: Payer: Self-pay

## 2019-09-24 NOTE — Telephone Encounter (Signed)
Patient called and left a message about covid antibody testing being done when she comes in.   Called patient back and left her a message informing her Dr.Ennever does not do the antibody testing here and she can call PCP if she would like this testing.

## 2019-09-26 ENCOUNTER — Inpatient Hospital Stay (HOSPITAL_BASED_OUTPATIENT_CLINIC_OR_DEPARTMENT_OTHER): Payer: Federal, State, Local not specified - PPO | Admitting: Hematology & Oncology

## 2019-09-26 ENCOUNTER — Inpatient Hospital Stay: Payer: Federal, State, Local not specified - PPO

## 2019-09-26 ENCOUNTER — Inpatient Hospital Stay: Payer: Federal, State, Local not specified - PPO | Attending: Hematology & Oncology

## 2019-09-26 ENCOUNTER — Other Ambulatory Visit: Payer: Self-pay

## 2019-09-26 ENCOUNTER — Telehealth: Payer: Self-pay | Admitting: Hematology & Oncology

## 2019-09-26 ENCOUNTER — Encounter: Payer: Self-pay | Admitting: Hematology & Oncology

## 2019-09-26 VITALS — BP 146/66 | HR 79 | Temp 97.8°F | Resp 16 | Wt 115.0 lb

## 2019-09-26 DIAGNOSIS — J0101 Acute recurrent maxillary sinusitis: Secondary | ICD-10-CM

## 2019-09-26 DIAGNOSIS — C880 Waldenstrom macroglobulinemia: Secondary | ICD-10-CM

## 2019-09-26 DIAGNOSIS — D510 Vitamin B12 deficiency anemia due to intrinsic factor deficiency: Secondary | ICD-10-CM | POA: Diagnosis not present

## 2019-09-26 DIAGNOSIS — J329 Chronic sinusitis, unspecified: Secondary | ICD-10-CM | POA: Diagnosis not present

## 2019-09-26 DIAGNOSIS — D801 Nonfamilial hypogammaglobulinemia: Secondary | ICD-10-CM | POA: Diagnosis not present

## 2019-09-26 LAB — CMP (CANCER CENTER ONLY)
ALT: 13 U/L (ref 0–44)
AST: 19 U/L (ref 15–41)
Albumin: 4.2 g/dL (ref 3.5–5.0)
Alkaline Phosphatase: 56 U/L (ref 38–126)
Anion gap: 7 (ref 5–15)
BUN: 8 mg/dL (ref 8–23)
CO2: 29 mmol/L (ref 22–32)
Calcium: 8.8 mg/dL — ABNORMAL LOW (ref 8.9–10.3)
Chloride: 106 mmol/L (ref 98–111)
Creatinine: 0.75 mg/dL (ref 0.44–1.00)
GFR, Est AFR Am: >60 mL/min (ref >60)
GFR, Estimated: >60 mL/min (ref >60)
Glucose, Bld: 93 mg/dL (ref 70–99)
Potassium: 4.3 mmol/L (ref 3.5–5.1)
Sodium: 142 mmol/L (ref 135–145)
Total Bilirubin: 0.8 mg/dL (ref 0.3–1.2)
Total Protein: 6 g/dL — ABNORMAL LOW (ref 6.5–8.1)

## 2019-09-26 LAB — CBC WITH DIFFERENTIAL (CANCER CENTER ONLY)
Abs Immature Granulocytes: 0.22 10*3/uL — ABNORMAL HIGH (ref 0.00–0.07)
Basophils Absolute: 0 10*3/uL (ref 0.0–0.1)
Basophils Relative: 0 %
Eosinophils Absolute: 0.1 10*3/uL (ref 0.0–0.5)
Eosinophils Relative: 1 %
HCT: 38.4 % (ref 36.0–46.0)
Hemoglobin: 13 g/dL (ref 12.0–15.0)
Immature Granulocytes: 3 %
Lymphocytes Relative: 29 %
Lymphs Abs: 2.1 10*3/uL (ref 0.7–4.0)
MCH: 35 pg — ABNORMAL HIGH (ref 26.0–34.0)
MCHC: 33.9 g/dL (ref 30.0–36.0)
MCV: 103.5 fL — ABNORMAL HIGH (ref 80.0–100.0)
Monocytes Absolute: 0.5 10*3/uL (ref 0.1–1.0)
Monocytes Relative: 7 %
Neutro Abs: 4.4 10*3/uL (ref 1.7–7.7)
Neutrophils Relative %: 60 %
Platelet Count: 120 10*3/uL — ABNORMAL LOW (ref 150–400)
RBC: 3.71 MIL/uL — ABNORMAL LOW (ref 3.87–5.11)
RDW: 12 % (ref 11.5–15.5)
WBC Count: 7.2 10*3/uL (ref 4.0–10.5)
nRBC: 0 % (ref 0.0–0.2)

## 2019-09-26 LAB — VITAMIN B12: Vitamin B-12: 190 pg/mL (ref 180–914)

## 2019-09-26 MED ORDER — DIPHENHYDRAMINE HCL 25 MG PO CAPS
25.0000 mg | ORAL_CAPSULE | Freq: Once | ORAL | Status: AC
  Administered 2019-09-26: 25 mg via ORAL

## 2019-09-26 MED ORDER — ACETAMINOPHEN 325 MG PO TABS
ORAL_TABLET | ORAL | Status: AC
  Filled 2019-09-26: qty 2

## 2019-09-26 MED ORDER — ACETAMINOPHEN 325 MG PO TABS
650.0000 mg | ORAL_TABLET | Freq: Once | ORAL | Status: AC
  Administered 2019-09-26: 650 mg via ORAL

## 2019-09-26 MED ORDER — CYANOCOBALAMIN 1000 MCG/ML IJ SOLN
INTRAMUSCULAR | Status: AC
  Filled 2019-09-26: qty 1

## 2019-09-26 MED ORDER — CYANOCOBALAMIN 1000 MCG/ML IJ SOLN
1000.0000 ug | Freq: Once | INTRAMUSCULAR | Status: AC
  Administered 2019-09-26: 1000 ug via INTRAMUSCULAR

## 2019-09-26 MED ORDER — DEXTROSE 5 % IV SOLN
INTRAVENOUS | Status: DC
  Filled 2019-09-26 (×2): qty 250

## 2019-09-26 MED ORDER — DEXTROSE 5 % IV SOLN
Freq: Once | INTRAVENOUS | Status: AC
  Filled 2019-09-26: qty 250

## 2019-09-26 MED ORDER — IMMUNE GLOBULIN (HUMAN) 20 GM/200ML IV SOLN
40.0000 g | Freq: Once | INTRAVENOUS | Status: AC
  Administered 2019-09-26: 40 g via INTRAVENOUS
  Filled 2019-09-26: qty 400

## 2019-09-26 MED ORDER — DIPHENHYDRAMINE HCL 25 MG PO CAPS
ORAL_CAPSULE | ORAL | Status: AC
  Filled 2019-09-26: qty 1

## 2019-09-26 NOTE — Telephone Encounter (Signed)
Appointments scheduled calendar printed per 7/9 los

## 2019-09-26 NOTE — Patient Instructions (Signed)
Immune Globulin Injection What is this medicine? IMMUNE GLOBULIN (im MUNE GLOB yoo lin) helps to prevent or reduce the severity of certain infections in patients who are at risk. This medicine is collected from the pooled blood of many donors. It is used to treat immune system problems, thrombocytopenia, and Kawasaki syndrome. This medicine may be used for other purposes; ask your health care provider or pharmacist if you have questions. COMMON BRAND NAME(S): ASCENIV, Baygam, BIVIGAM, Carimune, Carimune NF, cutaquig, Cuvitru, Flebogamma, Flebogamma DIF, GamaSTAN, GamaSTAN S/D, Gamimune N, Gammagard, Gammagard S/D, Gammaked, Gammaplex, Gammar-P IV, Gamunex, Gamunex-C, Hizentra, Iveegam, Iveegam EN, Octagam, Panglobulin, Panglobulin NF, panzyga, Polygam S/D, Privigen, Sandoglobulin, Venoglobulin-S, Vigam, Vivaglobulin, Xembify What should I tell my health care provider before I take this medicine? They need to know if you have any of these conditions:  diabetes  extremely low or no immune antibodies in the blood  heart disease  history of blood clots  hyperprolinemia  infection in the blood, sepsis  kidney disease  recently received or scheduled to receive a vaccination  an unusual or allergic reaction to human immune globulin, albumin, maltose, sucrose, other medicines, foods, dyes, or preservatives  pregnant or trying to get pregnant  breast-feeding How should I use this medicine? This medicine is for injection into a muscle or infusion into a vein or skin. It is usually given by a health care professional in a hospital or clinic setting. In rare cases, some brands of this medicine might be given at home. You will be taught how to give this medicine. Use exactly as directed. Take your medicine at regular intervals. Do not take your medicine more often than directed. Talk to your pediatrician regarding the use of this medicine in children. While this drug may be prescribed for selected  conditions, precautions do apply. Overdosage: If you think you have taken too much of this medicine contact a poison control center or emergency room at once. NOTE: This medicine is only for you. Do not share this medicine with others. What if I miss a dose? It is important not to miss your dose. Call your doctor or health care professional if you are unable to keep an appointment. If you give yourself the medicine and you miss a dose, take it as soon as you can. If it is almost time for your next dose, take only that dose. Do not take double or extra doses. What may interact with this medicine?  aspirin and aspirin-like medicines  cisplatin  cyclosporine  medicines for infection like acyclovir, adefovir, amphotericin B, bacitracin, cidofovir, foscarnet, ganciclovir, gentamicin, pentamidine, vancomycin  NSAIDS, medicines for pain and inflammation, like ibuprofen or naproxen  pamidronate  vaccines  zoledronic acid This list may not describe all possible interactions. Give your health care provider a list of all the medicines, herbs, non-prescription drugs, or dietary supplements you use. Also tell them if you smoke, drink alcohol, or use illegal drugs. Some items may interact with your medicine. What should I watch for while using this medicine? Your condition will be monitored carefully while you are receiving this medicine. This medicine is made from pooled blood donations of many different people. It may be possible to pass an infection in this medicine. However, the donors are screened for infections and all products are tested for HIV and hepatitis. The medicine is treated to kill most or all bacteria and viruses. Talk to your doctor about the risks and benefits of this medicine. Do not have vaccinations for at least   14 days before, or until at least 3 months after receiving this medicine. What side effects may I notice from receiving this medicine? Side effects that you should report  to your doctor or health care professional as soon as possible:  allergic reactions like skin rash, itching or hives, swelling of the face, lips, or tongue  blue colored lips or skin  breathing problems  chest pain or tightness  fever  signs and symptoms of aseptic meningitis such as stiff neck; sensitivity to light; headache; drowsiness; fever; nausea; vomiting; rash  signs and symptoms of a blood clot such as chest pain; shortness of breath; pain, swelling, or warmth in the leg  signs and symptoms of hemolytic anemia such as fast heartbeat; tiredness; dark yellow or brown urine; or yellowing of the eyes or skin  signs and symptoms of kidney injury like trouble passing urine or change in the amount of urine  sudden weight gain  swelling of the ankles, feet, hands Side effects that usually do not require medical attention (report to your doctor or health care professional if they continue or are bothersome):  diarrhea  flushing  headache  increased sweating  joint pain  muscle cramps  muscle pain  nausea  pain, redness, or irritation at site where injected  tiredness This list may not describe all possible side effects. Call your doctor for medical advice about side effects. You may report side effects to FDA at 1-800-FDA-1088. Where should I keep my medicine? Keep out of the reach of children. This drug is usually given in a hospital or clinic and will not be stored at home. In rare cases, some brands of this medicine may be given at home. If you are using this medicine at home, you will be instructed on how to store this medicine. Throw away any unused medicine after the expiration date on the label. NOTE: This sheet is a summary. It may not cover all possible information. If you have questions about this medicine, talk to your doctor, pharmacist, or health care provider.  2020 Elsevier/Gold Standard (2018-10-09 12:51:14)  

## 2019-09-26 NOTE — Progress Notes (Signed)
Hematology and Oncology Follow Up Visit  Kristy [name] [id number] 1955/06/30 64 y.o. 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  She is doing quite well.  So far, she really has had no complaints.  She does have some problems with her sinuses.  She thinks this is from the [place].  She had a very nice July 4 holiday.  She was with her family.  That a nice cookout.    As far as the Waldenstrm's  are concerned, everything is going quite well.  When we last saw her in May, the M spike was 0.2 g/dL.  Her IgM level was 145 mg/dL.  She has had no problems with fever.  She is still working.  She is having no rashes.    She is not yet had the coronavirus vaccine.  She says that she will get the vaccine.  She is having no problems at work.  Overall, her performance status is ECOG 0.   Medications:  Allergies as of 09/26/2019   No Known Allergies     Medication List       Accurate as of September 26, 2019  8:35 AM. If you have any questions, ask your nurse or doctor.        cyanocobalamin 1000 MCG/ML injection Commonly known as: (VITAMIN B-12) INJECT 1 ML INTO THE MUSCLE EVERY 2 MONTHS   Imbruvica 420 MG Tabs Generic drug: Ibrutinib TAKE 1 TABLET BY MOUTH DAILY AFTER BREAKFAST.   Vitamin D3 25 MCG tablet Commonly known as: Vitamin D Take by mouth daily.   Xiidra 5 % Soln Generic drug: Lifitegrast Apply 1 drop to eye 2 (two) times daily.       Allergies: No Known Allergies  Past Medical History, Surgical history, Social history, and Family History were reviewed and updated.  Review of Systems: Review of Systems  Constitutional: Negative.   HENT: Negative.     Eyes: Negative.   Respiratory: Negative.   Cardiovascular: Negative.   Gastrointestinal: Negative.   Genitourinary: Negative.   Musculoskeletal: Negative.   Skin: Negative.   Neurological: Negative.   Endo/Heme/Allergies: Negative.   Psychiatric/Behavioral: The patient is nervous/anxious.       Physical Exam:  weight is 115 lb (52.2 kg). Her oral temperature is 97.8 F (36.6 C). Her blood pressure is 146/66 (abnormal) and her pulse is 79. Her respiration is 16 and oxygen saturation is 97%.   Wt Readings from Last 3 Encounters:  09/26/19 115 lb (52.2 kg)  08/14/19 115 lb 12.8 oz (52.5 kg)  07/04/19 115 lb (52.2 kg)    Physical Exam Vitals reviewed.  HENT:     Head: Normocephalic and atraumatic.  Eyes:     Pupils: Pupils are equal, round, and reactive to light.  Cardiovascular:     Rate and Rhythm: Normal rate and regular rhythm.     Heart sounds: Normal heart sounds.  Pulmonary:     Effort: Pulmonary effort is normal.     Breath sounds: Normal breath sounds.  Abdominal:     General: Bowel sounds are normal.     Palpations: Abdomen is soft.  Musculoskeletal:        General: No tenderness or deformity. Normal  range of motion.     Cervical back: Normal range of motion.  Lymphadenopathy:     Cervical: No cervical adenopathy.  Skin:    General: Skin is warm and dry.     Findings: No erythema or rash.  Neurological:     Mental Status: She is alert and oriented to person, place, and time.  Psychiatric:        Behavior: Behavior normal.        Thought Content: Thought content normal.        Judgment: Judgment normal.      Lab Results  Component Value Date   WBC 7.2 09/26/2019   HGB 13.0 09/26/2019   HCT 38.4 09/26/2019   MCV 103.5 (H) 09/26/2019   PLT 120 (L) 09/26/2019   No results found for: FERRITIN, IRON, TIBC, UIBC, IRONPCTSAT Lab Results  Component Value Date   RETICCTPCT 2.9 (H) 10/30/2014   RBC 3.71 (L) 09/26/2019   RETICCTABS 98.3 10/30/2014    Lab Results  Component Value Date   KPAFRELGTCHN 14.6 08/14/2019   LAMBDASER 2.7 (L) 08/14/2019   KAPLAMBRATIO 5.41 (H) 08/14/2019   Lab Results  Component Value Date   IGGSERUM 604 08/14/2019   IGA 12 (L) 08/14/2019   IGMSERUM 145 08/14/2019   Lab Results  Component Value Date   TOTALPROTELP 6.0 08/14/2019   ALBUMINELP 3.7 08/14/2019   A1GS 0.2 08/14/2019   A2GS 0.6 08/14/2019   BETS 0.9 08/14/2019   BETA2SER 0.2 02/15/2015   GAMS 0.7 08/14/2019   MSPIKE 0.2 (H) 08/14/2019   SPEI Comment 11/12/2018     Chemistry      Component Value Date/Time   NA 142 09/26/2019 0800   NA 147 (H) 03/19/2017 0849   NA 139 03/16/2016 1337   K 4.3 09/26/2019 0800   K 4.5 03/19/2017 0849   K 3.8 03/16/2016 1337   CL 106 09/26/2019 0800   CL 108 03/19/2017 0849   CO2 29 09/26/2019 0800   CO2 31 03/19/2017 0849   CO2 27 03/16/2016 1337   BUN 8 09/26/2019 0800   BUN 11 03/19/2017 0849   BUN 5.2 (L) 03/16/2016 1337   CREATININE 0.75 09/26/2019 0800   CREATININE 1.0 03/19/2017 0849   CREATININE 0.7 03/16/2016 1337      Component Value Date/Time   CALCIUM 8.8 (L) 09/26/2019 0800   CALCIUM 9.6 03/19/2017 0849   CALCIUM 8.8 03/16/2016 1337   ALKPHOS 56 09/26/2019 0800   ALKPHOS 53 03/19/2017 0849   ALKPHOS 62 03/16/2016 1337   AST 19 09/26/2019 0800   AST 24 03/16/2016 1337   ALT 13 09/26/2019 0800   ALT 23 03/19/2017 0849   ALT 19 03/16/2016 1337   BILITOT 0.8 09/26/2019 0800   BILITOT 0.57 03/16/2016 1337       Impression and Plan: Kristy [name] is a very pleasant 64 yo caucasian female with Waldenstrom's with acquired hypogammaglobulinemia and recurrent sinusitis.   We will proceed with IVIG today as planned.   She will get her vitamin B12 today.  She likes having it done in the office.  For right now, I will keep the Imbruvica dose the same.  We will plan to get her back in 6 more weeks.  [name], MD 7/9/20218:35 AM

## 2019-09-27 LAB — IGG, IGA, IGM
IgA: 11 mg/dL — ABNORMAL LOW (ref 87–352)
IgG (Immunoglobin G), Serum: 568 mg/dL — ABNORMAL LOW (ref 586–1602)
IgM (Immunoglobulin M), Srm: 131 mg/dL (ref 26–217)

## 2019-09-29 LAB — KAPPA/LAMBDA LIGHT CHAINS
Kappa free light chain: 13.6 mg/L (ref 3.3–19.4)
Kappa, lambda light chain ratio: 5.23 — ABNORMAL HIGH (ref 0.26–1.65)
Lambda free light chains: 2.6 mg/L — ABNORMAL LOW (ref 5.7–26.3)

## 2019-09-30 LAB — IMMUNOFIXATION REFLEX, SERUM
IgA: 13 mg/dL — ABNORMAL LOW (ref 87–352)
IgG (Immunoglobin G), Serum: 654 mg/dL (ref 586–1602)
IgM (Immunoglobulin M), Srm: 153 mg/dL (ref 26–217)

## 2019-09-30 LAB — PROTEIN ELECTROPHORESIS, SERUM, WITH REFLEX
A/G Ratio: 1.7 (ref 0.7–1.7)
Albumin ELP: 3.8 g/dL (ref 2.9–4.4)
Alpha-1-Globulin: 0.2 g/dL (ref 0.0–0.4)
Alpha-2-Globulin: 0.6 g/dL (ref 0.4–1.0)
Beta Globulin: 0.8 g/dL (ref 0.7–1.3)
Gamma Globulin: 0.6 g/dL (ref 0.4–1.8)
Globulin, Total: 2.2 g/dL (ref 2.2–3.9)
M-Spike, %: 0.1 g/dL — ABNORMAL HIGH
SPEP Interpretation: 0
Total Protein ELP: 6 g/dL (ref 6.0–8.5)

## 2019-10-13 MED FILL — IMBRUVICA 420 MG TAB: 420 | 28 days supply | Qty: 28 | Fill #5

## 2019-11-07 ENCOUNTER — Telehealth: Payer: Self-pay | Admitting: Hematology & Oncology

## 2019-11-07 ENCOUNTER — Other Ambulatory Visit: Payer: Self-pay

## 2019-11-07 ENCOUNTER — Inpatient Hospital Stay: Payer: Federal, State, Local not specified - PPO

## 2019-11-07 ENCOUNTER — Inpatient Hospital Stay (HOSPITAL_BASED_OUTPATIENT_CLINIC_OR_DEPARTMENT_OTHER): Payer: Federal, State, Local not specified - PPO | Admitting: Hematology & Oncology

## 2019-11-07 ENCOUNTER — Inpatient Hospital Stay: Payer: Federal, State, Local not specified - PPO | Attending: Hematology & Oncology

## 2019-11-07 ENCOUNTER — Encounter: Payer: Self-pay | Admitting: Hematology & Oncology

## 2019-11-07 VITALS — BP 137/74 | HR 87 | Temp 97.8°F | Resp 19 | Ht 60.0 in | Wt 116.4 lb

## 2019-11-07 VITALS — BP 123/71 | HR 85 | Temp 98.6°F | Resp 17

## 2019-11-07 DIAGNOSIS — C880 Waldenstrom macroglobulinemia: Secondary | ICD-10-CM

## 2019-11-07 DIAGNOSIS — J329 Chronic sinusitis, unspecified: Secondary | ICD-10-CM | POA: Diagnosis not present

## 2019-11-07 DIAGNOSIS — D801 Nonfamilial hypogammaglobulinemia: Secondary | ICD-10-CM | POA: Insufficient documentation

## 2019-11-07 DIAGNOSIS — J0101 Acute recurrent maxillary sinusitis: Secondary | ICD-10-CM

## 2019-11-07 DIAGNOSIS — D510 Vitamin B12 deficiency anemia due to intrinsic factor deficiency: Secondary | ICD-10-CM | POA: Insufficient documentation

## 2019-11-07 LAB — CBC WITH DIFFERENTIAL (CANCER CENTER ONLY)
Abs Immature Granulocytes: 0.11 10*3/uL — ABNORMAL HIGH (ref 0.00–0.07)
Basophils Absolute: 0 10*3/uL (ref 0.0–0.1)
Basophils Relative: 1 %
Eosinophils Absolute: 0.1 10*3/uL (ref 0.0–0.5)
Eosinophils Relative: 1 %
HCT: 39.8 % (ref 36.0–46.0)
Hemoglobin: 13.7 g/dL (ref 12.0–15.0)
Immature Granulocytes: 1 %
Lymphocytes Relative: 22 %
Lymphs Abs: 1.9 10*3/uL (ref 0.7–4.0)
MCH: 35.4 pg — ABNORMAL HIGH (ref 26.0–34.0)
MCHC: 34.4 g/dL (ref 30.0–36.0)
MCV: 102.8 fL — ABNORMAL HIGH (ref 80.0–100.0)
Monocytes Absolute: 0.7 10*3/uL (ref 0.1–1.0)
Monocytes Relative: 8 %
Neutro Abs: 5.7 10*3/uL (ref 1.7–7.7)
Neutrophils Relative %: 67 %
Platelet Count: 123 10*3/uL — ABNORMAL LOW (ref 150–400)
RBC: 3.87 MIL/uL (ref 3.87–5.11)
RDW: 12.2 % (ref 11.5–15.5)
WBC Count: 8.4 10*3/uL (ref 4.0–10.5)
nRBC: 0 % (ref 0.0–0.2)

## 2019-11-07 LAB — CMP (CANCER CENTER ONLY)
ALT: 29 U/L (ref 0–44)
AST: 31 U/L (ref 15–41)
Albumin: 4.3 g/dL (ref 3.5–5.0)
Alkaline Phosphatase: 60 U/L (ref 38–126)
Anion gap: 8 (ref 5–15)
BUN: 13 mg/dL (ref 8–23)
CO2: 29 mmol/L (ref 22–32)
Calcium: 9.3 mg/dL (ref 8.9–10.3)
Chloride: 103 mmol/L (ref 98–111)
Creatinine: 0.82 mg/dL (ref 0.44–1.00)
GFR, Est AFR Am: >60 mL/min (ref >60)
GFR, Estimated: >60 mL/min (ref >60)
Glucose, Bld: 102 mg/dL — ABNORMAL HIGH (ref 70–99)
Potassium: 4.4 mmol/L (ref 3.5–5.1)
Sodium: 140 mmol/L (ref 135–145)
Total Bilirubin: 1 mg/dL (ref 0.3–1.2)
Total Protein: 6.5 g/dL (ref 6.5–8.1)

## 2019-11-07 MED ORDER — ACETAMINOPHEN 325 MG PO TABS
650.0000 mg | ORAL_TABLET | Freq: Once | ORAL | Status: AC
  Administered 2019-11-07: 650 mg via ORAL

## 2019-11-07 MED ORDER — CYANOCOBALAMIN 1000 MCG/ML IJ SOLN
INTRAMUSCULAR | Status: AC
  Filled 2019-11-07: qty 1

## 2019-11-07 MED ORDER — DEXTROSE 5 % IV SOLN
INTRAVENOUS | Status: DC
  Filled 2019-11-07: qty 250

## 2019-11-07 MED ORDER — DIPHENHYDRAMINE HCL 25 MG PO CAPS
ORAL_CAPSULE | ORAL | Status: AC
  Filled 2019-11-07: qty 2

## 2019-11-07 MED ORDER — DIPHENHYDRAMINE HCL 25 MG PO CAPS
25.0000 mg | ORAL_CAPSULE | Freq: Once | ORAL | Status: AC
  Administered 2019-11-07: 25 mg via ORAL

## 2019-11-07 MED ORDER — CYANOCOBALAMIN 1000 MCG/ML IJ SOLN
1000.0000 ug | Freq: Once | INTRAMUSCULAR | Status: AC
  Administered 2019-11-07: 1000 ug via INTRAMUSCULAR

## 2019-11-07 MED ORDER — IMMUNE GLOBULIN (HUMAN) 20 GM/200ML IV SOLN
40.0000 g | Freq: Once | INTRAVENOUS | Status: AC
  Administered 2019-11-07: 40 g via INTRAVENOUS
  Filled 2019-11-07: qty 400

## 2019-11-07 MED ORDER — ACETAMINOPHEN 325 MG PO TABS
ORAL_TABLET | ORAL | Status: AC
  Filled 2019-11-07: qty 2

## 2019-11-07 NOTE — Progress Notes (Signed)
Hematology and Oncology Follow Up Visit  Kristy [name] [id number] [birth year] 64 y.o. 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  She is doing quite well.  So far, she had a good summer.  She is still working.  She did get the coronavirus vaccine.  She did find out that she was exposed to the coronavirus but had antibodies prior.  She has had no problems with the vaccine.  Her monoclonal studies back in July showed an M spike of 0.1 g/dL.  Her IgM level was 140 mg/dL.  In September, she will be going up to [place] to see her mother.  Her mother is 43 years old.  She has had no problems with her legs.  She did see a vascular specialist for the veins.  She had good circulation.  She has had no problems with bowels or bladder.  She has had no rashes.  There has been no headache.  Overall, her performance status is ECOG 1.     Medications:  Allergies as of 11/07/2019   No Known Allergies     Medication List       Accurate as of November 07, 2019  8:13 AM. If you have any questions, ask your nurse or doctor.        cyanocobalamin 1000 MCG/ML injection Commonly known as: (VITAMIN B-12) INJECT 1 ML INTO THE MUSCLE EVERY 2 MONTHS   Imbruvica 420 MG Tabs Generic drug: Ibrutinib TAKE 1 TABLET BY MOUTH DAILY AFTER BREAKFAST.   Vitamin D3 25 MCG tablet Commonly known as: Vitamin D Take by mouth daily.   Xiidra 5 % Soln Generic drug: Lifitegrast Apply 1 drop to eye 2 (two) times daily.       Allergies: No Known Allergies  Past Medical History, Surgical history, Social history, and Family History were reviewed and updated.  Review of Systems: Review of  Systems  Constitutional: Negative.   HENT: Negative.   Eyes: Negative.   Respiratory: Negative.   Cardiovascular: Negative.   Gastrointestinal: Negative.   Genitourinary: Negative.   Musculoskeletal: Negative.   Skin: Negative.   Neurological: Negative.   Endo/Heme/Allergies: Negative.   Psychiatric/Behavioral: The patient is nervous/anxious.       Physical Exam:  vitals were not taken for this visit.   Wt Readings from Last 3 Encounters:  09/26/19 115 lb (52.2 kg)  08/14/19 115 lb 12.8 oz (52.5 kg)  07/04/19 115 lb (52.2 kg)    Physical Exam Vitals reviewed.  HENT:     Head: Normocephalic and atraumatic.  Eyes:     Pupils: Pupils are equal, round, and reactive to light.  Cardiovascular:     Rate and Rhythm: Normal rate and regular rhythm.     Heart sounds: Normal heart sounds.  Pulmonary:     Effort: Pulmonary effort is normal.     Breath sounds: Normal breath sounds.  Abdominal:     General: Bowel sounds are normal.     Palpations: Abdomen is soft.  Musculoskeletal:        General: No tenderness or deformity. Normal range of motion.     Cervical back: Normal range of motion.  Lymphadenopathy:     Cervical: No cervical  adenopathy.  Skin:    General: Skin is warm and dry.     Findings: No erythema or rash.  Neurological:     Mental Status: She is alert and oriented to person, place, and time.  Psychiatric:        Behavior: Behavior normal.        Thought Content: Thought content normal.        Judgment: Judgment normal.      Lab Results  Component Value Date   WBC 8.4 11/07/2019   HGB 13.7 11/07/2019   HCT 39.8 11/07/2019   MCV 102.8 (H) 11/07/2019   PLT 123 (L) 11/07/2019   No results found for: FERRITIN, IRON, TIBC, UIBC, IRONPCTSAT Lab Results  Component Value Date   RETICCTPCT 2.9 (H) 10/30/2014   RBC 3.87 11/07/2019   RETICCTABS 98.3 10/30/2014   Lab Results  Component Value Date   KPAFRELGTCHN 13.6 09/26/2019   LAMBDASER 2.6 (L)  09/26/2019   KAPLAMBRATIO 5.23 (H) 09/26/2019   Lab Results  Component Value Date   IGGSERUM 568 (L) 09/26/2019   IGGSERUM 654 09/26/2019   IGA 11 (L) 09/26/2019   IGA 13 (L) 09/26/2019   IGMSERUM 131 09/26/2019   IGMSERUM 153 09/26/2019   Lab Results  Component Value Date   TOTALPROTELP 6.0 09/26/2019   ALBUMINELP 3.8 09/26/2019   A1GS 0.2 09/26/2019   A2GS 0.6 09/26/2019   BETS 0.8 09/26/2019   BETA2SER 0.2 02/15/2015   GAMS 0.6 09/26/2019   MSPIKE 0.1 (H) 09/26/2019   SPEI Comment 11/12/2018     Chemistry      Component Value Date/Time   NA 142 09/26/2019 0800   NA 147 (H) 03/19/2017 0849   NA 139 03/16/2016 1337   K 4.3 09/26/2019 0800   K 4.5 03/19/2017 0849   K 3.8 03/16/2016 1337   CL 106 09/26/2019 0800   CL 108 03/19/2017 0849   CO2 29 09/26/2019 0800   CO2 31 03/19/2017 0849   CO2 27 03/16/2016 1337   BUN 8 09/26/2019 0800   BUN 11 03/19/2017 0849   BUN 5.2 (L) 03/16/2016 1337   CREATININE 0.75 09/26/2019 0800   CREATININE 1.0 03/19/2017 0849   CREATININE 0.7 03/16/2016 1337      Component Value Date/Time   CALCIUM 8.8 (L) 09/26/2019 0800   CALCIUM 9.6 03/19/2017 0849   CALCIUM 8.8 03/16/2016 1337   ALKPHOS 56 09/26/2019 0800   ALKPHOS 53 03/19/2017 0849   ALKPHOS 62 03/16/2016 1337   AST 19 09/26/2019 0800   AST 24 03/16/2016 1337   ALT 13 09/26/2019 0800   ALT 23 03/19/2017 0849   ALT 19 03/16/2016 1337   BILITOT 0.8 09/26/2019 0800   BILITOT 0.57 03/16/2016 1337       Impression and Plan: Kristy [name] is a very pleasant 64 yo caucasian female with Waldenstrom's with acquired hypogammaglobulinemia and recurrent sinusitis.   We will proceed with IVIG today as planned.   She will get her vitamin B12 today.  She likes having it done in the office.  For right now, I will keep the Imbruvica dose the same.  We will plan to get her back in 6 more weeks.  [name], MD 8/20/20218:13 AM

## 2019-11-07 NOTE — Patient Instructions (Signed)
Immune Globulin Injection What is this medicine? IMMUNE GLOBULIN (im MUNE GLOB yoo lin) helps to prevent or reduce the severity of certain infections in patients who are at risk. This medicine is collected from the pooled blood of many donors. It is used to treat immune system problems, thrombocytopenia, and Kawasaki syndrome. This medicine may be used for other purposes; ask your health care provider or pharmacist if you have questions. COMMON BRAND NAME(S): ASCENIV, Baygam, BIVIGAM, Carimune, Carimune NF, cutaquig, Cuvitru, Flebogamma, Flebogamma DIF, GamaSTAN, GamaSTAN S/D, Gamimune N, Gammagard, Gammagard S/D, Gammaked, Gammaplex, Gammar-P IV, Gamunex, Gamunex-C, Hizentra, Iveegam, Iveegam EN, Octagam, Panglobulin, Panglobulin NF, panzyga, Polygam S/D, Privigen, Sandoglobulin, Venoglobulin-S, Vigam, Vivaglobulin, Xembify What should I tell my health care provider before I take this medicine? They need to know if you have any of these conditions:  diabetes  extremely low or no immune antibodies in the blood  heart disease  history of blood clots  hyperprolinemia  infection in the blood, sepsis  kidney disease  recently received or scheduled to receive a vaccination  an unusual or allergic reaction to human immune globulin, albumin, maltose, sucrose, other medicines, foods, dyes, or preservatives  pregnant or trying to get pregnant  breast-feeding How should I use this medicine? This medicine is for injection into a muscle or infusion into a vein or skin. It is usually given by a health care professional in a hospital or clinic setting. In rare cases, some brands of this medicine might be given at home. You will be taught how to give this medicine. Use exactly as directed. Take your medicine at regular intervals. Do not take your medicine more often than directed. Talk to your pediatrician regarding the use of this medicine in children. While this drug may be prescribed for selected  conditions, precautions do apply. Overdosage: If you think you have taken too much of this medicine contact a poison control center or emergency room at once. NOTE: This medicine is only for you. Do not share this medicine with others. What if I miss a dose? It is important not to miss your dose. Call your doctor or health care professional if you are unable to keep an appointment. If you give yourself the medicine and you miss a dose, take it as soon as you can. If it is almost time for your next dose, take only that dose. Do not take double or extra doses. What may interact with this medicine?  aspirin and aspirin-like medicines  cisplatin  cyclosporine  medicines for infection like acyclovir, adefovir, amphotericin B, bacitracin, cidofovir, foscarnet, ganciclovir, gentamicin, pentamidine, vancomycin  NSAIDS, medicines for pain and inflammation, like ibuprofen or naproxen  pamidronate  vaccines  zoledronic acid This list may not describe all possible interactions. Give your health care provider a list of all the medicines, herbs, non-prescription drugs, or dietary supplements you use. Also tell them if you smoke, drink alcohol, or use illegal drugs. Some items may interact with your medicine. What should I watch for while using this medicine? Your condition will be monitored carefully while you are receiving this medicine. This medicine is made from pooled blood donations of many different people. It may be possible to pass an infection in this medicine. However, the donors are screened for infections and all products are tested for HIV and hepatitis. The medicine is treated to kill most or all bacteria and viruses. Talk to your doctor about the risks and benefits of this medicine. Do not have vaccinations for at least   14 days before, or until at least 3 months after receiving this medicine. What side effects may I notice from receiving this medicine? Side effects that you should report  to your doctor or health care professional as soon as possible:  allergic reactions like skin rash, itching or hives, swelling of the face, lips, or tongue  blue colored lips or skin  breathing problems  chest pain or tightness  fever  signs and symptoms of aseptic meningitis such as stiff neck; sensitivity to light; headache; drowsiness; fever; nausea; vomiting; rash  signs and symptoms of a blood clot such as chest pain; shortness of breath; pain, swelling, or warmth in the leg  signs and symptoms of hemolytic anemia such as fast heartbeat; tiredness; dark yellow or brown urine; or yellowing of the eyes or skin  signs and symptoms of kidney injury like trouble passing urine or change in the amount of urine  sudden weight gain  swelling of the ankles, feet, hands Side effects that usually do not require medical attention (report to your doctor or health care professional if they continue or are bothersome):  diarrhea  flushing  headache  increased sweating  joint pain  muscle cramps  muscle pain  nausea  pain, redness, or irritation at site where injected  tiredness This list may not describe all possible side effects. Call your doctor for medical advice about side effects. You may report side effects to FDA at 1-800-FDA-1088. Where should I keep my medicine? Keep out of the reach of children. This drug is usually given in a hospital or clinic and will not be stored at home. In rare cases, some brands of this medicine may be given at home. If you are using this medicine at home, you will be instructed on how to store this medicine. Throw away any unused medicine after the expiration date on the label. NOTE: This sheet is a summary. It may not cover all possible information. If you have questions about this medicine, talk to your doctor, pharmacist, or health care provider.  2020 Elsevier/Gold Standard (2018-10-09 12:51:14)  

## 2019-11-07 NOTE — Telephone Encounter (Signed)
Appointments scheduled calendar printed per 8/20 los 

## 2019-11-08 LAB — IGG, IGA, IGM
IgA: 13 mg/dL — ABNORMAL LOW (ref 87–352)
IgG (Immunoglobin G), Serum: 552 mg/dL — ABNORMAL LOW (ref 586–1602)
IgM (Immunoglobulin M), Srm: 135 mg/dL (ref 26–217)

## 2019-11-10 LAB — KAPPA/LAMBDA LIGHT CHAINS
Kappa free light chain: 14.6 mg/L (ref 3.3–19.4)
Kappa, lambda light chain ratio: 1.64 (ref 0.26–1.65)
Lambda free light chains: 8.9 mg/L (ref 5.7–26.3)

## 2019-11-10 MED FILL — IMBRUVICA 420 MG TAB: 420 | 28 days supply | Qty: 28 | Fill #6

## 2019-11-12 LAB — IMMUNOFIXATION REFLEX, SERUM
IgA: 14 mg/dL — ABNORMAL LOW (ref 87–352)
IgG (Immunoglobin G), Serum: 637 mg/dL (ref 586–1602)
IgM (Immunoglobulin M), Srm: 146 mg/dL (ref 26–217)

## 2019-11-12 LAB — PROTEIN ELECTROPHORESIS, SERUM, WITH REFLEX
A/G Ratio: 1.5 (ref 0.7–1.7)
Albumin ELP: 3.7 g/dL (ref 2.9–4.4)
Alpha-1-Globulin: 0.2 g/dL (ref 0.0–0.4)
Alpha-2-Globulin: 0.6 g/dL (ref 0.4–1.0)
Beta Globulin: 0.9 g/dL (ref 0.7–1.3)
Gamma Globulin: 0.6 g/dL (ref 0.4–1.8)
Globulin, Total: 2.4 g/dL (ref 2.2–3.9)
M-Spike, %: 0.3 g/dL — ABNORMAL HIGH
SPEP Interpretation: 0
Total Protein ELP: 6.1 g/dL (ref 6.0–8.5)

## 2019-12-09 MED FILL — IMBRUVICA 420 MG TAB: 420 | 28 days supply | Qty: 28 | Fill #7

## 2019-12-19 ENCOUNTER — Inpatient Hospital Stay (HOSPITAL_BASED_OUTPATIENT_CLINIC_OR_DEPARTMENT_OTHER): Payer: Federal, State, Local not specified - PPO | Admitting: Hematology & Oncology

## 2019-12-19 ENCOUNTER — Encounter: Payer: Self-pay | Admitting: Hematology & Oncology

## 2019-12-19 ENCOUNTER — Inpatient Hospital Stay: Payer: Federal, State, Local not specified - PPO

## 2019-12-19 ENCOUNTER — Other Ambulatory Visit: Payer: Self-pay

## 2019-12-19 ENCOUNTER — Inpatient Hospital Stay: Payer: Federal, State, Local not specified - PPO | Attending: Hematology & Oncology

## 2019-12-19 VITALS — BP 149/67 | HR 81 | Temp 98.4°F | Resp 18 | Wt 115.0 lb

## 2019-12-19 VITALS — BP 126/61 | HR 77 | Temp 98.4°F | Resp 18

## 2019-12-19 DIAGNOSIS — J329 Chronic sinusitis, unspecified: Secondary | ICD-10-CM | POA: Insufficient documentation

## 2019-12-19 DIAGNOSIS — C880 Waldenstrom macroglobulinemia: Secondary | ICD-10-CM

## 2019-12-19 DIAGNOSIS — D510 Vitamin B12 deficiency anemia due to intrinsic factor deficiency: Secondary | ICD-10-CM | POA: Insufficient documentation

## 2019-12-19 DIAGNOSIS — D801 Nonfamilial hypogammaglobulinemia: Secondary | ICD-10-CM | POA: Insufficient documentation

## 2019-12-19 DIAGNOSIS — J0101 Acute recurrent maxillary sinusitis: Secondary | ICD-10-CM

## 2019-12-19 LAB — CBC WITH DIFFERENTIAL (CANCER CENTER ONLY)
Abs Immature Granulocytes: 0.11 10*3/uL — ABNORMAL HIGH (ref 0.00–0.07)
Basophils Absolute: 0 10*3/uL (ref 0.0–0.1)
Basophils Relative: 1 %
Eosinophils Absolute: 0.1 10*3/uL (ref 0.0–0.5)
Eosinophils Relative: 2 %
HCT: 40.3 % (ref 36.0–46.0)
Hemoglobin: 13.3 g/dL (ref 12.0–15.0)
Immature Granulocytes: 2 %
Lymphocytes Relative: 31 %
Lymphs Abs: 1.8 10*3/uL (ref 0.7–4.0)
MCH: 34.7 pg — ABNORMAL HIGH (ref 26.0–34.0)
MCHC: 33 g/dL (ref 30.0–36.0)
MCV: 105.2 fL — ABNORMAL HIGH (ref 80.0–100.0)
Monocytes Absolute: 0.4 10*3/uL (ref 0.1–1.0)
Monocytes Relative: 7 %
Neutro Abs: 3.4 10*3/uL (ref 1.7–7.7)
Neutrophils Relative %: 57 %
Platelet Count: 115 10*3/uL — ABNORMAL LOW (ref 150–400)
RBC: 3.83 MIL/uL — ABNORMAL LOW (ref 3.87–5.11)
RDW: 12.4 % (ref 11.5–15.5)
WBC Count: 5.7 10*3/uL (ref 4.0–10.5)
nRBC: 0 % (ref 0.0–0.2)

## 2019-12-19 LAB — CMP (CANCER CENTER ONLY)
ALT: 15 U/L (ref 0–44)
AST: 21 U/L (ref 15–41)
Albumin: 4.5 g/dL (ref 3.5–5.0)
Alkaline Phosphatase: 63 U/L (ref 38–126)
Anion gap: 7 (ref 5–15)
BUN: 8 mg/dL (ref 8–23)
CO2: 30 mmol/L (ref 22–32)
Calcium: 9.3 mg/dL (ref 8.9–10.3)
Chloride: 104 mmol/L (ref 98–111)
Creatinine: 0.81 mg/dL (ref 0.44–1.00)
GFR, Est AFR Am: >60 mL/min (ref >60)
GFR, Estimated: >60 mL/min (ref >60)
Glucose, Bld: 85 mg/dL (ref 70–99)
Potassium: 3.6 mmol/L (ref 3.5–5.1)
Sodium: 141 mmol/L (ref 135–145)
Total Bilirubin: 1.1 mg/dL (ref 0.3–1.2)
Total Protein: 6.5 g/dL (ref 6.5–8.1)

## 2019-12-19 LAB — LACTATE DEHYDROGENASE: LDH: 228 U/L — ABNORMAL HIGH (ref 98–192)

## 2019-12-19 MED ORDER — DEXTROSE 5 % IV SOLN
INTRAVENOUS | Status: DC
  Filled 2019-12-19: qty 250

## 2019-12-19 MED ORDER — CYANOCOBALAMIN 1000 MCG/ML IJ SOLN
INTRAMUSCULAR | Status: AC
  Filled 2019-12-19: qty 1

## 2019-12-19 MED ORDER — IMMUNE GLOBULIN (HUMAN) 20 GM/200ML IV SOLN
40.0000 g | Freq: Once | INTRAVENOUS | Status: AC
  Administered 2019-12-19: 40 g via INTRAVENOUS
  Filled 2019-12-19: qty 400

## 2019-12-19 MED ORDER — DIPHENHYDRAMINE HCL 25 MG PO CAPS
25.0000 mg | ORAL_CAPSULE | Freq: Once | ORAL | Status: AC
  Administered 2019-12-19: 25 mg via ORAL

## 2019-12-19 MED ORDER — ACETAMINOPHEN 325 MG PO TABS
ORAL_TABLET | ORAL | Status: AC
  Filled 2019-12-19: qty 2

## 2019-12-19 MED ORDER — CYANOCOBALAMIN 1000 MCG/ML IJ SOLN
1000.0000 ug | Freq: Once | INTRAMUSCULAR | Status: AC
  Administered 2019-12-19: 1000 ug via INTRAMUSCULAR

## 2019-12-19 MED ORDER — DIPHENHYDRAMINE HCL 25 MG PO CAPS
ORAL_CAPSULE | ORAL | Status: AC
  Filled 2019-12-19: qty 1

## 2019-12-19 MED ORDER — ACETAMINOPHEN 325 MG PO TABS
650.0000 mg | ORAL_TABLET | Freq: Once | ORAL | Status: AC
  Administered 2019-12-19: 650 mg via ORAL

## 2019-12-19 NOTE — Progress Notes (Signed)
Patient does not want to stay for the 30 minute recommended post IVIG observation. Patient discharged ambulatory without complaints or concerns.

## 2019-12-19 NOTE — Progress Notes (Signed)
Hematology and Oncology Follow Up Visit  Kristy [name] [id number] [birth year] 64 y.o. 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  She is doing quite well.  She really has had no problems today.  She is doing well with the Imbruvica.  She has had no issues with palpitations.  There is been no cough or shortness of breath.  She has had no change in bowel or bladder habits.  Only last saw Kristy, the M spike was up a little bit.  The M spike was 0.3 g/dL.  Kristy IgM level was 135 mg/dL.  She was supposed to go up to [place] to see Kristy [name].  However, there has been some COVID issues.  She had Kristy flight canceled.  She has had no rashes.  There has been no fever.  She has had no nausea or vomiting.  She is still enjoying Kristy granddaughter.  Overall, Kristy performance status is ECOG 0.   Medications:  Allergies as of 12/19/2019   No Known Allergies     Medication List       Accurate as of December 19, 2019 10:05 AM. If you have any questions, ask your nurse or doctor.        cyanocobalamin 1000 MCG/ML injection Commonly known as: (VITAMIN B-12) INJECT 1 ML INTO THE MUSCLE EVERY 2 MONTHS   Imbruvica 420 MG Tabs Generic drug: Ibrutinib TAKE 1 TABLET BY MOUTH DAILY AFTER BREAKFAST.   Vitamin D3 25 MCG tablet Commonly known as: Vitamin D Take by mouth daily.   Xiidra 5 % Soln Generic drug: Lifitegrast Apply 1 drop to eye 2 (two) times daily.       Allergies: No Known Allergies  Past Medical History, Surgical history, Social history, and Family History were reviewed and updated.  Review of Systems: Review of Systems  Constitutional: Negative.   HENT: Negative.    Eyes: Negative.   Respiratory: Negative.   Cardiovascular: Negative.   Gastrointestinal: Negative.   Genitourinary: Negative.   Musculoskeletal: Negative.   Skin: Negative.   Neurological: Negative.   Endo/Heme/Allergies: Negative.   Psychiatric/Behavioral: The patient is nervous/anxious.       Physical Exam:  weight is 115 lb (52.2 kg). Kristy oral temperature is 98.4 F (36.9 C). Kristy blood pressure is 149/67 (abnormal) and Kristy pulse is 81. Kristy respiration is 18 and oxygen saturation is 100%.   Wt Readings from Last 3 Encounters:  12/19/19 115 lb (52.2 kg)  11/07/19 116 lb 6.4 oz (52.8 kg)  09/26/19 115 lb (52.2 kg)    Physical Exam Vitals reviewed.  HENT:     Head: Normocephalic and atraumatic.  Eyes:     Pupils: Pupils are equal, round, and reactive to light.  Cardiovascular:     Rate and Rhythm: Normal rate and regular rhythm.     Heart sounds: Normal heart sounds.  Pulmonary:     Effort: Pulmonary effort is normal.     Breath sounds: Normal breath sounds.  Abdominal:     General: Bowel sounds are normal.     Palpations: Abdomen is soft.  Musculoskeletal:        General: No tenderness or deformity. Normal range of motion.  Cervical back: Normal range of motion.  Lymphadenopathy:     Cervical: No cervical adenopathy.  Skin:    General: Skin is warm and dry.     Findings: No erythema or rash.  Neurological:     Mental Status: She is alert and oriented to person, place, and time.  Psychiatric:        Behavior: Behavior normal.        Thought Content: Thought content normal.        Judgment: Judgment normal.      Lab Results  Component Value Date   WBC 5.7 12/19/2019   HGB 13.3 12/19/2019   HCT 40.3 12/19/2019   MCV 105.2 (H) 12/19/2019   PLT 115 (L) 12/19/2019   No results found for: FERRITIN, IRON, TIBC, UIBC, IRONPCTSAT Lab Results  Component Value Date   RETICCTPCT 2.9 (H) 10/30/2014   RBC 3.83 (L) 12/19/2019   RETICCTABS 98.3 10/30/2014    Lab Results  Component Value Date   KPAFRELGTCHN 14.6 11/07/2019   LAMBDASER 8.9 11/07/2019   KAPLAMBRATIO 1.64 11/07/2019   Lab Results  Component Value Date   IGGSERUM 637 11/07/2019   IGA 14 (L) 11/07/2019   IGMSERUM 146 11/07/2019   Lab Results  Component Value Date   TOTALPROTELP 6.1 11/07/2019   ALBUMINELP 3.7 11/07/2019   A1GS 0.2 11/07/2019   A2GS 0.6 11/07/2019   BETS 0.9 11/07/2019   BETA2SER 0.2 02/15/2015   GAMS 0.6 11/07/2019   MSPIKE 0.3 (H) 11/07/2019   SPEI Comment 11/12/2018     Chemistry      Component Value Date/Time   NA 141 12/19/2019 0913   NA 147 (H) 03/19/2017 0849   NA 139 03/16/2016 1337   K 3.6 12/19/2019 0913   K 4.5 03/19/2017 0849   K 3.8 03/16/2016 1337   CL 104 12/19/2019 0913   CL 108 03/19/2017 0849   CO2 30 12/19/2019 0913   CO2 31 03/19/2017 0849   CO2 27 03/16/2016 1337   BUN 8 12/19/2019 0913   BUN 11 03/19/2017 0849   BUN 5.2 (L) 03/16/2016 1337   CREATININE 0.81 12/19/2019 0913   CREATININE 1.0 03/19/2017 0849   CREATININE 0.7 03/16/2016 1337      Component Value Date/Time   CALCIUM 9.3 12/19/2019 0913   CALCIUM 9.6 03/19/2017 0849   CALCIUM 8.8 03/16/2016 1337   ALKPHOS 63 12/19/2019 0913   ALKPHOS 53 03/19/2017 0849   ALKPHOS 62 03/16/2016 1337   AST 21 12/19/2019 0913   AST 24 03/16/2016 1337   ALT 15 12/19/2019 0913   ALT 23 03/19/2017 0849   ALT 19 03/16/2016 1337   BILITOT 1.1 12/19/2019 0913   BILITOT 0.57 03/16/2016 1337       Impression and Plan: Kristy [name] is a very pleasant 64 yo caucasian female with Waldenstrom's with acquired hypogammaglobulinemia and recurrent sinusitis.   We will proceed with IVIG today as planned.   She will get Kristy vitamin B12 today.  She likes having it done in the office.  For right now, I will keep the Imbruvica dose the same.  We will plan to get Kristy back in 6 more weeks.  [name], MD 10/1/202110:05 AM

## 2019-12-19 NOTE — Patient Instructions (Signed)
Immune Globulin Injection What is this medicine? IMMUNE GLOBULIN (im MUNE GLOB yoo lin) helps to prevent or reduce the severity of certain infections in patients who are at risk. This medicine is collected from the pooled blood of many donors. It is used to treat immune system problems, thrombocytopenia, and Kawasaki syndrome. This medicine may be used for other purposes; ask your health care provider or pharmacist if you have questions. COMMON BRAND NAME(S): ASCENIV, Baygam, BIVIGAM, Carimune, Carimune NF, cutaquig, Cuvitru, Flebogamma, Flebogamma DIF, GamaSTAN, GamaSTAN S/D, Gamimune N, Gammagard, Gammagard S/D, Gammaked, Gammaplex, Gammar-P IV, Gamunex, Gamunex-C, Hizentra, Iveegam, Iveegam EN, Octagam, Panglobulin, Panglobulin NF, panzyga, Polygam S/D, Privigen, Sandoglobulin, Venoglobulin-S, Vigam, Vivaglobulin, Xembify What should I tell my health care provider before I take this medicine? They need to know if you have any of these conditions:  diabetes  extremely low or no immune antibodies in the blood  heart disease  history of blood clots  hyperprolinemia  infection in the blood, sepsis  kidney disease  recently received or scheduled to receive a vaccination  an unusual or allergic reaction to human immune globulin, albumin, maltose, sucrose, other medicines, foods, dyes, or preservatives  pregnant or trying to get pregnant  breast-feeding How should I use this medicine? This medicine is for injection into a muscle or infusion into a vein or skin. It is usually given by a health care professional in a hospital or clinic setting. In rare cases, some brands of this medicine might be given at home. You will be taught how to give this medicine. Use exactly as directed. Take your medicine at regular intervals. Do not take your medicine more often than directed. Talk to your pediatrician regarding the use of this medicine in children. While this drug may be prescribed for selected  conditions, precautions do apply. Overdosage: If you think you have taken too much of this medicine contact a poison control center or emergency room at once. NOTE: This medicine is only for you. Do not share this medicine with others. What if I miss a dose? It is important not to miss your dose. Call your doctor or health care professional if you are unable to keep an appointment. If you give yourself the medicine and you miss a dose, take it as soon as you can. If it is almost time for your next dose, take only that dose. Do not take double or extra doses. What may interact with this medicine?  aspirin and aspirin-like medicines  cisplatin  cyclosporine  medicines for infection like acyclovir, adefovir, amphotericin B, bacitracin, cidofovir, foscarnet, ganciclovir, gentamicin, pentamidine, vancomycin  NSAIDS, medicines for pain and inflammation, like ibuprofen or naproxen  pamidronate  vaccines  zoledronic acid This list may not describe all possible interactions. Give your health care provider a list of all the medicines, herbs, non-prescription drugs, or dietary supplements you use. Also tell them if you smoke, drink alcohol, or use illegal drugs. Some items may interact with your medicine. What should I watch for while using this medicine? Your condition will be monitored carefully while you are receiving this medicine. This medicine is made from pooled blood donations of many different people. It may be possible to pass an infection in this medicine. However, the donors are screened for infections and all products are tested for HIV and hepatitis. The medicine is treated to kill most or all bacteria and viruses. Talk to your doctor about the risks and benefits of this medicine. Do not have vaccinations for at least   14 days before, or until at least 3 months after receiving this medicine. What side effects may I notice from receiving this medicine? Side effects that you should report  to your doctor or health care professional as soon as possible:  allergic reactions like skin rash, itching or hives, swelling of the face, lips, or tongue  blue colored lips or skin  breathing problems  chest pain or tightness  fever  signs and symptoms of aseptic meningitis such as stiff neck; sensitivity to light; headache; drowsiness; fever; nausea; vomiting; rash  signs and symptoms of a blood clot such as chest pain; shortness of breath; pain, swelling, or warmth in the leg  signs and symptoms of hemolytic anemia such as fast heartbeat; tiredness; dark yellow or brown urine; or yellowing of the eyes or skin  signs and symptoms of kidney injury like trouble passing urine or change in the amount of urine  sudden weight gain  swelling of the ankles, feet, hands Side effects that usually do not require medical attention (report to your doctor or health care professional if they continue or are bothersome):  diarrhea  flushing  headache  increased sweating  joint pain  muscle cramps  muscle pain  nausea  pain, redness, or irritation at site where injected  tiredness This list may not describe all possible side effects. Call your doctor for medical advice about side effects. You may report side effects to FDA at 1-800-FDA-1088. Where should I keep my medicine? Keep out of the reach of children. This drug is usually given in a hospital or clinic and will not be stored at home. In rare cases, some brands of this medicine may be given at home. If you are using this medicine at home, you will be instructed on how to store this medicine. Throw away any unused medicine after the expiration date on the label. NOTE: This sheet is a summary. It may not cover all possible information. If you have questions about this medicine, talk to your doctor, pharmacist, or health care provider.  2020 Elsevier/Gold Standard (2018-10-09 12:51:14)  

## 2019-12-20 LAB — IGG, IGA, IGM
IgA: 13 mg/dL — ABNORMAL LOW (ref 87–352)
IgG (Immunoglobin G), Serum: 586 mg/dL (ref 586–1602)
IgM (Immunoglobulin M), Srm: 127 mg/dL (ref 26–217)

## 2019-12-22 LAB — KAPPA/LAMBDA LIGHT CHAINS
Kappa free light chain: 13.2 mg/L (ref 3.3–19.4)
Kappa, lambda light chain ratio: 6.29 — ABNORMAL HIGH (ref 0.26–1.65)
Lambda free light chains: 2.1 mg/L — ABNORMAL LOW (ref 5.7–26.3)

## 2019-12-23 LAB — PROTEIN ELECTROPHORESIS, SERUM, WITH REFLEX
A/G Ratio: 1.4 (ref 0.7–1.7)
Albumin ELP: 3.7 g/dL (ref 2.9–4.4)
Alpha-1-Globulin: 0.3 g/dL (ref 0.0–0.4)
Alpha-2-Globulin: 0.6 g/dL (ref 0.4–1.0)
Beta Globulin: 1 g/dL (ref 0.7–1.3)
Gamma Globulin: 0.7 g/dL (ref 0.4–1.8)
Globulin, Total: 2.6 g/dL (ref 2.2–3.9)
M-Spike, %: 0.2 g/dL — ABNORMAL HIGH
SPEP Interpretation: 0
Total Protein ELP: 6.3 g/dL (ref 6.0–8.5)

## 2019-12-23 LAB — IMMUNOFIXATION REFLEX, SERUM
IgA: 15 mg/dL — ABNORMAL LOW (ref 87–352)
IgG (Immunoglobin G), Serum: 660 mg/dL (ref 586–1602)
IgM (Immunoglobulin M), Srm: 147 mg/dL (ref 26–217)

## 2020-01-07 MED FILL — IMBRUVICA 420 MG TAB: 420 | 28 days supply | Qty: 28 | Fill #8

## 2020-01-30 ENCOUNTER — Ambulatory Visit: Payer: Federal, State, Local not specified - PPO | Admitting: Hematology & Oncology

## 2020-01-30 ENCOUNTER — Other Ambulatory Visit: Payer: Federal, State, Local not specified - PPO

## 2020-01-30 ENCOUNTER — Ambulatory Visit: Payer: Federal, State, Local not specified - PPO

## 2020-02-05 ENCOUNTER — Inpatient Hospital Stay (HOSPITAL_BASED_OUTPATIENT_CLINIC_OR_DEPARTMENT_OTHER): Payer: Federal, State, Local not specified - PPO | Admitting: Family

## 2020-02-05 ENCOUNTER — Other Ambulatory Visit: Payer: Self-pay

## 2020-02-05 ENCOUNTER — Telehealth: Payer: Self-pay

## 2020-02-05 ENCOUNTER — Encounter: Payer: Self-pay | Admitting: Family

## 2020-02-05 ENCOUNTER — Inpatient Hospital Stay: Payer: Federal, State, Local not specified - PPO | Attending: Hematology & Oncology

## 2020-02-05 ENCOUNTER — Inpatient Hospital Stay: Payer: Federal, State, Local not specified - PPO

## 2020-02-05 VITALS — BP 131/68 | HR 80 | Temp 98.1°F | Resp 18

## 2020-02-05 VITALS — BP 147/78 | HR 81 | Temp 98.0°F | Wt 113.4 lb

## 2020-02-05 DIAGNOSIS — D510 Vitamin B12 deficiency anemia due to intrinsic factor deficiency: Secondary | ICD-10-CM | POA: Insufficient documentation

## 2020-02-05 DIAGNOSIS — D801 Nonfamilial hypogammaglobulinemia: Secondary | ICD-10-CM | POA: Insufficient documentation

## 2020-02-05 DIAGNOSIS — C880 Waldenstrom macroglobulinemia: Secondary | ICD-10-CM | POA: Insufficient documentation

## 2020-02-05 DIAGNOSIS — J329 Chronic sinusitis, unspecified: Secondary | ICD-10-CM | POA: Diagnosis not present

## 2020-02-05 DIAGNOSIS — J0101 Acute recurrent maxillary sinusitis: Secondary | ICD-10-CM

## 2020-02-05 LAB — CBC WITH DIFFERENTIAL (CANCER CENTER ONLY)
Abs Immature Granulocytes: 0.1 10*3/uL — ABNORMAL HIGH (ref 0.00–0.07)
Basophils Absolute: 0 10*3/uL (ref 0.0–0.1)
Basophils Relative: 1 %
Eosinophils Absolute: 0.1 10*3/uL (ref 0.0–0.5)
Eosinophils Relative: 1 %
HCT: 39.5 % (ref 36.0–46.0)
Hemoglobin: 13.2 g/dL (ref 12.0–15.0)
Immature Granulocytes: 1 %
Lymphocytes Relative: 26 %
Lymphs Abs: 2.2 10*3/uL (ref 0.7–4.0)
MCH: 34.9 pg — ABNORMAL HIGH (ref 26.0–34.0)
MCHC: 33.4 g/dL (ref 30.0–36.0)
MCV: 104.5 fL — ABNORMAL HIGH (ref 80.0–100.0)
Monocytes Absolute: 0.7 10*3/uL (ref 0.1–1.0)
Monocytes Relative: 8 %
Neutro Abs: 5.3 10*3/uL (ref 1.7–7.7)
Neutrophils Relative %: 63 %
Platelet Count: 115 10*3/uL — ABNORMAL LOW (ref 150–400)
RBC: 3.78 MIL/uL — ABNORMAL LOW (ref 3.87–5.11)
RDW: 12.2 % (ref 11.5–15.5)
WBC Count: 8.5 10*3/uL (ref 4.0–10.5)
nRBC: 0 % (ref 0.0–0.2)

## 2020-02-05 LAB — CMP (CANCER CENTER ONLY)
ALT: 13 U/L (ref 0–44)
AST: 19 U/L (ref 15–41)
Albumin: 4.3 g/dL (ref 3.5–5.0)
Alkaline Phosphatase: 54 U/L (ref 38–126)
Anion gap: 8 (ref 5–15)
BUN: 10 mg/dL (ref 8–23)
CO2: 27 mmol/L (ref 22–32)
Calcium: 9 mg/dL (ref 8.9–10.3)
Chloride: 103 mmol/L (ref 98–111)
Creatinine: 0.79 mg/dL (ref 0.44–1.00)
GFR, Estimated: >60 mL/min (ref >60)
Glucose, Bld: 83 mg/dL (ref 70–99)
Potassium: 4 mmol/L (ref 3.5–5.1)
Sodium: 138 mmol/L (ref 135–145)
Total Bilirubin: 1.3 mg/dL — ABNORMAL HIGH (ref 0.3–1.2)
Total Protein: 6.3 g/dL — ABNORMAL LOW (ref 6.5–8.1)

## 2020-02-05 LAB — LACTATE DEHYDROGENASE: LDH: 185 U/L (ref 98–192)

## 2020-02-05 MED ORDER — CYANOCOBALAMIN 1000 MCG/ML IJ SOLN
1000.0000 ug | Freq: Once | INTRAMUSCULAR | Status: AC
  Administered 2020-02-05: 1000 ug via INTRAMUSCULAR

## 2020-02-05 MED ORDER — ACETAMINOPHEN 325 MG PO TABS
650.0000 mg | ORAL_TABLET | Freq: Once | ORAL | Status: AC
  Administered 2020-02-05: 650 mg via ORAL

## 2020-02-05 MED ORDER — DIPHENHYDRAMINE HCL 25 MG PO CAPS
25.0000 mg | ORAL_CAPSULE | Freq: Once | ORAL | Status: AC
  Administered 2020-02-05: 25 mg via ORAL

## 2020-02-05 MED ORDER — DEXTROSE 5 % IV SOLN
INTRAVENOUS | Status: DC
  Filled 2020-02-05: qty 250

## 2020-02-05 MED ORDER — DIPHENHYDRAMINE HCL 25 MG PO CAPS
ORAL_CAPSULE | ORAL | Status: AC
  Filled 2020-02-05: qty 1

## 2020-02-05 MED ORDER — ACETAMINOPHEN 325 MG PO TABS
ORAL_TABLET | ORAL | Status: AC
  Filled 2020-02-05: qty 2

## 2020-02-05 MED ORDER — IMMUNE GLOBULIN (HUMAN) 20 GM/200ML IV SOLN
40.0000 g | Freq: Once | INTRAVENOUS | Status: AC
  Administered 2020-02-05: 40 g via INTRAVENOUS
  Filled 2020-02-05: qty 400

## 2020-02-05 NOTE — Progress Notes (Signed)
Pt discharged in no apparent distress. Pt left ambulatory without assistance. Pt aware of discharge instructions and verbalized understanding and had no further questions.  

## 2020-02-05 NOTE — Progress Notes (Signed)
Hematology and Oncology Follow Up Visit  Kristy [name] [id number] 18-Dec-1955 64 y.o. 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 She is doing quite well and has no complaints at this time.  M-spike in October was stable at 0.2 g/dL, IgG level was 586 mg/dL and IgM level 127 mg/dL.  She has chronic sinus congestion and drainage with dry eyes and nose.  She has SOB at times due to the congestion.  No fever, chills, n/v, cough, rash, dizziness, chest pain, palpitations, abdominal pain or changes in bowel or bladder habits.  No swelling, numbness or tingling in her extremities.  She has occasional leg cramps with Imbruvica but has otherwise tolerated very well.  No falls or syncope.  No episodes of blood loss to report. No abnormal bruising, no petechiae.  She has maintained a good appetite (Vegan) and is staying well hydrated. Her weight is stable at 113 lbs.   ECOG Performance Status: 1 - Symptomatic but completely ambulatory  Medications:  Allergies as of 02/05/2020   No Known Allergies     Medication List       Accurate as of February 05, 2020  8:48 AM. If you have any questions, ask your nurse or doctor.        cyanocobalamin 1000 MCG/ML injection Commonly known as: (VITAMIN B-12) INJECT 1 ML INTO THE MUSCLE EVERY 2 MONTHS   Imbruvica 420 MG Tabs Generic drug: Ibrutinib TAKE 1 TABLET BY MOUTH DAILY AFTER BREAKFAST.   Vitamin D3 25 MCG tablet Commonly known as: Vitamin D Take by mouth daily.   Xiidra 5 % Soln Generic drug: Lifitegrast Apply 1 drop to eye 2 (two) times daily.       Allergies: No Known Allergies  Past Medical History, Surgical history, Social history,  and Family History were reviewed and updated.  Review of Systems: All other 10 point review of systems is negative.   Physical Exam:  weight is 113 lb 6.4 oz (51.4 kg). Her oral temperature is 98 F (36.7 C). Her blood pressure is 147/78 (abnormal) and her pulse is 81. Her oxygen saturation is 98%.   Wt Readings from Last 3 Encounters:  02/05/20 113 lb 6.4 oz (51.4 kg)  12/19/19 115 lb (52.2 kg)  11/07/19 116 lb 6.4 oz (52.8 kg)    Ocular: Sclerae unicteric, pupils equal, round and reactive to light Ear-nose-throat: Oropharynx clear, dentition fair Lymphatic: No cervical or supraclavicular adenopathy Lungs no rales or rhonchi, good excursion bilaterally Heart regular rate and rhythm, no murmur appreciated Abd soft, nontender, positive bowel sounds MSK no focal spinal tenderness, no joint edema Neuro: non-focal, well-oriented, appropriate affect Breasts: Deferred   Lab Results  Component Value Date   WBC 8.5 02/05/2020   HGB 13.2 02/05/2020   HCT 39.5 02/05/2020   MCV 104.5 (H) 02/05/2020   PLT 115 (L) 02/05/2020   No results found for: FERRITIN, IRON, TIBC, UIBC, IRONPCTSAT Lab Results  Component Value Date   RETICCTPCT 2.9 (H) 10/30/2014   RBC 3.78 (L) 02/05/2020   RETICCTABS 98.3 10/30/2014   Lab Results  Component Value Date   KPAFRELGTCHN 13.2 12/19/2019   LAMBDASER 2.1 (L) 12/19/2019   KAPLAMBRATIO 6.29 (H) 12/19/2019  Lab Results  Component Value Date   IGGSERUM 586 12/19/2019   IGGSERUM 660 12/19/2019   IGA 13 (L) 12/19/2019   IGA 15 (L) 12/19/2019   IGMSERUM 127 12/19/2019   IGMSERUM 147 12/19/2019   Lab Results  Component Value Date   TOTALPROTELP 6.3 12/19/2019   ALBUMINELP 3.7 12/19/2019   A1GS 0.3 12/19/2019   A2GS 0.6 12/19/2019   BETS 1.0 12/19/2019   BETA2SER 0.2 02/15/2015   GAMS 0.7 12/19/2019   MSPIKE 0.2 (H) 12/19/2019   SPEI Comment 11/12/2018     Chemistry      Component Value Date/Time   NA 141 12/19/2019 0913   NA 147 (H)  03/19/2017 0849   NA 139 03/16/2016 1337   K 3.6 12/19/2019 0913   K 4.5 03/19/2017 0849   K 3.8 03/16/2016 1337   CL 104 12/19/2019 0913   CL 108 03/19/2017 0849   CO2 30 12/19/2019 0913   CO2 31 03/19/2017 0849   CO2 27 03/16/2016 1337   BUN 8 12/19/2019 0913   BUN 11 03/19/2017 0849   BUN 5.2 (L) 03/16/2016 1337   CREATININE 0.81 12/19/2019 0913   CREATININE 1.0 03/19/2017 0849   CREATININE 0.7 03/16/2016 1337      Component Value Date/Time   CALCIUM 9.3 12/19/2019 0913   CALCIUM 9.6 03/19/2017 0849   CALCIUM 8.8 03/16/2016 1337   ALKPHOS 63 12/19/2019 0913   ALKPHOS 53 03/19/2017 0849   ALKPHOS 62 03/16/2016 1337   AST 21 12/19/2019 0913   AST 24 03/16/2016 1337   ALT 15 12/19/2019 0913   ALT 23 03/19/2017 0849   ALT 19 03/16/2016 1337   BILITOT 1.1 12/19/2019 0913   BILITOT 0.57 03/16/2016 1337       Impression and Plan: Kristy [name] is a very pleasant 64 yo caucasian female with Waldenstrom's with acquired hypogammaglobulinemia and recurrent sinusitis. She continues to tolerate IVIG very well. We will proceed with infusion today as planned.  She also received B 12 injection.  Follow-up in 6 weeks.  She was encouraged to contact our office with any questions or concerns.   Laverna Peace, NP 11/18/20218:48 AM

## 2020-02-05 NOTE — Patient Instructions (Signed)

## 2020-02-05 NOTE — Telephone Encounter (Signed)
appts made per 02/05/20 LOS and pt will receive in tx room-AVS,,, AOM

## 2020-02-05 NOTE — Telephone Encounter (Signed)
Pt called to r/s her 12/30 appt as she was to see Dr PE and not Judson Roch, Done, AOM

## 2020-02-05 NOTE — Patient Instructions (Signed)
Immune Globulin Injection What is this medicine? IMMUNE GLOBULIN (im MUNE GLOB yoo lin) helps to prevent or reduce the severity of certain infections in patients who are at risk. This medicine is collected from the pooled blood of many donors. It is used to treat immune system problems, thrombocytopenia, and Kawasaki syndrome. This medicine may be used for other purposes; ask your health care provider or pharmacist if you have questions. COMMON BRAND NAME(S): ASCENIV, Baygam, BIVIGAM, Carimune, Carimune NF, cutaquig, Cuvitru, Flebogamma, Flebogamma DIF, GamaSTAN, GamaSTAN S/D, Gamimune N, Gammagard, Gammagard S/D, Gammaked, Gammaplex, Gammar-P IV, Gamunex, Gamunex-C, Hizentra, Iveegam, Iveegam EN, Octagam, Panglobulin, Panglobulin NF, panzyga, Polygam S/D, Privigen, Sandoglobulin, Venoglobulin-S, Vigam, Vivaglobulin, Xembify What should I tell my health care provider before I take this medicine? They need to know if you have any of these conditions:  diabetes  extremely low or no immune antibodies in the blood  heart disease  history of blood clots  hyperprolinemia  infection in the blood, sepsis  kidney disease  recently received or scheduled to receive a vaccination  an unusual or allergic reaction to human immune globulin, albumin, maltose, sucrose, other medicines, foods, dyes, or preservatives  pregnant or trying to get pregnant  breast-feeding How should I use this medicine? This medicine is for injection into a muscle or infusion into a vein or skin. It is usually given by a health care professional in a hospital or clinic setting. In rare cases, some brands of this medicine might be given at home. You will be taught how to give this medicine. Use exactly as directed. Take your medicine at regular intervals. Do not take your medicine more often than directed. Talk to your pediatrician regarding the use of this medicine in children. While this drug may be prescribed for selected  conditions, precautions do apply. Overdosage: If you think you have taken too much of this medicine contact a poison control center or emergency room at once. NOTE: This medicine is only for you. Do not share this medicine with others. What if I miss a dose? It is important not to miss your dose. Call your doctor or health care professional if you are unable to keep an appointment. If you give yourself the medicine and you miss a dose, take it as soon as you can. If it is almost time for your next dose, take only that dose. Do not take double or extra doses. What may interact with this medicine?  aspirin and aspirin-like medicines  cisplatin  cyclosporine  medicines for infection like acyclovir, adefovir, amphotericin B, bacitracin, cidofovir, foscarnet, ganciclovir, gentamicin, pentamidine, vancomycin  NSAIDS, medicines for pain and inflammation, like ibuprofen or naproxen  pamidronate  vaccines  zoledronic acid This list may not describe all possible interactions. Give your health care provider a list of all the medicines, herbs, non-prescription drugs, or dietary supplements you use. Also tell them if you smoke, drink alcohol, or use illegal drugs. Some items may interact with your medicine. What should I watch for while using this medicine? Your condition will be monitored carefully while you are receiving this medicine. This medicine is made from pooled blood donations of many different people. It may be possible to pass an infection in this medicine. However, the donors are screened for infections and all products are tested for HIV and hepatitis. The medicine is treated to kill most or all bacteria and viruses. Talk to your doctor about the risks and benefits of this medicine. Do not have vaccinations for at least   14 days before, or until at least 3 months after receiving this medicine. What side effects may I notice from receiving this medicine? Side effects that you should report  to your doctor or health care professional as soon as possible:  allergic reactions like skin rash, itching or hives, swelling of the face, lips, or tongue  blue colored lips or skin  breathing problems  chest pain or tightness  fever  signs and symptoms of aseptic meningitis such as stiff neck; sensitivity to light; headache; drowsiness; fever; nausea; vomiting; rash  signs and symptoms of a blood clot such as chest pain; shortness of breath; pain, swelling, or warmth in the leg  signs and symptoms of hemolytic anemia such as fast heartbeat; tiredness; dark yellow or brown urine; or yellowing of the eyes or skin  signs and symptoms of kidney injury like trouble passing urine or change in the amount of urine  sudden weight gain  swelling of the ankles, feet, hands Side effects that usually do not require medical attention (report to your doctor or health care professional if they continue or are bothersome):  diarrhea  flushing  headache  increased sweating  joint pain  muscle cramps  muscle pain  nausea  pain, redness, or irritation at site where injected  tiredness This list may not describe all possible side effects. Call your doctor for medical advice about side effects. You may report side effects to FDA at 1-800-FDA-1088. Where should I keep my medicine? Keep out of the reach of children. This drug is usually given in a hospital or clinic and will not be stored at home. In rare cases, some brands of this medicine may be given at home. If you are using this medicine at home, you will be instructed on how to store this medicine. Throw away any unused medicine after the expiration date on the label. NOTE: This sheet is a summary. It may not cover all possible information. If you have questions about this medicine, talk to your doctor, pharmacist, or health care provider.  2020 Elsevier/Gold Standard (2018-10-09 12:51:14)  

## 2020-02-06 LAB — PROTEIN ELECTROPHORESIS, SERUM, WITH REFLEX
A/G Ratio: 1.4 (ref 0.7–1.7)
Albumin ELP: 3.5 g/dL (ref 2.9–4.4)
Alpha-1-Globulin: 0.2 g/dL (ref 0.0–0.4)
Alpha-2-Globulin: 0.6 g/dL (ref 0.4–1.0)
Beta Globulin: 1 g/dL (ref 0.7–1.3)
Gamma Globulin: 0.7 g/dL (ref 0.4–1.8)
Globulin, Total: 2.5 g/dL (ref 2.2–3.9)
Total Protein ELP: 6 g/dL (ref 6.0–8.5)

## 2020-02-06 LAB — IGG, IGA, IGM
IgA: 12 mg/dL — ABNORMAL LOW (ref 87–352)
IgG (Immunoglobin G), Serum: 537 mg/dL — ABNORMAL LOW (ref 586–1602)
IgM (Immunoglobulin M), Srm: 111 mg/dL (ref 26–217)

## 2020-02-06 LAB — KAPPA/LAMBDA LIGHT CHAINS
Kappa free light chain: 11.5 mg/L (ref 3.3–19.4)
Kappa, lambda light chain ratio: 4.6 — ABNORMAL HIGH (ref 0.26–1.65)
Lambda free light chains: 2.5 mg/L — ABNORMAL LOW (ref 5.7–26.3)

## 2020-02-09 ENCOUNTER — Telehealth: Payer: Self-pay

## 2020-02-09 MED FILL — IMBRUVICA 420 MG TAB: 420 | 28 days supply | Qty: 28 | Fill #9

## 2020-02-09 NOTE — Telephone Encounter (Signed)
Pt called to r/s her 12/30 appt as she cannot be off work, done, AOM

## 2020-03-08 ENCOUNTER — Telehealth: Payer: Self-pay | Admitting: Pharmacy Technician

## 2020-03-08 MED FILL — IMBRUVICA 420 MG TAB: 420 | 28 days supply | Qty: 28 | Fill #10

## 2020-03-08 NOTE — Telephone Encounter (Signed)
Oral Oncology Patient Advocate Encounter  Received notification from [place] that prior authorization for Imbruvica is required.  PA submitted on CoverMyMeds Key [id number] Status is pending  Oral Oncology Clinic will continue to follow.  [place] Patient [place] Phone [phone] Fax [phone] 03/08/2020 12:35 PM

## 2020-03-08 NOTE — Telephone Encounter (Signed)
Oral Oncology Patient Advocate Encounter  Prior Authorization for Kristy [name] has been approved.    PA# [id number] Effective dates: 02/07/20 through 03/08/21  Patients co-pay is $0.00  Oral Oncology Clinic will continue to follow.   [name] Patient [place] Phone [phone] Fax [phone] 03/08/2020 1:31 PM

## 2020-03-10 ENCOUNTER — Inpatient Hospital Stay: Payer: Federal, State, Local not specified - PPO | Attending: Hematology & Oncology

## 2020-03-10 ENCOUNTER — Other Ambulatory Visit: Payer: Self-pay

## 2020-03-10 ENCOUNTER — Inpatient Hospital Stay: Payer: Federal, State, Local not specified - PPO

## 2020-03-10 ENCOUNTER — Inpatient Hospital Stay (HOSPITAL_BASED_OUTPATIENT_CLINIC_OR_DEPARTMENT_OTHER): Payer: Federal, State, Local not specified - PPO | Admitting: Hematology & Oncology

## 2020-03-10 ENCOUNTER — Encounter: Payer: Self-pay | Admitting: Hematology & Oncology

## 2020-03-10 VITALS — BP 154/64 | HR 71 | Temp 98.0°F | Resp 20 | Wt 113.1 lb

## 2020-03-10 DIAGNOSIS — C880 Waldenstrom macroglobulinemia: Secondary | ICD-10-CM

## 2020-03-10 DIAGNOSIS — D801 Nonfamilial hypogammaglobulinemia: Secondary | ICD-10-CM | POA: Insufficient documentation

## 2020-03-10 DIAGNOSIS — D510 Vitamin B12 deficiency anemia due to intrinsic factor deficiency: Secondary | ICD-10-CM | POA: Insufficient documentation

## 2020-03-10 DIAGNOSIS — J0101 Acute recurrent maxillary sinusitis: Secondary | ICD-10-CM

## 2020-03-10 DIAGNOSIS — J329 Chronic sinusitis, unspecified: Secondary | ICD-10-CM | POA: Diagnosis not present

## 2020-03-10 LAB — CMP (CANCER CENTER ONLY)
ALT: 12 U/L (ref 0–44)
AST: 18 U/L (ref 15–41)
Albumin: 4.1 g/dL (ref 3.5–5.0)
Alkaline Phosphatase: 50 U/L (ref 38–126)
Anion gap: 7 (ref 5–15)
BUN: 11 mg/dL (ref 8–23)
CO2: 28 mmol/L (ref 22–32)
Calcium: 9.2 mg/dL (ref 8.9–10.3)
Chloride: 105 mmol/L (ref 98–111)
Creatinine: 0.76 mg/dL (ref 0.44–1.00)
GFR, Estimated: >60 mL/min (ref >60)
Glucose, Bld: 83 mg/dL (ref 70–99)
Potassium: 4.4 mmol/L (ref 3.5–5.1)
Sodium: 140 mmol/L (ref 135–145)
Total Bilirubin: 1 mg/dL (ref 0.3–1.2)
Total Protein: 6.2 g/dL — ABNORMAL LOW (ref 6.5–8.1)

## 2020-03-10 LAB — CBC WITH DIFFERENTIAL (CANCER CENTER ONLY)
Abs Immature Granulocytes: 0.08 10*3/uL — ABNORMAL HIGH (ref 0.00–0.07)
Basophils Absolute: 0 10*3/uL (ref 0.0–0.1)
Basophils Relative: 1 %
Eosinophils Absolute: 0.1 10*3/uL (ref 0.0–0.5)
Eosinophils Relative: 1 %
HCT: 40.2 % (ref 36.0–46.0)
Hemoglobin: 13.5 g/dL (ref 12.0–15.0)
Immature Granulocytes: 1 %
Lymphocytes Relative: 28 %
Lymphs Abs: 2.1 10*3/uL (ref 0.7–4.0)
MCH: 34.8 pg — ABNORMAL HIGH (ref 26.0–34.0)
MCHC: 33.6 g/dL (ref 30.0–36.0)
MCV: 103.6 fL — ABNORMAL HIGH (ref 80.0–100.0)
Monocytes Absolute: 0.6 10*3/uL (ref 0.1–1.0)
Monocytes Relative: 8 %
Neutro Abs: 4.5 10*3/uL (ref 1.7–7.7)
Neutrophils Relative %: 61 %
Platelet Count: 131 10*3/uL — ABNORMAL LOW (ref 150–400)
RBC: 3.88 MIL/uL (ref 3.87–5.11)
RDW: 12.2 % (ref 11.5–15.5)
WBC Count: 7.3 10*3/uL (ref 4.0–10.5)
nRBC: 0 % (ref 0.0–0.2)

## 2020-03-10 LAB — VITAMIN B12: Vitamin B-12: 207 pg/mL (ref 180–914)

## 2020-03-10 LAB — LACTATE DEHYDROGENASE: LDH: 183 U/L (ref 98–192)

## 2020-03-10 MED ORDER — DIPHENHYDRAMINE HCL 25 MG PO CAPS
25.0000 mg | ORAL_CAPSULE | Freq: Once | ORAL | Status: AC
  Administered 2020-03-10: 25 mg via ORAL

## 2020-03-10 MED ORDER — CYANOCOBALAMIN 1000 MCG/ML IJ SOLN
INTRAMUSCULAR | Status: AC
  Filled 2020-03-10: qty 1

## 2020-03-10 MED ORDER — ACETAMINOPHEN 325 MG PO TABS
ORAL_TABLET | ORAL | Status: AC
  Filled 2020-03-10: qty 2

## 2020-03-10 MED ORDER — IMMUNE GLOBULIN (HUMAN) 20 GM/200ML IV SOLN
40.0000 g | Freq: Once | INTRAVENOUS | Status: AC
  Administered 2020-03-10: 40 g via INTRAVENOUS
  Filled 2020-03-10: qty 400

## 2020-03-10 MED ORDER — ACETAMINOPHEN 325 MG PO TABS
650.0000 mg | ORAL_TABLET | Freq: Once | ORAL | Status: AC
  Administered 2020-03-10: 650 mg via ORAL

## 2020-03-10 MED ORDER — CYANOCOBALAMIN 1000 MCG/ML IJ SOLN
1000.0000 ug | Freq: Once | INTRAMUSCULAR | Status: AC
  Administered 2020-03-10: 1000 ug via INTRAMUSCULAR

## 2020-03-10 MED ORDER — DEXTROSE 5 % IV SOLN
INTRAVENOUS | Status: DC
  Filled 2020-03-10: qty 250

## 2020-03-10 MED ORDER — DIPHENHYDRAMINE HCL 25 MG PO CAPS
ORAL_CAPSULE | ORAL | Status: AC
  Filled 2020-03-10: qty 1

## 2020-03-10 NOTE — Patient Instructions (Signed)
Immune Globulin Injection What is this medicine? IMMUNE GLOBULIN (im MUNE GLOB yoo lin) helps to prevent or reduce the severity of certain infections in patients who are at risk. This medicine is collected from the pooled blood of many donors. It is used to treat immune system problems, thrombocytopenia, and Kawasaki syndrome. This medicine may be used for other purposes; ask your health care provider or pharmacist if you have questions. COMMON BRAND NAME(S): ASCENIV, Baygam, BIVIGAM, Carimune, Carimune NF, cutaquig, Cuvitru, Flebogamma, Flebogamma DIF, GamaSTAN, GamaSTAN S/D, Gamimune N, Gammagard, Gammagard S/D, Gammaked, Gammaplex, Gammar-P IV, Gamunex, Gamunex-C, Hizentra, Iveegam, Iveegam EN, Octagam, Panglobulin, Panglobulin NF, panzyga, Polygam S/D, Privigen, Sandoglobulin, Venoglobulin-S, Vigam, Vivaglobulin, Xembify What should I tell my health care provider before I take this medicine? They need to know if you have any of these conditions:  diabetes  extremely low or no immune antibodies in the blood  heart disease  history of blood clots  hyperprolinemia  infection in the blood, sepsis  kidney disease  recently received or scheduled to receive a vaccination  an unusual or allergic reaction to human immune globulin, albumin, maltose, sucrose, other medicines, foods, dyes, or preservatives  pregnant or trying to get pregnant  breast-feeding How should I use this medicine? This medicine is for injection into a muscle or infusion into a vein or skin. It is usually given by a health care professional in a hospital or clinic setting. In rare cases, some brands of this medicine might be given at home. You will be taught how to give this medicine. Use exactly as directed. Take your medicine at regular intervals. Do not take your medicine more often than directed. Talk to your pediatrician regarding the use of this medicine in children. While this drug may be prescribed for selected  conditions, precautions do apply. Overdosage: If you think you have taken too much of this medicine contact a poison control center or emergency room at once. NOTE: This medicine is only for you. Do not share this medicine with others. What if I miss a dose? It is important not to miss your dose. Call your doctor or health care professional if you are unable to keep an appointment. If you give yourself the medicine and you miss a dose, take it as soon as you can. If it is almost time for your next dose, take only that dose. Do not take double or extra doses. What may interact with this medicine?  aspirin and aspirin-like medicines  cisplatin  cyclosporine  medicines for infection like acyclovir, adefovir, amphotericin B, bacitracin, cidofovir, foscarnet, ganciclovir, gentamicin, pentamidine, vancomycin  NSAIDS, medicines for pain and inflammation, like ibuprofen or naproxen  pamidronate  vaccines  zoledronic acid This list may not describe all possible interactions. Give your health care provider a list of all the medicines, herbs, non-prescription drugs, or dietary supplements you use. Also tell them if you smoke, drink alcohol, or use illegal drugs. Some items may interact with your medicine. What should I watch for while using this medicine? Your condition will be monitored carefully while you are receiving this medicine. This medicine is made from pooled blood donations of many different people. It may be possible to pass an infection in this medicine. However, the donors are screened for infections and all products are tested for HIV and hepatitis. The medicine is treated to kill most or all bacteria and viruses. Talk to your doctor about the risks and benefits of this medicine. Do not have vaccinations for at least  14 days before, or until at least 3 months after receiving this medicine. What side effects may I notice from receiving this medicine? Side effects that you should report  to your doctor or health care professional as soon as possible:  allergic reactions like skin rash, itching or hives, swelling of the face, lips, or tongue  blue colored lips or skin  breathing problems  chest pain or tightness  fever  signs and symptoms of aseptic meningitis such as stiff neck; sensitivity to light; headache; drowsiness; fever; nausea; vomiting; rash  signs and symptoms of a blood clot such as chest pain; shortness of breath; pain, swelling, or warmth in the leg  signs and symptoms of hemolytic anemia such as fast heartbeat; tiredness; dark yellow or brown urine; or yellowing of the eyes or skin  signs and symptoms of kidney injury like trouble passing urine or change in the amount of urine  sudden weight gain  swelling of the ankles, feet, hands Side effects that usually do not require medical attention (report to your doctor or health care professional if they continue or are bothersome):  diarrhea  flushing  headache  increased sweating  joint pain  muscle cramps  muscle pain  nausea  pain, redness, or irritation at site where injected  tiredness This list may not describe all possible side effects. Call your doctor for medical advice about side effects. You may report side effects to FDA at 1-800-FDA-1088. Where should I keep my medicine? Keep out of the reach of children. This drug is usually given in a hospital or clinic and will not be stored at home. In rare cases, some brands of this medicine may be given at home. If you are using this medicine at home, you will be instructed on how to store this medicine. Throw away any unused medicine after the expiration date on the label. NOTE: This sheet is a summary. It may not cover all possible information. If you have questions about this medicine, talk to your doctor, pharmacist, or health care provider.  2020 Elsevier/Gold Standard (2018-10-09 12:51:14)        Cyanocobalamin, Vitamin B12  injection What is this medicine? CYANOCOBALAMIN (sye an oh koe BAL a min) is a man made form of vitamin B12. Vitamin B12 is used in the growth of healthy blood cells, nerve cells, and proteins in the body. It also helps with the metabolism of fats and carbohydrates. This medicine is used to treat people who can not absorb vitamin B12. This medicine may be used for other purposes; ask your health care provider or pharmacist if you have questions. COMMON BRAND NAME(S): B-12 Compliance Kit, B-12 Injection Kit, Cyomin, LA-12, Nutri-Twelve, Physicians EZ Use B-12, Primabalt What should I tell my health care provider before I take this medicine? They need to know if you have any of these conditions:  kidney disease  Leber's disease  megaloblastic anemia  an unusual or allergic reaction to cyanocobalamin, cobalt, other medicines, foods, dyes, or preservatives  pregnant or trying to get pregnant  breast-feeding How should I use this medicine? This medicine is injected into a muscle or deeply under the skin. It is usually given by a health care professional in a clinic or doctor's office. However, your doctor may teach you how to inject yourself. Follow all instructions. Talk to your pediatrician regarding the use of this medicine in children. Special care may be needed. Overdosage: If you think you have taken too much of this medicine contact a  poison control center or emergency room at once. NOTE: This medicine is only for you. Do not share this medicine with others. What if I miss a dose? If you are given your dose at a clinic or doctor's office, call to reschedule your appointment. If you give your own injections and you miss a dose, take it as soon as you can. If it is almost time for your next dose, take only that dose. Do not take double or extra doses. What may interact with this medicine?  colchicine  heavy alcohol intake This list may not describe all possible interactions. Give your  health care provider a list of all the medicines, herbs, non-prescription drugs, or dietary supplements you use. Also tell them if you smoke, drink alcohol, or use illegal drugs. Some items may interact with your medicine. What should I watch for while using this medicine? Visit your doctor or health care professional regularly. You may need blood work done while you are taking this medicine. You may need to follow a special diet. Talk to your doctor. Limit your alcohol intake and avoid smoking to get the best benefit. What side effects may I notice from receiving this medicine? Side effects that you should report to your doctor or health care professional as soon as possible:  allergic reactions like skin rash, itching or hives, swelling of the face, lips, or tongue  blue tint to skin  chest tightness, pain  difficulty breathing, wheezing  dizziness  red, swollen painful area on the leg Side effects that usually do not require medical attention (report to your doctor or health care professional if they continue or are bothersome):  diarrhea  headache This list may not describe all possible side effects. Call your doctor for medical advice about side effects. You may report side effects to FDA at 1-800-FDA-1088. Where should I keep my medicine? Keep out of the reach of children. Store at room temperature between 15 and 30 degrees C (59 and 85 degrees F). Protect from light. Throw away any unused medicine after the expiration date. NOTE: This sheet is a summary. It may not cover all possible information. If you have questions about this medicine, talk to your doctor, pharmacist, or health care provider.  2020 Elsevier/Gold Standard (2007-06-17 22:10:20)

## 2020-03-10 NOTE — Progress Notes (Signed)
Pt discharged in no apparent distress. Pt left ambulatory without assistance. Pt aware of discharge instructions and verbalized understanding and had no further questions.  

## 2020-03-10 NOTE — Progress Notes (Signed)
Hematology and Oncology Follow Up Visit  Kristy [name] [id number] 12-30-1955 63 y.o. 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  She really looks great.  She had a wonderful Thanksgiving.  She now is in forward to Christmas.  When we last saw her, there is no monoclonal spike in her blood.  Her IgM level was 111 mg/dL.  She really has done well with the Imbruvica.  She has had no issues with this.  Is been no palpitations.  She has had no problems with nausea or vomiting.  She has had no issues with fever.  There is no problems with cough or shortness of breath.  She has had no rashes.  There is no change in bowel or bladder habits.  I am just happy that she is done so well this year.  Hopefully, we will be able to move her treatments out a little bit longer.  We will have to talk with her next time we see her.  Overall, her performance status is ECOG 0.    Medications:  Allergies as of 03/10/2020   No Known Allergies     Medication List       Accurate as of March 10, 2020  9:28 AM. If you have any questions, ask your nurse or doctor.        cyanocobalamin 1000 MCG/ML injection Commonly known as: (VITAMIN B-12) INJECT 1 ML INTO THE MUSCLE EVERY 2 MONTHS   Imbruvica 420 MG Tabs Generic drug: ibrutinib TAKE 1 TABLET BY MOUTH DAILY AFTER BREAKFAST.   Lifitegrast 5 % Soln Apply 1 drop to eye 2 (two) times daily.   Vitamin D3 25 MCG tablet Commonly known as: Vitamin D Take by mouth daily.       Allergies: No Known Allergies  Past Medical History, Surgical history, Social history, and Family History were reviewed and updated.  Review of Systems: Review of Systems   Constitutional: Negative.   HENT: Negative.   Eyes: Negative.   Respiratory: Negative.   Cardiovascular: Negative.   Gastrointestinal: Negative.   Genitourinary: Negative.   Musculoskeletal: Negative.   Skin: Negative.   Neurological: Negative.   Endo/Heme/Allergies: Negative.   Psychiatric/Behavioral: The patient is nervous/anxious.       Physical Exam:  weight is 113 lb 1.9 oz (51.3 kg). Her oral temperature is 98 F (36.7 C). Her blood pressure is 154/64 (abnormal) and her pulse is 71. Her respiration is 20 and oxygen saturation is 98%.   Wt Readings from Last 3 Encounters:  03/10/20 113 lb 1.9 oz (51.3 kg)  02/05/20 113 lb 6.4 oz (51.4 kg)  12/19/19 115 lb (52.2 kg)    Physical Exam Vitals reviewed.  HENT:     Head: Normocephalic and atraumatic.  Eyes:     Pupils: Pupils are equal, round, and reactive to light.  Cardiovascular:     Rate and Rhythm: Normal rate and regular rhythm.     Heart sounds: Normal heart sounds.  Pulmonary:     Effort: Pulmonary effort is normal.     Breath sounds: Normal breath sounds.  Abdominal:     General: Bowel sounds are normal.     Palpations: Abdomen is soft.  Musculoskeletal:  General: No tenderness or deformity. Normal range of motion.     Cervical back: Normal range of motion.  Lymphadenopathy:     Cervical: No cervical adenopathy.  Skin:    General: Skin is warm and dry.     Findings: No erythema or rash.  Neurological:     Mental Status: She is alert and oriented to person, place, and time.  Psychiatric:        Behavior: Behavior normal.        Thought Content: Thought content normal.        Judgment: Judgment normal.      Lab Results  Component Value Date   WBC 7.3 03/10/2020   HGB 13.5 03/10/2020   HCT 40.2 03/10/2020   MCV 103.6 (H) 03/10/2020   PLT 131 (L) 03/10/2020   No results found for: FERRITIN, IRON, TIBC, UIBC, IRONPCTSAT Lab Results  Component Value Date   RETICCTPCT 2.9 (H) 10/30/2014    RBC 3.88 03/10/2020   RETICCTABS 98.3 10/30/2014   Lab Results  Component Value Date   KPAFRELGTCHN 11.5 02/05/2020   LAMBDASER 2.5 (L) 02/05/2020   KAPLAMBRATIO 4.60 (H) 02/05/2020   Lab Results  Component Value Date   IGGSERUM 537 (L) 02/05/2020   IGA 12 (L) 02/05/2020   IGMSERUM 111 02/05/2020   Lab Results  Component Value Date   TOTALPROTELP 6.0 02/05/2020   ALBUMINELP 3.5 02/05/2020   A1GS 0.2 02/05/2020   A2GS 0.6 02/05/2020   BETS 1.0 02/05/2020   BETA2SER 0.2 02/15/2015   GAMS 0.7 02/05/2020   MSPIKE Not Observed 02/05/2020   SPEI Comment 11/12/2018     Chemistry      Component Value Date/Time   NA 140 03/10/2020 0823   NA 147 (H) 03/19/2017 0849   NA 139 03/16/2016 1337   K 4.4 03/10/2020 0823   K 4.5 03/19/2017 0849   K 3.8 03/16/2016 1337   CL 105 03/10/2020 0823   CL 108 03/19/2017 0849   CO2 28 03/10/2020 0823   CO2 31 03/19/2017 0849   CO2 27 03/16/2016 1337   BUN 11 03/10/2020 0823   BUN 11 03/19/2017 0849   BUN 5.2 (L) 03/16/2016 1337   CREATININE 0.76 03/10/2020 0823   CREATININE 1.0 03/19/2017 0849   CREATININE 0.7 03/16/2016 1337      Component Value Date/Time   CALCIUM 9.2 03/10/2020 0823   CALCIUM 9.6 03/19/2017 0849   CALCIUM 8.8 03/16/2016 1337   ALKPHOS 50 03/10/2020 0823   ALKPHOS 53 03/19/2017 0849   ALKPHOS 62 03/16/2016 1337   AST 18 03/10/2020 0823   AST 24 03/16/2016 1337   ALT 12 03/10/2020 0823   ALT 23 03/19/2017 0849   ALT 19 03/16/2016 1337   BILITOT 1.0 03/10/2020 0823   BILITOT 0.57 03/16/2016 1337       Impression and Plan: Kristy [name] is a very pleasant 64 yo caucasian female with Waldenstrom's with acquired hypogammaglobulinemia and recurrent sinusitis.   We will proceed with IVIG today as planned.   She will get her vitamin B12 today.  She likes having it done in the office.  For right now, we will keep the appointments every 6 weeks.  Maybe next year, we can go to every 2 months.  I am just happy that  she is looking forward to a wonderful Christmas with her family.   [name], MD 12/22/20219:28 AM

## 2020-03-11 LAB — KAPPA/LAMBDA LIGHT CHAINS
Kappa free light chain: 12.1 mg/L (ref 3.3–19.4)
Kappa, lambda light chain ratio: 4.84 — ABNORMAL HIGH (ref 0.26–1.65)
Lambda free light chains: 2.5 mg/L — ABNORMAL LOW (ref 5.7–26.3)

## 2020-03-11 LAB — IGG, IGA, IGM
IgA: 12 mg/dL — ABNORMAL LOW (ref 87–352)
IgG (Immunoglobin G), Serum: 640 mg/dL (ref 586–1602)
IgM (Immunoglobulin M), Srm: 104 mg/dL (ref 26–217)

## 2020-03-13 LAB — PROTEIN ELECTROPHORESIS, SERUM
A/G Ratio: 1.8 — ABNORMAL HIGH (ref 0.7–1.7)
Albumin ELP: 4 g/dL (ref 2.9–4.4)
Alpha-1-Globulin: 0.2 g/dL (ref 0.0–0.4)
Alpha-2-Globulin: 0.5 g/dL (ref 0.4–1.0)
Beta Globulin: 0.8 g/dL (ref 0.7–1.3)
Gamma Globulin: 0.7 g/dL (ref 0.4–1.8)
Globulin, Total: 2.2 g/dL (ref 2.2–3.9)
Total Protein ELP: 6.2 g/dL (ref 6.0–8.5)

## 2020-03-18 ENCOUNTER — Inpatient Hospital Stay: Payer: Federal, State, Local not specified - PPO

## 2020-03-18 ENCOUNTER — Inpatient Hospital Stay: Payer: Federal, State, Local not specified - PPO | Admitting: Family

## 2020-03-18 ENCOUNTER — Ambulatory Visit: Payer: Federal, State, Local not specified - PPO

## 2020-03-18 ENCOUNTER — Ambulatory Visit: Payer: Federal, State, Local not specified - PPO | Admitting: Hematology & Oncology

## 2020-03-18 ENCOUNTER — Inpatient Hospital Stay: Payer: Federal, State, Local not specified - PPO | Admitting: Hematology & Oncology

## 2020-03-18 ENCOUNTER — Other Ambulatory Visit: Payer: Federal, State, Local not specified - PPO

## 2020-03-18 ENCOUNTER — Ambulatory Visit: Payer: Federal, State, Local not specified - PPO | Admitting: Family

## 2020-04-01 MED FILL — IMBRUVICA 420 MG TAB: 420 | 28 days supply | Qty: 28 | Fill #11

## 2020-04-22 ENCOUNTER — Other Ambulatory Visit: Payer: Self-pay | Admitting: *Deleted

## 2020-04-22 ENCOUNTER — Inpatient Hospital Stay: Payer: Federal, State, Local not specified - PPO | Admitting: Hematology & Oncology

## 2020-04-22 ENCOUNTER — Inpatient Hospital Stay: Payer: Federal, State, Local not specified - PPO

## 2020-04-22 ENCOUNTER — Inpatient Hospital Stay: Payer: Federal, State, Local not specified - PPO | Attending: Hematology & Oncology

## 2020-04-22 ENCOUNTER — Other Ambulatory Visit: Payer: Self-pay

## 2020-04-22 ENCOUNTER — Encounter: Payer: Self-pay | Admitting: Hematology & Oncology

## 2020-04-22 VITALS — BP 123/59 | HR 86 | Temp 98.9°F | Resp 18

## 2020-04-22 VITALS — BP 112/84 | HR 88 | Temp 98.3°F | Resp 20 | Wt 111.8 lb

## 2020-04-22 DIAGNOSIS — N390 Urinary tract infection, site not specified: Secondary | ICD-10-CM

## 2020-04-22 DIAGNOSIS — C880 Waldenstrom macroglobulinemia: Secondary | ICD-10-CM

## 2020-04-22 DIAGNOSIS — J329 Chronic sinusitis, unspecified: Secondary | ICD-10-CM | POA: Diagnosis not present

## 2020-04-22 DIAGNOSIS — D801 Nonfamilial hypogammaglobulinemia: Secondary | ICD-10-CM | POA: Insufficient documentation

## 2020-04-22 DIAGNOSIS — D510 Vitamin B12 deficiency anemia due to intrinsic factor deficiency: Secondary | ICD-10-CM | POA: Diagnosis present

## 2020-04-22 DIAGNOSIS — J0101 Acute recurrent maxillary sinusitis: Secondary | ICD-10-CM

## 2020-04-22 LAB — CMP (CANCER CENTER ONLY)
ALT: 13 U/L (ref 0–44)
AST: 19 U/L (ref 15–41)
Albumin: 4.3 g/dL (ref 3.5–5.0)
Alkaline Phosphatase: 54 U/L (ref 38–126)
Anion gap: 7 (ref 5–15)
BUN: 13 mg/dL (ref 8–23)
CO2: 28 mmol/L (ref 22–32)
Calcium: 9.3 mg/dL (ref 8.9–10.3)
Chloride: 103 mmol/L (ref 98–111)
Creatinine: 0.81 mg/dL (ref 0.44–1.00)
GFR, Estimated: >60 mL/min (ref >60)
Glucose, Bld: 101 mg/dL — ABNORMAL HIGH (ref 70–99)
Potassium: 4.4 mmol/L (ref 3.5–5.1)
Sodium: 138 mmol/L (ref 135–145)
Total Bilirubin: 1.8 mg/dL — ABNORMAL HIGH (ref 0.3–1.2)
Total Protein: 6.2 g/dL — ABNORMAL LOW (ref 6.5–8.1)

## 2020-04-22 LAB — CBC WITH DIFFERENTIAL (CANCER CENTER ONLY)
Abs Immature Granulocytes: 0.05 10*3/uL (ref 0.00–0.07)
Basophils Absolute: 0 10*3/uL (ref 0.0–0.1)
Basophils Relative: 0 %
Eosinophils Absolute: 0.1 10*3/uL (ref 0.0–0.5)
Eosinophils Relative: 1 %
HCT: 40.2 % (ref 36.0–46.0)
Hemoglobin: 13.8 g/dL (ref 12.0–15.0)
Immature Granulocytes: 1 %
Lymphocytes Relative: 23 %
Lymphs Abs: 2.2 10*3/uL (ref 0.7–4.0)
MCH: 35.2 pg — ABNORMAL HIGH (ref 26.0–34.0)
MCHC: 34.3 g/dL (ref 30.0–36.0)
MCV: 102.6 fL — ABNORMAL HIGH (ref 80.0–100.0)
Monocytes Absolute: 0.9 10*3/uL (ref 0.1–1.0)
Monocytes Relative: 9 %
Neutro Abs: 6.4 10*3/uL (ref 1.7–7.7)
Neutrophils Relative %: 66 %
Platelet Count: 123 10*3/uL — ABNORMAL LOW (ref 150–400)
RBC: 3.92 MIL/uL (ref 3.87–5.11)
RDW: 12.6 % (ref 11.5–15.5)
WBC Count: 9.6 10*3/uL (ref 4.0–10.5)
nRBC: 0 % (ref 0.0–0.2)

## 2020-04-22 LAB — URINALYSIS, COMPLETE (UACMP) WITH MICROSCOPIC
Bilirubin Urine: NEGATIVE
Glucose, UA: NEGATIVE mg/dL
Ketones, ur: NEGATIVE mg/dL
Nitrite: NEGATIVE
Protein, ur: NEGATIVE mg/dL
Specific Gravity, Urine: 1.005 (ref 1.005–1.030)
pH: 7 (ref 5.0–8.0)

## 2020-04-22 MED ORDER — CYANOCOBALAMIN 1000 MCG/ML IJ SOLN
INTRAMUSCULAR | Status: AC
  Filled 2020-04-22: qty 1

## 2020-04-22 MED ORDER — IMMUNE GLOBULIN (HUMAN) 20 GM/200ML IV SOLN
40.0000 g | Freq: Once | INTRAVENOUS | Status: AC
  Administered 2020-04-22: 40 g via INTRAVENOUS
  Filled 2020-04-22: qty 400

## 2020-04-22 MED ORDER — CYANOCOBALAMIN 1000 MCG/ML IJ SOLN
1000.0000 ug | Freq: Once | INTRAMUSCULAR | Status: AC
  Administered 2020-04-22: 1000 ug via INTRAMUSCULAR

## 2020-04-22 MED ORDER — ACETAMINOPHEN 325 MG PO TABS
650.0000 mg | ORAL_TABLET | Freq: Once | ORAL | Status: AC
  Administered 2020-04-22: 650 mg via ORAL

## 2020-04-22 MED ORDER — DEXTROSE 5 % IV SOLN
INTRAVENOUS | Status: DC
  Filled 2020-04-22: qty 250

## 2020-04-22 MED ORDER — DIPHENHYDRAMINE HCL 25 MG PO CAPS
ORAL_CAPSULE | ORAL | Status: AC
  Filled 2020-04-22: qty 1

## 2020-04-22 MED ORDER — DIPHENHYDRAMINE HCL 25 MG PO CAPS
25.0000 mg | ORAL_CAPSULE | Freq: Once | ORAL | Status: AC
  Administered 2020-04-22: 25 mg via ORAL

## 2020-04-22 MED ORDER — ACETAMINOPHEN 325 MG PO TABS
ORAL_TABLET | ORAL | Status: AC
  Filled 2020-04-22: qty 2

## 2020-04-22 NOTE — Progress Notes (Signed)
Pt discharged in no apparent distress. Pt left ambulatory without assistance. Pt aware of discharge instructions and verbalized understanding and had no further questions.Stable and ASX upon discharge.  

## 2020-04-22 NOTE — Progress Notes (Signed)
Hematology and Oncology Follow Up Visit  Kristy [name] [id number] 07/20/1955 65 y.o. 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  Apparently, the problem she has been having is a urinary tract infection.  She has seen her family doctor for this.  She was recently put on some antibiotics.  I am not sure what antibiotic she was put on.  I am not sure exactly if she has a urinary tract infection.  Apparently, she was told by some doctor that this was all because of her Waldenstrm's.  She was told that she is putting out a lot of protein in her urine.  I find that hard to believe.  I told her that I just do not think that the warm assumes protein would be filtered by the kidney.  We will do a urinalysis on her today.  As far as the Waldenstrm's is concerned, there is no monoclonal spike in her blood blood so that we saw her.  Her IgM level was only 104 mg/dL.  Again, I just have a hard time believing that this would even be a problem with respect to her urine.  It sounds like she probably needs to go to urology and maybe have a cystoscopy.  She is still working.  She might retire.  Her husband is doing well.  He might retire this year.  Overall, her performance status is ECOG 1.    Medications:  Allergies as of 04/22/2020   No Known Allergies     Medication List       Accurate as of April 22, 2020 11:05 AM. If you have any questions, ask your nurse or doctor.        amoxicillin-clavulanate 875-125 MG tablet Commonly known as: AUGMENTIN Take 1 tablet by mouth 2 (two) times daily.   cyanocobalamin 1000 MCG/ML injection Commonly known as: (VITAMIN B-12) INJECT 1 ML INTO THE MUSCLE EVERY 2  MONTHS   Imbruvica 420 MG Tabs Generic drug: ibrutinib TAKE 1 TABLET BY MOUTH DAILY AFTER BREAKFAST.   Lifitegrast 5 % Soln Apply 1 drop to eye 2 (two) times daily.   Vitamin D3 25 MCG tablet Commonly known as: Vitamin D Take by mouth daily.       Allergies: No Known Allergies  Past Medical History, Surgical history, Social history, and Family History were reviewed and updated.  Review of Systems: Review of Systems  Constitutional: Negative.   HENT: Negative.   Eyes: Negative.   Respiratory: Negative.   Cardiovascular: Negative.   Gastrointestinal: Negative.   Genitourinary: Negative.   Musculoskeletal: Negative.   Skin: Negative.   Neurological: Negative.   Endo/Heme/Allergies: Negative.   Psychiatric/Behavioral: The patient is nervous/anxious.       Physical Exam:  weight is 111 lb 12.8 oz (50.7 kg). Her oral temperature is 98.3 F (36.8 C). Her blood pressure is 112/84 and her pulse is 88. Her respiration is 20 and oxygen saturation is 98%.   Wt Readings from Last 3 Encounters:  04/22/20 111 lb 12.8 oz (50.7 kg)  03/10/20 113 lb 1.9 oz (51.3 kg)  02/05/20 113 lb 6.4 oz (51.4 kg)    Physical Exam Vitals reviewed.  HENT:     Head: Normocephalic and atraumatic.  Eyes:     Pupils: Pupils are equal, round, and reactive to light.  Cardiovascular:     Rate and Rhythm: Normal rate and regular rhythm.     Heart sounds: Normal heart sounds.  Pulmonary:     Effort: Pulmonary effort is normal.     Breath sounds: Normal breath sounds.  Abdominal:     General: Bowel sounds are normal.     Palpations: Abdomen is soft.  Musculoskeletal:        General: No tenderness or deformity. Normal range of motion.     Cervical back: Normal range of motion.  Lymphadenopathy:     Cervical: No cervical adenopathy.  Skin:    General: Skin is warm and dry.     Findings: No erythema or rash.  Neurological:     Mental Status: She is alert and oriented to person, place, and  time.  Psychiatric:        Behavior: Behavior normal.        Thought Content: Thought content normal.        Judgment: Judgment normal.      Lab Results  Component Value Date   WBC 9.6 04/22/2020   HGB 13.8 04/22/2020   HCT 40.2 04/22/2020   MCV 102.6 (H) 04/22/2020   PLT 123 (L) 04/22/2020   No results found for: FERRITIN, IRON, TIBC, UIBC, IRONPCTSAT Lab Results  Component Value Date   RETICCTPCT 2.9 (H) 10/30/2014   RBC 3.92 04/22/2020   RETICCTABS 98.3 10/30/2014   Lab Results  Component Value Date   KPAFRELGTCHN 12.1 03/10/2020   LAMBDASER 2.5 (L) 03/10/2020   KAPLAMBRATIO 4.84 (H) 03/10/2020   Lab Results  Component Value Date   IGGSERUM 640 03/10/2020   IGA 12 (L) 03/10/2020   IGMSERUM 104 03/10/2020   Lab Results  Component Value Date   TOTALPROTELP 6.2 03/10/2020   ALBUMINELP 4.0 03/10/2020   A1GS 0.2 03/10/2020   A2GS 0.5 03/10/2020   BETS 0.8 03/10/2020   BETA2SER 0.2 02/15/2015   GAMS 0.7 03/10/2020   MSPIKE Not Observed 03/10/2020   SPEI Comment 03/10/2020     Chemistry      Component Value Date/Time   NA 138 04/22/2020 0814   NA 147 (H) 03/19/2017 0849   NA 139 03/16/2016 1337   K 4.4 04/22/2020 0814   K 4.5 03/19/2017 0849   K 3.8 03/16/2016 1337   CL 103 04/22/2020 0814   CL 108 03/19/2017 0849   CO2 28 04/22/2020 0814   CO2 31 03/19/2017 0849   CO2 27 03/16/2016 1337   BUN 13 04/22/2020 0814   BUN 11 03/19/2017 0849   BUN 5.2 (L) 03/16/2016 1337   CREATININE 0.81 04/22/2020 0814   CREATININE 1.0 03/19/2017 0849   CREATININE 0.7 03/16/2016 1337      Component Value Date/Time   CALCIUM 9.3 04/22/2020 0814   CALCIUM 9.6 03/19/2017 0849   CALCIUM 8.8 03/16/2016 1337   ALKPHOS 54 04/22/2020 0814   ALKPHOS 53 03/19/2017 0849   ALKPHOS 62 03/16/2016 1337   AST 19 04/22/2020 0814   AST 24 03/16/2016 1337   ALT 13 04/22/2020 0814   ALT 23 03/19/2017 0849   ALT 19 03/16/2016 1337   BILITOT 1.8 (H) 04/22/2020 0814   BILITOT 0.57  03/16/2016 1337       Impression and Plan: Kristy [name] is a very pleasant 65 yo caucasian female with Waldenstrom's with acquired hypogammaglobulinemia and recurrent sinusitis.   We will proceed with IVIG  today as planned.   On her urinalysis, there is no protein in her urine  We will do a urine culture on her.  She does get vitamin B12.  As always, we we will have her come back in another 6 weeks or so.  [name], MD 2/3/202211:05 AM

## 2020-04-22 NOTE — Patient Instructions (Signed)
Immune Globulin Injection What is this medicine? IMMUNE GLOBULIN (im MUNE GLOB yoo lin) helps to prevent or reduce the severity of certain infections in patients who are at risk. This medicine is collected from the pooled blood of many donors. It is used to treat immune system problems, thrombocytopenia, and Kawasaki syndrome. This medicine may be used for other purposes; ask your health care provider or pharmacist if you have questions. COMMON BRAND NAME(S): ASCENIV, Baygam, BIVIGAM, Carimune, Carimune NF, cutaquig, Cuvitru, Flebogamma, Flebogamma DIF, GamaSTAN, GamaSTAN S/D, Gamimune N, Gammagard, Gammagard S/D, Gammaked, Gammaplex, Gammar-P IV, Gamunex, Gamunex-C, Hizentra, Iveegam, Iveegam EN, Octagam, Panglobulin, Panglobulin NF, panzyga, Polygam S/D, Privigen, Sandoglobulin, Venoglobulin-S, Vigam, Vivaglobulin, Xembify What should I tell my health care provider before I take this medicine? They need to know if you have any of these conditions:  diabetes  extremely low or no immune antibodies in the blood  heart disease  history of blood clots  hyperprolinemia  infection in the blood, sepsis  kidney disease  recently received or scheduled to receive a vaccination  an unusual or allergic reaction to human immune globulin, albumin, maltose, sucrose, other medicines, foods, dyes, or preservatives  pregnant or trying to get pregnant  breast-feeding How should I use this medicine? This medicine is for injection into a muscle or infusion into a vein or skin. It is usually given by a health care professional in a hospital or clinic setting. In rare cases, some brands of this medicine might be given at home. You will be taught how to give this medicine. Use exactly as directed. Take your medicine at regular intervals. Do not take your medicine more often than directed. Talk to your pediatrician regarding the use of this medicine in children. While this drug may be prescribed for selected  conditions, precautions do apply. Overdosage: If you think you have taken too much of this medicine contact a poison control center or emergency room at once. NOTE: This medicine is only for you. Do not share this medicine with others. What if I miss a dose? It is important not to miss your dose. Call your doctor or health care professional if you are unable to keep an appointment. If you give yourself the medicine and you miss a dose, take it as soon as you can. If it is almost time for your next dose, take only that dose. Do not take double or extra doses. What may interact with this medicine?  aspirin and aspirin-like medicines  cisplatin  cyclosporine  medicines for infection like acyclovir, adefovir, amphotericin B, bacitracin, cidofovir, foscarnet, ganciclovir, gentamicin, pentamidine, vancomycin  NSAIDS, medicines for pain and inflammation, like ibuprofen or naproxen  pamidronate  vaccines  zoledronic acid This list may not describe all possible interactions. Give your health care provider a list of all the medicines, herbs, non-prescription drugs, or dietary supplements you use. Also tell them if you smoke, drink alcohol, or use illegal drugs. Some items may interact with your medicine. What should I watch for while using this medicine? Your condition will be monitored carefully while you are receiving this medicine. This medicine is made from pooled blood donations of many different people. It may be possible to pass an infection in this medicine. However, the donors are screened for infections and all products are tested for HIV and hepatitis. The medicine is treated to kill most or all bacteria and viruses. Talk to your doctor about the risks and benefits of this medicine. Do not have vaccinations for at least   14 days before, or until at least 3 months after receiving this medicine. What side effects may I notice from receiving this medicine? Side effects that you should report  to your doctor or health care professional as soon as possible:  allergic reactions like skin rash, itching or hives, swelling of the face, lips, or tongue  blue colored lips or skin  breathing problems  chest pain or tightness  fever  signs and symptoms of aseptic meningitis such as stiff neck; sensitivity to light; headache; drowsiness; fever; nausea; vomiting; rash  signs and symptoms of a blood clot such as chest pain; shortness of breath; pain, swelling, or warmth in the leg  signs and symptoms of hemolytic anemia such as fast heartbeat; tiredness; dark yellow or brown urine; or yellowing of the eyes or skin  signs and symptoms of kidney injury like trouble passing urine or change in the amount of urine  sudden weight gain  swelling of the ankles, feet, hands Side effects that usually do not require medical attention (report to your doctor or health care professional if they continue or are bothersome):  diarrhea  flushing  headache  increased sweating  joint pain  muscle cramps  muscle pain  nausea  pain, redness, or irritation at site where injected  tiredness This list may not describe all possible side effects. Call your doctor for medical advice about side effects. You may report side effects to FDA at 1-800-FDA-1088. Where should I keep my medicine? Keep out of the reach of children. This drug is usually given in a hospital or clinic and will not be stored at home. In rare cases, some brands of this medicine may be given at home. If you are using this medicine at home, you will be instructed on how to store this medicine. Throw away any unused medicine after the expiration date on the label. NOTE: This sheet is a summary. It may not cover all possible information. If you have questions about this medicine, talk to your doctor, pharmacist, or health care provider.  2021 Elsevier/Gold Standard (2018-10-09 12:51:14)  

## 2020-04-23 ENCOUNTER — Telehealth: Payer: Self-pay | Admitting: *Deleted

## 2020-04-23 LAB — KAPPA/LAMBDA LIGHT CHAINS
Kappa free light chain: 8.9 mg/L (ref 3.3–19.4)
Kappa, lambda light chain ratio: 3.56 — ABNORMAL HIGH (ref 0.26–1.65)
Lambda free light chains: 2.5 mg/L — ABNORMAL LOW (ref 5.7–26.3)

## 2020-04-23 LAB — IGG, IGA, IGM
IgA: 13 mg/dL — ABNORMAL LOW (ref 87–352)
IgG (Immunoglobin G), Serum: 585 mg/dL — ABNORMAL LOW (ref 586–1602)
IgM (Immunoglobulin M), Srm: 107 mg/dL (ref 26–217)

## 2020-04-23 NOTE — Telephone Encounter (Signed)
Per los 04/22/20 patient was called of upcoming appts.

## 2020-04-23 NOTE — Telephone Encounter (Signed)
Called pt regarding email

## 2020-04-23 NOTE — Telephone Encounter (Signed)
LVM of upcoming appointments

## 2020-04-24 LAB — URINE CULTURE: Culture: NO GROWTH

## 2020-04-26 ENCOUNTER — Encounter: Payer: Self-pay | Admitting: *Deleted

## 2020-04-29 LAB — PROTEIN ELECTROPHORESIS, SERUM, WITH REFLEX
A/G Ratio: 1.7 (ref 0.7–1.7)
Albumin ELP: 3.8 g/dL (ref 2.9–4.4)
Alpha-1-Globulin: 0.2 g/dL (ref 0.0–0.4)
Alpha-2-Globulin: 0.5 g/dL (ref 0.4–1.0)
Beta Globulin: 0.9 g/dL (ref 0.7–1.3)
Gamma Globulin: 0.7 g/dL (ref 0.4–1.8)
Globulin, Total: 2.3 g/dL (ref 2.2–3.9)
M-Spike, %: 0.2 g/dL — ABNORMAL HIGH
SPEP Interpretation: 0
Total Protein ELP: 6.1 g/dL (ref 6.0–8.5)

## 2020-04-29 LAB — IMMUNOFIXATION REFLEX, SERUM
IgA: 17 mg/dL — ABNORMAL LOW (ref 87–352)
IgG (Immunoglobin G), Serum: 815 mg/dL (ref 586–1602)
IgM (Immunoglobulin M), Srm: 158 mg/dL (ref 26–217)

## 2020-04-29 MED FILL — IMBRUVICA 420 MG TAB: 420 | 28 days supply | Qty: 28 | Fill #12

## 2020-05-24 ENCOUNTER — Other Ambulatory Visit: Payer: Self-pay | Admitting: Hematology & Oncology

## 2020-05-28 MED FILL — IMBRUVICA 420 MG TAB: 420 | 28 days supply | Qty: 28 | Fill #0

## 2020-06-03 ENCOUNTER — Inpatient Hospital Stay: Payer: Federal, State, Local not specified - PPO | Attending: Hematology & Oncology

## 2020-06-03 ENCOUNTER — Inpatient Hospital Stay: Payer: Federal, State, Local not specified - PPO | Admitting: Family

## 2020-06-03 ENCOUNTER — Other Ambulatory Visit: Payer: Self-pay

## 2020-06-03 ENCOUNTER — Inpatient Hospital Stay: Payer: Federal, State, Local not specified - PPO

## 2020-06-03 ENCOUNTER — Encounter: Payer: Self-pay | Admitting: Family

## 2020-06-03 VITALS — BP 154/82 | HR 81 | Temp 97.9°F | Resp 17 | Wt 111.2 lb

## 2020-06-03 VITALS — BP 146/72 | HR 86 | Resp 17

## 2020-06-03 DIAGNOSIS — F419 Anxiety disorder, unspecified: Secondary | ICD-10-CM | POA: Diagnosis not present

## 2020-06-03 DIAGNOSIS — N390 Urinary tract infection, site not specified: Secondary | ICD-10-CM

## 2020-06-03 DIAGNOSIS — D801 Nonfamilial hypogammaglobulinemia: Secondary | ICD-10-CM | POA: Insufficient documentation

## 2020-06-03 DIAGNOSIS — J329 Chronic sinusitis, unspecified: Secondary | ICD-10-CM | POA: Diagnosis not present

## 2020-06-03 DIAGNOSIS — Z566 Other physical and mental strain related to work: Secondary | ICD-10-CM | POA: Insufficient documentation

## 2020-06-03 DIAGNOSIS — I10 Essential (primary) hypertension: Secondary | ICD-10-CM | POA: Insufficient documentation

## 2020-06-03 DIAGNOSIS — D510 Vitamin B12 deficiency anemia due to intrinsic factor deficiency: Secondary | ICD-10-CM | POA: Insufficient documentation

## 2020-06-03 DIAGNOSIS — C880 Waldenstrom macroglobulinemia: Secondary | ICD-10-CM

## 2020-06-03 DIAGNOSIS — J0101 Acute recurrent maxillary sinusitis: Secondary | ICD-10-CM

## 2020-06-03 LAB — CBC WITH DIFFERENTIAL (CANCER CENTER ONLY)
Abs Immature Granulocytes: 0.1 10*3/uL — ABNORMAL HIGH (ref 0.00–0.07)
Basophils Absolute: 0 10*3/uL (ref 0.0–0.1)
Basophils Relative: 0 %
Eosinophils Absolute: 0.1 10*3/uL (ref 0.0–0.5)
Eosinophils Relative: 1 %
HCT: 41.7 % (ref 36.0–46.0)
Hemoglobin: 13.8 g/dL (ref 12.0–15.0)
Immature Granulocytes: 1 %
Lymphocytes Relative: 28 %
Lymphs Abs: 2.3 10*3/uL (ref 0.7–4.0)
MCH: 34.4 pg — ABNORMAL HIGH (ref 26.0–34.0)
MCHC: 33.1 g/dL (ref 30.0–36.0)
MCV: 104 fL — ABNORMAL HIGH (ref 80.0–100.0)
Monocytes Absolute: 0.7 10*3/uL (ref 0.1–1.0)
Monocytes Relative: 9 %
Neutro Abs: 5 10*3/uL (ref 1.7–7.7)
Neutrophils Relative %: 61 %
Platelet Count: 112 10*3/uL — ABNORMAL LOW (ref 150–400)
RBC: 4.01 MIL/uL (ref 3.87–5.11)
RDW: 12.5 % (ref 11.5–15.5)
WBC Count: 8.1 10*3/uL (ref 4.0–10.5)
nRBC: 0 % (ref 0.0–0.2)

## 2020-06-03 LAB — URINALYSIS, COMPLETE (UACMP) WITH MICROSCOPIC
Bilirubin Urine: NEGATIVE
Glucose, UA: NEGATIVE mg/dL
Ketones, ur: NEGATIVE mg/dL
Nitrite: NEGATIVE
Protein, ur: NEGATIVE mg/dL
Specific Gravity, Urine: <1.005 — ABNORMAL LOW (ref 1.005–1.030)
pH: 6 (ref 5.0–8.0)

## 2020-06-03 LAB — CMP (CANCER CENTER ONLY)
ALT: 12 U/L (ref 0–44)
AST: 18 U/L (ref 15–41)
Albumin: 4.5 g/dL (ref 3.5–5.0)
Alkaline Phosphatase: 53 U/L (ref 38–126)
Anion gap: 7 (ref 5–15)
BUN: 11 mg/dL (ref 8–23)
CO2: 29 mmol/L (ref 22–32)
Calcium: 9.1 mg/dL (ref 8.9–10.3)
Chloride: 105 mmol/L (ref 98–111)
Creatinine: 0.77 mg/dL (ref 0.44–1.00)
GFR, Estimated: >60 mL/min (ref >60)
Glucose, Bld: 99 mg/dL (ref 70–99)
Potassium: 4.3 mmol/L (ref 3.5–5.1)
Sodium: 141 mmol/L (ref 135–145)
Total Bilirubin: 1.1 mg/dL (ref 0.3–1.2)
Total Protein: 6 g/dL — ABNORMAL LOW (ref 6.5–8.1)

## 2020-06-03 LAB — LACTATE DEHYDROGENASE: LDH: 183 U/L (ref 98–192)

## 2020-06-03 MED ORDER — CYANOCOBALAMIN 1000 MCG/ML IJ SOLN
INTRAMUSCULAR | Status: AC
  Filled 2020-06-03: qty 1

## 2020-06-03 MED ORDER — DIPHENHYDRAMINE HCL 25 MG PO CAPS
ORAL_CAPSULE | ORAL | Status: AC
  Filled 2020-06-03: qty 1

## 2020-06-03 MED ORDER — DEXTROSE 5 % IV SOLN
INTRAVENOUS | Status: DC
  Filled 2020-06-03: qty 250

## 2020-06-03 MED ORDER — DIPHENHYDRAMINE HCL 25 MG PO CAPS
25.0000 mg | ORAL_CAPSULE | Freq: Once | ORAL | Status: AC
  Administered 2020-06-03: 25 mg via ORAL

## 2020-06-03 MED ORDER — ACETAMINOPHEN 325 MG PO TABS
650.0000 mg | ORAL_TABLET | Freq: Once | ORAL | Status: AC
  Administered 2020-06-03: 650 mg via ORAL

## 2020-06-03 MED ORDER — CYANOCOBALAMIN 1000 MCG/ML IJ SOLN
1000.0000 ug | Freq: Once | INTRAMUSCULAR | Status: AC
  Administered 2020-06-03: 1000 ug via INTRAMUSCULAR

## 2020-06-03 MED ORDER — ACETAMINOPHEN 325 MG PO TABS
ORAL_TABLET | ORAL | Status: AC
  Filled 2020-06-03: qty 2

## 2020-06-03 MED ORDER — IMMUNE GLOBULIN (HUMAN) 20 GM/200ML IV SOLN
40.0000 g | Freq: Once | INTRAVENOUS | Status: AC
  Administered 2020-06-03: 40 g via INTRAVENOUS
  Filled 2020-06-03: qty 400

## 2020-06-03 NOTE — Progress Notes (Signed)
Pt declined to stay for post infusion observation period. Pt stated she has tolerated medication multiple times prior without difficulty. Pt aware to call clinic with any questions or concerns. Pt verbalized understanding and had no further questions.  ? ?

## 2020-06-03 NOTE — Progress Notes (Signed)
Hematology and Oncology Follow Up Visit  Kristy [name] [id number] 09-May-1955 65 y.o. 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 She is under a great deal of stress at work and is teary during our visit. There have been a lot of recent changes and this has been difficult for her. She plans to discuss with her PCP as this seems to now be causing an element of HTN which is new. She has episodes of blurry vision, chest tightness and headaches with episodes of anxiety.  She notes sinus congestion with wearing her mask.  No fever, chills, n/v, cough, rash, dizziness, SOB, chest pain, abdominal pain or changes in bowel or bladder habits.  She is now seeing urology and they have her an a new regimen with PRN Macrobid and estrace ointment which she feels has helped tremendously.  No swelling in her extremities.  She has intermittent spasms in her fingers with repetitive typing motion.  She notes occasional tingling in her feet that resolves with moving them around.  No falls or syncope to report.  She is eating well and doing her best to stay well hydrated. She avoids salt as a rule. Her weight is stable at 111 lbs.  In February, M-spike was 0.2 g/dL and IgM 107 mg.dL. IgG level was 585 mg/dL at that time.   ECOG Performance Status: 1 - Symptomatic but completely ambulatory  Medications:  Allergies as of 06/03/2020   No Known Allergies     Medication List       Accurate as of June 03, 2020  9:33 AM. If you have any questions, ask your nurse or doctor.        STOP taking these medications   amoxicillin-clavulanate 875-125 MG tablet Commonly known as: AUGMENTIN Stopped by: Laverna Peace, NP      TAKE these medications   cyanocobalamin 1000 MCG/ML injection Commonly known as: (VITAMIN B-12) Inject into the muscle.   cyanocobalamin 1000 MCG/ML injection Commonly known as: (VITAMIN B-12) INJECT 1 ML INTO THE MUSCLE EVERY 2 MONTHS   Imbruvica 420 MG Tabs Generic drug: ibrutinib TAKE 1 TABLET BY MOUTH DAILY AFTER BREAKFAST.   Lifitegrast 5 % Soln Apply 1 drop to eye 2 (two) times daily.   Cholecalciferol 25 MCG (1000 UT) tablet Take by mouth.   Vitamin D3 25 MCG tablet Commonly known as: Vitamin D Take by mouth daily.       Allergies: No Known Allergies  Past Medical History, Surgical history, Social history, and Family History were reviewed and updated.  Review of Systems: All other 10 point review of systems is negative.   Physical Exam:  vitals were not taken for this visit.   Wt Readings from Last 3 Encounters:  04/22/20 111 lb 12.8 oz (50.7 kg)  03/10/20 113 lb 1.9 oz (51.3 kg)  02/05/20 113 lb 6.4 oz (51.4 kg)    Ocular: Sclerae unicteric, pupils equal, round and reactive to light Ear-nose-throat: Oropharynx clear, dentition fair Lymphatic: No cervical or supraclavicular adenopathy Lungs no rales or rhonchi, good excursion bilaterally Heart regular rate and rhythm, no murmur appreciated Abd soft, nontender, positive bowel sounds MSK no focal spinal tenderness, no joint edema Neuro: non-focal, well-oriented, appropriate  affect Breasts: Deferred   Lab Results  Component Value Date   WBC 8.1 06/03/2020   HGB 13.8 06/03/2020   HCT 41.7 06/03/2020   MCV 104.0 (H) 06/03/2020   PLT 112 (L) 06/03/2020   No results found for: FERRITIN, IRON, TIBC, UIBC, IRONPCTSAT Lab Results  Component Value Date   RETICCTPCT 2.9 (H) 10/30/2014   RBC 4.01 06/03/2020   RETICCTABS 98.3 10/30/2014   Lab Results  Component Value Date   KPAFRELGTCHN 8.9 04/22/2020   LAMBDASER 2.5 (L) 04/22/2020   KAPLAMBRATIO 3.56 (H) 04/22/2020   Lab Results  Component Value  Date   IGGSERUM 815 04/22/2020   IGA 17 (L) 04/22/2020   IGMSERUM 158 04/22/2020   Lab Results  Component Value Date   TOTALPROTELP 6.1 04/22/2020   ALBUMINELP 3.8 04/22/2020   A1GS 0.2 04/22/2020   A2GS 0.5 04/22/2020   BETS 0.9 04/22/2020   BETA2SER 0.2 02/15/2015   GAMS 0.7 04/22/2020   MSPIKE 0.2 (H) 04/22/2020   SPEI Comment 03/10/2020     Chemistry      Component Value Date/Time   NA 138 04/22/2020 0814   NA 147 (H) 03/19/2017 0849   NA 139 03/16/2016 1337   K 4.4 04/22/2020 0814   K 4.5 03/19/2017 0849   K 3.8 03/16/2016 1337   CL 103 04/22/2020 0814   CL 108 03/19/2017 0849   CO2 28 04/22/2020 0814   CO2 31 03/19/2017 0849   CO2 27 03/16/2016 1337   BUN 13 04/22/2020 0814   BUN 11 03/19/2017 0849   BUN 5.2 (L) 03/16/2016 1337   CREATININE 0.81 04/22/2020 0814   CREATININE 1.0 03/19/2017 0849   CREATININE 0.7 03/16/2016 1337      Component Value Date/Time   CALCIUM 9.3 04/22/2020 0814   CALCIUM 9.6 03/19/2017 0849   CALCIUM 8.8 03/16/2016 1337   ALKPHOS 54 04/22/2020 0814   ALKPHOS 53 03/19/2017 0849   ALKPHOS 62 03/16/2016 1337   AST 19 04/22/2020 0814   AST 24 03/16/2016 1337   ALT 13 04/22/2020 0814   ALT 23 03/19/2017 0849   ALT 19 03/16/2016 1337   BILITOT 1.8 (H) 04/22/2020 0814   BILITOT 0.57 03/16/2016 1337       Impression and Plan: Kristy [name] is a very pleasant 65 yo caucasian female with Waldenstrom's with acquired hypogammaglobulinemia and recurrent sinusitis. She states that she will contact her PCP today regarding her new onset of HTN with anxiety and stress related to work. We will proceed with IVIG today as planned as well as B 12 injection.  Follow-up in 6 weeks.  She can contact our office with any questions or concerns.   Laverna Peace, NP 3/17/20229:33 AM

## 2020-06-04 LAB — IGG, IGA, IGM
IgA: 13 mg/dL — ABNORMAL LOW (ref 87–352)
IgG (Immunoglobin G), Serum: 628 mg/dL (ref 586–1602)
IgM (Immunoglobulin M), Srm: 111 mg/dL (ref 26–217)

## 2020-06-04 LAB — KAPPA/LAMBDA LIGHT CHAINS
Kappa free light chain: 8.9 mg/L (ref 3.3–19.4)
Kappa, lambda light chain ratio: 3.71 — ABNORMAL HIGH (ref 0.26–1.65)
Lambda free light chains: 2.4 mg/L — ABNORMAL LOW (ref 5.7–26.3)

## 2020-06-07 LAB — PROTEIN ELECTROPHORESIS, SERUM, WITH REFLEX
A/G Ratio: 1.7 (ref 0.7–1.7)
Albumin ELP: 4 g/dL (ref 2.9–4.4)
Alpha-1-Globulin: 0.2 g/dL (ref 0.0–0.4)
Alpha-2-Globulin: 0.6 g/dL (ref 0.4–1.0)
Beta Globulin: 0.9 g/dL (ref 0.7–1.3)
Gamma Globulin: 0.6 g/dL (ref 0.4–1.8)
Globulin, Total: 2.4 g/dL (ref 2.2–3.9)
Total Protein ELP: 6.4 g/dL (ref 6.0–8.5)

## 2020-06-15 ENCOUNTER — Other Ambulatory Visit (HOSPITAL_COMMUNITY): Payer: Self-pay

## 2020-06-20 LAB — COLOGUARD
COLOGUARD: NEGATIVE
COLOGUARD: NEGATIVE

## 2020-06-21 ENCOUNTER — Other Ambulatory Visit (HOSPITAL_COMMUNITY): Payer: Self-pay

## 2020-06-21 MED FILL — Ibrutinib Tab 420 MG: ORAL | 28 days supply | Qty: 28 | Fill #0 | Status: CN

## 2020-06-22 ENCOUNTER — Other Ambulatory Visit (HOSPITAL_COMMUNITY): Payer: Self-pay

## 2020-06-29 ENCOUNTER — Other Ambulatory Visit (HOSPITAL_COMMUNITY): Payer: Self-pay

## 2020-06-29 MED FILL — Ibrutinib Tab 420 MG: ORAL | 28 days supply | Qty: 28 | Fill #0 | Status: AC

## 2020-07-05 ENCOUNTER — Other Ambulatory Visit (HOSPITAL_COMMUNITY): Payer: Self-pay

## 2020-07-15 ENCOUNTER — Encounter: Payer: Self-pay | Admitting: Hematology & Oncology

## 2020-07-15 ENCOUNTER — Other Ambulatory Visit: Payer: Self-pay

## 2020-07-15 ENCOUNTER — Inpatient Hospital Stay: Payer: Federal, State, Local not specified - PPO | Attending: Hematology & Oncology

## 2020-07-15 ENCOUNTER — Telehealth: Payer: Self-pay

## 2020-07-15 ENCOUNTER — Inpatient Hospital Stay (HOSPITAL_BASED_OUTPATIENT_CLINIC_OR_DEPARTMENT_OTHER): Payer: Federal, State, Local not specified - PPO | Admitting: Hematology & Oncology

## 2020-07-15 ENCOUNTER — Inpatient Hospital Stay: Payer: Federal, State, Local not specified - PPO

## 2020-07-15 VITALS — BP 154/71 | HR 84 | Temp 97.6°F | Resp 20 | Wt 112.0 lb

## 2020-07-15 DIAGNOSIS — J329 Chronic sinusitis, unspecified: Secondary | ICD-10-CM | POA: Insufficient documentation

## 2020-07-15 DIAGNOSIS — C880 Waldenstrom macroglobulinemia: Secondary | ICD-10-CM

## 2020-07-15 DIAGNOSIS — D510 Vitamin B12 deficiency anemia due to intrinsic factor deficiency: Secondary | ICD-10-CM | POA: Diagnosis present

## 2020-07-15 DIAGNOSIS — D801 Nonfamilial hypogammaglobulinemia: Secondary | ICD-10-CM | POA: Insufficient documentation

## 2020-07-15 DIAGNOSIS — J0101 Acute recurrent maxillary sinusitis: Secondary | ICD-10-CM

## 2020-07-15 LAB — CMP (CANCER CENTER ONLY)
ALT: 18 U/L (ref 0–44)
AST: 21 U/L (ref 15–41)
Albumin: 4.5 g/dL (ref 3.5–5.0)
Alkaline Phosphatase: 64 U/L (ref 38–126)
Anion gap: 7 (ref 5–15)
BUN: 11 mg/dL (ref 8–23)
CO2: 29 mmol/L (ref 22–32)
Calcium: 9.2 mg/dL (ref 8.9–10.3)
Chloride: 101 mmol/L (ref 98–111)
Creatinine: 0.8 mg/dL (ref 0.44–1.00)
GFR, Estimated: >60 mL/min (ref >60)
Glucose, Bld: 97 mg/dL (ref 70–99)
Potassium: 4.1 mmol/L (ref 3.5–5.1)
Sodium: 137 mmol/L (ref 135–145)
Total Bilirubin: 1.2 mg/dL (ref 0.3–1.2)
Total Protein: 6.5 g/dL (ref 6.5–8.1)

## 2020-07-15 LAB — CBC WITH DIFFERENTIAL (CANCER CENTER ONLY)
Abs Immature Granulocytes: 0.07 10*3/uL (ref 0.00–0.07)
Basophils Absolute: 0 10*3/uL (ref 0.0–0.1)
Basophils Relative: 0 %
Eosinophils Absolute: 0.1 10*3/uL (ref 0.0–0.5)
Eosinophils Relative: 1 %
HCT: 41.5 % (ref 36.0–46.0)
Hemoglobin: 14 g/dL (ref 12.0–15.0)
Immature Granulocytes: 1 %
Lymphocytes Relative: 29 %
Lymphs Abs: 2.2 10*3/uL (ref 0.7–4.0)
MCH: 34.7 pg — ABNORMAL HIGH (ref 26.0–34.0)
MCHC: 33.7 g/dL (ref 30.0–36.0)
MCV: 103 fL — ABNORMAL HIGH (ref 80.0–100.0)
Monocytes Absolute: 0.6 10*3/uL (ref 0.1–1.0)
Monocytes Relative: 9 %
Neutro Abs: 4.5 10*3/uL (ref 1.7–7.7)
Neutrophils Relative %: 60 %
Platelet Count: 119 10*3/uL — ABNORMAL LOW (ref 150–400)
RBC: 4.03 MIL/uL (ref 3.87–5.11)
RDW: 12.2 % (ref 11.5–15.5)
WBC Count: 7.6 10*3/uL (ref 4.0–10.5)
nRBC: 0 % (ref 0.0–0.2)

## 2020-07-15 LAB — LACTATE DEHYDROGENASE: LDH: 207 U/L — ABNORMAL HIGH (ref 98–192)

## 2020-07-15 MED ORDER — ACETAMINOPHEN 325 MG PO TABS
ORAL_TABLET | ORAL | Status: AC
  Filled 2020-07-15: qty 2

## 2020-07-15 MED ORDER — DEXTROSE 5 % IV SOLN
INTRAVENOUS | Status: DC
  Filled 2020-07-15: qty 250

## 2020-07-15 MED ORDER — CYANOCOBALAMIN 1000 MCG/ML IJ SOLN
1000.0000 ug | Freq: Once | INTRAMUSCULAR | Status: AC
  Administered 2020-07-15: 1000 ug via INTRAMUSCULAR

## 2020-07-15 MED ORDER — DIPHENHYDRAMINE HCL 25 MG PO CAPS
25.0000 mg | ORAL_CAPSULE | Freq: Once | ORAL | Status: AC
  Administered 2020-07-15: 25 mg via ORAL

## 2020-07-15 MED ORDER — DIPHENHYDRAMINE HCL 25 MG PO CAPS
ORAL_CAPSULE | ORAL | Status: AC
  Filled 2020-07-15: qty 1

## 2020-07-15 MED ORDER — IMMUNE GLOBULIN (HUMAN) 20 GM/200ML IV SOLN
40.0000 g | Freq: Once | INTRAVENOUS | Status: AC
  Administered 2020-07-15: 40 g via INTRAVENOUS
  Filled 2020-07-15: qty 400

## 2020-07-15 MED ORDER — CYANOCOBALAMIN 1000 MCG/ML IJ SOLN
INTRAMUSCULAR | Status: AC
  Filled 2020-07-15: qty 1

## 2020-07-15 MED ORDER — ACETAMINOPHEN 325 MG PO TABS
650.0000 mg | ORAL_TABLET | Freq: Once | ORAL | Status: AC
  Administered 2020-07-15: 650 mg via ORAL

## 2020-07-15 NOTE — Progress Notes (Signed)
Hematology and Oncology Follow Up Visit  DESTINI CAMBRE [id number] [birth year] 65 y.o. 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 Hamza is here today for follow-up and treatment.  The big news is that she is going to retire.  I am so happy for her.  I know she is bored quite a long time.  She would like to spend more time with her family.  She has a granddaughter that she wants to be able to be with and help.  Her mom lives up in [place].  She wants to be able to go up there to visit.  Her son is graduating from Designer, jewellery program at [employer].  She is going up there for the graduation in May.  Given that I went to medical school at [employer] I think it is wonderful that she is going go up there.  I know that he will have a wonderful job and be well prepared for the working world.  As far as her Waldenstrom's is concerned, she is doing fantastic.  There is no monoclonal spike in her blood back in March.  The IgM level was 111 mg/dL.  The palm does bother her sinuses a little bit.  However, she seems to be managing.  She is worried about her blood pressure.  This is been on the high side.  She has a new family doctor that she likes a lot.  I am sure that he will help with management.  She has had no change in bowel or bladder habits.  She is trying to cook a little bit better.  Hopefully when she retires she will be able to cook which she would like that is more healthy.  Overall, her performance status is ECOG 0.   Medications:  Allergies as of 07/15/2020   No Known Allergies     Medication List       Accurate as of July 15, 2020  9:37 AM. If you have any questions, ask your nurse or doctor.        Cholecalciferol 25  MCG (1000 UT) tablet Take by mouth daily.   cyanocobalamin 1000 MCG/ML injection Commonly known as: (VITAMIN B-12) INJECT 1 ML INTO THE MUSCLE EVERY 2 MONTHS   estradiol 0.1 MG/GM vaginal cream Commonly known as: ESTRACE Place 1 Applicatorful vaginally 3 (three) times a week.   Imbruvica 420 MG Tabs Generic drug: ibrutinib TAKE 1 TABLET BY MOUTH DAILY AFTER BREAKFAST.   Lifitegrast 5 % Soln Apply 1 drop to eye 2 (two) times daily.       Allergies: No Known Allergies  Past Medical History, Surgical history, Social history, and Family History were reviewed and updated.  Review of Systems: Review of Systems  Constitutional: Negative.   HENT: Negative.   Eyes: Negative.   Respiratory: Negative.   Cardiovascular: Negative.   Gastrointestinal: Negative.   Genitourinary: Negative.   Musculoskeletal: Negative.   Skin: Negative.   Neurological: Negative.   Endo/Heme/Allergies: Negative.   Psychiatric/Behavioral: The patient is nervous/anxious.       Physical Exam:  weight is 112 lb (50.8 kg). Her oral temperature is 97.6 F (36.4 C). Her blood pressure is 154/71 (abnormal) and her pulse is 84. Her respiration is 20 and oxygen saturation is 98%.  Wt Readings from Last 3 Encounters:  07/15/20 112 lb (50.8 kg)  06/03/20 111 lb 3.2 oz (50.4 kg)  04/22/20 111 lb 12.8 oz (50.7 kg)    Physical Exam Vitals reviewed.  HENT:     Head: Normocephalic and atraumatic.  Eyes:     Pupils: Pupils are equal, round, and reactive to light.  Cardiovascular:     Rate and Rhythm: Normal rate and regular rhythm.     Heart sounds: Normal heart sounds.  Pulmonary:     Effort: Pulmonary effort is normal.     Breath sounds: Normal breath sounds.  Abdominal:     General: Bowel sounds are normal.     Palpations: Abdomen is soft.  Musculoskeletal:        General: No tenderness or deformity. Normal range of motion.     Cervical back: Normal range of motion.  Lymphadenopathy:      Cervical: No cervical adenopathy.  Skin:    General: Skin is warm and dry.     Findings: No erythema or rash.  Neurological:     Mental Status: She is alert and oriented to person, place, and time.  Psychiatric:        Behavior: Behavior normal.        Thought Content: Thought content normal.        Judgment: Judgment normal.      Lab Results  Component Value Date   WBC 7.6 07/15/2020   HGB 14.0 07/15/2020   HCT 41.5 07/15/2020   MCV 103.0 (H) 07/15/2020   PLT 119 (L) 07/15/2020   No results found for: FERRITIN, IRON, TIBC, UIBC, IRONPCTSAT Lab Results  Component Value Date   RETICCTPCT 2.9 (H) 10/30/2014   RBC 4.03 07/15/2020   RETICCTABS 98.3 10/30/2014   Lab Results  Component Value Date   KPAFRELGTCHN 8.9 06/03/2020   LAMBDASER 2.4 (L) 06/03/2020   KAPLAMBRATIO 3.71 (H) 06/03/2020   Lab Results  Component Value Date   IGGSERUM 628 06/03/2020   IGA 13 (L) 06/03/2020   IGMSERUM 111 06/03/2020   Lab Results  Component Value Date   TOTALPROTELP 6.4 06/03/2020   ALBUMINELP 4.0 06/03/2020   A1GS 0.2 06/03/2020   A2GS 0.6 06/03/2020   BETS 0.9 06/03/2020   BETA2SER 0.2 02/15/2015   GAMS 0.6 06/03/2020   MSPIKE Not Observed 06/03/2020   SPEI Comment 03/10/2020     Chemistry      Component Value Date/Time   NA 141 06/03/2020 0906   NA 147 (H) 03/19/2017 0849   NA 139 03/16/2016 1337   K 4.3 06/03/2020 0906   K 4.5 03/19/2017 0849   K 3.8 03/16/2016 1337   CL 105 06/03/2020 0906   CL 108 03/19/2017 0849   CO2 29 06/03/2020 0906   CO2 31 03/19/2017 0849   CO2 27 03/16/2016 1337   BUN 11 06/03/2020 0906   BUN 11 03/19/2017 0849   BUN 5.2 (L) 03/16/2016 1337   CREATININE 0.77 06/03/2020 0906   CREATININE 1.0 03/19/2017 0849   CREATININE 0.7 03/16/2016 1337      Component Value Date/Time   CALCIUM 9.1 06/03/2020 0906   CALCIUM 9.6 03/19/2017 0849   CALCIUM 8.8 03/16/2016 1337   ALKPHOS 53 06/03/2020 0906   ALKPHOS 53 03/19/2017 0849   ALKPHOS 62  03/16/2016 1337   AST 18 06/03/2020 0906   AST 24 03/16/2016 1337   ALT 12 06/03/2020 0906   ALT 23 03/19/2017 0849   ALT 19 03/16/2016 1337  BILITOT 1.1 06/03/2020 0906   BILITOT 0.57 03/16/2016 1337       Impression and Plan: Ms. Langseth is a very pleasant 65 yo caucasian female with Waldenstrom's with acquired hypogammaglobulinemia and recurrent sinusitis.  [name] is doing quite well for her.  I am so happy that the Waldenstrom's is responding well.   We will proceed with IVIG today as planned.   She does get vitamin B12.  We will have to check the level to see how it looks.  As always, we we will have her come back in another 6 weeks or so.  [name], MD 4/28/20229:37 AM

## 2020-07-15 NOTE — Patient Instructions (Signed)
Immune Globulin Injection What is this medicine? IMMUNE GLOBULIN (im MUNE GLOB yoo lin) helps to prevent or reduce the severity of certain infections in patients who are at risk. This medicine is collected from the pooled blood of many donors. It is used to treat immune system problems, thrombocytopenia, and Kawasaki syndrome. This medicine may be used for other purposes; ask your health care provider or pharmacist if you have questions. COMMON BRAND NAME(S): ASCENIV, Baygam, BIVIGAM, Carimune, Carimune NF, cutaquig, Cuvitru, Flebogamma, Flebogamma DIF, GamaSTAN, GamaSTAN S/D, Gamimune N, Gammagard, Gammagard S/D, Gammaked, Gammaplex, Gammar-P IV, Gamunex, Gamunex-C, Hizentra, Iveegam, Iveegam EN, Octagam, Panglobulin, Panglobulin NF, panzyga, Polygam S/D, Privigen, Sandoglobulin, Venoglobulin-S, Vigam, Vivaglobulin, Xembify What should I tell my health care provider before I take this medicine? They need to know if you have any of these conditions:  diabetes  extremely low or no immune antibodies in the blood  heart disease  history of blood clots  hyperprolinemia  infection in the blood, sepsis  kidney disease  recently received or scheduled to receive a vaccination  an unusual or allergic reaction to human immune globulin, albumin, maltose, sucrose, other medicines, foods, dyes, or preservatives  pregnant or trying to get pregnant  breast-feeding How should I use this medicine? This medicine is for injection into a muscle or infusion into a vein or skin. It is usually given by a health care professional in a hospital or clinic setting. In rare cases, some brands of this medicine might be given at home. You will be taught how to give this medicine. Use exactly as directed. Take your medicine at regular intervals. Do not take your medicine more often than directed. Talk to your pediatrician regarding the use of this medicine in children. While this drug may be prescribed for selected  conditions, precautions do apply. Overdosage: If you think you have taken too much of this medicine contact a poison control center or emergency room at once. NOTE: This medicine is only for you. Do not share this medicine with others. What if I miss a dose? It is important not to miss your dose. Call your doctor or health care professional if you are unable to keep an appointment. If you give yourself the medicine and you miss a dose, take it as soon as you can. If it is almost time for your next dose, take only that dose. Do not take double or extra doses. What may interact with this medicine?  aspirin and aspirin-like medicines  cisplatin  cyclosporine  medicines for infection like acyclovir, adefovir, amphotericin B, bacitracin, cidofovir, foscarnet, ganciclovir, gentamicin, pentamidine, vancomycin  NSAIDS, medicines for pain and inflammation, like ibuprofen or naproxen  pamidronate  vaccines  zoledronic acid This list may not describe all possible interactions. Give your health care provider a list of all the medicines, herbs, non-prescription drugs, or dietary supplements you use. Also tell them if you smoke, drink alcohol, or use illegal drugs. Some items may interact with your medicine. What should I watch for while using this medicine? Your condition will be monitored carefully while you are receiving this medicine. This medicine is made from pooled blood donations of many different people. It may be possible to pass an infection in this medicine. However, the donors are screened for infections and all products are tested for HIV and hepatitis. The medicine is treated to kill most or all bacteria and viruses. Talk to your doctor about the risks and benefits of this medicine. Do not have vaccinations for at least   14 days before, or until at least 3 months after receiving this medicine. What side effects may I notice from receiving this medicine? Side effects that you should report  to your doctor or health care professional as soon as possible:  allergic reactions like skin rash, itching or hives, swelling of the face, lips, or tongue  blue colored lips or skin  breathing problems  chest pain or tightness  fever  signs and symptoms of aseptic meningitis such as stiff neck; sensitivity to light; headache; drowsiness; fever; nausea; vomiting; rash  signs and symptoms of a blood clot such as chest pain; shortness of breath; pain, swelling, or warmth in the leg  signs and symptoms of hemolytic anemia such as fast heartbeat; tiredness; dark yellow or brown urine; or yellowing of the eyes or skin  signs and symptoms of kidney injury like trouble passing urine or change in the amount of urine  sudden weight gain  swelling of the ankles, feet, hands Side effects that usually do not require medical attention (report to your doctor or health care professional if they continue or are bothersome):  diarrhea  flushing  headache  increased sweating  joint pain  muscle cramps  muscle pain  nausea  pain, redness, or irritation at site where injected  tiredness This list may not describe all possible side effects. Call your doctor for medical advice about side effects. You may report side effects to FDA at 1-800-FDA-1088. Where should I keep my medicine? Keep out of the reach of children. This drug is usually given in a hospital or clinic and will not be stored at home. In rare cases, some brands of this medicine may be given at home. If you are using this medicine at home, you will be instructed on how to store this medicine. Throw away any unused medicine after the expiration date on the label. NOTE: This sheet is a summary. It may not cover all possible information. If you have questions about this medicine, talk to your doctor, pharmacist, or health care provider.  2021 Elsevier/Gold Standard (2018-10-09 12:51:14)  

## 2020-07-15 NOTE — Telephone Encounter (Signed)
appts made per 07/15/20 los and pt will gain sch in tx/avs and through my chart   Kristy [name] 

## 2020-07-16 LAB — IGG, IGA, IGM
IgA: 14 mg/dL — ABNORMAL LOW (ref 87–352)
IgG (Immunoglobin G), Serum: 671 mg/dL (ref 586–1602)
IgM (Immunoglobulin M), Srm: 117 mg/dL (ref 26–217)

## 2020-07-16 LAB — KAPPA/LAMBDA LIGHT CHAINS
Kappa free light chain: 10.3 mg/L (ref 3.3–19.4)
Kappa, lambda light chain ratio: 3.43 — ABNORMAL HIGH (ref 0.26–1.65)
Lambda free light chains: 3 mg/L — ABNORMAL LOW (ref 5.7–26.3)

## 2020-07-17 LAB — PROTEIN ELECTROPHORESIS, SERUM
A/G Ratio: 1.8 — ABNORMAL HIGH (ref 0.7–1.7)
Albumin ELP: 4.2 g/dL (ref 2.9–4.4)
Alpha-1-Globulin: 0.2 g/dL (ref 0.0–0.4)
Alpha-2-Globulin: 0.6 g/dL (ref 0.4–1.0)
Beta Globulin: 1 g/dL (ref 0.7–1.3)
Gamma Globulin: 0.7 g/dL (ref 0.4–1.8)
Globulin, Total: 2.4 g/dL (ref 2.2–3.9)
Total Protein ELP: 6.6 g/dL (ref 6.0–8.5)

## 2020-07-29 ENCOUNTER — Other Ambulatory Visit (HOSPITAL_COMMUNITY): Payer: Self-pay

## 2020-08-02 ENCOUNTER — Other Ambulatory Visit (HOSPITAL_COMMUNITY): Payer: Self-pay

## 2020-08-02 MED FILL — Ibrutinib Tab 420 MG: ORAL | 28 days supply | Qty: 28 | Fill #1 | Status: AC

## 2020-08-05 ENCOUNTER — Other Ambulatory Visit (HOSPITAL_COMMUNITY): Payer: Self-pay

## 2020-08-18 ENCOUNTER — Encounter: Payer: Self-pay | Admitting: Family

## 2020-08-18 ENCOUNTER — Other Ambulatory Visit: Payer: Self-pay

## 2020-08-18 ENCOUNTER — Telehealth: Payer: Self-pay

## 2020-08-18 ENCOUNTER — Inpatient Hospital Stay: Payer: Federal, State, Local not specified - PPO

## 2020-08-18 ENCOUNTER — Inpatient Hospital Stay (HOSPITAL_BASED_OUTPATIENT_CLINIC_OR_DEPARTMENT_OTHER): Payer: Federal, State, Local not specified - PPO | Admitting: Family

## 2020-08-18 ENCOUNTER — Inpatient Hospital Stay: Payer: Federal, State, Local not specified - PPO | Attending: Hematology & Oncology

## 2020-08-18 VITALS — BP 124/62 | HR 76 | Resp 17

## 2020-08-18 VITALS — BP 151/84 | HR 77 | Temp 97.7°F | Resp 18 | Ht 60.0 in | Wt 112.0 lb

## 2020-08-18 DIAGNOSIS — J329 Chronic sinusitis, unspecified: Secondary | ICD-10-CM | POA: Insufficient documentation

## 2020-08-18 DIAGNOSIS — J0101 Acute recurrent maxillary sinusitis: Secondary | ICD-10-CM

## 2020-08-18 DIAGNOSIS — D510 Vitamin B12 deficiency anemia due to intrinsic factor deficiency: Secondary | ICD-10-CM | POA: Diagnosis present

## 2020-08-18 DIAGNOSIS — C880 Waldenstrom macroglobulinemia: Secondary | ICD-10-CM | POA: Insufficient documentation

## 2020-08-18 DIAGNOSIS — D801 Nonfamilial hypogammaglobulinemia: Secondary | ICD-10-CM | POA: Insufficient documentation

## 2020-08-18 LAB — CBC WITH DIFFERENTIAL (CANCER CENTER ONLY)
Abs Immature Granulocytes: 0.1 K/uL — ABNORMAL HIGH (ref 0.00–0.07)
Basophils Absolute: 0 K/uL (ref 0.0–0.1)
Basophils Relative: 0 %
Eosinophils Absolute: 0.1 K/uL (ref 0.0–0.5)
Eosinophils Relative: 1 %
HCT: 40.3 % (ref 36.0–46.0)
Hemoglobin: 13.6 g/dL (ref 12.0–15.0)
Immature Granulocytes: 1 %
Lymphocytes Relative: 26 %
Lymphs Abs: 1.8 K/uL (ref 0.7–4.0)
MCH: 35 pg — ABNORMAL HIGH (ref 26.0–34.0)
MCHC: 33.7 g/dL (ref 30.0–36.0)
MCV: 103.6 fL — ABNORMAL HIGH (ref 80.0–100.0)
Monocytes Absolute: 0.4 K/uL (ref 0.1–1.0)
Monocytes Relative: 6 %
Neutro Abs: 4.7 K/uL (ref 1.7–7.7)
Neutrophils Relative %: 66 %
Platelet Count: 115 K/uL — ABNORMAL LOW (ref 150–400)
RBC: 3.89 MIL/uL (ref 3.87–5.11)
RDW: 12.4 % (ref 11.5–15.5)
WBC Count: 7.2 K/uL (ref 4.0–10.5)
nRBC: 0 % (ref 0.0–0.2)

## 2020-08-18 LAB — CMP (CANCER CENTER ONLY)
ALT: 20 U/L (ref 0–44)
AST: 26 U/L (ref 15–41)
Albumin: 4.3 g/dL (ref 3.5–5.0)
Alkaline Phosphatase: 58 U/L (ref 38–126)
Anion gap: 7 (ref 5–15)
BUN: 9 mg/dL (ref 8–23)
CO2: 30 mmol/L (ref 22–32)
Calcium: 9 mg/dL (ref 8.9–10.3)
Chloride: 104 mmol/L (ref 98–111)
Creatinine: 0.79 mg/dL (ref 0.44–1.00)
GFR, Estimated: >60 mL/min
Glucose, Bld: 87 mg/dL (ref 70–99)
Potassium: 3.9 mmol/L (ref 3.5–5.1)
Sodium: 141 mmol/L (ref 135–145)
Total Bilirubin: 1.2 mg/dL (ref 0.3–1.2)
Total Protein: 6.2 g/dL — ABNORMAL LOW (ref 6.5–8.1)

## 2020-08-18 LAB — VITAMIN B12: Vitamin B-12: 273 pg/mL (ref 180–914)

## 2020-08-18 MED ORDER — IMMUNE GLOBULIN (HUMAN) 20 GM/200ML IV SOLN
40.0000 g | Freq: Once | INTRAVENOUS | Status: AC
Start: 2020-08-18 — End: 2020-08-18
  Administered 2020-08-18: 40 g via INTRAVENOUS
  Filled 2020-08-18: qty 400

## 2020-08-18 MED ORDER — ACETAMINOPHEN 325 MG PO TABS
650.0000 mg | ORAL_TABLET | Freq: Once | ORAL | Status: AC
  Administered 2020-08-18: 650 mg via ORAL

## 2020-08-18 MED ORDER — DIPHENHYDRAMINE HCL 25 MG PO CAPS
ORAL_CAPSULE | ORAL | Status: AC
  Filled 2020-08-18: qty 1

## 2020-08-18 MED ORDER — DIPHENHYDRAMINE HCL 25 MG PO CAPS
25.0000 mg | ORAL_CAPSULE | Freq: Once | ORAL | Status: AC
  Administered 2020-08-18: 25 mg via ORAL

## 2020-08-18 MED ORDER — CYANOCOBALAMIN 1000 MCG/ML IJ SOLN
1000.0000 ug | Freq: Once | INTRAMUSCULAR | Status: AC
  Administered 2020-08-18: 1000 ug via INTRAMUSCULAR

## 2020-08-18 MED ORDER — CYANOCOBALAMIN 1000 MCG/ML IJ SOLN
INTRAMUSCULAR | Status: AC
  Filled 2020-08-18: qty 1

## 2020-08-18 MED ORDER — ACETAMINOPHEN 325 MG PO TABS
ORAL_TABLET | ORAL | Status: AC
  Filled 2020-08-18: qty 2

## 2020-08-18 MED ORDER — DEXTROSE 5 % IV SOLN
INTRAVENOUS | Status: DC
  Filled 2020-08-18 (×2): qty 250

## 2020-08-18 NOTE — Telephone Encounter (Signed)
appts made and printed for pt per los, pt states they should be 5 weeks   Kristy [name]

## 2020-08-18 NOTE — Progress Notes (Signed)
Pt declined to stay for post infusion observation period. Pt stated she has tolerated medication multiple times prior without difficulty. Pt aware to call clinic with any questions or concerns. Pt verbalized understanding and had no further questions.  ? ?

## 2020-08-18 NOTE — Progress Notes (Signed)
Hematology and Oncology Follow Up Visit  Kristy [name] [id number] [birth year] 65 y.o. 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 Her last day work was yesterday and she is now officially retired! This is wonderful news and she is so very happy. She has lots of exciting adventures planned for the summer.  In April, no M-spike was detected and IgG level was stable at 671 mg/dL.  No fever, chills, n/v, cough, rash, dizziness, SOB, chest pain, palpitations, abdominal pain or changes in bowel or bladder habits.  She had been having tightness in her chest which she associated with anxiety at work. She is asymptomatic at this time.  She has occasional mild swelling in her ankles. This comes and goes and improves with propping up her feet.  She has occasional cramping in her hands with Imbruvica.  No numbness or tingling in her extremities.  No calls or syncope to report. She has maintained a good appetite and is staying well hydrated. Her weight is stable at 112 lbs.    ECOG Performance Status: 1 - Symptomatic but completely ambulatory  Medications:  Allergies as of 08/18/2020   No Known Allergies     Medication List       Accurate as of August 18, 2020  8:38 AM. If you have any questions, ask your nurse or doctor.        Cholecalciferol 25 MCG (1000 UT) tablet Take by mouth daily.   cyanocobalamin 1000 MCG/ML injection Commonly known as: (VITAMIN B-12) INJECT 1 ML INTO THE MUSCLE EVERY 2 MONTHS   estradiol 0.1 MG/GM vaginal cream Commonly known as: ESTRACE Place 1 Applicatorful vaginally 3 (three) times a week.   Imbruvica 420 MG Tabs Generic drug: ibrutinib TAKE 1 TABLET BY MOUTH DAILY  AFTER BREAKFAST.   Lifitegrast 5 % Soln Apply 1 drop to eye 2 (two) times daily.       Allergies: No Known Allergies  Past Medical History, Surgical history, Social history, and Family History were reviewed and updated.  Review of Systems: All other 10 point review of systems is negative.   Physical Exam:  vitals were not taken for this visit.   Wt Readings from Last 3 Encounters:  07/15/20 112 lb (50.8 kg)  06/03/20 111 lb 3.2 oz (50.4 kg)  04/22/20 111 lb 12.8 oz (50.7 kg)    Ocular: Sclerae unicteric, pupils equal, round and reactive to light Ear-nose-throat: Oropharynx clear, dentition fair Lymphatic: No cervical or supraclavicular adenopathy Lungs no rales or rhonchi, good excursion bilaterally Heart regular rate and rhythm, no murmur appreciated Abd soft, nontender, positive bowel sounds MSK no focal spinal tenderness, no joint edema Neuro: non-focal, well-oriented, appropriate affect Breasts: Deferred   Lab Results  Component Value Date   WBC 7.2 08/18/2020   HGB 13.6 08/18/2020   HCT 40.3 08/18/2020   MCV 103.6 (H) 08/18/2020   PLT 115 (L) 08/18/2020   No results found for: FERRITIN, IRON, TIBC, UIBC, IRONPCTSAT Lab Results  Component Value Date   RETICCTPCT 2.9 (H) 10/30/2014   RBC 3.89 08/18/2020   RETICCTABS 98.3 10/30/2014   Lab Results  Component Value Date   KPAFRELGTCHN 10.3 07/15/2020   LAMBDASER 3.0 (L) 07/15/2020  KAPLAMBRATIO 3.43 (H) 07/15/2020   Lab Results  Component Value Date   IGGSERUM 671 07/15/2020   IGA 14 (L) 07/15/2020   IGMSERUM 117 07/15/2020   Lab Results  Component Value Date   TOTALPROTELP 6.6 07/15/2020   ALBUMINELP 4.2 07/15/2020   A1GS 0.2 07/15/2020   A2GS 0.6 07/15/2020   BETS 1.0 07/15/2020   BETA2SER 0.2 02/15/2015   GAMS 0.7 07/15/2020   MSPIKE Not Observed 07/15/2020   SPEI Comment 07/15/2020     Chemistry      Component Value Date/Time   NA 137 07/15/2020 0906   NA 147 (H) 03/19/2017 0849   NA  139 03/16/2016 1337   K 4.1 07/15/2020 0906   K 4.5 03/19/2017 0849   K 3.8 03/16/2016 1337   CL 101 07/15/2020 0906   CL 108 03/19/2017 0849   CO2 29 07/15/2020 0906   CO2 31 03/19/2017 0849   CO2 27 03/16/2016 1337   BUN 11 07/15/2020 0906   BUN 11 03/19/2017 0849   BUN 5.2 (L) 03/16/2016 1337   CREATININE 0.80 07/15/2020 0906   CREATININE 1.0 03/19/2017 0849   CREATININE 0.7 03/16/2016 1337      Component Value Date/Time   CALCIUM 9.2 07/15/2020 0906   CALCIUM 9.6 03/19/2017 0849   CALCIUM 8.8 03/16/2016 1337   ALKPHOS 64 07/15/2020 0906   ALKPHOS 53 03/19/2017 0849   ALKPHOS 62 03/16/2016 1337   AST 21 07/15/2020 0906   AST 24 03/16/2016 1337   ALT 18 07/15/2020 0906   ALT 23 03/19/2017 0849   ALT 19 03/16/2016 1337   BILITOT 1.2 07/15/2020 0906   BILITOT 0.57 03/16/2016 1337       Impression and Plan: Kristy [name] is a very pleasant 65 yo caucasian female with Waldenstrom's with acquired hypogammaglobulinemia and recurrent sinusitis. We will proceed with IVIG infusion and b 12 injection today as planned.  She will continue her same regimen with Imbruvica.  Protein studies drawn today and results are pending. So far these have remained stable.  Follow-up in 6 weeks.  She was encouraged to contact our office with any questions or concerns.   Laverna Peace, NP 6/1/20228:38 AM

## 2020-08-18 NOTE — Patient Instructions (Signed)
Immune Globulin Injection What is this medicine? IMMUNE GLOBULIN (im MUNE GLOB yoo lin) helps to prevent or reduce the severity of certain infections in patients who are at risk. This medicine is collected from the pooled blood of many donors. It is used to treat immune system problems, thrombocytopenia, and Kawasaki syndrome. This medicine may be used for other purposes; ask your health care provider or pharmacist if you have questions. COMMON BRAND NAME(S): ASCENIV, Baygam, BIVIGAM, Carimune, Carimune NF, cutaquig, Cuvitru, Flebogamma, Flebogamma DIF, GamaSTAN, GamaSTAN S/D, Gamimune N, Gammagard, Gammagard S/D, Gammaked, Gammaplex, Gammar-P IV, Gamunex, Gamunex-C, Hizentra, Iveegam, Iveegam EN, Octagam, Panglobulin, Panglobulin NF, panzyga, Polygam S/D, Privigen, Sandoglobulin, Venoglobulin-S, Vigam, Vivaglobulin, Xembify What should I tell my health care provider before I take this medicine? They need to know if you have any of these conditions:  diabetes  extremely low or no immune antibodies in the blood  heart disease  history of blood clots  hyperprolinemia  infection in the blood, sepsis  kidney disease  recently received or scheduled to receive a vaccination  an unusual or allergic reaction to human immune globulin, albumin, maltose, sucrose, other medicines, foods, dyes, or preservatives  pregnant or trying to get pregnant  breast-feeding How should I use this medicine? This medicine is for injection into a muscle or infusion into a vein or skin. It is usually given by a health care professional in a hospital or clinic setting. In rare cases, some brands of this medicine might be given at home. You will be taught how to give this medicine. Use exactly as directed. Take your medicine at regular intervals. Do not take your medicine more often than directed. Talk to your pediatrician regarding the use of this medicine in children. While this drug may be prescribed for selected  conditions, precautions do apply. Overdosage: If you think you have taken too much of this medicine contact a poison control center or emergency room at once. NOTE: This medicine is only for you. Do not share this medicine with others. What if I miss a dose? It is important not to miss your dose. Call your doctor or health care professional if you are unable to keep an appointment. If you give yourself the medicine and you miss a dose, take it as soon as you can. If it is almost time for your next dose, take only that dose. Do not take double or extra doses. What may interact with this medicine?  aspirin and aspirin-like medicines  cisplatin  cyclosporine  medicines for infection like acyclovir, adefovir, amphotericin B, bacitracin, cidofovir, foscarnet, ganciclovir, gentamicin, pentamidine, vancomycin  NSAIDS, medicines for pain and inflammation, like ibuprofen or naproxen  pamidronate  vaccines  zoledronic acid This list may not describe all possible interactions. Give your health care provider a list of all the medicines, herbs, non-prescription drugs, or dietary supplements you use. Also tell them if you smoke, drink alcohol, or use illegal drugs. Some items may interact with your medicine. What should I watch for while using this medicine? Your condition will be monitored carefully while you are receiving this medicine. This medicine is made from pooled blood donations of many different people. It may be possible to pass an infection in this medicine. However, the donors are screened for infections and all products are tested for HIV and hepatitis. The medicine is treated to kill most or all bacteria and viruses. Talk to your doctor about the risks and benefits of this medicine. Do not have vaccinations for at least   14 days before, or until at least 3 months after receiving this medicine. What side effects may I notice from receiving this medicine? Side effects that you should report  to your doctor or health care professional as soon as possible:  allergic reactions like skin rash, itching or hives, swelling of the face, lips, or tongue  blue colored lips or skin  breathing problems  chest pain or tightness  fever  signs and symptoms of aseptic meningitis such as stiff neck; sensitivity to light; headache; drowsiness; fever; nausea; vomiting; rash  signs and symptoms of a blood clot such as chest pain; shortness of breath; pain, swelling, or warmth in the leg  signs and symptoms of hemolytic anemia such as fast heartbeat; tiredness; dark yellow or brown urine; or yellowing of the eyes or skin  signs and symptoms of kidney injury like trouble passing urine or change in the amount of urine  sudden weight gain  swelling of the ankles, feet, hands Side effects that usually do not require medical attention (report to your doctor or health care professional if they continue or are bothersome):  diarrhea  flushing  headache  increased sweating  joint pain  muscle cramps  muscle pain  nausea  pain, redness, or irritation at site where injected  tiredness This list may not describe all possible side effects. Call your doctor for medical advice about side effects. You may report side effects to FDA at 1-800-FDA-1088. Where should I keep my medicine? Keep out of the reach of children. This drug is usually given in a hospital or clinic and will not be stored at home. In rare cases, some brands of this medicine may be given at home. If you are using this medicine at home, you will be instructed on how to store this medicine. Throw away any unused medicine after the expiration date on the label. NOTE: This sheet is a summary. It may not cover all possible information. If you have questions about this medicine, talk to your doctor, pharmacist, or health care provider.  2021 Elsevier/Gold Standard (2018-10-09 12:51:14)  

## 2020-08-19 LAB — KAPPA/LAMBDA LIGHT CHAINS
Kappa free light chain: 9.7 mg/L (ref 3.3–19.4)
Kappa, lambda light chain ratio: 3.34 — ABNORMAL HIGH (ref 0.26–1.65)
Lambda free light chains: 2.9 mg/L — ABNORMAL LOW (ref 5.7–26.3)

## 2020-08-19 LAB — IGG, IGA, IGM
IgA: 12 mg/dL — ABNORMAL LOW (ref 87–352)
IgG (Immunoglobin G), Serum: 699 mg/dL (ref 586–1602)
IgM (Immunoglobulin M), Srm: 102 mg/dL (ref 26–217)

## 2020-08-23 LAB — IMMUNOFIXATION REFLEX, SERUM
IgA: 12 mg/dL — ABNORMAL LOW (ref 87–352)
IgG (Immunoglobin G), Serum: 665 mg/dL (ref 586–1602)
IgM (Immunoglobulin M), Srm: 107 mg/dL (ref 26–217)

## 2020-08-23 LAB — PROTEIN ELECTROPHORESIS, SERUM, WITH REFLEX
A/G Ratio: 1.7 (ref 0.7–1.7)
Albumin ELP: 3.8 g/dL (ref 2.9–4.4)
Alpha-1-Globulin: 0.2 g/dL (ref 0.0–0.4)
Alpha-2-Globulin: 0.5 g/dL (ref 0.4–1.0)
Beta Globulin: 0.8 g/dL (ref 0.7–1.3)
Gamma Globulin: 0.6 g/dL (ref 0.4–1.8)
Globulin, Total: 2.2 g/dL (ref 2.2–3.9)
M-Spike, %: 0.2 g/dL — ABNORMAL HIGH
SPEP Interpretation: 0
Total Protein ELP: 6 g/dL (ref 6.0–8.5)

## 2020-08-30 ENCOUNTER — Other Ambulatory Visit (HOSPITAL_COMMUNITY): Payer: Self-pay

## 2020-08-30 MED FILL — Ibrutinib Tab 420 MG: ORAL | 28 days supply | Qty: 28 | Fill #2 | Status: AC

## 2020-09-01 ENCOUNTER — Other Ambulatory Visit (HOSPITAL_COMMUNITY): Payer: Self-pay

## 2020-09-23 ENCOUNTER — Inpatient Hospital Stay: Payer: Federal, State, Local not specified - PPO | Attending: Hematology & Oncology

## 2020-09-23 ENCOUNTER — Inpatient Hospital Stay (HOSPITAL_BASED_OUTPATIENT_CLINIC_OR_DEPARTMENT_OTHER): Payer: Federal, State, Local not specified - PPO | Admitting: Hematology & Oncology

## 2020-09-23 ENCOUNTER — Other Ambulatory Visit: Payer: Self-pay

## 2020-09-23 ENCOUNTER — Inpatient Hospital Stay: Payer: Federal, State, Local not specified - PPO

## 2020-09-23 ENCOUNTER — Telehealth: Payer: Self-pay | Admitting: *Deleted

## 2020-09-23 ENCOUNTER — Encounter: Payer: Self-pay | Admitting: Hematology & Oncology

## 2020-09-23 VITALS — BP 128/57 | HR 85 | Temp 97.7°F | Resp 18

## 2020-09-23 VITALS — BP 150/72 | HR 86 | Temp 98.2°F | Resp 18 | Ht 60.0 in | Wt 111.8 lb

## 2020-09-23 DIAGNOSIS — D510 Vitamin B12 deficiency anemia due to intrinsic factor deficiency: Secondary | ICD-10-CM

## 2020-09-23 DIAGNOSIS — D801 Nonfamilial hypogammaglobulinemia: Secondary | ICD-10-CM | POA: Insufficient documentation

## 2020-09-23 DIAGNOSIS — C880 Waldenstrom macroglobulinemia: Secondary | ICD-10-CM

## 2020-09-23 DIAGNOSIS — J0101 Acute recurrent maxillary sinusitis: Secondary | ICD-10-CM

## 2020-09-23 DIAGNOSIS — J329 Chronic sinusitis, unspecified: Secondary | ICD-10-CM | POA: Diagnosis not present

## 2020-09-23 LAB — CBC WITH DIFFERENTIAL (CANCER CENTER ONLY)
Abs Immature Granulocytes: 0.14 10*3/uL — ABNORMAL HIGH (ref 0.00–0.07)
Basophils Absolute: 0 10*3/uL (ref 0.0–0.1)
Basophils Relative: 0 %
Eosinophils Absolute: 0.1 10*3/uL (ref 0.0–0.5)
Eosinophils Relative: 1 %
HCT: 40.5 % (ref 36.0–46.0)
Hemoglobin: 13.6 g/dL (ref 12.0–15.0)
Immature Granulocytes: 2 %
Lymphocytes Relative: 26 %
Lymphs Abs: 2.3 10*3/uL (ref 0.7–4.0)
MCH: 35.1 pg — ABNORMAL HIGH (ref 26.0–34.0)
MCHC: 33.6 g/dL (ref 30.0–36.0)
MCV: 104.4 fL — ABNORMAL HIGH (ref 80.0–100.0)
Monocytes Absolute: 0.6 10*3/uL (ref 0.1–1.0)
Monocytes Relative: 6 %
Neutro Abs: 5.9 10*3/uL (ref 1.7–7.7)
Neutrophils Relative %: 65 %
Platelet Count: 118 10*3/uL — ABNORMAL LOW (ref 150–400)
RBC: 3.88 MIL/uL (ref 3.87–5.11)
RDW: 12.3 % (ref 11.5–15.5)
WBC Count: 9 10*3/uL (ref 4.0–10.5)
nRBC: 0 % (ref 0.0–0.2)

## 2020-09-23 LAB — CMP (CANCER CENTER ONLY)
ALT: 18 U/L (ref 0–44)
AST: 24 U/L (ref 15–41)
Albumin: 4.4 g/dL (ref 3.5–5.0)
Alkaline Phosphatase: 52 U/L (ref 38–126)
Anion gap: 8 (ref 5–15)
BUN: 11 mg/dL (ref 8–23)
CO2: 29 mmol/L (ref 22–32)
Calcium: 9.3 mg/dL (ref 8.9–10.3)
Chloride: 103 mmol/L (ref 98–111)
Creatinine: 0.82 mg/dL (ref 0.44–1.00)
GFR, Estimated: >60 mL/min (ref >60)
Glucose, Bld: 84 mg/dL (ref 70–99)
Potassium: 4.1 mmol/L (ref 3.5–5.1)
Sodium: 140 mmol/L (ref 135–145)
Total Bilirubin: 1.2 mg/dL (ref 0.3–1.2)
Total Protein: 6.2 g/dL — ABNORMAL LOW (ref 6.5–8.1)

## 2020-09-23 LAB — VITAMIN B12: Vitamin B-12: 257 pg/mL (ref 180–914)

## 2020-09-23 MED ORDER — DEXTROSE 5 % IV SOLN
INTRAVENOUS | Status: DC
  Filled 2020-09-23 (×2): qty 250

## 2020-09-23 MED ORDER — DIPHENHYDRAMINE HCL 25 MG PO CAPS
ORAL_CAPSULE | ORAL | Status: AC
  Filled 2020-09-23: qty 1

## 2020-09-23 MED ORDER — ACETAMINOPHEN 325 MG PO TABS
ORAL_TABLET | ORAL | Status: AC
  Filled 2020-09-23: qty 2

## 2020-09-23 MED ORDER — DIPHENHYDRAMINE HCL 25 MG PO CAPS
25.0000 mg | ORAL_CAPSULE | Freq: Once | ORAL | Status: AC
  Administered 2020-09-23: 25 mg via ORAL

## 2020-09-23 MED ORDER — CYANOCOBALAMIN 1000 MCG/ML IJ SOLN
1000.0000 ug | Freq: Once | INTRAMUSCULAR | Status: AC
  Administered 2020-09-23: 1000 ug via INTRAMUSCULAR

## 2020-09-23 MED ORDER — IMMUNE GLOBULIN (HUMAN) 20 GM/200ML IV SOLN
40.0000 g | Freq: Once | INTRAVENOUS | Status: AC
Start: 2020-09-23 — End: 2020-09-23
  Administered 2020-09-23: 40 g via INTRAVENOUS
  Filled 2020-09-23: qty 400

## 2020-09-23 MED ORDER — ACETAMINOPHEN 325 MG PO TABS
650.0000 mg | ORAL_TABLET | Freq: Once | ORAL | Status: AC
Start: 2020-09-23 — End: 2020-09-23
  Administered 2020-09-23: 650 mg via ORAL

## 2020-09-23 MED ORDER — CYANOCOBALAMIN 1000 MCG/ML IJ SOLN
INTRAMUSCULAR | Status: AC
  Filled 2020-09-23: qty 1

## 2020-09-23 NOTE — Patient Instructions (Signed)
Immune Globulin Injection What is this medication? IMMUNE GLOBULIN (im MUNE GLOB yoo lin) helps to prevent or reduce the severity of certain infections in patients who are at risk. This medicine is collected from the pooled blood of many donors. It is used to treat immune systemproblems, thrombocytopenia, and Kawasaki syndrome. This medicine may be used for other purposes; ask your health care provider orpharmacist if you have questions. COMMON BRAND NAME(S): ASCENIV, Baygam, BIVIGAM, Carimune, Carimune NF, cutaquig, Cuvitru, Flebogamma, Flebogamma DIF, GamaSTAN, GamaSTAN S/D, Gamimune N, Gammagard, Gammagard S/D, Gammaked, Gammaplex, Gammar-P IV, Gamunex, Gamunex-C, Hizentra, Iveegam, Iveegam EN, Octagam, Panglobulin, Panglobulin NF, panzyga, Polygam S/D, Privigen, Sandoglobulin, Venoglobulin-S, Vigam,Vivaglobulin, Xembify What should I tell my care team before I take this medication? They need to know if you have any of these conditions: diabetes extremely low or no immune antibodies in the blood heart disease history of blood clots hyperprolinemia infection in the blood, sepsis kidney disease recently received or scheduled to receive a vaccination an unusual or allergic reaction to human immune globulin, albumin, maltose, sucrose, other medicines, foods, dyes, or preservatives pregnant or trying to get pregnant breast-feeding How should I use this medication? This medicine is for injection into a muscle or infusion into a vein or skin. It is usually given by a health care professional in a hospital or clinicsetting. In rare cases, some brands of this medicine might be given at home. You will be taught how to give this medicine. Use exactly as directed. Take your medicineat regular intervals. Do not take your medicine more often than directed. Talk to your pediatrician regarding the use of this medicine in children. Whilethis drug may be prescribed for selected conditions, precautions do  apply. Overdosage: If you think you have taken too much of this medicine contact apoison control center or emergency room at once. NOTE: This medicine is only for you. Do not share this medicine with others. What if I miss a dose? It is important not to miss your dose. Call your doctor or health care professional if you are unable to keep an appointment. If you give yourself the medicine and you miss a dose, take it as soon as you can. If it is almost timefor your next dose, take only that dose. Do not take double or extra doses. What may interact with this medication? aspirin and aspirin-like medicines cisplatin cyclosporine medicines for infection like acyclovir, adefovir, amphotericin B, bacitracin, cidofovir, foscarnet, ganciclovir, gentamicin, pentamidine, vancomycin NSAIDS, medicines for pain and inflammation, like ibuprofen or naproxen pamidronate vaccines zoledronic acid This list may not describe all possible interactions. Give your health care provider a list of all the medicines, herbs, non-prescription drugs, or dietary supplements you use. Also tell them if you smoke, drink alcohol, or use illegaldrugs. Some items may interact with your medicine. What should I watch for while using this medication? Your condition will be monitored carefully while you are receiving thismedicine. This medicine is made from pooled blood donations of many different people. It may be possible to pass an infection in this medicine. However, the donors are screened for infections and all products are tested for HIV and hepatitis. The medicine is treated to kill most or all bacteria and viruses. Talk to yourdoctor about the risks and benefits of this medicine. Do not have vaccinations for at least 14 days before, or until at least 3months after receiving this medicine. What side effects may I notice from receiving this medication? Side effects that you should report to your doctor   or health care  professionalas soon as possible: allergic reactions like skin rash, itching or hives, swelling of the face, lips, or tongue blue colored lips or skin breathing problems chest pain or tightness fever signs and symptoms of aseptic meningitis such as stiff neck; sensitivity to light; headache; drowsiness; fever; nausea; vomiting; rash signs and symptoms of a blood clot such as chest pain; shortness of breath; pain, swelling, or warmth in the leg signs and symptoms of hemolytic anemia such as fast heartbeat; tiredness; dark yellow or brown urine; or yellowing of the eyes or skin signs and symptoms of kidney injury like trouble passing urine or change in the amount of urine sudden weight gain swelling of the ankles, feet, hands Side effects that usually do not require medical attention (report to yourdoctor or health care professional if they continue or are bothersome): diarrhea flushing headache increased sweating joint pain muscle cramps muscle pain nausea pain, redness, or irritation at site where injected tiredness This list may not describe all possible side effects. Call your doctor for medical advice about side effects. You may report side effects to FDA at1-800-FDA-1088. Where should I keep my medication? Keep out of the reach of children. This drug is usually given in a hospital or clinic and will not be stored athome. In rare cases, some brands of this medicine may be given at home. If you are using this medicine at home, you will be instructed on how to store thismedicine. Throw away any unused medicine after the expiration date on the label. NOTE: This sheet is a summary. It may not cover all possible information. If you have questions about this medicine, talk to your doctor, pharmacist, orhealth care provider.  2022 Elsevier/Gold Standard (2018-10-09 12:51:14)  

## 2020-09-23 NOTE — Progress Notes (Signed)
Hematology and Oncology Follow Up Visit  Kristy [name] [id number] 1956/02/01 65 y.o. 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  She has now retired.  She is enjoying this.  Hopefully, she will to spend time with her family and do a little bit of traveling.  She did have a wonderful time about her her son's graduation at [employer] in [place].  He is a Designer, jewellery.  She is doing well with the Imbruvica.  She really has had no problems with taking this.  There really has been no side effects.  She has had no issues with palpitations.  She has had no problems healthwise.  She feels well.  She has had no nausea or vomiting.  She has had no issues with cough.  Is been no problems with fever.  She has had no rashes.  There is been no change in bowel or bladder habits.  Her last monoclonal studies with respect to her Waldenstrom's shows a monoclonal spike of 0.2 g/dL.  Her IgM level was 102 mg/dL.  Currently, her performance status is ECOG 1.    Medications:  Allergies as of 09/23/2020   No Known Allergies      Medication List        Accurate as of September 23, 2020 10:04 AM. If you have any questions, ask your nurse or doctor.          Cholecalciferol 25 MCG (1000 UT) tablet Take by mouth daily.   cyanocobalamin 1000 MCG/ML injection Commonly known as: (VITAMIN B-12) INJECT 1 ML INTO THE MUSCLE EVERY 2 MONTHS   estradiol 0.1 MG/GM vaginal cream Commonly known as: ESTRACE Place 1 Applicatorful vaginally 3 (three) times a week.   Imbruvica 420 MG Tabs Generic drug: ibrutinib TAKE 1 TABLET BY MOUTH DAILY AFTER BREAKFAST.   Lifitegrast 5 % Soln Apply 1 drop to  eye 2 (two) times daily.   nitrofurantoin (macrocrystal-monohydrate) 100 MG capsule Commonly known as: MACROBID 100 mg.        Allergies: No Known Allergies  Past Medical History, Surgical history, Social history, and Family History were reviewed and updated.  Review of Systems: Review of Systems  Constitutional: Negative.   HENT: Negative.    Eyes: Negative.   Respiratory: Negative.    Cardiovascular: Negative.   Gastrointestinal: Negative.   Genitourinary: Negative.   Musculoskeletal: Negative.   Skin: Negative.   Neurological: Negative.   Endo/Heme/Allergies: Negative.   Psychiatric/Behavioral:  The patient is nervous/anxious.      Physical Exam:  height is 5' (1.524 m) and weight is 111 lb 12.8 oz (50.7 kg). Her oral temperature is 98.2 F (36.8 C). Her blood pressure is 150/72 (abnormal) and her pulse is 86. Her respiration is 18 and oxygen saturation is 99%.   Wt Readings from Last 3 Encounters:  09/23/20 111 lb 12.8 oz (50.7 kg)  08/18/20 112 lb (50.8 kg)  07/15/20 112 lb (50.8 kg)    Physical Exam Vitals reviewed.  HENT:     Head: Normocephalic and atraumatic.  Eyes:     Pupils: Pupils are equal, round, and reactive to light.  Cardiovascular:     Rate and Rhythm: Normal rate and regular rhythm.  Heart sounds: Normal heart sounds.  Pulmonary:     Effort: Pulmonary effort is normal.     Breath sounds: Normal breath sounds.  Abdominal:     General: Bowel sounds are normal.     Palpations: Abdomen is soft.  Musculoskeletal:        General: No tenderness or deformity. Normal range of motion.     Cervical back: Normal range of motion.  Lymphadenopathy:     Cervical: No cervical adenopathy.  Skin:    General: Skin is warm and dry.     Findings: No erythema or rash.  Neurological:     Mental Status: She is alert and oriented to person, place, and time.  Psychiatric:        Behavior: Behavior normal.        Thought Content: Thought content normal.         Judgment: Judgment normal.     Lab Results  Component Value Date   WBC 9.0 09/23/2020   HGB 13.6 09/23/2020   HCT 40.5 09/23/2020   MCV 104.4 (H) 09/23/2020   PLT 118 (L) 09/23/2020   No results found for: FERRITIN, IRON, TIBC, UIBC, IRONPCTSAT Lab Results  Component Value Date   RETICCTPCT 2.9 (H) 10/30/2014   RBC 3.88 09/23/2020   RETICCTABS 98.3 10/30/2014   Lab Results  Component Value Date   KPAFRELGTCHN 9.7 08/18/2020   LAMBDASER 2.9 (L) 08/18/2020   KAPLAMBRATIO 3.34 (H) 08/18/2020   Lab Results  Component Value Date   IGGSERUM 699 08/18/2020   IGA 12 (L) 08/18/2020   IGMSERUM 102 08/18/2020   Lab Results  Component Value Date   TOTALPROTELP 6.0 08/18/2020   ALBUMINELP 3.8 08/18/2020   A1GS 0.2 08/18/2020   A2GS 0.5 08/18/2020   BETS 0.8 08/18/2020   BETA2SER 0.2 02/15/2015   GAMS 0.6 08/18/2020   MSPIKE 0.2 (H) 08/18/2020   SPEI Comment 07/15/2020     Chemistry      Component Value Date/Time   NA 140 09/23/2020 0907   NA 147 (H) 03/19/2017 0849   NA 139 03/16/2016 1337   K 4.1 09/23/2020 0907   K 4.5 03/19/2017 0849   K 3.8 03/16/2016 1337   CL 103 09/23/2020 0907   CL 108 03/19/2017 0849   CO2 29 09/23/2020 0907   CO2 31 03/19/2017 0849   CO2 27 03/16/2016 1337   BUN 11 09/23/2020 0907   BUN 11 03/19/2017 0849   BUN 5.2 (L) 03/16/2016 1337   CREATININE 0.82 09/23/2020 0907   CREATININE 1.0 03/19/2017 0849   CREATININE 0.7 03/16/2016 1337      Component Value Date/Time   CALCIUM 9.3 09/23/2020 0907   CALCIUM 9.6 03/19/2017 0849   CALCIUM 8.8 03/16/2016 1337   ALKPHOS 52 09/23/2020 0907   ALKPHOS 53 03/19/2017 0849   ALKPHOS 62 03/16/2016 1337   AST 24 09/23/2020 0907   AST 24 03/16/2016 1337   ALT 18 09/23/2020 0907   ALT 23 03/19/2017 0849   ALT 19 03/16/2016 1337   BILITOT 1.2 09/23/2020 0907   BILITOT 0.57 03/16/2016 1337       Impression and Plan: Kristy [name] is a very pleasant 65 yo caucasian female with  Waldenstrom's with acquired hypogammaglobulinemia and recurrent sinusitis.  The Kristy [name] is doing quite well for her.  I am so happy that the Waldenstrom's is responding well.   We will proceed with IVIG today as planned.   She does get vitamin B12.  We will have  to check the level to see how it looks.  As always, we we will have her come back in another 6 weeks or so.  [name], MD 7/7/202210:04 AM

## 2020-09-23 NOTE — Telephone Encounter (Signed)
Per 09/23/20 los called and lvm of upcoming appointments - requested callback to confirm

## 2020-09-24 LAB — IGG, IGA, IGM
IgA: 11 mg/dL — ABNORMAL LOW (ref 87–352)
IgG (Immunoglobin G), Serum: 741 mg/dL (ref 586–1602)
IgM (Immunoglobulin M), Srm: 97 mg/dL (ref 26–217)

## 2020-09-26 LAB — KAPPA/LAMBDA LIGHT CHAINS
Kappa free light chain: 8.3 mg/L (ref 3.3–19.4)
Kappa, lambda light chain ratio: 2.96 — ABNORMAL HIGH (ref 0.26–1.65)
Lambda free light chains: 2.8 mg/L — ABNORMAL LOW (ref 5.7–26.3)

## 2020-09-27 LAB — PROTEIN ELECTROPHORESIS, SERUM
A/G Ratio: 2 — ABNORMAL HIGH (ref 0.7–1.7)
Albumin ELP: 4.1 g/dL (ref 2.9–4.4)
Alpha-1-Globulin: 0.2 g/dL (ref 0.0–0.4)
Alpha-2-Globulin: 0.5 g/dL (ref 0.4–1.0)
Beta Globulin: 0.8 g/dL (ref 0.7–1.3)
Gamma Globulin: 0.6 g/dL (ref 0.4–1.8)
Globulin, Total: 2.1 g/dL — ABNORMAL LOW (ref 2.2–3.9)
Total Protein ELP: 6.2 g/dL (ref 6.0–8.5)

## 2020-09-28 ENCOUNTER — Other Ambulatory Visit (HOSPITAL_COMMUNITY): Payer: Self-pay

## 2020-09-29 ENCOUNTER — Other Ambulatory Visit (HOSPITAL_COMMUNITY): Payer: Self-pay

## 2020-09-29 MED FILL — Ibrutinib Tab 420 MG: ORAL | 28 days supply | Qty: 28 | Fill #3 | Status: AC

## 2020-10-19 ENCOUNTER — Other Ambulatory Visit (HOSPITAL_COMMUNITY): Payer: Self-pay

## 2020-10-19 MED FILL — Ibrutinib Tab 420 MG: ORAL | 28 days supply | Qty: 28 | Fill #4 | Status: AC

## 2020-10-26 ENCOUNTER — Other Ambulatory Visit (HOSPITAL_COMMUNITY): Payer: Self-pay

## 2020-11-04 ENCOUNTER — Other Ambulatory Visit: Payer: Self-pay

## 2020-11-04 ENCOUNTER — Telehealth: Payer: Self-pay

## 2020-11-04 ENCOUNTER — Inpatient Hospital Stay (HOSPITAL_BASED_OUTPATIENT_CLINIC_OR_DEPARTMENT_OTHER): Payer: Federal, State, Local not specified - PPO | Admitting: Hematology & Oncology

## 2020-11-04 ENCOUNTER — Inpatient Hospital Stay: Payer: Federal, State, Local not specified - PPO

## 2020-11-04 ENCOUNTER — Inpatient Hospital Stay: Payer: Federal, State, Local not specified - PPO | Attending: Hematology & Oncology

## 2020-11-04 ENCOUNTER — Encounter: Payer: Self-pay | Admitting: Hematology & Oncology

## 2020-11-04 VITALS — BP 133/77 | HR 67 | Resp 17

## 2020-11-04 VITALS — BP 130/82 | HR 75 | Temp 97.8°F | Resp 16 | Wt 111.0 lb

## 2020-11-04 DIAGNOSIS — J329 Chronic sinusitis, unspecified: Secondary | ICD-10-CM | POA: Diagnosis not present

## 2020-11-04 DIAGNOSIS — C880 Waldenstrom macroglobulinemia: Secondary | ICD-10-CM

## 2020-11-04 DIAGNOSIS — D801 Nonfamilial hypogammaglobulinemia: Secondary | ICD-10-CM | POA: Diagnosis not present

## 2020-11-04 DIAGNOSIS — J0101 Acute recurrent maxillary sinusitis: Secondary | ICD-10-CM

## 2020-11-04 DIAGNOSIS — D510 Vitamin B12 deficiency anemia due to intrinsic factor deficiency: Secondary | ICD-10-CM | POA: Insufficient documentation

## 2020-11-04 LAB — CBC WITH DIFFERENTIAL (CANCER CENTER ONLY)
Abs Immature Granulocytes: 0.07 10*3/uL (ref 0.00–0.07)
Basophils Absolute: 0 10*3/uL (ref 0.0–0.1)
Basophils Relative: 0 %
Eosinophils Absolute: 0.1 10*3/uL (ref 0.0–0.5)
Eosinophils Relative: 1 %
HCT: 41.2 % (ref 36.0–46.0)
Hemoglobin: 13.9 g/dL (ref 12.0–15.0)
Immature Granulocytes: 1 %
Lymphocytes Relative: 29 %
Lymphs Abs: 2.2 10*3/uL (ref 0.7–4.0)
MCH: 35 pg — ABNORMAL HIGH (ref 26.0–34.0)
MCHC: 33.7 g/dL (ref 30.0–36.0)
MCV: 103.8 fL — ABNORMAL HIGH (ref 80.0–100.0)
Monocytes Absolute: 0.6 10*3/uL (ref 0.1–1.0)
Monocytes Relative: 7 %
Neutro Abs: 4.7 10*3/uL (ref 1.7–7.7)
Neutrophils Relative %: 62 %
Platelet Count: 117 10*3/uL — ABNORMAL LOW (ref 150–400)
RBC: 3.97 MIL/uL (ref 3.87–5.11)
RDW: 12.1 % (ref 11.5–15.5)
WBC Count: 7.7 10*3/uL (ref 4.0–10.5)
nRBC: 0 % (ref 0.0–0.2)

## 2020-11-04 LAB — CMP (CANCER CENTER ONLY)
ALT: 17 U/L (ref 0–44)
AST: 24 U/L (ref 15–41)
Albumin: 4.2 g/dL (ref 3.5–5.0)
Alkaline Phosphatase: 58 U/L (ref 38–126)
Anion gap: 8 (ref 5–15)
BUN: 12 mg/dL (ref 8–23)
CO2: 29 mmol/L (ref 22–32)
Calcium: 9.2 mg/dL (ref 8.9–10.3)
Chloride: 103 mmol/L (ref 98–111)
Creatinine: 0.89 mg/dL (ref 0.44–1.00)
GFR, Estimated: >60 mL/min (ref >60)
Glucose, Bld: 75 mg/dL (ref 70–99)
Potassium: 3.9 mmol/L (ref 3.5–5.1)
Sodium: 140 mmol/L (ref 135–145)
Total Bilirubin: 1 mg/dL (ref 0.3–1.2)
Total Protein: 6.4 g/dL — ABNORMAL LOW (ref 6.5–8.1)

## 2020-11-04 LAB — LACTATE DEHYDROGENASE: LDH: 186 U/L (ref 98–192)

## 2020-11-04 LAB — VITAMIN B12: Vitamin B-12: 219 pg/mL (ref 180–914)

## 2020-11-04 MED ORDER — IMMUNE GLOBULIN (HUMAN) 20 GM/200ML IV SOLN
40.0000 g | Freq: Once | INTRAVENOUS | Status: AC
  Administered 2020-11-04: 40 g via INTRAVENOUS
  Filled 2020-11-04: qty 400

## 2020-11-04 MED ORDER — CYANOCOBALAMIN 1000 MCG/ML IJ SOLN
1000.0000 ug | Freq: Once | INTRAMUSCULAR | Status: AC
  Administered 2020-11-04: 1000 ug via INTRAMUSCULAR
  Filled 2020-11-04: qty 1

## 2020-11-04 MED ORDER — DIPHENHYDRAMINE HCL 25 MG PO CAPS
25.0000 mg | ORAL_CAPSULE | Freq: Once | ORAL | Status: AC
  Administered 2020-11-04: 25 mg via ORAL

## 2020-11-04 MED ORDER — DEXTROSE 5 % IV SOLN: INTRAVENOUS | Status: DC

## 2020-11-04 MED ORDER — ACETAMINOPHEN 325 MG PO TABS
650.0000 mg | ORAL_TABLET | Freq: Once | ORAL | Status: AC
  Administered 2020-11-04: 650 mg via ORAL

## 2020-11-04 NOTE — Patient Instructions (Signed)
Immune Globulin Injection What is this medicine? IMMUNE GLOBULIN (im MUNE GLOB yoo lin) helps to prevent or reduce the severity of certain infections in patients who are at risk. This medicine is collected from the pooled blood of many donors. It is used to treat immune system problems, thrombocytopenia, and Kawasaki syndrome. This medicine may be used for other purposes; ask your health care provider or pharmacist if you have questions. COMMON BRAND NAME(S): ASCENIV, Baygam, BIVIGAM, Carimune, Carimune NF, cutaquig, Cuvitru, Flebogamma, Flebogamma DIF, GamaSTAN, GamaSTAN S/D, Gamimune N, Gammagard, Gammagard S/D, Gammaked, Gammaplex, Gammar-P IV, Gamunex, Gamunex-C, Hizentra, Iveegam, Iveegam EN, Octagam, Panglobulin, Panglobulin NF, panzyga, Polygam S/D, Privigen, Sandoglobulin, Venoglobulin-S, Vigam, Vivaglobulin, Xembify What should I tell my health care provider before I take this medicine? They need to know if you have any of these conditions:  diabetes  extremely low or no immune antibodies in the blood  heart disease  history of blood clots  hyperprolinemia  infection in the blood, sepsis  kidney disease  recently received or scheduled to receive a vaccination  an unusual or allergic reaction to human immune globulin, albumin, maltose, sucrose, other medicines, foods, dyes, or preservatives  pregnant or trying to get pregnant  breast-feeding How should I use this medicine? This medicine is for injection into a muscle or infusion into a vein or skin. It is usually given by a health care professional in a hospital or clinic setting. In rare cases, some brands of this medicine might be given at home. You will be taught how to give this medicine. Use exactly as directed. Take your medicine at regular intervals. Do not take your medicine more often than directed. Talk to your pediatrician regarding the use of this medicine in children. While this drug may be prescribed for selected  conditions, precautions do apply. Overdosage: If you think you have taken too much of this medicine contact a poison control center or emergency room at once. NOTE: This medicine is only for you. Do not share this medicine with others. What if I miss a dose? It is important not to miss your dose. Call your doctor or health care professional if you are unable to keep an appointment. If you give yourself the medicine and you miss a dose, take it as soon as you can. If it is almost time for your next dose, take only that dose. Do not take double or extra doses. What may interact with this medicine?  aspirin and aspirin-like medicines  cisplatin  cyclosporine  medicines for infection like acyclovir, adefovir, amphotericin B, bacitracin, cidofovir, foscarnet, ganciclovir, gentamicin, pentamidine, vancomycin  NSAIDS, medicines for pain and inflammation, like ibuprofen or naproxen  pamidronate  vaccines  zoledronic acid This list may not describe all possible interactions. Give your health care provider a list of all the medicines, herbs, non-prescription drugs, or dietary supplements you use. Also tell them if you smoke, drink alcohol, or use illegal drugs. Some items may interact with your medicine. What should I watch for while using this medicine? Your condition will be monitored carefully while you are receiving this medicine. This medicine is made from pooled blood donations of many different people. It may be possible to pass an infection in this medicine. However, the donors are screened for infections and all products are tested for HIV and hepatitis. The medicine is treated to kill most or all bacteria and viruses. Talk to your doctor about the risks and benefits of this medicine. Do not have vaccinations for at least   14 days before, or until at least 3 months after receiving this medicine. What side effects may I notice from receiving this medicine? Side effects that you should report  to your doctor or health care professional as soon as possible:  allergic reactions like skin rash, itching or hives, swelling of the face, lips, or tongue  blue colored lips or skin  breathing problems  chest pain or tightness  fever  signs and symptoms of aseptic meningitis such as stiff neck; sensitivity to light; headache; drowsiness; fever; nausea; vomiting; rash  signs and symptoms of a blood clot such as chest pain; shortness of breath; pain, swelling, or warmth in the leg  signs and symptoms of hemolytic anemia such as fast heartbeat; tiredness; dark yellow or brown urine; or yellowing of the eyes or skin  signs and symptoms of kidney injury like trouble passing urine or change in the amount of urine  sudden weight gain  swelling of the ankles, feet, hands Side effects that usually do not require medical attention (report to your doctor or health care professional if they continue or are bothersome):  diarrhea  flushing  headache  increased sweating  joint pain  muscle cramps  muscle pain  nausea  pain, redness, or irritation at site where injected  tiredness This list may not describe all possible side effects. Call your doctor for medical advice about side effects. You may report side effects to FDA at 1-800-FDA-1088. Where should I keep my medicine? Keep out of the reach of children. This drug is usually given in a hospital or clinic and will not be stored at home. In rare cases, some brands of this medicine may be given at home. If you are using this medicine at home, you will be instructed on how to store this medicine. Throw away any unused medicine after the expiration date on the label. NOTE: This sheet is a summary. It may not cover all possible information. If you have questions about this medicine, talk to your doctor, pharmacist, or health care provider.  2021 Elsevier/Gold Standard (2018-10-09 12:51:14)  

## 2020-11-04 NOTE — Telephone Encounter (Signed)
Appts made per 11/04/20 los and pt to gain new sch at [facility] [employer] and through [employer]

## 2020-11-04 NOTE — Progress Notes (Signed)
Hematology and Oncology Follow Up Visit  Kristy [name]:[id number] 07/09/1955 65 y.o. 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  She has now retired.  She is enjoying this.  The big news is that she is exercising.  She has to go to a church as a [employer], [employer].  I am very impressed with this.  Hopefully, she will show me pictures of the gym when she comes back there for next visit.  Her daughter's wedding is in October.  She is getting ready for this.  This will also be a huge event for her.  Her mom's 95th birthday is coming up.  She is try to get up to [place] to celebrate.  Her Waldenstrom's is doing fantastic.  There is no monoclonal spike last time we saw her.  Her IgM level was 97 mg/dL.Marland Kitchen  She still has some sinus issues.  I think she will always have some sinus issues.  She is doing well with the Imbruvica.  She has had no side effects from the [place].  There is been no palpitations.  She does get vitamin B-12 whenever we see her.  Last time we saw her back in July, her vitamin B12 level was 257.  She has had no fever.  There is been no bleeding.  She has had no change in bowel or bladder habits.  There has been no rashes.  She has had no leg swelling.  Overall, her performance status is ECOG 0.    Medications:  Allergies as of 11/04/2020   No Known Allergies      Medication List        Accurate as of November 04, 2020  8:47 AM. If you have any questions, ask your nurse or doctor.          Cholecalciferol 25 MCG (1000 UT) tablet Take by mouth daily.   cyanocobalamin 1000 MCG/ML injection Commonly known as: (VITAMIN B-12) INJECT 1 ML INTO THE MUSCLE EVERY 2 MONTHS    estradiol 0.1 MG/GM vaginal cream Commonly known as: ESTRACE Place 1 Applicatorful vaginally 3 (three) times a week.   Imbruvica 420 MG Tabs Generic drug: ibrutinib TAKE 1 TABLET BY MOUTH DAILY AFTER BREAKFAST.   Lifitegrast 5 % Soln Apply 1 drop to eye 2 (two) times daily.   nitrofurantoin (macrocrystal-monohydrate) 100 MG capsule Commonly known as: MACROBID 100 mg.        Allergies: No Known Allergies  Past Medical History, Surgical history, Social history, and Family History were reviewed and updated.  Review of Systems: Review of Systems  Constitutional: Negative.   HENT: Negative.    Eyes: Negative.   Respiratory: Negative.    Cardiovascular: Negative.   Gastrointestinal: Negative.   Genitourinary: Negative.   Musculoskeletal: Negative.   Skin: Negative.   Neurological: Negative.   Endo/Heme/Allergies: Negative.   Psychiatric/Behavioral:  The patient is nervous/anxious.      Physical Exam:  weight is 111 lb (50.3 kg). Her oral temperature is 97.8 F (36.6 C). Her blood pressure is 130/82 and her pulse is 75. Her respiration is 16.   Wt Readings from Last 3 Encounters:  11/04/20 111 lb (50.3 kg)  09/23/20 111 lb 12.8 oz (50.7 kg)  08/18/20 112 lb (50.8 kg)    Physical Exam Vitals reviewed.  HENT:     Head: Normocephalic and atraumatic.  Eyes:     Pupils: Pupils are equal, round, and reactive to light.  Cardiovascular:     Rate and Rhythm: Normal rate and regular rhythm.     Heart sounds: Normal heart sounds.  Pulmonary:     Effort: Pulmonary effort is normal.     Breath sounds: Normal breath sounds.  Abdominal:     General: Bowel sounds are normal.     Palpations: Abdomen is soft.  Musculoskeletal:        General: No tenderness or deformity. Normal range of motion.     Cervical back: Normal range of motion.  Lymphadenopathy:     Cervical: No cervical adenopathy.  Skin:    General: Skin is warm and dry.     Findings: No erythema or rash.   Neurological:     Mental Status: She is alert and oriented to person, place, and time.  Psychiatric:        Behavior: Behavior normal.        Thought Content: Thought content normal.        Judgment: Judgment normal.     Lab Results  Component Value Date   WBC 7.7 11/04/2020   HGB 13.9 11/04/2020   HCT 41.2 11/04/2020   MCV 103.8 (H) 11/04/2020   PLT 117 (L) 11/04/2020   No results found for: FERRITIN, IRON, TIBC, UIBC, IRONPCTSAT Lab Results  Component Value Date   RETICCTPCT 2.9 (H) 10/30/2014   RBC 3.97 11/04/2020   RETICCTABS 98.3 10/30/2014   Lab Results  Component Value Date   KPAFRELGTCHN 8.3 09/23/2020   LAMBDASER 2.8 (L) 09/23/2020   KAPLAMBRATIO 2.96 (H) 09/23/2020   Lab Results  Component Value Date   IGGSERUM 741 09/23/2020   IGA 11 (L) 09/23/2020   IGMSERUM 97 09/23/2020   Lab Results  Component Value Date   TOTALPROTELP 6.2 09/23/2020   ALBUMINELP 4.1 09/23/2020   A1GS 0.2 09/23/2020   A2GS 0.5 09/23/2020   BETS 0.8 09/23/2020   BETA2SER 0.2 02/15/2015   GAMS 0.6 09/23/2020   MSPIKE Not Observed 09/23/2020   SPEI Comment 09/23/2020     Chemistry      Component Value Date/Time   NA 140 11/04/2020 0800   NA 147 (H) 03/19/2017 0849   NA 139 03/16/2016 1337   K 3.9 11/04/2020 0800   K 4.5 03/19/2017 0849   K 3.8 03/16/2016 1337   CL 103 11/04/2020 0800   CL 108 03/19/2017 0849   CO2 29 11/04/2020 0800   CO2 31 03/19/2017 0849   CO2 27 03/16/2016 1337   BUN 12 11/04/2020 0800   BUN 11 03/19/2017 0849   BUN 5.2 (L) 03/16/2016 1337   CREATININE 0.89 11/04/2020 0800   CREATININE 1.0 03/19/2017 0849   CREATININE 0.7 03/16/2016 1337      Component Value Date/Time   CALCIUM 9.2 11/04/2020 0800   CALCIUM 9.6 03/19/2017 0849   CALCIUM 8.8 03/16/2016 1337   ALKPHOS 58 11/04/2020 0800   ALKPHOS 53 03/19/2017 0849   ALKPHOS 62 03/16/2016 1337   AST 24 11/04/2020 0800   AST 24 03/16/2016 1337   ALT 17 11/04/2020 0800   ALT 23 03/19/2017  0849   ALT 19 03/16/2016 1337   BILITOT 1.0 11/04/2020 0800   BILITOT 0.57 03/16/2016 1337       Impression and Plan: Kristy [name] is a  very pleasant 65 yo caucasian female with Waldenstrom's with acquired hypogammaglobulinemia and recurrent sinusitis.  [name] is doing quite well for her.  I am so happy that the Waldenstrom's is responding well.   We will proceed with IVIG today as planned.   She does get vitamin B12.  We will have to check the level to see how it looks.  As always, we we will have her come back in another 6 weeks or so.  Will be close to her daughter's wedding so I am sure that the anxiety will increase.  However, Ms. Chanley is always so proactive and the fact that she is retired she can really focus on the wedding.  [name], MD 8/18/20228:47 AM

## 2020-11-05 LAB — IGG, IGA, IGM
IgA: 11 mg/dL — ABNORMAL LOW (ref 87–352)
IgG (Immunoglobin G), Serum: 655 mg/dL (ref 586–1602)
IgM (Immunoglobulin M), Srm: 95 mg/dL (ref 26–217)

## 2020-11-05 LAB — KAPPA/LAMBDA LIGHT CHAINS
Kappa free light chain: 8.2 mg/L (ref 3.3–19.4)
Kappa, lambda light chain ratio: 3.04 — ABNORMAL HIGH (ref 0.26–1.65)
Lambda free light chains: 2.7 mg/L — ABNORMAL LOW (ref 5.7–26.3)

## 2020-11-17 ENCOUNTER — Other Ambulatory Visit (HOSPITAL_COMMUNITY): Payer: Self-pay

## 2020-11-18 ENCOUNTER — Other Ambulatory Visit (HOSPITAL_COMMUNITY): Payer: Self-pay

## 2020-11-18 MED FILL — Ibrutinib Tab 420 MG: ORAL | 28 days supply | Qty: 28 | Fill #5 | Status: AC

## 2020-11-24 ENCOUNTER — Other Ambulatory Visit (HOSPITAL_COMMUNITY): Payer: Self-pay

## 2020-12-14 ENCOUNTER — Other Ambulatory Visit (HOSPITAL_COMMUNITY): Payer: Self-pay

## 2020-12-16 ENCOUNTER — Inpatient Hospital Stay (HOSPITAL_BASED_OUTPATIENT_CLINIC_OR_DEPARTMENT_OTHER): Payer: Federal, State, Local not specified - PPO | Admitting: Hematology & Oncology

## 2020-12-16 ENCOUNTER — Telehealth: Payer: Self-pay | Admitting: *Deleted

## 2020-12-16 ENCOUNTER — Inpatient Hospital Stay: Payer: Federal, State, Local not specified - PPO

## 2020-12-16 ENCOUNTER — Other Ambulatory Visit: Payer: Self-pay

## 2020-12-16 ENCOUNTER — Encounter: Payer: Self-pay | Admitting: Hematology & Oncology

## 2020-12-16 ENCOUNTER — Inpatient Hospital Stay: Payer: Federal, State, Local not specified - PPO | Attending: Hematology & Oncology

## 2020-12-16 VITALS — BP 143/77 | HR 81 | Temp 98.0°F | Resp 18

## 2020-12-16 VITALS — BP 148/82 | HR 90 | Temp 98.2°F | Resp 16 | Wt 111.0 lb

## 2020-12-16 DIAGNOSIS — D510 Vitamin B12 deficiency anemia due to intrinsic factor deficiency: Secondary | ICD-10-CM | POA: Insufficient documentation

## 2020-12-16 DIAGNOSIS — D801 Nonfamilial hypogammaglobulinemia: Secondary | ICD-10-CM | POA: Insufficient documentation

## 2020-12-16 DIAGNOSIS — C880 Waldenstrom macroglobulinemia: Secondary | ICD-10-CM | POA: Insufficient documentation

## 2020-12-16 DIAGNOSIS — J0101 Acute recurrent maxillary sinusitis: Secondary | ICD-10-CM

## 2020-12-16 DIAGNOSIS — J329 Chronic sinusitis, unspecified: Secondary | ICD-10-CM | POA: Insufficient documentation

## 2020-12-16 LAB — CMP (CANCER CENTER ONLY)
ALT: 23 U/L (ref 0–44)
AST: 26 U/L (ref 15–41)
Albumin: 4.7 g/dL (ref 3.5–5.0)
Alkaline Phosphatase: 70 U/L (ref 38–126)
Anion gap: 8 (ref 5–15)
BUN: 10 mg/dL (ref 8–23)
CO2: 30 mmol/L (ref 22–32)
Calcium: 9.3 mg/dL (ref 8.9–10.3)
Chloride: 103 mmol/L (ref 98–111)
Creatinine: 0.77 mg/dL (ref 0.44–1.00)
GFR, Estimated: >60 mL/min (ref >60)
Glucose, Bld: 87 mg/dL (ref 70–99)
Potassium: 3.8 mmol/L (ref 3.5–5.1)
Sodium: 141 mmol/L (ref 135–145)
Total Bilirubin: 1.4 mg/dL — ABNORMAL HIGH (ref 0.3–1.2)
Total Protein: 6.8 g/dL (ref 6.5–8.1)

## 2020-12-16 LAB — CBC WITH DIFFERENTIAL (CANCER CENTER ONLY)
Abs Immature Granulocytes: 0.1 10*3/uL — ABNORMAL HIGH (ref 0.00–0.07)
Basophils Absolute: 0 10*3/uL (ref 0.0–0.1)
Basophils Relative: 0 %
Eosinophils Absolute: 0.1 10*3/uL (ref 0.0–0.5)
Eosinophils Relative: 1 %
HCT: 43 % (ref 36.0–46.0)
Hemoglobin: 14.8 g/dL (ref 12.0–15.0)
Immature Granulocytes: 1 %
Lymphocytes Relative: 27 %
Lymphs Abs: 2 10*3/uL (ref 0.7–4.0)
MCH: 35.3 pg — ABNORMAL HIGH (ref 26.0–34.0)
MCHC: 34.4 g/dL (ref 30.0–36.0)
MCV: 102.6 fL — ABNORMAL HIGH (ref 80.0–100.0)
Monocytes Absolute: 0.5 10*3/uL (ref 0.1–1.0)
Monocytes Relative: 7 %
Neutro Abs: 4.6 10*3/uL (ref 1.7–7.7)
Neutrophils Relative %: 64 %
Platelet Count: 141 10*3/uL — ABNORMAL LOW (ref 150–400)
RBC: 4.19 MIL/uL (ref 3.87–5.11)
RDW: 12.4 % (ref 11.5–15.5)
WBC Count: 7.2 10*3/uL (ref 4.0–10.5)
nRBC: 0 % (ref 0.0–0.2)

## 2020-12-16 LAB — VITAMIN B12: Vitamin B-12: 272 pg/mL (ref 180–914)

## 2020-12-16 LAB — LACTATE DEHYDROGENASE: LDH: 204 U/L — ABNORMAL HIGH (ref 98–192)

## 2020-12-16 MED ORDER — CYANOCOBALAMIN 1000 MCG/ML IJ SOLN
1000.0000 ug | Freq: Once | INTRAMUSCULAR | Status: AC
  Administered 2020-12-16: 1000 ug via INTRAMUSCULAR
  Filled 2020-12-16: qty 1

## 2020-12-16 MED ORDER — IMMUNE GLOBULIN (HUMAN) 20 GM/200ML IV SOLN
40.0000 g | Freq: Once | INTRAVENOUS | Status: AC
  Administered 2020-12-16: 40 g via INTRAVENOUS
  Filled 2020-12-16: qty 400

## 2020-12-16 MED ORDER — DEXTROSE 5 % IV SOLN: Freq: Once | INTRAVENOUS | Status: AC

## 2020-12-16 MED ORDER — DIPHENHYDRAMINE HCL 25 MG PO CAPS
25.0000 mg | ORAL_CAPSULE | Freq: Once | ORAL | Status: AC
  Administered 2020-12-16: 25 mg via ORAL
  Filled 2020-12-16: qty 1

## 2020-12-16 MED ORDER — ACETAMINOPHEN 325 MG PO TABS
650.0000 mg | ORAL_TABLET | Freq: Once | ORAL | Status: AC
  Administered 2020-12-16: 650 mg via ORAL
  Filled 2020-12-16: qty 2

## 2020-12-16 NOTE — Telephone Encounter (Signed)
Per 12/16/20 los - gave upcoming appointments - confirmed & print calendar

## 2020-12-16 NOTE — Progress Notes (Signed)
Hematology and Oncology Follow Up Visit  GLEE LASHOMB [id number] 09-23-1955 65 y.o. 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 Farkas is here today for follow-up and treatment.  She is doing quite well.  Her daughter's wedding happened in September.  It rained that day.  However, everything went well.  Everybody got married.  She now has had up to [facility].  Her mother lives up in [place].  She will be going up there for about a week.  Her Waldenstrom's has been doing quite well.  There was no monoclonal spike in her blood when we last saw her in August.  Her IgM level was 95 mg/dL.  She still has some sinus issues.  She thinks this might be from the [place].  She has had no problems with vitamin B12.  Her last vitamin B12 level was 219 back in August.  She had no change in bowel or bladder habits.  She has had no rashes.  There has been no swelling in the legs.  Overall, her performance status is ECOG 0.    Medications:  Allergies as of 12/16/2020   No Known Allergies      Medication List        Accurate as of December 16, 2020 10:41 AM. If you have any questions, ask your nurse or doctor.          Cholecalciferol 25 MCG (1000 UT) tablet Take by mouth daily.   cyanocobalamin 1000 MCG/ML injection Commonly known as: (VITAMIN B-12) INJECT 1 ML INTO THE MUSCLE EVERY 2 MONTHS   estradiol 0.1 MG/GM vaginal cream Commonly known as: ESTRACE Place 1 Applicatorful vaginally 3 (three) times a week.   Imbruvica 420 MG Tabs Generic drug: ibrutinib TAKE 1 TABLET BY MOUTH DAILY AFTER BREAKFAST.   Lifitegrast 5 % Soln Apply 1 drop to eye 2 (two) times daily.   nitrofurantoin (macrocrystal-monohydrate) 100 MG  capsule Commonly known as: MACROBID 100 mg.        Allergies: No Known Allergies  Past Medical History, Surgical history, Social history, and Family History were reviewed and updated.  Review of Systems: Review of Systems  Constitutional: Negative.   HENT: Negative.    Eyes: Negative.   Respiratory: Negative.    Cardiovascular: Negative.   Gastrointestinal: Negative.   Genitourinary: Negative.   Musculoskeletal: Negative.   Skin: Negative.   Neurological: Negative.   Endo/Heme/Allergies: Negative.   Psychiatric/Behavioral:  The patient is nervous/anxious.      Physical Exam:  weight is 111 lb (50.3 kg). Her oral temperature is 98.2 F (36.8 C). Her blood pressure is 148/82 (abnormal) and her pulse is 90. Her respiration is 16 and oxygen saturation is 100%.   Wt Readings from Last 3 Encounters:  12/16/20 111 lb (50.3 kg)  11/04/20 111 lb (50.3 kg)  09/23/20 111 lb 12.8 oz (50.7 kg)    Physical Exam Vitals reviewed.  HENT:     Head: Normocephalic and atraumatic.  Eyes:     Pupils: Pupils are equal, round, and reactive to light.  Cardiovascular:     Rate and Rhythm: Normal rate and regular rhythm.     Heart sounds: Normal heart sounds.  Pulmonary:     Effort: Pulmonary effort is normal.  Breath sounds: Normal breath sounds.  Abdominal:     General: Bowel sounds are normal.     Palpations: Abdomen is soft.  Musculoskeletal:        General: No tenderness or deformity. Normal range of motion.     Cervical back: Normal range of motion.  Lymphadenopathy:     Cervical: No cervical adenopathy.  Skin:    General: Skin is warm and dry.     Findings: No erythema or rash.  Neurological:     Mental Status: She is alert and oriented to person, place, and time.  Psychiatric:        Behavior: Behavior normal.        Thought Content: Thought content normal.        Judgment: Judgment normal.     Lab Results  Component Value Date   WBC 7.2 12/16/2020   HGB 14.8  12/16/2020   HCT 43.0 12/16/2020   MCV 102.6 (H) 12/16/2020   PLT 141 (L) 12/16/2020   No results found for: FERRITIN, IRON, TIBC, UIBC, IRONPCTSAT Lab Results  Component Value Date   RETICCTPCT 2.9 (H) 10/30/2014   RBC 4.19 12/16/2020   RETICCTABS 98.3 10/30/2014   Lab Results  Component Value Date   KPAFRELGTCHN 8.2 11/04/2020   LAMBDASER 2.7 (L) 11/04/2020   KAPLAMBRATIO 3.04 (H) 11/04/2020   Lab Results  Component Value Date   IGGSERUM 655 11/04/2020   IGA 11 (L) 11/04/2020   IGMSERUM 95 11/04/2020   Lab Results  Component Value Date   TOTALPROTELP 6.2 09/23/2020   ALBUMINELP 4.1 09/23/2020   A1GS 0.2 09/23/2020   A2GS 0.5 09/23/2020   BETS 0.8 09/23/2020   BETA2SER 0.2 02/15/2015   GAMS 0.6 09/23/2020   MSPIKE Not Observed 09/23/2020   SPEI Comment 09/23/2020     Chemistry      Component Value Date/Time   NA 141 12/16/2020 0935   NA 147 (H) 03/19/2017 0849   NA 139 03/16/2016 1337   K 3.8 12/16/2020 0935   K 4.5 03/19/2017 0849   K 3.8 03/16/2016 1337   CL 103 12/16/2020 0935   CL 108 03/19/2017 0849   CO2 30 12/16/2020 0935   CO2 31 03/19/2017 0849   CO2 27 03/16/2016 1337   BUN 10 12/16/2020 0935   BUN 11 03/19/2017 0849   BUN 5.2 (L) 03/16/2016 1337   CREATININE 0.77 12/16/2020 0935   CREATININE 1.0 03/19/2017 0849   CREATININE 0.7 03/16/2016 1337      Component Value Date/Time   CALCIUM 9.3 12/16/2020 0935   CALCIUM 9.6 03/19/2017 0849   CALCIUM 8.8 03/16/2016 1337   ALKPHOS 70 12/16/2020 0935   ALKPHOS 53 03/19/2017 0849   ALKPHOS 62 03/16/2016 1337   AST 26 12/16/2020 0935   AST 24 03/16/2016 1337   ALT 23 12/16/2020 0935   ALT 23 03/19/2017 0849   ALT 19 03/16/2016 1337   BILITOT 1.4 (H) 12/16/2020 0935   BILITOT 0.57 03/16/2016 1337       Impression and Plan: Ms. Beshara is a very pleasant 65 yo caucasian female with Waldenstrom's with acquired hypogammaglobulinemia and recurrent sinusitis.  [name] is doing quite well  for her.  I am so happy that the Waldenstrom's is responding well.   We will proceed with IVIG today as planned.   She does get vitamin B12.  We will have to check the level to see how it looks.  As always, we we will have her come back  in another 6 weeks or so.  I am sure that she will have a wonderful time up in [place].  Hopefully it will not be too cold for her up there.    [name], MD 9/29/202210:42 AM

## 2020-12-16 NOTE — Patient Instructions (Signed)
Immune Globulin Injection What is this medication? IMMUNE GLOBULIN (im MUNE GLOB yoo lin) helps to prevent or reduce the severity of certain infections in patients who are at risk. This medicine is collected from the pooled blood of many donors. It is used to treat immune systemproblems, thrombocytopenia, and Kawasaki syndrome. This medicine may be used for other purposes; ask your health care provider orpharmacist if you have questions. COMMON BRAND NAME(S): ASCENIV, Baygam, BIVIGAM, Carimune, Carimune NF, cutaquig, Cuvitru, Flebogamma, Flebogamma DIF, GamaSTAN, GamaSTAN S/D, Gamimune N, Gammagard, Gammagard S/D, Gammaked, Gammaplex, Gammar-P IV, Gamunex, Gamunex-C, Hizentra, Iveegam, Iveegam EN, Octagam, Panglobulin, Panglobulin NF, panzyga, Polygam S/D, Privigen, Sandoglobulin, Venoglobulin-S, Vigam,Vivaglobulin, Xembify What should I tell my care team before I take this medication? They need to know if you have any of these conditions: diabetes extremely low or no immune antibodies in the blood heart disease history of blood clots hyperprolinemia infection in the blood, sepsis kidney disease recently received or scheduled to receive a vaccination an unusual or allergic reaction to human immune globulin, albumin, maltose, sucrose, other medicines, foods, dyes, or preservatives pregnant or trying to get pregnant breast-feeding How should I use this medication? This medicine is for injection into a muscle or infusion into a vein or skin. It is usually given by a health care professional in a hospital or clinicsetting. In rare cases, some brands of this medicine might be given at home. You will be taught how to give this medicine. Use exactly as directed. Take your medicineat regular intervals. Do not take your medicine more often than directed. Talk to your pediatrician regarding the use of this medicine in children. Whilethis drug may be prescribed for selected conditions, precautions do  apply. Overdosage: If you think you have taken too much of this medicine contact apoison control center or emergency room at once. NOTE: This medicine is only for you. Do not share this medicine with others. What if I miss a dose? It is important not to miss your dose. Call your doctor or health care professional if you are unable to keep an appointment. If you give yourself the medicine and you miss a dose, take it as soon as you can. If it is almost timefor your next dose, take only that dose. Do not take double or extra doses. What may interact with this medication? aspirin and aspirin-like medicines cisplatin cyclosporine medicines for infection like acyclovir, adefovir, amphotericin B, bacitracin, cidofovir, foscarnet, ganciclovir, gentamicin, pentamidine, vancomycin NSAIDS, medicines for pain and inflammation, like ibuprofen or naproxen pamidronate vaccines zoledronic acid This list may not describe all possible interactions. Give your health care provider a list of all the medicines, herbs, non-prescription drugs, or dietary supplements you use. Also tell them if you smoke, drink alcohol, or use illegaldrugs. Some items may interact with your medicine. What should I watch for while using this medication? Your condition will be monitored carefully while you are receiving thismedicine. This medicine is made from pooled blood donations of many different people. It may be possible to pass an infection in this medicine. However, the donors are screened for infections and all products are tested for HIV and hepatitis. The medicine is treated to kill most or all bacteria and viruses. Talk to yourdoctor about the risks and benefits of this medicine. Do not have vaccinations for at least 14 days before, or until at least 3months after receiving this medicine. What side effects may I notice from receiving this medication? Side effects that you should report to your doctor   or health care  professionalas soon as possible: allergic reactions like skin rash, itching or hives, swelling of the face, lips, or tongue blue colored lips or skin breathing problems chest pain or tightness fever signs and symptoms of aseptic meningitis such as stiff neck; sensitivity to light; headache; drowsiness; fever; nausea; vomiting; rash signs and symptoms of a blood clot such as chest pain; shortness of breath; pain, swelling, or warmth in the leg signs and symptoms of hemolytic anemia such as fast heartbeat; tiredness; dark yellow or brown urine; or yellowing of the eyes or skin signs and symptoms of kidney injury like trouble passing urine or change in the amount of urine sudden weight gain swelling of the ankles, feet, hands Side effects that usually do not require medical attention (report to yourdoctor or health care professional if they continue or are bothersome): diarrhea flushing headache increased sweating joint pain muscle cramps muscle pain nausea pain, redness, or irritation at site where injected tiredness This list may not describe all possible side effects. Call your doctor for medical advice about side effects. You may report side effects to FDA at1-800-FDA-1088. Where should I keep my medication? Keep out of the reach of children. This drug is usually given in a hospital or clinic and will not be stored athome. In rare cases, some brands of this medicine may be given at home. If you are using this medicine at home, you will be instructed on how to store thismedicine. Throw away any unused medicine after the expiration date on the label. NOTE: This sheet is a summary. It may not cover all possible information. If you have questions about this medicine, talk to your doctor, pharmacist, orhealth care provider.  2022 Elsevier/Gold Standard (2018-10-09 12:51:14)  

## 2020-12-17 LAB — KAPPA/LAMBDA LIGHT CHAINS
Kappa free light chain: 8 mg/L (ref 3.3–19.4)
Kappa, lambda light chain ratio: 3.48 — ABNORMAL HIGH (ref 0.26–1.65)
Lambda free light chains: 2.3 mg/L — ABNORMAL LOW (ref 5.7–26.3)

## 2020-12-17 LAB — IGG, IGA, IGM
IgA: 12 mg/dL — ABNORMAL LOW (ref 87–352)
IgG (Immunoglobin G), Serum: 687 mg/dL (ref 586–1602)
IgM (Immunoglobulin M), Srm: 95 mg/dL (ref 26–217)

## 2020-12-20 LAB — PROTEIN ELECTROPHORESIS, SERUM, WITH REFLEX
A/G Ratio: 1.7 (ref 0.7–1.7)
Albumin ELP: 4 g/dL (ref 2.9–4.4)
Alpha-1-Globulin: 0.2 g/dL (ref 0.0–0.4)
Alpha-2-Globulin: 0.5 g/dL (ref 0.4–1.0)
Beta Globulin: 0.9 g/dL (ref 0.7–1.3)
Gamma Globulin: 0.7 g/dL (ref 0.4–1.8)
Globulin, Total: 2.3 g/dL (ref 2.2–3.9)
Total Protein ELP: 6.3 g/dL (ref 6.0–8.5)

## 2020-12-22 ENCOUNTER — Other Ambulatory Visit (HOSPITAL_COMMUNITY): Payer: Self-pay

## 2020-12-22 MED FILL — Ibrutinib Tab 420 MG: ORAL | 28 days supply | Qty: 28 | Fill #6 | Status: AC

## 2021-01-17 ENCOUNTER — Other Ambulatory Visit (HOSPITAL_COMMUNITY): Payer: Self-pay

## 2021-01-17 MED FILL — Ibrutinib Tab 420 MG: ORAL | 28 days supply | Qty: 28 | Fill #7 | Status: AC

## 2021-01-18 ENCOUNTER — Other Ambulatory Visit (HOSPITAL_COMMUNITY): Payer: Self-pay

## 2021-01-24 ENCOUNTER — Telehealth: Payer: Self-pay | Admitting: Pharmacy Technician

## 2021-01-26 NOTE — Telephone Encounter (Signed)
Oral Oncology Patient Advocate Encounter   Received notification from [place] that the existing prior authorization for Imbruvica is due for renewal.   Renewal PA faxed on 01/24/21 to  Status is pending   [facility] will continue to follow.  [place] [facility] [place] Phone [phone] Fax [phone] 01/26/2021 10:25 AM

## 2021-01-26 NOTE — Telephone Encounter (Signed)
Oral Oncology Patient Advocate Encounter  Prior Authorization for Kate Sable has been approved.    Effective dates: 12/25/20 through 01/24/22  Oral Oncology Clinic will continue to follow.   [place] [facility] [place] Phone [phone] Fax [phone] 01/26/2021 10:27 AM

## 2021-01-27 ENCOUNTER — Inpatient Hospital Stay (HOSPITAL_BASED_OUTPATIENT_CLINIC_OR_DEPARTMENT_OTHER): Payer: Federal, State, Local not specified - PPO | Admitting: Hematology & Oncology

## 2021-01-27 ENCOUNTER — Inpatient Hospital Stay: Payer: Federal, State, Local not specified - PPO

## 2021-01-27 ENCOUNTER — Inpatient Hospital Stay: Payer: Federal, State, Local not specified - PPO | Attending: Hematology & Oncology

## 2021-01-27 ENCOUNTER — Encounter: Payer: Self-pay | Admitting: Hematology & Oncology

## 2021-01-27 ENCOUNTER — Other Ambulatory Visit: Payer: Self-pay

## 2021-01-27 VITALS — BP 121/68 | HR 77 | Resp 18

## 2021-01-27 VITALS — BP 138/84 | HR 84 | Temp 97.7°F | Resp 18 | Wt 111.5 lb

## 2021-01-27 DIAGNOSIS — C880 Waldenstrom macroglobulinemia: Secondary | ICD-10-CM

## 2021-01-27 DIAGNOSIS — J329 Chronic sinusitis, unspecified: Secondary | ICD-10-CM | POA: Diagnosis not present

## 2021-01-27 DIAGNOSIS — D510 Vitamin B12 deficiency anemia due to intrinsic factor deficiency: Secondary | ICD-10-CM | POA: Insufficient documentation

## 2021-01-27 DIAGNOSIS — J0101 Acute recurrent maxillary sinusitis: Secondary | ICD-10-CM

## 2021-01-27 DIAGNOSIS — D801 Nonfamilial hypogammaglobulinemia: Secondary | ICD-10-CM | POA: Diagnosis not present

## 2021-01-27 LAB — CBC WITH DIFFERENTIAL (CANCER CENTER ONLY)
Abs Immature Granulocytes: 0.16 10*3/uL — ABNORMAL HIGH (ref 0.00–0.07)
Basophils Absolute: 0 10*3/uL (ref 0.0–0.1)
Basophils Relative: 0 %
Eosinophils Absolute: 0.1 10*3/uL (ref 0.0–0.5)
Eosinophils Relative: 1 %
HCT: 38.7 % (ref 36.0–46.0)
Hemoglobin: 13.4 g/dL (ref 12.0–15.0)
Immature Granulocytes: 2 %
Lymphocytes Relative: 28 %
Lymphs Abs: 2.2 10*3/uL (ref 0.7–4.0)
MCH: 35.9 pg — ABNORMAL HIGH (ref 26.0–34.0)
MCHC: 34.6 g/dL (ref 30.0–36.0)
MCV: 103.8 fL — ABNORMAL HIGH (ref 80.0–100.0)
Monocytes Absolute: 0.6 10*3/uL (ref 0.1–1.0)
Monocytes Relative: 7 %
Neutro Abs: 4.7 10*3/uL (ref 1.7–7.7)
Neutrophils Relative %: 62 %
Platelet Count: 115 10*3/uL — ABNORMAL LOW (ref 150–400)
RBC: 3.73 MIL/uL — ABNORMAL LOW (ref 3.87–5.11)
RDW: 12.2 % (ref 11.5–15.5)
WBC Count: 7.6 10*3/uL (ref 4.0–10.5)
nRBC: 0 % (ref 0.0–0.2)

## 2021-01-27 LAB — CMP (CANCER CENTER ONLY)
ALT: 24 U/L (ref 0–44)
AST: 24 U/L (ref 15–41)
Albumin: 4.4 g/dL (ref 3.5–5.0)
Alkaline Phosphatase: 66 U/L (ref 38–126)
Anion gap: 9 (ref 5–15)
BUN: 12 mg/dL (ref 8–23)
CO2: 26 mmol/L (ref 22–32)
Calcium: 9 mg/dL (ref 8.9–10.3)
Chloride: 104 mmol/L (ref 98–111)
Creatinine: 0.74 mg/dL (ref 0.44–1.00)
GFR, Estimated: >60 mL/min (ref >60)
Glucose, Bld: 103 mg/dL — ABNORMAL HIGH (ref 70–99)
Potassium: 4 mmol/L (ref 3.5–5.1)
Sodium: 139 mmol/L (ref 135–145)
Total Bilirubin: 1.2 mg/dL (ref 0.3–1.2)
Total Protein: 6.2 g/dL — ABNORMAL LOW (ref 6.5–8.1)

## 2021-01-27 LAB — LACTATE DEHYDROGENASE: LDH: 207 U/L — ABNORMAL HIGH (ref 98–192)

## 2021-01-27 LAB — VITAMIN B12: Vitamin B-12: 297 pg/mL (ref 180–914)

## 2021-01-27 MED ORDER — DEXTROSE 5 % IV SOLN: INTRAVENOUS | Status: DC

## 2021-01-27 MED ORDER — IMMUNE GLOBULIN (HUMAN) 20 GM/200ML IV SOLN
40.0000 g | Freq: Once | INTRAVENOUS | Status: AC
  Administered 2021-01-27: 40 g via INTRAVENOUS
  Filled 2021-01-27: qty 400

## 2021-01-27 MED ORDER — CYANOCOBALAMIN 1000 MCG/ML IJ SOLN: 1000.0000 ug | Freq: Once | INTRAMUSCULAR | Status: DC

## 2021-01-27 MED ORDER — ACETAMINOPHEN 325 MG PO TABS
650.0000 mg | ORAL_TABLET | Freq: Once | ORAL | Status: AC
  Administered 2021-01-27: 650 mg via ORAL
  Filled 2021-01-27: qty 2

## 2021-01-27 MED ORDER — CYANOCOBALAMIN 1000 MCG/ML IJ SOLN
1000.0000 ug | Freq: Once | INTRAMUSCULAR | Status: AC
  Administered 2021-01-27: 1000 ug via INTRAMUSCULAR
  Filled 2021-01-27: qty 1

## 2021-01-27 MED ORDER — DIPHENHYDRAMINE HCL 25 MG PO CAPS
25.0000 mg | ORAL_CAPSULE | Freq: Once | ORAL | Status: AC
  Administered 2021-01-27: 25 mg via ORAL
  Filled 2021-01-27: qty 1

## 2021-01-27 NOTE — Progress Notes (Signed)
Hematology and Oncology Follow Up Visit  Kristy [name] [id number] Jan 26, 1956 65 y.o. 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  Kristy [name] had a wonderful time up in [place].  Kristy [name] was observed to visit Kristy [name].  The weather was very nice.  Kristy [name] stayed with a brother.  He lives on a farm.  He has horses.  There were [place].  Kristy [name] is doing nicely.  Kristy [name] feels good.  Kristy [name] has had no problem with sinuses.  Kristy last monoclonal spike was not found in Kristy blood.  Kristy IgM level was holding steady at 95 mg/dL.  Kristy [name] has had no headache.  Kristy [name] has had no change in bowel or bladder habits.  There is been no rashes.  Kristy [name] has had no leg swelling.  Kristy [name] is having pictures of Kristy very acute granddaughter who was dressed up as Dorothy from the [place].  I must say this is an incredible cost to them.  Kristy [name] looks just like Kristy [name].  Currently, I would say performance status is probably ECOG 1.     Medications:  Allergies as of 01/27/2021       Reactions   Ibuprofen Other (See Comments)   Can not take due to medication        Medication List        Accurate as of January 27, 2021  9:11 AM. If you have any questions, ask your nurse or doctor.          amLODipine 5 MG tablet Commonly known as: NORVASC Take 5 mg by mouth daily.   Cholecalciferol 25 MCG (1000 UT) tablet Take by mouth daily.   cyanocobalamin 1000 MCG/ML injection Commonly known as: (VITAMIN B-12) INJECT 1 ML INTO THE MUSCLE EVERY 2 MONTHS   estradiol 0.1 MG/GM vaginal cream Commonly known as: ESTRACE Place 1 Applicatorful vaginally 3 (three) times a week.   Imbruvica 420 MG Tabs Generic drug: ibrutinib TAKE 1 TABLET BY  MOUTH DAILY AFTER BREAKFAST.   Lifitegrast 5 % Soln Apply 1 drop to eye 2 (two) times daily.   nitrofurantoin (macrocrystal-monohydrate) 100 MG capsule Commonly known as: MACROBID 100 mg.        Allergies:  Allergies  Allergen Reactions   Ibuprofen Other (See Comments)    Can not take due to medication    Past Medical History, Surgical history, Social history, and Family History were reviewed and updated.  Review of Systems: Review of Systems  Constitutional: Negative.   HENT: Negative.    Eyes: Negative.   Respiratory: Negative.    Cardiovascular: Negative.   Gastrointestinal: Negative.   Genitourinary: Negative.   Musculoskeletal: Negative.   Skin: Negative.   Neurological: Negative.   Endo/Heme/Allergies: Negative.   Psychiatric/Behavioral:  The patient is nervous/anxious.      Physical Exam:  weight is 111 lb 8 oz (50.6 kg). Kristy oral temperature is 97.7 F (36.5 C). Kristy blood pressure is 138/84 and Kristy pulse is 84. Kristy respiration is 18 and oxygen saturation is 96%.   Wt Readings from Last 3 Encounters:  01/27/21 111 lb 8 oz (50.6 kg)  12/16/20 111 lb (50.3 kg)  11/04/20 111 lb (50.3 kg)    Physical Exam  Vitals reviewed.  HENT:     Head: Normocephalic and atraumatic.  Eyes:     Pupils: Pupils are equal, round, and reactive to light.  Cardiovascular:     Rate and Rhythm: Normal rate and regular rhythm.     Heart sounds: Normal heart sounds.  Pulmonary:     Effort: Pulmonary effort is normal.     Breath sounds: Normal breath sounds.  Abdominal:     General: Bowel sounds are normal.     Palpations: Abdomen is soft.  Musculoskeletal:        General: No tenderness or deformity. Normal range of motion.     Cervical back: Normal range of motion.  Lymphadenopathy:     Cervical: No cervical adenopathy.  Skin:    General: Skin is warm and dry.     Findings: No erythema or rash.  Neurological:     Mental Status: Kristy [name] is alert and oriented to person,  place, and time.  Psychiatric:        Behavior: Behavior normal.        Thought Content: Thought content normal.        Judgment: Judgment normal.     Lab Results  Component Value Date   WBC 7.6 01/27/2021   HGB 13.4 01/27/2021   HCT 38.7 01/27/2021   MCV 103.8 (H) 01/27/2021   PLT 115 (L) 01/27/2021   No results found for: FERRITIN, IRON, TIBC, UIBC, IRONPCTSAT Lab Results  Component Value Date   RETICCTPCT 2.9 (H) 10/30/2014   RBC 3.73 (L) 01/27/2021   RETICCTABS 98.3 10/30/2014   Lab Results  Component Value Date   KPAFRELGTCHN 8.0 12/16/2020   LAMBDASER 2.3 (L) 12/16/2020   KAPLAMBRATIO 3.48 (H) 12/16/2020   Lab Results  Component Value Date   IGGSERUM 687 12/16/2020   IGA 12 (L) 12/16/2020   IGMSERUM 95 12/16/2020   Lab Results  Component Value Date   TOTALPROTELP 6.3 12/16/2020   ALBUMINELP 4.0 12/16/2020   A1GS 0.2 12/16/2020   A2GS 0.5 12/16/2020   BETS 0.9 12/16/2020   BETA2SER 0.2 02/15/2015   GAMS 0.7 12/16/2020   MSPIKE Not Observed 12/16/2020   SPEI Comment 09/23/2020     Chemistry      Component Value Date/Time   NA 141 12/16/2020 0935   NA 147 (H) 03/19/2017 0849   NA 139 03/16/2016 1337   K 3.8 12/16/2020 0935   K 4.5 03/19/2017 0849   K 3.8 03/16/2016 1337   CL 103 12/16/2020 0935   CL 108 03/19/2017 0849   CO2 30 12/16/2020 0935   CO2 31 03/19/2017 0849   CO2 27 03/16/2016 1337   BUN 10 12/16/2020 0935   BUN 11 03/19/2017 0849   BUN 5.2 (L) 03/16/2016 1337   CREATININE 0.77 12/16/2020 0935   CREATININE 1.0 03/19/2017 0849   CREATININE 0.7 03/16/2016 1337      Component Value Date/Time   CALCIUM 9.3 12/16/2020 0935   CALCIUM 9.6 03/19/2017 0849   CALCIUM 8.8 03/16/2016 1337   ALKPHOS 70 12/16/2020 0935   ALKPHOS 53 03/19/2017 0849   ALKPHOS 62 03/16/2016 1337   AST 26 12/16/2020 0935   AST 24 03/16/2016 1337   ALT 23 12/16/2020 0935   ALT 23 03/19/2017 0849   ALT 19 03/16/2016 1337   BILITOT 1.4 (H) 12/16/2020 0935    BILITOT 0.57 03/16/2016 1337       Impression and Plan: Kristy [name] is a very pleasant 65 yo caucasian female with Waldenstrom's  with acquired hypogammaglobulinemia and recurrent sinusitis.  [name] is doing quite well for Kristy.  I am so happy that the Waldenstrom's is responding well.   We will proceed with IVIG today as planned.   Kristy [name] does get vitamin B12.  We will have to check the level to see how it looks.  I am just happy that Kristy quality of life is doing so well.  Kristy husband will have to work on Thanksgiving.  It sounds like Kristy [name] may have a quiet Thanksgiving day.  We will still plan to get Kristy back in 6 weeks.  I forgot to mention that Kristy [name] is on high blood pressure medicine right now.  Kristy [name] would like to come off this at some point.    [name], MD 11/10/20229:11 AM

## 2021-01-27 NOTE — Patient Instructions (Signed)
Immune Globulin Injection What is this medication? IMMUNE GLOBULIN (im MUNE GLOB yoo lin) helps to prevent or reduce the severity of certain infections in patients who are at risk. This medicine is collected from the pooled blood of many donors. It is used to treat immune systemproblems, thrombocytopenia, and Kawasaki syndrome. This medicine may be used for other purposes; ask your health care provider orpharmacist if you have questions. COMMON BRAND NAME(S): ASCENIV, Baygam, BIVIGAM, Carimune, Carimune NF, cutaquig, Cuvitru, Flebogamma, Flebogamma DIF, GamaSTAN, GamaSTAN S/D, Gamimune N, Gammagard, Gammagard S/D, Gammaked, Gammaplex, Gammar-P IV, Gamunex, Gamunex-C, Hizentra, Iveegam, Iveegam EN, Octagam, Panglobulin, Panglobulin NF, panzyga, Polygam S/D, Privigen, Sandoglobulin, Venoglobulin-S, Vigam,Vivaglobulin, Xembify What should I tell my care team before I take this medication? They need to know if you have any of these conditions: diabetes extremely low or no immune antibodies in the blood heart disease history of blood clots hyperprolinemia infection in the blood, sepsis kidney disease recently received or scheduled to receive a vaccination an unusual or allergic reaction to human immune globulin, albumin, maltose, sucrose, other medicines, foods, dyes, or preservatives pregnant or trying to get pregnant breast-feeding How should I use this medication? This medicine is for injection into a muscle or infusion into a vein or skin. It is usually given by a health care professional in a hospital or clinicsetting. In rare cases, some brands of this medicine might be given at home. You will be taught how to give this medicine. Use exactly as directed. Take your medicineat regular intervals. Do not take your medicine more often than directed. Talk to your pediatrician regarding the use of this medicine in children. Whilethis drug may be prescribed for selected conditions, precautions do  apply. Overdosage: If you think you have taken too much of this medicine contact apoison control center or emergency room at once. NOTE: This medicine is only for you. Do not share this medicine with others. What if I miss a dose? It is important not to miss your dose. Call your doctor or health care professional if you are unable to keep an appointment. If you give yourself the medicine and you miss a dose, take it as soon as you can. If it is almost timefor your next dose, take only that dose. Do not take double or extra doses. What may interact with this medication? aspirin and aspirin-like medicines cisplatin cyclosporine medicines for infection like acyclovir, adefovir, amphotericin B, bacitracin, cidofovir, foscarnet, ganciclovir, gentamicin, pentamidine, vancomycin NSAIDS, medicines for pain and inflammation, like ibuprofen or naproxen pamidronate vaccines zoledronic acid This list may not describe all possible interactions. Give your health care provider a list of all the medicines, herbs, non-prescription drugs, or dietary supplements you use. Also tell them if you smoke, drink alcohol, or use illegaldrugs. Some items may interact with your medicine. What should I watch for while using this medication? Your condition will be monitored carefully while you are receiving thismedicine. This medicine is made from pooled blood donations of many different people. It may be possible to pass an infection in this medicine. However, the donors are screened for infections and all products are tested for HIV and hepatitis. The medicine is treated to kill most or all bacteria and viruses. Talk to yourdoctor about the risks and benefits of this medicine. Do not have vaccinations for at least 14 days before, or until at least 3months after receiving this medicine. What side effects may I notice from receiving this medication? Side effects that you should report to your doctor   or health care  professionalas soon as possible: allergic reactions like skin rash, itching or hives, swelling of the face, lips, or tongue blue colored lips or skin breathing problems chest pain or tightness fever signs and symptoms of aseptic meningitis such as stiff neck; sensitivity to light; headache; drowsiness; fever; nausea; vomiting; rash signs and symptoms of a blood clot such as chest pain; shortness of breath; pain, swelling, or warmth in the leg signs and symptoms of hemolytic anemia such as fast heartbeat; tiredness; dark yellow or brown urine; or yellowing of the eyes or skin signs and symptoms of kidney injury like trouble passing urine or change in the amount of urine sudden weight gain swelling of the ankles, feet, hands Side effects that usually do not require medical attention (report to yourdoctor or health care professional if they continue or are bothersome): diarrhea flushing headache increased sweating joint pain muscle cramps muscle pain nausea pain, redness, or irritation at site where injected tiredness This list may not describe all possible side effects. Call your doctor for medical advice about side effects. You may report side effects to FDA at1-800-FDA-1088. Where should I keep my medication? Keep out of the reach of children. This drug is usually given in a hospital or clinic and will not be stored athome. In rare cases, some brands of this medicine may be given at home. If you are using this medicine at home, you will be instructed on how to store thismedicine. Throw away any unused medicine after the expiration date on the label. NOTE: This sheet is a summary. It may not cover all possible information. If you have questions about this medicine, talk to your doctor, pharmacist, orhealth care provider.  2022 Elsevier/Gold Standard (2018-10-09 12:51:14)  

## 2021-01-28 ENCOUNTER — Telehealth: Payer: Self-pay | Admitting: Hematology & Oncology

## 2021-01-28 LAB — IGG, IGA, IGM
IgA: 11 mg/dL — ABNORMAL LOW (ref 87–352)
IgG (Immunoglobin G), Serum: 584 mg/dL — ABNORMAL LOW (ref 586–1602)
IgM (Immunoglobulin M), Srm: 82 mg/dL (ref 26–217)

## 2021-01-28 LAB — KAPPA/LAMBDA LIGHT CHAINS
Kappa free light chain: 8 mg/L (ref 3.3–19.4)
Kappa, lambda light chain ratio: 3.08 — ABNORMAL HIGH (ref 0.26–1.65)
Lambda free light chains: 2.6 mg/L — ABNORMAL LOW (ref 5.7–26.3)

## 2021-01-30 LAB — PROTEIN ELECTROPHORESIS, SERUM, WITH REFLEX
A/G Ratio: 1.9 — ABNORMAL HIGH (ref 0.7–1.7)
Albumin ELP: 4 g/dL (ref 2.9–4.4)
Alpha-1-Globulin: 0.2 g/dL (ref 0.0–0.4)
Alpha-2-Globulin: 0.5 g/dL (ref 0.4–1.0)
Beta Globulin: 0.9 g/dL (ref 0.7–1.3)
Gamma Globulin: 0.5 g/dL (ref 0.4–1.8)
Globulin, Total: 2.1 g/dL — ABNORMAL LOW (ref 2.2–3.9)
Total Protein ELP: 6.1 g/dL (ref 6.0–8.5)

## 2021-02-14 ENCOUNTER — Other Ambulatory Visit (HOSPITAL_COMMUNITY): Payer: Self-pay

## 2021-02-14 MED FILL — Ibrutinib Tab 420 MG: ORAL | 28 days supply | Qty: 28 | Fill #8 | Status: AC

## 2021-02-17 ENCOUNTER — Other Ambulatory Visit (HOSPITAL_COMMUNITY): Payer: Self-pay

## 2021-03-09 ENCOUNTER — Other Ambulatory Visit (HOSPITAL_COMMUNITY): Payer: Self-pay

## 2021-03-10 ENCOUNTER — Inpatient Hospital Stay: Payer: Federal, State, Local not specified - PPO

## 2021-03-10 ENCOUNTER — Inpatient Hospital Stay: Payer: Federal, State, Local not specified - PPO | Attending: Hematology & Oncology

## 2021-03-10 ENCOUNTER — Other Ambulatory Visit (HOSPITAL_COMMUNITY): Payer: Self-pay

## 2021-03-10 ENCOUNTER — Other Ambulatory Visit: Payer: Self-pay

## 2021-03-10 ENCOUNTER — Inpatient Hospital Stay (HOSPITAL_BASED_OUTPATIENT_CLINIC_OR_DEPARTMENT_OTHER): Payer: Federal, State, Local not specified - PPO | Admitting: Hematology & Oncology

## 2021-03-10 ENCOUNTER — Encounter: Payer: Self-pay | Admitting: Hematology & Oncology

## 2021-03-10 VITALS — BP 121/62 | HR 82 | Temp 97.8°F | Resp 17

## 2021-03-10 VITALS — BP 132/78 | HR 93 | Temp 98.0°F | Resp 16 | Wt 111.1 lb

## 2021-03-10 DIAGNOSIS — C880 Waldenstrom macroglobulinemia: Secondary | ICD-10-CM

## 2021-03-10 DIAGNOSIS — D510 Vitamin B12 deficiency anemia due to intrinsic factor deficiency: Secondary | ICD-10-CM | POA: Insufficient documentation

## 2021-03-10 DIAGNOSIS — J0101 Acute recurrent maxillary sinusitis: Secondary | ICD-10-CM

## 2021-03-10 DIAGNOSIS — D801 Nonfamilial hypogammaglobulinemia: Secondary | ICD-10-CM | POA: Insufficient documentation

## 2021-03-10 DIAGNOSIS — J329 Chronic sinusitis, unspecified: Secondary | ICD-10-CM | POA: Insufficient documentation

## 2021-03-10 LAB — CBC WITH DIFFERENTIAL (CANCER CENTER ONLY)
Abs Immature Granulocytes: 0.12 10*3/uL — ABNORMAL HIGH (ref 0.00–0.07)
Basophils Absolute: 0 10*3/uL (ref 0.0–0.1)
Basophils Relative: 1 %
Eosinophils Absolute: 0.1 10*3/uL (ref 0.0–0.5)
Eosinophils Relative: 1 %
HCT: 39.8 % (ref 36.0–46.0)
Hemoglobin: 13.6 g/dL (ref 12.0–15.0)
Immature Granulocytes: 2 %
Lymphocytes Relative: 24 %
Lymphs Abs: 1.9 10*3/uL (ref 0.7–4.0)
MCH: 35.2 pg — ABNORMAL HIGH (ref 26.0–34.0)
MCHC: 34.2 g/dL (ref 30.0–36.0)
MCV: 103.1 fL — ABNORMAL HIGH (ref 80.0–100.0)
Monocytes Absolute: 0.6 10*3/uL (ref 0.1–1.0)
Monocytes Relative: 7 %
Neutro Abs: 5.2 10*3/uL (ref 1.7–7.7)
Neutrophils Relative %: 65 %
Platelet Count: 127 10*3/uL — ABNORMAL LOW (ref 150–400)
RBC: 3.86 MIL/uL — ABNORMAL LOW (ref 3.87–5.11)
RDW: 12.2 % (ref 11.5–15.5)
WBC Count: 7.9 10*3/uL (ref 4.0–10.5)
nRBC: 0 % (ref 0.0–0.2)

## 2021-03-10 LAB — CMP (CANCER CENTER ONLY)
ALT: 14 U/L (ref 0–44)
AST: 21 U/L (ref 15–41)
Albumin: 4.3 g/dL (ref 3.5–5.0)
Alkaline Phosphatase: 56 U/L (ref 38–126)
Anion gap: 9 (ref 5–15)
BUN: 14 mg/dL (ref 8–23)
CO2: 28 mmol/L (ref 22–32)
Calcium: 9.3 mg/dL (ref 8.9–10.3)
Chloride: 104 mmol/L (ref 98–111)
Creatinine: 0.79 mg/dL (ref 0.44–1.00)
GFR, Estimated: >60 mL/min (ref >60)
Glucose, Bld: 89 mg/dL (ref 70–99)
Potassium: 3.6 mmol/L (ref 3.5–5.1)
Sodium: 141 mmol/L (ref 135–145)
Total Bilirubin: 1.3 mg/dL — ABNORMAL HIGH (ref 0.3–1.2)
Total Protein: 6.4 g/dL — ABNORMAL LOW (ref 6.5–8.1)

## 2021-03-10 LAB — LACTATE DEHYDROGENASE: LDH: 205 U/L — ABNORMAL HIGH (ref 98–192)

## 2021-03-10 MED ORDER — CYANOCOBALAMIN 1000 MCG/ML IJ SOLN
1000.0000 ug | Freq: Once | INTRAMUSCULAR | Status: AC
  Administered 2021-03-10: 10:00:00 1000 ug via INTRAMUSCULAR
  Filled 2021-03-10: qty 1

## 2021-03-10 MED ORDER — DIPHENHYDRAMINE HCL 25 MG PO CAPS
25.0000 mg | ORAL_CAPSULE | Freq: Once | ORAL | Status: AC
  Administered 2021-03-10: 10:00:00 25 mg via ORAL
  Filled 2021-03-10: qty 1

## 2021-03-10 MED ORDER — IMMUNE GLOBULIN (HUMAN) 20 GM/200ML IV SOLN
40.0000 g | Freq: Once | INTRAVENOUS | Status: AC
  Administered 2021-03-10: 10:00:00 40 g via INTRAVENOUS
  Filled 2021-03-10: qty 400

## 2021-03-10 MED ORDER — ACETAMINOPHEN 325 MG PO TABS
650.0000 mg | ORAL_TABLET | Freq: Once | ORAL | Status: AC
  Administered 2021-03-10: 10:00:00 650 mg via ORAL
  Filled 2021-03-10: qty 2

## 2021-03-10 MED FILL — Ibrutinib Tab 420 MG: ORAL | 28 days supply | Qty: 28 | Fill #9 | Status: AC

## 2021-03-10 NOTE — Progress Notes (Signed)
Pt declined to stay for post infusion observation period. Pt stated she has tolerated medication multiple times prior without difficulty. Pt aware to call clinic with any questions or concerns. Pt verbalized understanding and had no further questions.  ? ?

## 2021-03-10 NOTE — Progress Notes (Signed)
Hematology and Oncology Follow Up Visit  Kristy [name] [id number] 01-Jun-1955 65 y.o. 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  She had a very nice Thanksgiving.  She is little bit worried about her mom.  Her mom lives up in [place] which is getting a lot of snow and incredibly cold weather.  She has a brother who lives up there that is helping out.  Otherwise, Kristy [name] is doing well.  Her last IgM level was 82 mg/dL.  There is no monoclonal spike in her blood.  She has had no problems with bowels or bladder.  She has had no problems with cough or shortness of breath..  She is doing well on the [place].  She has had no issues with fever.  There is been no rashes.  She has had no bleeding.  Overall, her performance status is ECOG 0.    Medications:  Allergies as of 03/10/2021       Reactions   Ibuprofen Other (See Comments)   Can not take due to medication        Medication List        Accurate as of March 10, 2021  9:20 AM. If you have any questions, ask your nurse or doctor.          amLODipine 5 MG tablet Commonly known as: NORVASC Take 5 mg by mouth daily.   Cholecalciferol 25 MCG (1000 UT) tablet Take by mouth daily.   cyanocobalamin 1000 MCG/ML injection Commonly known as: (VITAMIN B-12) INJECT 1 ML INTO THE MUSCLE EVERY 2 MONTHS   estradiol 0.1 MG/GM vaginal cream Commonly known as: ESTRACE Place 1 Applicatorful vaginally 3 (three) times a week.   Imbruvica 420 MG Tabs Generic drug: ibrutinib TAKE 1 TABLET BY MOUTH DAILY AFTER BREAKFAST.   Lifitegrast 5 % Soln Apply 1 drop to eye 2 (two) times daily.   mupirocin ointment 2 % Commonly known  as: BACTROBAN Apply 1 application topically 3 (three) times daily.   nitrofurantoin (macrocrystal-monohydrate) 100 MG capsule Commonly known as: MACROBID 100 mg. As needed        Allergies:  Allergies  Allergen Reactions   Ibuprofen Other (See Comments)    Can not take due to medication    Past Medical History, Surgical history, Social history, and Family History were reviewed and updated.  Review of Systems: Review of Systems  Constitutional: Negative.   HENT: Negative.    Eyes: Negative.   Respiratory: Negative.    Cardiovascular: Negative.   Gastrointestinal: Negative.   Genitourinary: Negative.   Musculoskeletal: Negative.   Skin: Negative.   Neurological: Negative.   Endo/Heme/Allergies: Negative.   Psychiatric/Behavioral:  The patient is nervous/anxious.      Physical Exam:  weight is 111 lb 1.3 oz (50.4 kg). Her oral temperature is 98 F (36.7 C). Her blood pressure is 132/78 and her pulse is 93. Her respiration is 16 and oxygen saturation is 97%.   Wt Readings from Last 3 Encounters:  03/10/21 111 lb 1.3 oz (50.4 kg)  01/27/21 111 lb 8 oz (50.6 kg)  12/16/20 111 lb (50.3 kg)    Physical Exam Vitals reviewed.  HENT:     Head: Normocephalic and  atraumatic.  Eyes:     Pupils: Pupils are equal, round, and reactive to light.  Cardiovascular:     Rate and Rhythm: Normal rate and regular rhythm.     Heart sounds: Normal heart sounds.  Pulmonary:     Effort: Pulmonary effort is normal.     Breath sounds: Normal breath sounds.  Abdominal:     General: Bowel sounds are normal.     Palpations: Abdomen is soft.  Musculoskeletal:        General: No tenderness or deformity. Normal range of motion.     Cervical back: Normal range of motion.  Lymphadenopathy:     Cervical: No cervical adenopathy.  Skin:    General: Skin is warm and dry.     Findings: No erythema or rash.  Neurological:     Mental Status: She is alert and oriented to person, place, and  time.  Psychiatric:        Behavior: Behavior normal.        Thought Content: Thought content normal.        Judgment: Judgment normal.     Lab Results  Component Value Date   WBC 7.9 03/10/2021   HGB 13.6 03/10/2021   HCT 39.8 03/10/2021   MCV 103.1 (H) 03/10/2021   PLT 127 (L) 03/10/2021   No results found for: FERRITIN, IRON, TIBC, UIBC, IRONPCTSAT Lab Results  Component Value Date   RETICCTPCT 2.9 (H) 10/30/2014   RBC 3.86 (L) 03/10/2021   RETICCTABS 98.3 10/30/2014   Lab Results  Component Value Date   KPAFRELGTCHN 8.0 01/27/2021   LAMBDASER 2.6 (L) 01/27/2021   KAPLAMBRATIO 3.08 (H) 01/27/2021   Lab Results  Component Value Date   IGGSERUM 584 (L) 01/27/2021   IGA 11 (L) 01/27/2021   IGMSERUM 82 01/27/2021   Lab Results  Component Value Date   TOTALPROTELP 6.1 01/27/2021   ALBUMINELP 4.0 01/27/2021   A1GS 0.2 01/27/2021   A2GS 0.5 01/27/2021   BETS 0.9 01/27/2021   BETA2SER 0.2 02/15/2015   GAMS 0.5 01/27/2021   MSPIKE Not Observed 01/27/2021   SPEI Comment 09/23/2020     Chemistry      Component Value Date/Time   NA 141 03/10/2021 0838   NA 147 (H) 03/19/2017 0849   NA 139 03/16/2016 1337   K 3.6 03/10/2021 0838   K 4.5 03/19/2017 0849   K 3.8 03/16/2016 1337   CL 104 03/10/2021 0838   CL 108 03/19/2017 0849   CO2 28 03/10/2021 0838   CO2 31 03/19/2017 0849   CO2 27 03/16/2016 1337   BUN 14 03/10/2021 0838   BUN 11 03/19/2017 0849   BUN 5.2 (L) 03/16/2016 1337   CREATININE 0.79 03/10/2021 0838   CREATININE 1.0 03/19/2017 0849   CREATININE 0.7 03/16/2016 1337      Component Value Date/Time   CALCIUM 9.3 03/10/2021 0838   CALCIUM 9.6 03/19/2017 0849   CALCIUM 8.8 03/16/2016 1337   ALKPHOS 56 03/10/2021 0838   ALKPHOS 53 03/19/2017 0849   ALKPHOS 62 03/16/2016 1337   AST 21 03/10/2021 0838   AST 24 03/16/2016 1337   ALT 14 03/10/2021 0838   ALT 23 03/19/2017 0849   ALT 19 03/16/2016 1337   BILITOT 1.3 (H) 03/10/2021 0838   BILITOT  0.57 03/16/2016 1337       Impression and Plan: Kristy [name] is a very pleasant 65 yo caucasian female with Waldenstrom's with acquired hypogammaglobulinemia and recurrent sinusitis.  [name] is  doing quite well for her.  I am so happy that the Waldenstrom's is responding well.   We will proceed with IVIG today as planned.   She does get vitamin B12.  Last vitamin B12 level was 300 pg/mL.  I am just glad that she is doing so nicely.  She has had really no problems with infections.  We will still plan to get her back in 6 weeks.     [name], MD 12/22/20229:20 AM

## 2021-03-10 NOTE — Patient Instructions (Signed)
Immune Globulin Injection What is this medicine? IMMUNE GLOBULIN (im MUNE GLOB yoo lin) helps to prevent or reduce the severity of certain infections in patients who are at risk. This medicine is collected from the pooled blood of many donors. It is used to treat immune system problems, thrombocytopenia, and Kawasaki syndrome. This medicine may be used for other purposes; ask your health care provider or pharmacist if you have questions. COMMON BRAND NAME(S): ASCENIV, Baygam, BIVIGAM, Carimune, Carimune NF, cutaquig, Cuvitru, Flebogamma, Flebogamma DIF, GamaSTAN, GamaSTAN S/D, Gamimune N, Gammagard, Gammagard S/D, Gammaked, Gammaplex, Gammar-P IV, Gamunex, Gamunex-C, Hizentra, Iveegam, Iveegam EN, Octagam, Panglobulin, Panglobulin NF, panzyga, Polygam S/D, Privigen, Sandoglobulin, Venoglobulin-S, Vigam, Vivaglobulin, Xembify What should I tell my health care provider before I take this medicine? They need to know if you have any of these conditions:  diabetes  extremely low or no immune antibodies in the blood  heart disease  history of blood clots  hyperprolinemia  infection in the blood, sepsis  kidney disease  recently received or scheduled to receive a vaccination  an unusual or allergic reaction to human immune globulin, albumin, maltose, sucrose, other medicines, foods, dyes, or preservatives  pregnant or trying to get pregnant  breast-feeding How should I use this medicine? This medicine is for injection into a muscle or infusion into a vein or skin. It is usually given by a health care professional in a hospital or clinic setting. In rare cases, some brands of this medicine might be given at home. You will be taught how to give this medicine. Use exactly as directed. Take your medicine at regular intervals. Do not take your medicine more often than directed. Talk to your pediatrician regarding the use of this medicine in children. While this drug may be prescribed for selected  conditions, precautions do apply. Overdosage: If you think you have taken too much of this medicine contact a poison control center or emergency room at once. NOTE: This medicine is only for you. Do not share this medicine with others. What if I miss a dose? It is important not to miss your dose. Call your doctor or health care professional if you are unable to keep an appointment. If you give yourself the medicine and you miss a dose, take it as soon as you can. If it is almost time for your next dose, take only that dose. Do not take double or extra doses. What may interact with this medicine?  aspirin and aspirin-like medicines  cisplatin  cyclosporine  medicines for infection like acyclovir, adefovir, amphotericin B, bacitracin, cidofovir, foscarnet, ganciclovir, gentamicin, pentamidine, vancomycin  NSAIDS, medicines for pain and inflammation, like ibuprofen or naproxen  pamidronate  vaccines  zoledronic acid This list may not describe all possible interactions. Give your health care provider a list of all the medicines, herbs, non-prescription drugs, or dietary supplements you use. Also tell them if you smoke, drink alcohol, or use illegal drugs. Some items may interact with your medicine. What should I watch for while using this medicine? Your condition will be monitored carefully while you are receiving this medicine. This medicine is made from pooled blood donations of many different people. It may be possible to pass an infection in this medicine. However, the donors are screened for infections and all products are tested for HIV and hepatitis. The medicine is treated to kill most or all bacteria and viruses. Talk to your doctor about the risks and benefits of this medicine. Do not have vaccinations for at least   14 days before, or until at least 3 months after receiving this medicine. What side effects may I notice from receiving this medicine? Side effects that you should report  to your doctor or health care professional as soon as possible:  allergic reactions like skin rash, itching or hives, swelling of the face, lips, or tongue  blue colored lips or skin  breathing problems  chest pain or tightness  fever  signs and symptoms of aseptic meningitis such as stiff neck; sensitivity to light; headache; drowsiness; fever; nausea; vomiting; rash  signs and symptoms of a blood clot such as chest pain; shortness of breath; pain, swelling, or warmth in the leg  signs and symptoms of hemolytic anemia such as fast heartbeat; tiredness; dark yellow or brown urine; or yellowing of the eyes or skin  signs and symptoms of kidney injury like trouble passing urine or change in the amount of urine  sudden weight gain  swelling of the ankles, feet, hands Side effects that usually do not require medical attention (report to your doctor or health care professional if they continue or are bothersome):  diarrhea  flushing  headache  increased sweating  joint pain  muscle cramps  muscle pain  nausea  pain, redness, or irritation at site where injected  tiredness This list may not describe all possible side effects. Call your doctor for medical advice about side effects. You may report side effects to FDA at 1-800-FDA-1088. Where should I keep my medicine? Keep out of the reach of children. This drug is usually given in a hospital or clinic and will not be stored at home. In rare cases, some brands of this medicine may be given at home. If you are using this medicine at home, you will be instructed on how to store this medicine. Throw away any unused medicine after the expiration date on the label. NOTE: This sheet is a summary. It may not cover all possible information. If you have questions about this medicine, talk to your doctor, pharmacist, or health care provider.  2021 Elsevier/Gold Standard (2018-10-09 12:51:14)  

## 2021-03-11 LAB — IGG, IGA, IGM
IgA: 11 mg/dL — ABNORMAL LOW (ref 87–352)
IgG (Immunoglobin G), Serum: 599 mg/dL (ref 586–1602)
IgM (Immunoglobulin M), Srm: 75 mg/dL (ref 26–217)

## 2021-03-11 LAB — KAPPA/LAMBDA LIGHT CHAINS
Kappa free light chain: 8 mg/L (ref 3.3–19.4)
Kappa, lambda light chain ratio: 3.08 — ABNORMAL HIGH (ref 0.26–1.65)
Lambda free light chains: 2.6 mg/L — ABNORMAL LOW (ref 5.7–26.3)

## 2021-03-15 ENCOUNTER — Other Ambulatory Visit (HOSPITAL_COMMUNITY): Payer: Self-pay

## 2021-03-15 LAB — PROTEIN ELECTROPHORESIS, SERUM, WITH REFLEX
A/G Ratio: 1.6 (ref 0.7–1.7)
Albumin ELP: 3.7 g/dL (ref 2.9–4.4)
Alpha-1-Globulin: 0.2 g/dL (ref 0.0–0.4)
Alpha-2-Globulin: 0.6 g/dL (ref 0.4–1.0)
Beta Globulin: 0.9 g/dL (ref 0.7–1.3)
Gamma Globulin: 0.6 g/dL (ref 0.4–1.8)
Globulin, Total: 2.3 g/dL (ref 2.2–3.9)
Total Protein ELP: 6 g/dL (ref 6.0–8.5)

## 2021-04-05 ENCOUNTER — Other Ambulatory Visit (HOSPITAL_COMMUNITY): Payer: Self-pay

## 2021-04-05 MED FILL — Ibrutinib Tab 420 MG: ORAL | 28 days supply | Qty: 28 | Fill #10 | Status: AC

## 2021-04-11 ENCOUNTER — Other Ambulatory Visit (HOSPITAL_COMMUNITY): Payer: Self-pay

## 2021-04-21 ENCOUNTER — Encounter: Payer: Self-pay | Admitting: Hematology & Oncology

## 2021-04-21 ENCOUNTER — Other Ambulatory Visit: Payer: Self-pay

## 2021-04-21 ENCOUNTER — Inpatient Hospital Stay: Payer: Federal, State, Local not specified - PPO

## 2021-04-21 ENCOUNTER — Telehealth: Payer: Self-pay | Admitting: *Deleted

## 2021-04-21 ENCOUNTER — Inpatient Hospital Stay: Payer: Federal, State, Local not specified - PPO | Attending: Hematology & Oncology

## 2021-04-21 ENCOUNTER — Inpatient Hospital Stay: Payer: Federal, State, Local not specified - PPO | Admitting: Hematology & Oncology

## 2021-04-21 VITALS — BP 123/65 | HR 90 | Temp 97.5°F | Resp 18 | Ht 60.0 in | Wt 111.8 lb

## 2021-04-21 VITALS — BP 123/60 | HR 77

## 2021-04-21 DIAGNOSIS — C880 Waldenstrom macroglobulinemia: Secondary | ICD-10-CM

## 2021-04-21 DIAGNOSIS — D510 Vitamin B12 deficiency anemia due to intrinsic factor deficiency: Secondary | ICD-10-CM | POA: Diagnosis present

## 2021-04-21 DIAGNOSIS — J0101 Acute recurrent maxillary sinusitis: Secondary | ICD-10-CM

## 2021-04-21 DIAGNOSIS — D801 Nonfamilial hypogammaglobulinemia: Secondary | ICD-10-CM | POA: Insufficient documentation

## 2021-04-21 DIAGNOSIS — J329 Chronic sinusitis, unspecified: Secondary | ICD-10-CM | POA: Diagnosis not present

## 2021-04-21 LAB — CBC WITH DIFFERENTIAL (CANCER CENTER ONLY)
Abs Immature Granulocytes: 0.12 10*3/uL — ABNORMAL HIGH (ref 0.00–0.07)
Basophils Absolute: 0 10*3/uL (ref 0.0–0.1)
Basophils Relative: 1 %
Eosinophils Absolute: 0.1 10*3/uL (ref 0.0–0.5)
Eosinophils Relative: 1 %
HCT: 43.3 % (ref 36.0–46.0)
Hemoglobin: 14.7 g/dL (ref 12.0–15.0)
Immature Granulocytes: 1 %
Lymphocytes Relative: 26 %
Lymphs Abs: 2.2 10*3/uL (ref 0.7–4.0)
MCH: 35.2 pg — ABNORMAL HIGH (ref 26.0–34.0)
MCHC: 33.9 g/dL (ref 30.0–36.0)
MCV: 103.6 fL — ABNORMAL HIGH (ref 80.0–100.0)
Monocytes Absolute: 0.7 10*3/uL (ref 0.1–1.0)
Monocytes Relative: 8 %
Neutro Abs: 5.3 10*3/uL (ref 1.7–7.7)
Neutrophils Relative %: 63 %
Platelet Count: 131 10*3/uL — ABNORMAL LOW (ref 150–400)
RBC: 4.18 MIL/uL (ref 3.87–5.11)
RDW: 12.4 % (ref 11.5–15.5)
WBC Count: 8.4 10*3/uL (ref 4.0–10.5)
nRBC: 0 % (ref 0.0–0.2)

## 2021-04-21 LAB — CMP (CANCER CENTER ONLY)
ALT: 14 U/L (ref 0–44)
AST: 22 U/L (ref 15–41)
Albumin: 4.5 g/dL (ref 3.5–5.0)
Alkaline Phosphatase: 71 U/L (ref 38–126)
Anion gap: 7 (ref 5–15)
BUN: 13 mg/dL (ref 8–23)
CO2: 30 mmol/L (ref 22–32)
Calcium: 9.6 mg/dL (ref 8.9–10.3)
Chloride: 103 mmol/L (ref 98–111)
Creatinine: 0.81 mg/dL (ref 0.44–1.00)
GFR, Estimated: >60 mL/min (ref >60)
Glucose, Bld: 89 mg/dL (ref 70–99)
Potassium: 4 mmol/L (ref 3.5–5.1)
Sodium: 140 mmol/L (ref 135–145)
Total Bilirubin: 1.3 mg/dL — ABNORMAL HIGH (ref 0.3–1.2)
Total Protein: 6.4 g/dL — ABNORMAL LOW (ref 6.5–8.1)

## 2021-04-21 LAB — VITAMIN B12: Vitamin B-12: 266 pg/mL (ref 180–914)

## 2021-04-21 MED ORDER — IMMUNE GLOBULIN (HUMAN) 20 GM/200ML IV SOLN
40.0000 g | Freq: Once | INTRAVENOUS | Status: AC
  Administered 2021-04-21: 40 g via INTRAVENOUS
  Filled 2021-04-21: qty 400

## 2021-04-21 MED ORDER — DIPHENHYDRAMINE HCL 25 MG PO CAPS
25.0000 mg | ORAL_CAPSULE | Freq: Once | ORAL | Status: AC
  Administered 2021-04-21: 25 mg via ORAL
  Filled 2021-04-21: qty 1

## 2021-04-21 MED ORDER — CYANOCOBALAMIN 1000 MCG/ML IJ SOLN
1000.0000 ug | Freq: Once | INTRAMUSCULAR | Status: AC
  Administered 2021-04-21: 1000 ug via INTRAMUSCULAR
  Filled 2021-04-21: qty 1

## 2021-04-21 MED ORDER — ACETAMINOPHEN 325 MG PO TABS
650.0000 mg | ORAL_TABLET | Freq: Once | ORAL | Status: AC
  Administered 2021-04-21: 650 mg via ORAL
  Filled 2021-04-21: qty 2

## 2021-04-21 MED ORDER — DEXTROSE 5 % IV SOLN: INTRAVENOUS | Status: DC

## 2021-04-21 NOTE — Progress Notes (Signed)
Hematology and Oncology Follow Up Visit  Kristy [name] [id number] Oct 27, 1955 66 y.o. 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  We last saw Kristy [name] for Christmas.  She had a wonderful Christmas with her family.  She and her husband might go [place] to the mountains this weekend.  I am sure the weather will be nice and cold up there.  She has had no problems with her sinuses.  The temperature changes do tend to cause little bit of discomfort on occasion.  She has had no problems with the Imbruvica.  She has had no diarrhea.  She has had no rashes.  There is been no leg swelling.  She has had no nausea or vomiting.  Her last IgM level back in December was 75 mg/dL.  There is no monoclonal spike found in her blood.  Currently, her performance status is ECOG 0.   Medications:  Allergies as of 04/21/2021       Reactions   Ibuprofen Other (See Comments)   Can not take due to medication        Medication List        Accurate as of April 21, 2021  8:17 AM. If you have any questions, ask your nurse or doctor.          STOP taking these medications    mupirocin ointment 2 % Commonly known as: BACTROBAN Stopped by: [name], MD       TAKE these medications    amLODipine 5 MG tablet Commonly known as: NORVASC Take 5 mg by mouth daily.   Cholecalciferol 25 MCG (1000 UT) tablet Take by mouth daily.   cyanocobalamin 1000 MCG/ML injection Commonly known as: (VITAMIN B-12) INJECT 1 ML INTO THE MUSCLE EVERY 2 MONTHS   estradiol 0.1 MG/GM vaginal cream Commonly known as: ESTRACE Place 1 Applicatorful vaginally 3 (three) times a week.   Imbruvica 420 MG Tabs Generic drug:  ibrutinib TAKE 1 TABLET BY MOUTH DAILY AFTER BREAKFAST.   Lifitegrast 5 % Soln Apply 1 drop to eye 2 (two) times daily.   nitrofurantoin (macrocrystal-monohydrate) 100 MG capsule Commonly known as: MACROBID 100 mg. As needed        Allergies:  Allergies  Allergen Reactions   Ibuprofen Other (See Comments)    Can not take due to medication    Past Medical History, Surgical history, Social history, and Family History were reviewed and updated.  Review of Systems: Review of Systems  Constitutional: Negative.   HENT: Negative.    Eyes: Negative.   Respiratory: Negative.    Cardiovascular: Negative.   Gastrointestinal: Negative.   Genitourinary: Negative.   Musculoskeletal: Negative.   Skin: Negative.   Neurological: Negative.   Endo/Heme/Allergies: Negative.   Psychiatric/Behavioral:  The patient is nervous/anxious.      Physical Exam:  height is 5' (1.524 m) and weight is 111 lb 12.8 oz (50.7 kg). Her oral temperature is 97.5 F (36.4 C) (abnormal). Her blood pressure is 123/65 and her pulse is 90. Her respiration is 18 and oxygen saturation is 98%.   Wt Readings from Last 3 Encounters:  04/21/21 111 lb 12.8 oz (50.7 kg)  03/10/21 111 lb 1.3 oz (50.4 kg)  01/27/21 111 lb 8 oz (50.6 kg)    Physical Exam Vitals reviewed.  HENT:     Head: Normocephalic and atraumatic.  Eyes:     Pupils: Pupils are equal, round, and reactive to light.  Cardiovascular:     Rate and Rhythm: Normal rate and regular rhythm.     Heart sounds: Normal heart sounds.  Pulmonary:     Effort: Pulmonary effort is normal.     Breath sounds: Normal breath sounds.  Abdominal:     General: Bowel sounds are normal.     Palpations: Abdomen is soft.  Musculoskeletal:        General: No tenderness or deformity. Normal range of motion.     Cervical back: Normal range of motion.  Lymphadenopathy:     Cervical: No cervical adenopathy.  Skin:    General: Skin is warm and dry.     Findings: No  erythema or rash.  Neurological:     Mental Status: She is alert and oriented to person, place, and time.  Psychiatric:        Behavior: Behavior normal.        Thought Content: Thought content normal.        Judgment: Judgment normal.     Lab Results  Component Value Date   WBC 8.4 04/21/2021   HGB 14.7 04/21/2021   HCT 43.3 04/21/2021   MCV 103.6 (H) 04/21/2021   PLT 131 (L) 04/21/2021   No results found for: FERRITIN, IRON, TIBC, UIBC, IRONPCTSAT Lab Results  Component Value Date   RETICCTPCT 2.9 (H) 10/30/2014   RBC 4.18 04/21/2021   RETICCTABS 98.3 10/30/2014   Lab Results  Component Value Date   KPAFRELGTCHN 8.0 03/10/2021   LAMBDASER 2.6 (L) 03/10/2021   KAPLAMBRATIO 3.08 (H) 03/10/2021   Lab Results  Component Value Date   IGGSERUM 599 03/10/2021   IGA 11 (L) 03/10/2021   IGMSERUM 75 03/10/2021   Lab Results  Component Value Date   TOTALPROTELP 6.0 03/10/2021   ALBUMINELP 3.7 03/10/2021   A1GS 0.2 03/10/2021   A2GS 0.6 03/10/2021   BETS 0.9 03/10/2021   BETA2SER 0.2 02/15/2015   GAMS 0.6 03/10/2021   MSPIKE Not Observed 03/10/2021   SPEI Comment 09/23/2020     Chemistry      Component Value Date/Time   NA 141 03/10/2021 0838   NA 147 (H) 03/19/2017 0849   NA 139 03/16/2016 1337   K 3.6 03/10/2021 0838   K 4.5 03/19/2017 0849   K 3.8 03/16/2016 1337   CL 104 03/10/2021 0838   CL 108 03/19/2017 0849   CO2 28 03/10/2021 0838   CO2 31 03/19/2017 0849   CO2 27 03/16/2016 1337   BUN 14 03/10/2021 0838   BUN 11 03/19/2017 0849   BUN 5.2 (L) 03/16/2016 1337   CREATININE 0.79 03/10/2021 0838   CREATININE 1.0 03/19/2017 0849   CREATININE 0.7 03/16/2016 1337      Component Value Date/Time   CALCIUM 9.3 03/10/2021 0838   CALCIUM 9.6 03/19/2017 0849   CALCIUM 8.8 03/16/2016 1337   ALKPHOS 56 03/10/2021 0838   ALKPHOS 53 03/19/2017 0849   ALKPHOS 62 03/16/2016 1337   AST 21 03/10/2021 0838   AST 24 03/16/2016 1337   ALT 14 03/10/2021 0838    ALT 23 03/19/2017 0849   ALT 19 03/16/2016 1337   BILITOT 1.3 (H) 03/10/2021 0838   BILITOT 0.57 03/16/2016 1337       Impression and Plan: [name] is a very pleasant 66 yo caucasian female with Waldenstrom's with acquired hypogammaglobulinemia and recurrent sinusitis.  [name] is doing quite well for her.  I am so happy that the Waldenstrom's is responding well.   We will proceed with IVIG today as planned.   I am just glad that she is doing so nicely.  She has had really no problems with infections.  We will still plan to get her back in 6 weeks.     [name], MD 2/2/20238:17 AM

## 2021-04-21 NOTE — Patient Instructions (Addendum)
Vitamin B12 Deficiency Vitamin B12 deficiency means that your body does not have enough vitamin B12. The body needs this important vitamin: To make red blood cells. To make genes (DNA). To help the nerves work. If you do not have enough vitamin B12 in your body, you can have health problems, such as not having enough red blood cells in the blood (anemia). What are the causes? Not eating enough foods that contain vitamin B12. Not being able to take in (absorb) vitamin B12 from the food that you eat. Certain diseases. A condition in which the body does not make enough of a certain protein. This results in your body not taking in enough vitamin B12. Having a surgery in which part of the stomach or small intestine is taken out. Taking medicines that make it hard for the body to take in vitamin B12. These include: Heartburn medicines. Some medicines that are used to treat diabetes. What increases the risk? Being an older adult. Eating a vegetarian or vegan diet that does not include any foods that come from animals. Not eating enough foods that contain vitamin B12 while you are pregnant. Taking certain medicines. Having alcoholism. What are the signs or symptoms? In some cases, there are no symptoms. If the condition leads to too few blood cells or nerve damage, symptoms can occur, such as: Feeling weak or tired. Not being hungry. Losing feeling (numbness) or tingling in your hands and feet. Redness and burning of the tongue. Feeling sad (depressed). Confusion or memory problems. Trouble walking. If anemia is very bad, symptoms can include: Being short of breath. Being dizzy. Having a very fast heartbeat. How is this treated? Changing the way you eat and drink, such as: Eating more foods that contain vitamin B12. Drinking little or no alcohol. Getting vitamin B12 shots. Taking vitamin B12 supplements by mouth (orally). Your doctor will tell you the dose that is best for you. Follow  these instructions at home: Eating and drinking  Eat foods that come from animals and have a lot of vitamin B12 in them. These include: Meats and poultry. This includes beef, pork, chicken, [place], and organ meats, such as liver. Seafood, such as clams, rainbow trout, salmon, tuna, and haddock. Eggs. Dairy foods such as milk, yogurt, and cheese. Eat breakfast cereals that have vitamin B12 added to them (are fortified). Check the label. The items listed above may not be a complete list of foods and beverages you can eat and drink. Contact a dietitian for more information. Alcohol use Do not drink alcohol if: Your doctor tells you not to drink. You are pregnant, may be pregnant, or are planning to become pregnant. If you drink alcohol: Limit how much you have to: 0-1 drink a day for women. 0-2 drinks a day for men. Know how much alcohol is in your drink. In the [place] one drink equals one 12 oz bottle of beer (355 mL), one 5 oz glass of wine (148 mL), or one 1 oz glass of hard liquor (44 mL). General instructions Get any vitamin B12 shots if told by your doctor. Take supplements only as told by your doctor. Follow the directions. Keep all follow-up visits. Contact a doctor if: Your symptoms come back. Your symptoms get worse or do not get better with treatment. Get help right away if: You have trouble breathing. You have a very fast heartbeat. You have chest pain. You get dizzy. You faint. These symptoms may be an emergency. Get help right away. Call 911.  Do not wait to see if the symptoms will go away. Do not drive yourself to the hospital. Summary Vitamin B12 deficiency means that your body is not getting enough of the vitamin. In some cases, there are no symptoms of this condition. Treatment may include making a change in the way you eat and drink, getting shots, or taking supplements. Eat foods that have vitamin B12 in them. This information is not intended to replace advice  given to you by your health care provider. Make sure you discuss any questions you have with your health care provider. Document Revised: 10/29/2020 Document Reviewed: 10/29/2020 Elsevier Patient Education  2022 [employer]. Immune Globulin Injection What is this medication? IMMUNE GLOBULIN (im MUNE  GLOB yoo lin) helps to prevent or reduce the severity of certain infections in patients who are at risk. This medicine is collected from the pooled blood of many donors. It is used to treat immune system problems, thrombocytopenia, and Kawasaki syndrome. This medicine may be used for other purposes; ask your health care provider or pharmacist if you have questions. COMMON BRAND NAME(S): ASCENIV, Baygam, BIVIGAM, Carimune, Carimune NF, cutaquig, Cuvitru, Flebogamma, Flebogamma DIF, GamaSTAN, GamaSTAN S/D, Gamimune N, Gammagard, Gammagard S/D, Gammaked, Gammaplex, Gammar-P IV, Gamunex, Gamunex-C, Hizentra, Iveegam, Iveegam EN, Octagam, Panglobulin, Panglobulin NF, panzyga, Polygam S/D, Privigen, Sandoglobulin, Venoglobulin-S, Vigam, Vivaglobulin, Xembify What should I tell my care team before I take this medication? They need to know if you have any of these conditions: diabetes extremely low or no immune antibodies in the blood heart disease history of blood clots hyperprolinemia infection in the blood, sepsis kidney disease recently received or scheduled to receive a vaccination an unusual or allergic reaction to human immune globulin, albumin, maltose, sucrose, other medicines, foods, dyes, or preservatives pregnant or trying to get pregnant breast-feeding How should I use this medication? This medicine is for injection into a muscle or infusion into a vein or skin. It is usually given by a health care professional in a hospital or clinic setting. In rare cases, some brands of this medicine might be given at home. You will be taught how to give this medicine. Use exactly as directed. Take your  medicine at regular intervals. Do not take your medicine more often than directed. Talk to your pediatrician regarding the use of this medicine in children. While this drug may be prescribed for selected conditions, precautions do apply. Overdosage: If you think you have taken too much of this medicine contact a poison control center or emergency room at once. NOTE: This medicine is only for you. Do not share this medicine with others. What if I miss a dose? It is important not to miss your dose. Call your doctor or health care professional if you are unable to keep an appointment. If you give yourself the medicine and you miss a dose, take it as soon as you can. If it is almost time for your next dose, take only that dose. Do not take double or extra doses. What may interact with this medication? aspirin and aspirin-like medicines cisplatin cyclosporine medicines for infection like acyclovir, adefovir, amphotericin B, bacitracin, cidofovir, foscarnet, ganciclovir, gentamicin, pentamidine, vancomycin NSAIDS, medicines for pain and inflammation, like ibuprofen or naproxen pamidronate vaccines zoledronic acid This list may not describe all possible interactions. Give your health care provider a list of all the medicines, herbs, non-prescription drugs, or dietary supplements you use. Also tell them if you smoke, drink alcohol, or use illegal drugs. Some items may interact with  your medicine. What should I watch for while using this medication? Your condition will be monitored carefully while you are receiving this medicine. This medicine is made from pooled blood donations of many different people. It may be possible to pass an infection in this medicine. However, the donors are screened for infections and all products are tested for HIV and hepatitis. The medicine is treated to kill most or all bacteria and viruses. Talk to your doctor about the risks and benefits of this medicine. Do not have  vaccinations for at least 14 days before, or until at least 3 months after receiving this medicine. What side effects may I notice from receiving this medication? Side effects that you should report to your doctor or health care professional as soon as possible: allergic reactions like skin rash, itching or hives, swelling of the face, lips, or tongue blue colored lips or skin breathing problems chest pain or tightness fever signs and symptoms of aseptic meningitis such as stiff neck; sensitivity to light; headache; drowsiness; fever; nausea; vomiting; rash signs and symptoms of a blood clot such as chest pain; shortness of breath; pain, swelling, or warmth in the leg signs and symptoms of hemolytic anemia such as fast heartbeat; tiredness; dark yellow or brown urine; or yellowing of the eyes or skin signs and symptoms of kidney injury like trouble passing urine or change in the amount of urine sudden weight gain swelling of the ankles, feet, hands Side effects that usually do not require medical attention (report to your doctor or health care professional if they continue or are bothersome): diarrhea flushing headache increased sweating joint pain muscle cramps muscle pain nausea pain, redness, or irritation at site where injected tiredness This list may not describe all possible side effects. Call your doctor for medical advice about side effects. You may report side effects to FDA at 1-800-FDA-1088. Where should I keep my medication? Keep out of the reach of children. This drug is usually given in a hospital or clinic and will not be stored at home. In rare cases, some brands of this medicine may be given at home. If you are using this medicine at home, you will be instructed on how to store this medicine. Throw away any unused medicine after the expiration date on the label. NOTE: This sheet is a summary. It may not cover all possible information. If you have questions about this  medicine, talk to your doctor, pharmacist, or health care provider.  2022 Elsevier/Gold Standard (2018-10-09 00:00:00)

## 2021-04-21 NOTE — Telephone Encounter (Signed)
Per 04/21/21 los - gave upcoming appointments

## 2021-04-22 LAB — KAPPA/LAMBDA LIGHT CHAINS
Kappa free light chain: 8.4 mg/L (ref 3.3–19.4)
Kappa, lambda light chain ratio: 2.55 — ABNORMAL HIGH (ref 0.26–1.65)
Lambda free light chains: 3.3 mg/L — ABNORMAL LOW (ref 5.7–26.3)

## 2021-04-22 LAB — IGG, IGA, IGM
IgA: 14 mg/dL — ABNORMAL LOW (ref 87–352)
IgG (Immunoglobin G), Serum: 593 mg/dL (ref 586–1602)
IgM (Immunoglobulin M), Srm: 78 mg/dL (ref 26–217)

## 2021-04-25 LAB — PROTEIN ELECTROPHORESIS, SERUM, WITH REFLEX
A/G Ratio: 1.4 (ref 0.7–1.7)
Albumin ELP: 3.8 g/dL (ref 2.9–4.4)
Alpha-1-Globulin: 0.3 g/dL (ref 0.0–0.4)
Alpha-2-Globulin: 0.7 g/dL (ref 0.4–1.0)
Beta Globulin: 1 g/dL (ref 0.7–1.3)
Gamma Globulin: 0.7 g/dL (ref 0.4–1.8)
Globulin, Total: 2.7 g/dL (ref 2.2–3.9)
Total Protein ELP: 6.5 g/dL (ref 6.0–8.5)

## 2021-05-03 ENCOUNTER — Other Ambulatory Visit (HOSPITAL_COMMUNITY): Payer: Self-pay

## 2021-05-04 ENCOUNTER — Other Ambulatory Visit (HOSPITAL_COMMUNITY): Payer: Self-pay

## 2021-05-04 MED FILL — Ibrutinib Tab 420 MG: ORAL | 28 days supply | Qty: 28 | Fill #11 | Status: AC

## 2021-05-09 ENCOUNTER — Other Ambulatory Visit (HOSPITAL_COMMUNITY): Payer: Self-pay

## 2021-05-17 ENCOUNTER — Telehealth: Payer: Self-pay

## 2021-05-17 NOTE — Telephone Encounter (Signed)
Patient called stating she is getting over Covid and is still congested and not 100% feeling better, has appt with pcp tomorrow and called her pharmacy to see what they recommended until then. States her pharmacy recommended she call our office to see if dr.ennever wants her to hold the imbruvica until she is feeling better, Discussed with Dr.Ennever and verbal order for patient to stop imbruvica for 1 week. Patient understands and will call if she is not feeling better in 1 week before she restarts it. She will keep her appt with her pcp tomorrow for follow up.

## 2021-06-02 ENCOUNTER — Inpatient Hospital Stay: Payer: Federal, State, Local not specified - PPO | Attending: Hematology & Oncology

## 2021-06-02 ENCOUNTER — Inpatient Hospital Stay: Payer: Federal, State, Local not specified - PPO

## 2021-06-02 ENCOUNTER — Inpatient Hospital Stay (HOSPITAL_BASED_OUTPATIENT_CLINIC_OR_DEPARTMENT_OTHER): Payer: Federal, State, Local not specified - PPO | Admitting: Hematology & Oncology

## 2021-06-02 ENCOUNTER — Other Ambulatory Visit: Payer: Self-pay

## 2021-06-02 ENCOUNTER — Encounter: Payer: Self-pay | Admitting: Hematology & Oncology

## 2021-06-02 ENCOUNTER — Telehealth: Payer: Self-pay | Admitting: *Deleted

## 2021-06-02 VITALS — BP 150/68 | HR 83 | Temp 97.7°F | Resp 18 | Wt 113.0 lb

## 2021-06-02 VITALS — BP 117/65 | HR 74 | Resp 18

## 2021-06-02 DIAGNOSIS — C880 Waldenstrom macroglobulinemia: Secondary | ICD-10-CM

## 2021-06-02 DIAGNOSIS — D510 Vitamin B12 deficiency anemia due to intrinsic factor deficiency: Secondary | ICD-10-CM

## 2021-06-02 DIAGNOSIS — D801 Nonfamilial hypogammaglobulinemia: Secondary | ICD-10-CM | POA: Diagnosis not present

## 2021-06-02 DIAGNOSIS — Z79899 Other long term (current) drug therapy: Secondary | ICD-10-CM | POA: Insufficient documentation

## 2021-06-02 LAB — CMP (CANCER CENTER ONLY)
ALT: 21 U/L (ref 0–44)
AST: 26 U/L (ref 15–41)
Albumin: 4.6 g/dL (ref 3.5–5.0)
Alkaline Phosphatase: 76 U/L (ref 38–126)
Anion gap: 9 (ref 5–15)
BUN: 10 mg/dL (ref 8–23)
CO2: 29 mmol/L (ref 22–32)
Calcium: 9.4 mg/dL (ref 8.9–10.3)
Chloride: 103 mmol/L (ref 98–111)
Creatinine: 0.79 mg/dL (ref 0.44–1.00)
GFR, Estimated: >60 mL/min (ref >60)
Glucose, Bld: 90 mg/dL (ref 70–99)
Potassium: 4.3 mmol/L (ref 3.5–5.1)
Sodium: 141 mmol/L (ref 135–145)
Total Bilirubin: 1.4 mg/dL — ABNORMAL HIGH (ref 0.3–1.2)
Total Protein: 6.9 g/dL (ref 6.5–8.1)

## 2021-06-02 LAB — CBC WITH DIFFERENTIAL (CANCER CENTER ONLY)
Abs Immature Granulocytes: 0.09 10*3/uL — ABNORMAL HIGH (ref 0.00–0.07)
Basophils Absolute: 0 10*3/uL (ref 0.0–0.1)
Basophils Relative: 1 %
Eosinophils Absolute: 0.1 10*3/uL (ref 0.0–0.5)
Eosinophils Relative: 1 %
HCT: 42 % (ref 36.0–46.0)
Hemoglobin: 14.2 g/dL (ref 12.0–15.0)
Immature Granulocytes: 1 %
Lymphocytes Relative: 28 %
Lymphs Abs: 1.9 10*3/uL (ref 0.7–4.0)
MCH: 34.9 pg — ABNORMAL HIGH (ref 26.0–34.0)
MCHC: 33.8 g/dL (ref 30.0–36.0)
MCV: 103.2 fL — ABNORMAL HIGH (ref 80.0–100.0)
Monocytes Absolute: 0.5 10*3/uL (ref 0.1–1.0)
Monocytes Relative: 7 %
Neutro Abs: 4.1 10*3/uL (ref 1.7–7.7)
Neutrophils Relative %: 62 %
Platelet Count: 121 10*3/uL — ABNORMAL LOW (ref 150–400)
RBC: 4.07 MIL/uL (ref 3.87–5.11)
RDW: 12.2 % (ref 11.5–15.5)
WBC Count: 6.7 10*3/uL (ref 4.0–10.5)
nRBC: 0 % (ref 0.0–0.2)

## 2021-06-02 LAB — LACTATE DEHYDROGENASE: LDH: 202 U/L — ABNORMAL HIGH (ref 98–192)

## 2021-06-02 LAB — VITAMIN B12: Vitamin B-12: 258 pg/mL (ref 180–914)

## 2021-06-02 MED ORDER — DEXTROSE 5 % IV SOLN: Freq: Once | INTRAVENOUS | Status: AC

## 2021-06-02 MED ORDER — IMMUNE GLOBULIN (HUMAN) 20 GM/200ML IV SOLN
40.0000 g | Freq: Once | INTRAVENOUS | Status: AC
  Administered 2021-06-02: 40 g via INTRAVENOUS
  Filled 2021-06-02: qty 400

## 2021-06-02 MED ORDER — CYANOCOBALAMIN 1000 MCG/ML IJ SOLN
1000.0000 ug | Freq: Once | INTRAMUSCULAR | Status: AC
  Administered 2021-06-02: 1000 ug via INTRAMUSCULAR
  Filled 2021-06-02: qty 1

## 2021-06-02 MED ORDER — DIPHENHYDRAMINE HCL 25 MG PO CAPS
25.0000 mg | ORAL_CAPSULE | Freq: Once | ORAL | Status: AC
  Administered 2021-06-02: 25 mg via ORAL
  Filled 2021-06-02: qty 1

## 2021-06-02 MED ORDER — ACETAMINOPHEN 325 MG PO TABS
650.0000 mg | ORAL_TABLET | Freq: Once | ORAL | Status: AC
  Administered 2021-06-02: 650 mg via ORAL
  Filled 2021-06-02: qty 2

## 2021-06-02 NOTE — Progress Notes (Signed)
?Hematology and Oncology Follow Up Visit ? ?Kristy [name] ?[id number] ?[birth year] 66 y.o. ?06/02/2021 ? ? 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 ?Current Therapy:   ?Imbruvica 420 mg PO daily ?IVIG 40 g IV infusion every 6 weeks ?Vit B12 1000 mcg IM q 6 weeks  ?  ?Interim History:  Kristy [name] is here today for follow-up and treatment.  Unfortunately, the last time that we saw her, she had COVID.  She says she had it for about a month.  She had a horrible cough.  She saw her family doctor.  She was given some antibiotics.  She was given Paxlovid but did not take this.  I told her that if she had taken the Paxlovid, the symptoms could have been a lot less. ? ?She is feeling much better now. ? ?I do not think that the Waldenstrom's or the Imbruvica had anything to do with her getting the COVID.  Her IgG level on her last saw her was 600 mg/dL.  Her IgA was on the low side of 14 mg/dL.  The IgM was normal at 78 mg/dL. ? ?She is eating well.  She is having no problems with nausea or vomiting.  Is no change in bowel or bladder habits.  She has had no rashes.  She has had no leg swelling. ? ?She is also on vitamin B-12.  Her last vitamin B12 level back in February was 266 u/L.   ? ?Overall, I would say performance status right now is ECOG 1.   ? ? ?Medications:  ?Allergies as of 06/02/2021   ? ?   Reactions  ? Ibuprofen Other (See Comments)  ? Can not take due to medication  ? ?  ? ?  ?Medication List  ?  ? ?  ? Accurate as of June 02, 2021  9:02 AM. If you have any questions, ask your nurse or doctor.  ?  ?  ? ?  ? ?amLODipine 5 MG tablet ?Commonly known as: NORVASC ?Take 5 mg by mouth daily. ?  ?Cholecalciferol 25 MCG (1000 UT) tablet ?Take by mouth daily. ?  ?cyanocobalamin 1000 MCG/ML injection ?Commonly known as: (VITAMIN B-12) ?INJECT 1 ML INTO THE MUSCLE EVERY 2 MONTHS ?  ?estradiol 0.1 MG/GM vaginal cream ?Commonly known  as: ESTRACE ?Place 1 Applicatorful vaginally 3 (three) times a week. ?  ?Imbruvica 420 MG tablet ?Generic drug: ibrutinib ?TAKE 1 TABLET BY MOUTH DAILY AFTER BREAKFAST. ?  ?Lifitegrast 5 % Soln ?Apply 1 drop to eye 2 (two) times daily. ?  ?nitrofurantoin (macrocrystal-monohydrate) 100 MG capsule ?Commonly known as: MACROBID ?100 mg. As needed ?  ? ?  ? ? ?Allergies:  ?Allergies  ?Allergen Reactions  ? Ibuprofen Other (See Comments)  ?  Can not take due to medication  ? ? ?Past Medical History, Surgical history, Social history, and Family History were reviewed and updated. ? ?Review of Systems: ?Review of Systems  ?Constitutional: Negative.   ?HENT: Negative.    ?Eyes: Negative.   ?Respiratory: Negative.    ?Cardiovascular: Negative.   ?Gastrointestinal: Negative.   ?Genitourinary: Negative.   ?Musculoskeletal: Negative.   ?Skin: Negative.   ?Neurological: Negative.   ?Endo/Heme/Allergies: Negative.   ?Psychiatric/Behavioral:  The patient is nervous/anxious.   ?  ? ?Physical Exam: ? weight is 51.3 kg. Her oral temperature is 97.7 ?F (36.5 ?C). Her blood pressure is 150/68 (abnormal) and her pulse is 83. Her respiration is 18 and oxygen saturation is  100%.  ? ?Wt Readings from Last 3 Encounters:  ?06/02/21 51.3 kg  ?04/21/21 50.7 kg  ?03/10/21 50.4 kg  ? ? ?Physical Exam ?Vitals reviewed.  ?HENT:  ?   Head: Normocephalic and atraumatic.  ?Eyes:  ?   Pupils: Pupils are equal, round, and reactive to light.  ?Cardiovascular:  ?   Rate and Rhythm: Normal rate and regular rhythm.  ?   Heart sounds: Normal heart sounds.  ?Pulmonary:  ?   Effort: Pulmonary effort is normal.  ?   Breath sounds: Normal breath sounds.  ?Abdominal:  ?   General: Bowel sounds are normal.  ?   Palpations: Abdomen is soft.  ?Musculoskeletal:     ?   General: No tenderness or deformity. Normal range of motion.  ?   Cervical back: Normal range of motion.  ?Lymphadenopathy:  ?   Cervical: No cervical adenopathy.  ?Skin: ?   General: Skin is warm and  dry.  ?   Findings: No erythema or rash.  ?Neurological:  ?   Mental Status: She is alert and oriented to person, place, and time.  ?Psychiatric:     ?   Behavior: Behavior normal.     ?   Thought Content: Thought content normal.     ?   Judgment: Judgment normal.  ? ? ? ?Lab Results  ?Component Value Date  ? WBC 6.7 06/02/2021  ? HGB 14.2 06/02/2021  ? HCT 42.0 06/02/2021  ? MCV 103.2 (H) 06/02/2021  ? PLT 121 (L) 06/02/2021  ? ?No results found for: FERRITIN, IRON, TIBC, UIBC, IRONPCTSAT ?Lab Results  ?Component Value Date  ? RETICCTPCT 2.9 (H) 10/30/2014  ? RBC 4.07 06/02/2021  ? RETICCTABS 98.3 10/30/2014  ? ?Lab Results  ?Component Value Date  ? KPAFRELGTCHN 8.4 04/21/2021  ? LAMBDASER 3.3 (L) 04/21/2021  ? KAPLAMBRATIO 2.55 (H) 04/21/2021  ? ?Lab Results  ?Component Value Date  ? IGGSERUM 593 04/21/2021  ? IGA 14 (L) 04/21/2021  ? IGMSERUM 78 04/21/2021  ? ?Lab Results  ?Component Value Date  ? TOTALPROTELP 6.5 04/21/2021  ? ALBUMINELP 3.8 04/21/2021  ? A1GS 0.3 04/21/2021  ? A2GS 0.7 04/21/2021  ? BETS 1.0 04/21/2021  ? BETA2SER 0.2 02/15/2015  ? GAMS 0.7 04/21/2021  ? MSPIKE Not Observed 04/21/2021  ? SPEI Comment 09/23/2020  ? ?  Chemistry   ?   ?Component Value Date/Time  ? NA 141 06/02/2021 0821  ? NA 147 (H) 03/19/2017 0849  ? NA 139 03/16/2016 1337  ? K 4.3 06/02/2021 0821  ? K 4.5 03/19/2017 0849  ? K 3.8 03/16/2016 1337  ? CL 103 06/02/2021 0821  ? CL 108 03/19/2017 0849  ? CO2 29 06/02/2021 0821  ? CO2 31 03/19/2017 0849  ? CO2 27 03/16/2016 1337  ? BUN 10 06/02/2021 0821  ? BUN 11 03/19/2017 0849  ? BUN 5.2 (L) 03/16/2016 1337  ? CREATININE 0.79 06/02/2021 0821  ? CREATININE 1.0 03/19/2017 0849  ? CREATININE 0.7 03/16/2016 1337  ?    ?Component Value Date/Time  ? CALCIUM 9.4 06/02/2021 0821  ? CALCIUM 9.6 03/19/2017 0849  ? CALCIUM 8.8 03/16/2016 1337  ? ALKPHOS 76 06/02/2021 0821  ? ALKPHOS 53 03/19/2017 0849  ? ALKPHOS 62 03/16/2016 1337  ? AST 26 06/02/2021 0821  ? AST 24 03/16/2016 1337  ? ALT  21 06/02/2021 0821  ? ALT 23 03/19/2017 0849  ? ALT 19 03/16/2016 1337  ? BILITOT 1.4 (H) 06/02/2021 0867  ?  BILITOT 0.57 03/16/2016 1337  ?  ? ? ? ?Impression and Plan: Ms. Rodak is a very pleasant 66 yo caucasian female with Waldenstrom's with acquired hypogammaglobulinemia and recurrent sinusitis. ? ?I feel bad that she had a COVID.  Hopefully, she will not have this again.  If she does, know she will take the Paxlovid. ? ?We will go ahead with the IVIG today.  She gets her vitamin B 12 injection also. ? ?We will still plan for follow-up in 6 weeks. ? ? ? ?[name], MD ?3/16/20239:02 AM ?

## 2021-06-02 NOTE — Telephone Encounter (Signed)
Per 06/02/21 los - gave upcoming appointments - confirmed ?

## 2021-06-02 NOTE — Patient Instructions (Signed)

## 2021-06-03 ENCOUNTER — Other Ambulatory Visit (HOSPITAL_COMMUNITY): Payer: Self-pay

## 2021-06-03 ENCOUNTER — Other Ambulatory Visit: Payer: Self-pay | Admitting: Hematology & Oncology

## 2021-06-03 LAB — KAPPA/LAMBDA LIGHT CHAINS
Kappa free light chain: 8.2 mg/L (ref 3.3–19.4)
Kappa, lambda light chain ratio: 2.41 — ABNORMAL HIGH (ref 0.26–1.65)
Lambda free light chains: 3.4 mg/L — ABNORMAL LOW (ref 5.7–26.3)

## 2021-06-03 LAB — IGG, IGA, IGM
IgA: 11 mg/dL — ABNORMAL LOW (ref 87–352)
IgG (Immunoglobin G), Serum: 600 mg/dL (ref 586–1602)
IgM (Immunoglobulin M), Srm: 127 mg/dL (ref 26–217)

## 2021-06-03 MED ORDER — IBRUTINIB 420 MG PO TABS
ORAL_TABLET | ORAL | 12 refills | Status: DC
  Filled 2021-06-10: qty 28, 28d supply, fill #0
  Filled 2021-07-05: qty 28, 28d supply, fill #1
  Filled 2021-08-02: qty 28, 28d supply, fill #2
  Filled 2021-08-25: qty 28, 28d supply, fill #3
  Filled 2021-09-27: qty 28, 28d supply, fill #4
  Filled 2021-10-21: qty 28, 28d supply, fill #5
  Filled 2021-11-24: qty 28, 28d supply, fill #6
  Filled 2021-12-22: qty 28, 28d supply, fill #7
  Filled 2022-01-16: qty 28, 28d supply, fill #8
  Filled 2022-02-17 – 2022-02-21 (×2): qty 28, 28d supply, fill #9
  Filled 2022-03-17: qty 28, 28d supply, fill #10
  Filled 2022-04-11: qty 28, 28d supply, fill #11
  Filled 2022-05-11: qty 28, 28d supply, fill #12

## 2021-06-06 ENCOUNTER — Other Ambulatory Visit (HOSPITAL_COMMUNITY): Payer: Self-pay

## 2021-06-10 ENCOUNTER — Other Ambulatory Visit (HOSPITAL_COMMUNITY): Payer: Self-pay

## 2021-07-04 ENCOUNTER — Other Ambulatory Visit (HOSPITAL_COMMUNITY): Payer: Self-pay

## 2021-07-05 ENCOUNTER — Other Ambulatory Visit (HOSPITAL_COMMUNITY): Payer: Self-pay

## 2021-07-12 ENCOUNTER — Other Ambulatory Visit (HOSPITAL_COMMUNITY): Payer: Self-pay

## 2021-07-14 ENCOUNTER — Telehealth: Payer: Self-pay | Admitting: *Deleted

## 2021-07-14 ENCOUNTER — Inpatient Hospital Stay: Payer: Federal, State, Local not specified - PPO

## 2021-07-14 ENCOUNTER — Encounter: Payer: Self-pay | Admitting: Hematology & Oncology

## 2021-07-14 ENCOUNTER — Inpatient Hospital Stay: Payer: Federal, State, Local not specified - PPO | Admitting: Hematology & Oncology

## 2021-07-14 ENCOUNTER — Other Ambulatory Visit: Payer: Self-pay

## 2021-07-14 ENCOUNTER — Inpatient Hospital Stay: Payer: Federal, State, Local not specified - PPO | Attending: Hematology & Oncology

## 2021-07-14 VITALS — BP 139/73 | HR 80 | Temp 97.6°F | Resp 18 | Ht 60.0 in | Wt 112.0 lb

## 2021-07-14 VITALS — BP 139/72 | HR 85 | Temp 97.6°F | Resp 17

## 2021-07-14 DIAGNOSIS — C880 Waldenstrom macroglobulinemia: Secondary | ICD-10-CM

## 2021-07-14 DIAGNOSIS — D510 Vitamin B12 deficiency anemia due to intrinsic factor deficiency: Secondary | ICD-10-CM

## 2021-07-14 DIAGNOSIS — Z79899 Other long term (current) drug therapy: Secondary | ICD-10-CM | POA: Insufficient documentation

## 2021-07-14 DIAGNOSIS — J0101 Acute recurrent maxillary sinusitis: Secondary | ICD-10-CM

## 2021-07-14 DIAGNOSIS — D801 Nonfamilial hypogammaglobulinemia: Secondary | ICD-10-CM | POA: Diagnosis not present

## 2021-07-14 LAB — CBC WITH DIFFERENTIAL (CANCER CENTER ONLY)
Abs Immature Granulocytes: 0.17 10*3/uL — ABNORMAL HIGH (ref 0.00–0.07)
Basophils Absolute: 0.1 10*3/uL (ref 0.0–0.1)
Basophils Relative: 1 %
Eosinophils Absolute: 0.1 10*3/uL (ref 0.0–0.5)
Eosinophils Relative: 1 %
HCT: 43.8 % (ref 36.0–46.0)
Hemoglobin: 14.7 g/dL (ref 12.0–15.0)
Immature Granulocytes: 2 %
Lymphocytes Relative: 29 %
Lymphs Abs: 2.5 10*3/uL (ref 0.7–4.0)
MCH: 34.7 pg — ABNORMAL HIGH (ref 26.0–34.0)
MCHC: 33.6 g/dL (ref 30.0–36.0)
MCV: 103.3 fL — ABNORMAL HIGH (ref 80.0–100.0)
Monocytes Absolute: 0.5 10*3/uL (ref 0.1–1.0)
Monocytes Relative: 6 %
Neutro Abs: 5.3 10*3/uL (ref 1.7–7.7)
Neutrophils Relative %: 61 %
Platelet Count: 126 10*3/uL — ABNORMAL LOW (ref 150–400)
RBC: 4.24 MIL/uL (ref 3.87–5.11)
RDW: 12.6 % (ref 11.5–15.5)
WBC Count: 8.6 10*3/uL (ref 4.0–10.5)
nRBC: 0 % (ref 0.0–0.2)

## 2021-07-14 LAB — CMP (CANCER CENTER ONLY)
ALT: 16 U/L (ref 0–44)
AST: 24 U/L (ref 15–41)
Albumin: 4.6 g/dL (ref 3.5–5.0)
Alkaline Phosphatase: 67 U/L (ref 38–126)
Anion gap: 7 (ref 5–15)
BUN: 8 mg/dL (ref 8–23)
CO2: 29 mmol/L (ref 22–32)
Calcium: 9.3 mg/dL (ref 8.9–10.3)
Chloride: 103 mmol/L (ref 98–111)
Creatinine: 0.77 mg/dL (ref 0.44–1.00)
GFR, Estimated: >60 mL/min (ref >60)
Glucose, Bld: 80 mg/dL (ref 70–99)
Potassium: 3.9 mmol/L (ref 3.5–5.1)
Sodium: 139 mmol/L (ref 135–145)
Total Bilirubin: 1.1 mg/dL (ref 0.3–1.2)
Total Protein: 6.9 g/dL (ref 6.5–8.1)

## 2021-07-14 LAB — VITAMIN B12: Vitamin B-12: 318 pg/mL (ref 180–914)

## 2021-07-14 LAB — LACTATE DEHYDROGENASE: LDH: 215 U/L — ABNORMAL HIGH (ref 98–192)

## 2021-07-14 MED ORDER — ACETAMINOPHEN 325 MG PO TABS
650.0000 mg | ORAL_TABLET | Freq: Once | ORAL | Status: AC
  Administered 2021-07-14: 650 mg via ORAL
  Filled 2021-07-14: qty 2

## 2021-07-14 MED ORDER — DIPHENHYDRAMINE HCL 25 MG PO CAPS
25.0000 mg | ORAL_CAPSULE | Freq: Once | ORAL | Status: AC
  Administered 2021-07-14: 25 mg via ORAL
  Filled 2021-07-14: qty 1

## 2021-07-14 MED ORDER — CYANOCOBALAMIN 1000 MCG/ML IJ SOLN
1000.0000 ug | Freq: Once | INTRAMUSCULAR | Status: AC
  Administered 2021-07-14: 1000 ug via INTRAMUSCULAR
  Filled 2021-07-14: qty 1

## 2021-07-14 MED ORDER — IMMUNE GLOBULIN (HUMAN) 20 GM/200ML IV SOLN
40.0000 g | Freq: Once | INTRAVENOUS | Status: AC
  Administered 2021-07-14: 40 g via INTRAVENOUS
  Filled 2021-07-14: qty 400

## 2021-07-14 NOTE — Progress Notes (Signed)
?Hematology and Oncology Follow Up Visit ? ?Kristy [name] ?[id number] ?February 05, 1956 66 y.o. ?07/14/2021 ? ? 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 ?Current Therapy:   ?Imbruvica 420 mg PO daily ?IVIG 40 g IV infusion every 6 weeks ?Vit B12 1000 mcg IM q 6 weeks  ?  ?Interim History:  [name] is here today for follow-up and treatment.  She is doing pretty well.  She really has had no complaints.  Unfortunately, there is been some activity with her mom up in [place].  She is 4 years old.  She fell.  She has some stitches put in her head.  I know that her brother, who lives up there, is doing his best to try to help her out. ? ?She is done incredibly well with the Imbruvica and the IVIG.  She has had no problems with respect to bronchitis or sinus infections.  Last time we saw her, she had been getting over the [place]. ? ?Her last IgM level back in March was 127 mg/dL. ? ?She has had no fever.  She has had no rashes.  There is been no pruritus. ? ?Her last vitamin B-12 level in March was 258. ? ?Overall, I would say performance status is probably ECOG 1. ? ?Medications:  ?Allergies as of 07/14/2021   ? ?   Reactions  ? Ibuprofen Other (See Comments)  ? Can not take due to medication  ? ?  ? ?  ?Medication List  ?  ? ?  ? Accurate as of July 14, 2021  9:15 AM. If you have any questions, ask your nurse or doctor.  ?  ?  ? ?  ? ?amLODipine 5 MG tablet ?Commonly known as: NORVASC ?Take 5 mg by mouth daily. ?  ?Cholecalciferol 25 MCG (1000 UT) tablet ?Take by mouth daily. ?  ?cyanocobalamin 1000 MCG/ML injection ?Commonly known as: (VITAMIN B-12) ?INJECT 1 ML INTO THE MUSCLE EVERY 2 MONTHS ?  ?estradiol 0.1 MG/GM vaginal cream ?Commonly known as: ESTRACE ?Place 1 Applicatorful vaginally 3 (three) times a week. ?  ?Imbruvica 420 MG tablet ?Generic drug: ibrutinib ?TAKE 1 TABLET BY MOUTH DAILY AFTER BREAKFAST. ?  ?Lifitegrast  5 % Soln ?Apply 1 drop to eye 2 (two) times daily. ?  ?nitrofurantoin (macrocrystal-monohydrate) 100 MG capsule ?Commonly known as: MACROBID ?100 mg. As needed ?  ? ?  ? ? ?Allergies:  ?Allergies  ?Allergen Reactions  ? Ibuprofen Other (See Comments)  ?  Can not take due to medication  ? ? ?Past Medical History, Surgical history, Social history, and Family History were reviewed and updated. ? ?Review of Systems: ?Review of Systems  ?Constitutional: Negative.   ?HENT: Negative.    ?Eyes: Negative.   ?Respiratory: Negative.    ?Cardiovascular: Negative.   ?Gastrointestinal: Negative.   ?Genitourinary: Negative.   ?Musculoskeletal: Negative.   ?Skin: Negative.   ?Neurological: Negative.   ?Endo/Heme/Allergies: Negative.   ?Psychiatric/Behavioral:  The patient is nervous/anxious.   ?  ? ?Physical Exam: ? height is 5' (1.524 m) and weight is 112 lb (50.8 kg). Her oral temperature is 97.6 ?F (36.4 ?C). Her blood pressure is 139/73 and her pulse is 80. Her respiration is 18 and oxygen saturation is 97%.  ? ?Wt Readings from Last 3 Encounters:  ?07/14/21 112 lb (50.8 kg)  ?06/02/21 113 lb (51.3 kg)  ?04/21/21 111 lb 12.8 oz (50.7 kg)  ? ? ?Physical Exam ?Vitals reviewed.  ?HENT:  ?  Head: Normocephalic and atraumatic.  ?Eyes:  ?   Pupils: Pupils are equal, round, and reactive to light.  ?Cardiovascular:  ?   Rate and Rhythm: Normal rate and regular rhythm.  ?   Heart sounds: Normal heart sounds.  ?Pulmonary:  ?   Effort: Pulmonary effort is normal.  ?   Breath sounds: Normal breath sounds.  ?Abdominal:  ?   General: Bowel sounds are normal.  ?   Palpations: Abdomen is soft.  ?Musculoskeletal:     ?   General: No tenderness or deformity. Normal range of motion.  ?   Cervical back: Normal range of motion.  ?Lymphadenopathy:  ?   Cervical: No cervical adenopathy.  ?Skin: ?   General: Skin is warm and dry.  ?   Findings: No erythema or rash.  ?Neurological:  ?   Mental Status: She is alert and oriented to person, place, and  time.  ?Psychiatric:     ?   Behavior: Behavior normal.     ?   Thought Content: Thought content normal.     ?   Judgment: Judgment normal.  ? ? ? ?Lab Results  ?Component Value Date  ? WBC 8.6 07/14/2021  ? HGB 14.7 07/14/2021  ? HCT 43.8 07/14/2021  ? MCV 103.3 (H) 07/14/2021  ? PLT 126 (L) 07/14/2021  ? ?No results found for: FERRITIN, IRON, TIBC, UIBC, IRONPCTSAT ?Lab Results  ?Component Value Date  ? RETICCTPCT 2.9 (H) 10/30/2014  ? RBC 4.24 07/14/2021  ? RETICCTABS 98.3 10/30/2014  ? ?Lab Results  ?Component Value Date  ? KPAFRELGTCHN 8.2 06/02/2021  ? LAMBDASER 3.4 (L) 06/02/2021  ? KAPLAMBRATIO 2.41 (H) 06/02/2021  ? ?Lab Results  ?Component Value Date  ? IGGSERUM 600 06/02/2021  ? IGA 11 (L) 06/02/2021  ? IGMSERUM 127 06/02/2021  ? ?Lab Results  ?Component Value Date  ? TOTALPROTELP 6.5 04/21/2021  ? ALBUMINELP 3.8 04/21/2021  ? A1GS 0.3 04/21/2021  ? A2GS 0.7 04/21/2021  ? BETS 1.0 04/21/2021  ? BETA2SER 0.2 02/15/2015  ? GAMS 0.7 04/21/2021  ? MSPIKE Not Observed 04/21/2021  ? SPEI Comment 09/23/2020  ? ?  Chemistry   ?   ?Component Value Date/Time  ? NA 139 07/14/2021 0824  ? NA 147 (H) 03/19/2017 0849  ? NA 139 03/16/2016 1337  ? K 3.9 07/14/2021 0824  ? K 4.5 03/19/2017 0849  ? K 3.8 03/16/2016 1337  ? CL 103 07/14/2021 0824  ? CL 108 03/19/2017 0849  ? CO2 29 07/14/2021 0824  ? CO2 31 03/19/2017 0849  ? CO2 27 03/16/2016 1337  ? BUN 8 07/14/2021 0824  ? BUN 11 03/19/2017 0849  ? BUN 5.2 (L) 03/16/2016 1337  ? CREATININE 0.77 07/14/2021 0824  ? CREATININE 1.0 03/19/2017 0849  ? CREATININE 0.7 03/16/2016 1337  ?    ?Component Value Date/Time  ? CALCIUM 9.3 07/14/2021 0824  ? CALCIUM 9.6 03/19/2017 0849  ? CALCIUM 8.8 03/16/2016 1337  ? ALKPHOS 67 07/14/2021 0824  ? ALKPHOS 53 03/19/2017 0849  ? ALKPHOS 62 03/16/2016 1337  ? AST 24 07/14/2021 0824  ? AST 24 03/16/2016 1337  ? ALT 16 07/14/2021 0824  ? ALT 23 03/19/2017 0849  ? ALT 19 03/16/2016 1337  ? BILITOT 1.1 07/14/2021 0824  ? BILITOT 0.57  03/16/2016 1337  ?  ? ? ? ?Impression and Plan: Ms. Windle is a very pleasant 66 yo caucasian female with Waldenstrom's with acquired hypogammaglobulinemia and recurrent sinusitis. ? ?She is doing  well.  The Waldenstrom's has not been a problem for her.  Her immunoglobulins have been held up with the IVIG. ? ?She is on a good schedule.  She is on a good interval for the IVIG and vitamin B12. ? ?We will plan to get her back in 6 weeks, which will be in June now. ? ? ? ? ?[name], MD ?4/27/20239:15 AM ?

## 2021-07-14 NOTE — Telephone Encounter (Signed)
Per 07/14/21 los - gave upcoming appointments - confirmed ?

## 2021-07-14 NOTE — Progress Notes (Signed)
Pt declined to stay for post infusion observation period. Pt stated she has tolerated medication multiple times prior without difficulty. Pt aware to call clinic with any questions or concerns. Pt verbalized understanding and had no further questions.  ? ?

## 2021-07-14 NOTE — Patient Instructions (Signed)
Immune Globulin Injection What is this medicine? IMMUNE GLOBULIN (im MUNE GLOB yoo lin) helps to prevent or reduce the severity of certain infections in patients who are at risk. This medicine is collected from the pooled blood of many donors. It is used to treat immune system problems, thrombocytopenia, and Kawasaki syndrome. This medicine may be used for other purposes; ask your health care provider or pharmacist if you have questions. COMMON BRAND NAME(S): ASCENIV, Baygam, BIVIGAM, Carimune, Carimune NF, cutaquig, Cuvitru, Flebogamma, Flebogamma DIF, GamaSTAN, GamaSTAN S/D, Gamimune N, Gammagard, Gammagard S/D, Gammaked, Gammaplex, Gammar-P IV, Gamunex, Gamunex-C, Hizentra, Iveegam, Iveegam EN, Octagam, Panglobulin, Panglobulin NF, panzyga, Polygam S/D, Privigen, Sandoglobulin, Venoglobulin-S, Vigam, Vivaglobulin, Xembify What should I tell my health care provider before I take this medicine? They need to know if you have any of these conditions:  diabetes  extremely low or no immune antibodies in the blood  heart disease  history of blood clots  hyperprolinemia  infection in the blood, sepsis  kidney disease  recently received or scheduled to receive a vaccination  an unusual or allergic reaction to human immune globulin, albumin, maltose, sucrose, other medicines, foods, dyes, or preservatives  pregnant or trying to get pregnant  breast-feeding How should I use this medicine? This medicine is for injection into a muscle or infusion into a vein or skin. It is usually given by a health care professional in a hospital or clinic setting. In rare cases, some brands of this medicine might be given at home. You will be taught how to give this medicine. Use exactly as directed. Take your medicine at regular intervals. Do not take your medicine more often than directed. Talk to your pediatrician regarding the use of this medicine in children. While this drug may be prescribed for selected  conditions, precautions do apply. Overdosage: If you think you have taken too much of this medicine contact a poison control center or emergency room at once. NOTE: This medicine is only for you. Do not share this medicine with others. What if I miss a dose? It is important not to miss your dose. Call your doctor or health care professional if you are unable to keep an appointment. If you give yourself the medicine and you miss a dose, take it as soon as you can. If it is almost time for your next dose, take only that dose. Do not take double or extra doses. What may interact with this medicine?  aspirin and aspirin-like medicines  cisplatin  cyclosporine  medicines for infection like acyclovir, adefovir, amphotericin B, bacitracin, cidofovir, foscarnet, ganciclovir, gentamicin, pentamidine, vancomycin  NSAIDS, medicines for pain and inflammation, like ibuprofen or naproxen  pamidronate  vaccines  zoledronic acid This list may not describe all possible interactions. Give your health care provider a list of all the medicines, herbs, non-prescription drugs, or dietary supplements you use. Also tell them if you smoke, drink alcohol, or use illegal drugs. Some items may interact with your medicine. What should I watch for while using this medicine? Your condition will be monitored carefully while you are receiving this medicine. This medicine is made from pooled blood donations of many different people. It may be possible to pass an infection in this medicine. However, the donors are screened for infections and all products are tested for HIV and hepatitis. The medicine is treated to kill most or all bacteria and viruses. Talk to your doctor about the risks and benefits of this medicine. Do not have vaccinations for at least   14 days before, or until at least 3 months after receiving this medicine. What side effects may I notice from receiving this medicine? Side effects that you should report  to your doctor or health care professional as soon as possible:  allergic reactions like skin rash, itching or hives, swelling of the face, lips, or tongue  blue colored lips or skin  breathing problems  chest pain or tightness  fever  signs and symptoms of aseptic meningitis such as stiff neck; sensitivity to light; headache; drowsiness; fever; nausea; vomiting; rash  signs and symptoms of a blood clot such as chest pain; shortness of breath; pain, swelling, or warmth in the leg  signs and symptoms of hemolytic anemia such as fast heartbeat; tiredness; dark yellow or brown urine; or yellowing of the eyes or skin  signs and symptoms of kidney injury like trouble passing urine or change in the amount of urine  sudden weight gain  swelling of the ankles, feet, hands Side effects that usually do not require medical attention (report to your doctor or health care professional if they continue or are bothersome):  diarrhea  flushing  headache  increased sweating  joint pain  muscle cramps  muscle pain  nausea  pain, redness, or irritation at site where injected  tiredness This list may not describe all possible side effects. Call your doctor for medical advice about side effects. You may report side effects to FDA at 1-800-FDA-1088. Where should I keep my medicine? Keep out of the reach of children. This drug is usually given in a hospital or clinic and will not be stored at home. In rare cases, some brands of this medicine may be given at home. If you are using this medicine at home, you will be instructed on how to store this medicine. Throw away any unused medicine after the expiration date on the label. NOTE: This sheet is a summary. It may not cover all possible information. If you have questions about this medicine, talk to your doctor, pharmacist, or health care provider.  2021 Elsevier/Gold Standard (2018-10-09 12:51:14)  

## 2021-07-15 LAB — IGG, IGA, IGM
IgA: 12 mg/dL — ABNORMAL LOW (ref 87–352)
IgG (Immunoglobin G), Serum: 630 mg/dL (ref 586–1602)
IgM (Immunoglobulin M), Srm: 107 mg/dL (ref 26–217)

## 2021-07-15 LAB — KAPPA/LAMBDA LIGHT CHAINS
Kappa free light chain: 8.3 mg/L (ref 3.3–19.4)
Kappa, lambda light chain ratio: 2.86 — ABNORMAL HIGH (ref 0.26–1.65)
Lambda free light chains: 2.9 mg/L — ABNORMAL LOW (ref 5.7–26.3)

## 2021-07-19 LAB — PROTEIN ELECTROPHORESIS, SERUM, WITH REFLEX
A/G Ratio: 1.7 (ref 0.7–1.7)
Albumin ELP: 4.1 g/dL (ref 2.9–4.4)
Alpha-1-Globulin: 0.3 g/dL (ref 0.0–0.4)
Alpha-2-Globulin: 0.6 g/dL (ref 0.4–1.0)
Beta Globulin: 0.9 g/dL (ref 0.7–1.3)
Gamma Globulin: 0.7 g/dL (ref 0.4–1.8)
Globulin, Total: 2.4 g/dL (ref 2.2–3.9)
M-Spike, %: 0.2 g/dL — ABNORMAL HIGH
SPEP Interpretation: 0
Total Protein ELP: 6.5 g/dL (ref 6.0–8.5)

## 2021-07-19 LAB — IMMUNOFIXATION REFLEX, SERUM
IgA: 12 mg/dL — ABNORMAL LOW (ref 87–352)
IgG (Immunoglobin G), Serum: 685 mg/dL (ref 586–1602)
IgM (Immunoglobulin M), Srm: 108 mg/dL (ref 26–217)

## 2021-08-02 ENCOUNTER — Other Ambulatory Visit (HOSPITAL_COMMUNITY): Payer: Self-pay

## 2021-08-08 ENCOUNTER — Other Ambulatory Visit (HOSPITAL_COMMUNITY): Payer: Self-pay

## 2021-08-25 ENCOUNTER — Inpatient Hospital Stay: Payer: Federal, State, Local not specified - PPO

## 2021-08-25 ENCOUNTER — Other Ambulatory Visit: Payer: Self-pay

## 2021-08-25 ENCOUNTER — Inpatient Hospital Stay (HOSPITAL_BASED_OUTPATIENT_CLINIC_OR_DEPARTMENT_OTHER): Payer: Federal, State, Local not specified - PPO | Admitting: Hematology & Oncology

## 2021-08-25 ENCOUNTER — Other Ambulatory Visit: Payer: Self-pay | Admitting: Oncology

## 2021-08-25 ENCOUNTER — Inpatient Hospital Stay: Payer: Federal, State, Local not specified - PPO | Attending: Hematology & Oncology

## 2021-08-25 ENCOUNTER — Encounter: Payer: Self-pay | Admitting: Hematology & Oncology

## 2021-08-25 ENCOUNTER — Other Ambulatory Visit (HOSPITAL_COMMUNITY): Payer: Self-pay

## 2021-08-25 VITALS — BP 121/72 | HR 77 | Temp 97.8°F | Resp 17

## 2021-08-25 VITALS — BP 125/66 | HR 75 | Temp 97.7°F | Resp 18 | Ht 60.0 in | Wt 109.1 lb

## 2021-08-25 DIAGNOSIS — D801 Nonfamilial hypogammaglobulinemia: Secondary | ICD-10-CM | POA: Insufficient documentation

## 2021-08-25 DIAGNOSIS — M25551 Pain in right hip: Secondary | ICD-10-CM | POA: Insufficient documentation

## 2021-08-25 DIAGNOSIS — C880 Waldenstrom macroglobulinemia: Secondary | ICD-10-CM

## 2021-08-25 DIAGNOSIS — D510 Vitamin B12 deficiency anemia due to intrinsic factor deficiency: Secondary | ICD-10-CM | POA: Insufficient documentation

## 2021-08-25 DIAGNOSIS — J329 Chronic sinusitis, unspecified: Secondary | ICD-10-CM | POA: Insufficient documentation

## 2021-08-25 DIAGNOSIS — J0101 Acute recurrent maxillary sinusitis: Secondary | ICD-10-CM

## 2021-08-25 LAB — CMP (CANCER CENTER ONLY)
ALT: 21 U/L (ref 0–44)
AST: 25 U/L (ref 15–41)
Albumin: 4.7 g/dL (ref 3.5–5.0)
Alkaline Phosphatase: 64 U/L (ref 38–126)
Anion gap: 8 (ref 5–15)
BUN: 10 mg/dL (ref 8–23)
CO2: 31 mmol/L (ref 22–32)
Calcium: 9.5 mg/dL (ref 8.9–10.3)
Chloride: 102 mmol/L (ref 98–111)
Creatinine: 0.89 mg/dL (ref 0.44–1.00)
GFR, Estimated: >60 mL/min (ref >60)
Glucose, Bld: 73 mg/dL (ref 70–99)
Potassium: 4.3 mmol/L (ref 3.5–5.1)
Sodium: 141 mmol/L (ref 135–145)
Total Bilirubin: 1.2 mg/dL (ref 0.3–1.2)
Total Protein: 6.5 g/dL (ref 6.5–8.1)

## 2021-08-25 LAB — CBC WITH DIFFERENTIAL (CANCER CENTER ONLY)
Abs Immature Granulocytes: 0.09 K/uL — ABNORMAL HIGH (ref 0.00–0.07)
Basophils Absolute: 0 K/uL (ref 0.0–0.1)
Basophils Relative: 1 %
Eosinophils Absolute: 0.1 K/uL (ref 0.0–0.5)
Eosinophils Relative: 1 %
HCT: 42.8 % (ref 36.0–46.0)
Hemoglobin: 14.3 g/dL (ref 12.0–15.0)
Immature Granulocytes: 1 %
Lymphocytes Relative: 30 %
Lymphs Abs: 2.5 K/uL (ref 0.7–4.0)
MCH: 34.6 pg — ABNORMAL HIGH (ref 26.0–34.0)
MCHC: 33.4 g/dL (ref 30.0–36.0)
MCV: 103.6 fL — ABNORMAL HIGH (ref 80.0–100.0)
Monocytes Absolute: 0.6 K/uL (ref 0.1–1.0)
Monocytes Relative: 7 %
Neutro Abs: 5 K/uL (ref 1.7–7.7)
Neutrophils Relative %: 60 %
Platelet Count: 119 K/uL — ABNORMAL LOW (ref 150–400)
RBC: 4.13 MIL/uL (ref 3.87–5.11)
RDW: 12.5 % (ref 11.5–15.5)
WBC Count: 8.2 K/uL (ref 4.0–10.5)
nRBC: 0 % (ref 0.0–0.2)

## 2021-08-25 LAB — VITAMIN B12: Vitamin B-12: 267 pg/mL (ref 180–914)

## 2021-08-25 LAB — LACTATE DEHYDROGENASE: LDH: 201 U/L — ABNORMAL HIGH (ref 98–192)

## 2021-08-25 MED ORDER — CYANOCOBALAMIN 1000 MCG/ML IJ SOLN
1000.0000 ug | Freq: Once | INTRAMUSCULAR | Status: AC
  Administered 2021-08-25: 1000 ug via INTRAMUSCULAR
  Filled 2021-08-25: qty 1

## 2021-08-25 MED ORDER — ACETAMINOPHEN 325 MG PO TABS: 650.0000 mg | ORAL_TABLET | Freq: Once | ORAL | Status: DC

## 2021-08-25 MED ORDER — DIPHENHYDRAMINE HCL 25 MG PO CAPS: 25.0000 mg | ORAL_CAPSULE | Freq: Once | ORAL | Status: DC

## 2021-08-25 MED ORDER — IMMUNE GLOBULIN (HUMAN) 20 GM/200ML IV SOLN
40.0000 g | Freq: Once | INTRAVENOUS | Status: AC
  Administered 2021-08-25: 40 g via INTRAVENOUS
  Filled 2021-08-25: qty 400

## 2021-08-25 NOTE — Patient Instructions (Signed)
Immune Globulin Injection What is this medicine? IMMUNE GLOBULIN (im MUNE GLOB yoo lin) helps to prevent or reduce the severity of certain infections in patients who are at risk. This medicine is collected from the pooled blood of many donors. It is used to treat immune system problems, thrombocytopenia, and Kawasaki syndrome. This medicine may be used for other purposes; ask your health care provider or pharmacist if you have questions. COMMON BRAND NAME(S): ASCENIV, Baygam, BIVIGAM, Carimune, Carimune NF, cutaquig, Cuvitru, Flebogamma, Flebogamma DIF, GamaSTAN, GamaSTAN S/D, Gamimune N, Gammagard, Gammagard S/D, Gammaked, Gammaplex, Gammar-P IV, Gamunex, Gamunex-C, Hizentra, Iveegam, Iveegam EN, Octagam, Panglobulin, Panglobulin NF, panzyga, Polygam S/D, Privigen, Sandoglobulin, Venoglobulin-S, Vigam, Vivaglobulin, Xembify What should I tell my health care provider before I take this medicine? They need to know if you have any of these conditions:  diabetes  extremely low or no immune antibodies in the blood  heart disease  history of blood clots  hyperprolinemia  infection in the blood, sepsis  kidney disease  recently received or scheduled to receive a vaccination  an unusual or allergic reaction to human immune globulin, albumin, maltose, sucrose, other medicines, foods, dyes, or preservatives  pregnant or trying to get pregnant  breast-feeding How should I use this medicine? This medicine is for injection into a muscle or infusion into a vein or skin. It is usually given by a health care professional in a hospital or clinic setting. In rare cases, some brands of this medicine might be given at home. You will be taught how to give this medicine. Use exactly as directed. Take your medicine at regular intervals. Do not take your medicine more often than directed. Talk to your pediatrician regarding the use of this medicine in children. While this drug may be prescribed for selected  conditions, precautions do apply. Overdosage: If you think you have taken too much of this medicine contact a poison control center or emergency room at once. NOTE: This medicine is only for you. Do not share this medicine with others. What if I miss a dose? It is important not to miss your dose. Call your doctor or health care professional if you are unable to keep an appointment. If you give yourself the medicine and you miss a dose, take it as soon as you can. If it is almost time for your next dose, take only that dose. Do not take double or extra doses. What may interact with this medicine?  aspirin and aspirin-like medicines  cisplatin  cyclosporine  medicines for infection like acyclovir, adefovir, amphotericin B, bacitracin, cidofovir, foscarnet, ganciclovir, gentamicin, pentamidine, vancomycin  NSAIDS, medicines for pain and inflammation, like ibuprofen or naproxen  pamidronate  vaccines  zoledronic acid This list may not describe all possible interactions. Give your health care provider a list of all the medicines, herbs, non-prescription drugs, or dietary supplements you use. Also tell them if you smoke, drink alcohol, or use illegal drugs. Some items may interact with your medicine. What should I watch for while using this medicine? Your condition will be monitored carefully while you are receiving this medicine. This medicine is made from pooled blood donations of many different people. It may be possible to pass an infection in this medicine. However, the donors are screened for infections and all products are tested for HIV and hepatitis. The medicine is treated to kill most or all bacteria and viruses. Talk to your doctor about the risks and benefits of this medicine. Do not have vaccinations for at least   14 days before, or until at least 3 months after receiving this medicine. What side effects may I notice from receiving this medicine? Side effects that you should report  to your doctor or health care professional as soon as possible:  allergic reactions like skin rash, itching or hives, swelling of the face, lips, or tongue  blue colored lips or skin  breathing problems  chest pain or tightness  fever  signs and symptoms of aseptic meningitis such as stiff neck; sensitivity to light; headache; drowsiness; fever; nausea; vomiting; rash  signs and symptoms of a blood clot such as chest pain; shortness of breath; pain, swelling, or warmth in the leg  signs and symptoms of hemolytic anemia such as fast heartbeat; tiredness; dark yellow or brown urine; or yellowing of the eyes or skin  signs and symptoms of kidney injury like trouble passing urine or change in the amount of urine  sudden weight gain  swelling of the ankles, feet, hands Side effects that usually do not require medical attention (report to your doctor or health care professional if they continue or are bothersome):  diarrhea  flushing  headache  increased sweating  joint pain  muscle cramps  muscle pain  nausea  pain, redness, or irritation at site where injected  tiredness This list may not describe all possible side effects. Call your doctor for medical advice about side effects. You may report side effects to FDA at 1-800-FDA-1088. Where should I keep my medicine? Keep out of the reach of children. This drug is usually given in a hospital or clinic and will not be stored at home. In rare cases, some brands of this medicine may be given at home. If you are using this medicine at home, you will be instructed on how to store this medicine. Throw away any unused medicine after the expiration date on the label. NOTE: This sheet is a summary. It may not cover all possible information. If you have questions about this medicine, talk to your doctor, pharmacist, or health care provider.  2021 Elsevier/Gold Standard (2018-10-09 12:51:14)  

## 2021-08-25 NOTE — Progress Notes (Signed)
Hematology and Oncology Follow Up Visit  Kristy [name] [id number] [birth year] 66 y.o. 08/25/2021   Principle Diagnosis:  Waldenstrm's macroglobulinemia Acquired hypogammaglobulinemia - recurrent sinusitis Pernicious Anemia  Current Therapy:   Imbruvica 420 mg PO daily IVIG 40 g IV infusion every 6 weeks Vit B12 1000 mcg IM q 6 weeks    Interim History:  [name] is here today for follow-up and treatment.  He is doing quite well.  She has had no problems since we last saw her.  She is busy helping with her granddaughter.  I think she was at a dance recital last night.  I am sure that she will have a wonderful weekend with her.  When we last checked her monoclonal studies, there was a monoclonal spike of 0.2 g/dL.  I am unsure exactly what this indicates.  We will have to watch this closely.  Her IgM level was nice and low at 108 mg/dL.  She has had no problems infections.  She has had sinus issues.  However, I think the IVIG does seem to be helping this.  She has had no change in bowel or bladder habits.  There is been no diarrhea.  She is on [place].  She is doing well with the Imbruvica.  She does not have any toxicity from it.  Her last vitamin B12 level was 318 pg/ml.  She does get the vitamin B12 every 6 weeks.  She is having some arthritic issues.  She is having some pain in the right hip.  She has had this in the past.  She has had injections for this in the past.  Currently, I would have to say that her performance status is probably ECOG 0.    Medications:  Allergies as of 08/25/2021       Reactions   Ibuprofen Other (See Comments)   Can not take due to medication        Medication List        Accurate as of August 25, 2021  9:20 AM. If you have any questions, ask your nurse or doctor.          amLODipine 5 MG tablet Commonly known as: NORVASC Take 5 mg by mouth daily.   Cholecalciferol 25 MCG (1000 UT) tablet Take by mouth daily.    cyanocobalamin 1000 MCG/ML injection Commonly known as: (VITAMIN B-12) INJECT 1 ML INTO THE MUSCLE EVERY 2 MONTHS   estradiol 0.1 MG/GM vaginal cream Commonly known as: ESTRACE Place 1 Applicatorful vaginally 3 (three) times a week.   Imbruvica 420 MG tablet Generic drug: ibrutinib TAKE 1 TABLET BY MOUTH DAILY AFTER BREAKFAST.   Lifitegrast 5 % Soln Apply 1 drop to eye 2 (two) times daily.   nitrofurantoin (macrocrystal-monohydrate) 100 MG capsule Commonly known as: MACROBID 100 mg. As needed        Allergies:  Allergies  Allergen Reactions   Ibuprofen Other (See Comments)    Can not take due to medication    Past Medical History, Surgical history, Social history, and Family History were reviewed and updated.  Review of Systems: Review of Systems  Constitutional: Negative.   HENT: Negative.    Eyes: Negative.   Respiratory: Negative.    Cardiovascular: Negative.   Gastrointestinal: Negative.   Genitourinary: Negative.   Musculoskeletal: Negative.   Skin: Negative.   Neurological: Negative.   Endo/Heme/Allergies: Negative.   Psychiatric/Behavioral:  The patient is nervous/anxious.       Physical Exam:  height is 5' (  1.524 m) and weight is 109 lb 1.9 oz (49.5 kg). Her oral temperature is 97.7 F (36.5 C). Her blood pressure is 125/66 and her pulse is 75. Her respiration is 18 and oxygen saturation is 100%.   Wt Readings from Last 3 Encounters:  08/25/21 109 lb 1.9 oz (49.5 kg)  07/14/21 112 lb (50.8 kg)  06/02/21 113 lb (51.3 kg)    Physical Exam Vitals reviewed.  HENT:     Head: Normocephalic and atraumatic.  Eyes:     Pupils: Pupils are equal, round, and reactive to light.  Cardiovascular:     Rate and Rhythm: Normal rate and regular rhythm.     Heart sounds: Normal heart sounds.  Pulmonary:     Effort: Pulmonary effort is normal.     Breath sounds: Normal breath sounds.  Abdominal:     General: Bowel sounds are normal.     Palpations:  Abdomen is soft.  Musculoskeletal:        General: No tenderness or deformity. Normal range of motion.     Cervical back: Normal range of motion.  Lymphadenopathy:     Cervical: No cervical adenopathy.  Skin:    General: Skin is warm and dry.     Findings: No erythema or rash.  Neurological:     Mental Status: She is alert and oriented to person, place, and time.  Psychiatric:        Behavior: Behavior normal.        Thought Content: Thought content normal.        Judgment: Judgment normal.      Lab Results  Component Value Date   WBC 8.2 08/25/2021   HGB 14.3 08/25/2021   HCT 42.8 08/25/2021   MCV 103.6 (H) 08/25/2021   PLT 119 (L) 08/25/2021   No results found for: "FERRITIN", "IRON", "TIBC", "UIBC", "IRONPCTSAT" Lab Results  Component Value Date   RETICCTPCT 2.9 (H) 10/30/2014   RBC 4.13 08/25/2021   RETICCTABS 98.3 10/30/2014   Lab Results  Component Value Date   KPAFRELGTCHN 8.3 07/14/2021   LAMBDASER 2.9 (L) 07/14/2021   KAPLAMBRATIO 2.86 (H) 07/14/2021   Lab Results  Component Value Date   IGGSERUM 630 07/14/2021   IGGSERUM 685 07/14/2021   IGA 12 (L) 07/14/2021   IGA 12 (L) 07/14/2021   IGMSERUM 107 07/14/2021   IGMSERUM 108 07/14/2021   Lab Results  Component Value Date   TOTALPROTELP 6.5 07/14/2021   ALBUMINELP 4.1 07/14/2021   A1GS 0.3 07/14/2021   A2GS 0.6 07/14/2021   BETS 0.9 07/14/2021   BETA2SER 0.2 02/15/2015   GAMS 0.7 07/14/2021   MSPIKE 0.2 (H) 07/14/2021   SPEI Comment 09/23/2020     Chemistry      Component Value Date/Time   NA 141 08/25/2021 0830   NA 147 (H) 03/19/2017 0849   NA 139 03/16/2016 1337   K 4.3 08/25/2021 0830   K 4.5 03/19/2017 0849   K 3.8 03/16/2016 1337   CL 102 08/25/2021 0830   CL 108 03/19/2017 0849   CO2 31 08/25/2021 0830   CO2 31 03/19/2017 0849   CO2 27 03/16/2016 1337   BUN 10 08/25/2021 0830   BUN 11 03/19/2017 0849   BUN 5.2 (L) 03/16/2016 1337   CREATININE 0.89 08/25/2021 0830    CREATININE 1.0 03/19/2017 0849   CREATININE 0.7 03/16/2016 1337      Component Value Date/Time   CALCIUM 9.5 08/25/2021 0830   CALCIUM 9.6 03/19/2017 0849  CALCIUM 8.8 03/16/2016 1337   ALKPHOS 64 08/25/2021 0830   ALKPHOS 53 03/19/2017 0849   ALKPHOS 62 03/16/2016 1337   AST 25 08/25/2021 0830   AST 24 03/16/2016 1337   ALT 21 08/25/2021 0830   ALT 23 03/19/2017 0849   ALT 19 03/16/2016 1337   BILITOT 1.2 08/25/2021 0830   BILITOT 0.57 03/16/2016 1337       Impression and Plan: Ms. Rotundo is a very pleasant 66 yo caucasian female with Waldenstrom's with acquired hypogammaglobulinemia and recurrent sinusitis.  She is doing well.  The Waldenstrom's has not been a problem for her.  Again I am not sure the significance of the monoclonal spike when we last saw her.  It will be interesting to see what it is this time.  For right now, we are not can make any changes with her protocol.  We will go ahead with the IVIG and vitamin B12 injections.  We will plan to get her back in 6 weeks.     [name], MD 6/8/20239:20 AM

## 2021-08-25 NOTE — Progress Notes (Signed)
Pt declined to stay for post infusion observation period. Pt stated she has tolerated medication multiple times prior without difficulty. Pt aware to call clinic with any questions or concerns. Pt verbalized understanding and had no further questions.  ? ?

## 2021-08-26 LAB — KAPPA/LAMBDA LIGHT CHAINS
Kappa free light chain: 8.6 mg/L (ref 3.3–19.4)
Kappa, lambda light chain ratio: 3.19 — ABNORMAL HIGH (ref 0.26–1.65)
Lambda free light chains: 2.7 mg/L — ABNORMAL LOW (ref 5.7–26.3)

## 2021-08-27 LAB — IGG, IGA, IGM
IgA: 11 mg/dL — ABNORMAL LOW (ref 87–352)
IgG (Immunoglobin G), Serum: 653 mg/dL (ref 586–1602)
IgM (Immunoglobulin M), Srm: 87 mg/dL (ref 26–217)

## 2021-08-29 LAB — PROTEIN ELECTROPHORESIS, SERUM, WITH REFLEX
A/G Ratio: 1.7 (ref 0.7–1.7)
Albumin ELP: 3.8 g/dL (ref 2.9–4.4)
Alpha-1-Globulin: 0.2 g/dL (ref 0.0–0.4)
Alpha-2-Globulin: 0.5 g/dL (ref 0.4–1.0)
Beta Globulin: 0.8 g/dL (ref 0.7–1.3)
Gamma Globulin: 0.6 g/dL (ref 0.4–1.8)
Globulin, Total: 2.2 g/dL (ref 2.2–3.9)
Total Protein ELP: 6 g/dL (ref 6.0–8.5)

## 2021-09-05 ENCOUNTER — Other Ambulatory Visit (HOSPITAL_COMMUNITY): Payer: Self-pay

## 2021-09-06 ENCOUNTER — Other Ambulatory Visit (HOSPITAL_COMMUNITY): Payer: Self-pay

## 2021-09-27 ENCOUNTER — Other Ambulatory Visit (HOSPITAL_COMMUNITY): Payer: Self-pay

## 2021-09-29 ENCOUNTER — Other Ambulatory Visit (HOSPITAL_COMMUNITY): Payer: Self-pay

## 2021-10-06 ENCOUNTER — Telehealth: Payer: Self-pay | Admitting: *Deleted

## 2021-10-06 ENCOUNTER — Inpatient Hospital Stay: Payer: Federal, State, Local not specified - PPO | Attending: Hematology & Oncology

## 2021-10-06 ENCOUNTER — Inpatient Hospital Stay (HOSPITAL_BASED_OUTPATIENT_CLINIC_OR_DEPARTMENT_OTHER): Payer: Federal, State, Local not specified - PPO | Admitting: Hematology & Oncology

## 2021-10-06 ENCOUNTER — Encounter: Payer: Self-pay | Admitting: Hematology & Oncology

## 2021-10-06 ENCOUNTER — Inpatient Hospital Stay: Payer: Federal, State, Local not specified - PPO

## 2021-10-06 VITALS — BP 117/63 | HR 69 | Resp 14

## 2021-10-06 VITALS — BP 138/73 | HR 78 | Temp 97.9°F | Resp 20 | Ht 60.0 in | Wt 112.0 lb

## 2021-10-06 DIAGNOSIS — C880 Waldenstrom macroglobulinemia: Secondary | ICD-10-CM

## 2021-10-06 DIAGNOSIS — D801 Nonfamilial hypogammaglobulinemia: Secondary | ICD-10-CM | POA: Insufficient documentation

## 2021-10-06 DIAGNOSIS — J329 Chronic sinusitis, unspecified: Secondary | ICD-10-CM | POA: Diagnosis not present

## 2021-10-06 DIAGNOSIS — D510 Vitamin B12 deficiency anemia due to intrinsic factor deficiency: Secondary | ICD-10-CM | POA: Diagnosis not present

## 2021-10-06 DIAGNOSIS — J0101 Acute recurrent maxillary sinusitis: Secondary | ICD-10-CM

## 2021-10-06 LAB — LACTATE DEHYDROGENASE: LDH: 207 U/L — ABNORMAL HIGH (ref 98–192)

## 2021-10-06 LAB — CBC WITH DIFFERENTIAL (CANCER CENTER ONLY)
Abs Immature Granulocytes: 0.13 10*3/uL — ABNORMAL HIGH (ref 0.00–0.07)
Basophils Absolute: 0 10*3/uL (ref 0.0–0.1)
Basophils Relative: 0 %
Eosinophils Absolute: 0.1 10*3/uL (ref 0.0–0.5)
Eosinophils Relative: 1 %
HCT: 42.1 % (ref 36.0–46.0)
Hemoglobin: 14.1 g/dL (ref 12.0–15.0)
Immature Granulocytes: 2 %
Lymphocytes Relative: 29 %
Lymphs Abs: 2 10*3/uL (ref 0.7–4.0)
MCH: 34.9 pg — ABNORMAL HIGH (ref 26.0–34.0)
MCHC: 33.5 g/dL (ref 30.0–36.0)
MCV: 104.2 fL — ABNORMAL HIGH (ref 80.0–100.0)
Monocytes Absolute: 0.5 10*3/uL (ref 0.1–1.0)
Monocytes Relative: 8 %
Neutro Abs: 4.1 10*3/uL (ref 1.7–7.7)
Neutrophils Relative %: 60 %
Platelet Count: 118 10*3/uL — ABNORMAL LOW (ref 150–400)
RBC: 4.04 MIL/uL (ref 3.87–5.11)
RDW: 12.6 % (ref 11.5–15.5)
WBC Count: 6.8 10*3/uL (ref 4.0–10.5)
nRBC: 0 % (ref 0.0–0.2)

## 2021-10-06 LAB — CMP (CANCER CENTER ONLY)
ALT: 28 U/L (ref 0–44)
AST: 26 U/L (ref 15–41)
Albumin: 4.7 g/dL (ref 3.5–5.0)
Alkaline Phosphatase: 62 U/L (ref 38–126)
Anion gap: 8 (ref 5–15)
BUN: 11 mg/dL (ref 8–23)
CO2: 28 mmol/L (ref 22–32)
Calcium: 9.3 mg/dL (ref 8.9–10.3)
Chloride: 105 mmol/L (ref 98–111)
Creatinine: 0.76 mg/dL (ref 0.44–1.00)
GFR, Estimated: >60 mL/min (ref >60)
Glucose, Bld: 96 mg/dL (ref 70–99)
Potassium: 3.9 mmol/L (ref 3.5–5.1)
Sodium: 141 mmol/L (ref 135–145)
Total Bilirubin: 1.2 mg/dL (ref 0.3–1.2)
Total Protein: 6.6 g/dL (ref 6.5–8.1)

## 2021-10-06 LAB — VITAMIN B12: Vitamin B-12: 297 pg/mL (ref 180–914)

## 2021-10-06 MED ORDER — DIPHENHYDRAMINE HCL 25 MG PO CAPS
25.0000 mg | ORAL_CAPSULE | Freq: Once | ORAL | Status: AC
  Administered 2021-10-06: 25 mg via ORAL
  Filled 2021-10-06: qty 1

## 2021-10-06 MED ORDER — DEXTROSE 5 % IV SOLN: INTRAVENOUS | Status: DC

## 2021-10-06 MED ORDER — ACETAMINOPHEN 325 MG PO TABS
650.0000 mg | ORAL_TABLET | Freq: Once | ORAL | Status: AC
  Administered 2021-10-06: 650 mg via ORAL
  Filled 2021-10-06: qty 2

## 2021-10-06 MED ORDER — IMMUNE GLOBULIN (HUMAN) 20 GM/200ML IV SOLN
40.0000 g | Freq: Once | INTRAVENOUS | Status: AC
  Administered 2021-10-06: 40 g via INTRAVENOUS
  Filled 2021-10-06: qty 400

## 2021-10-06 MED ORDER — CYANOCOBALAMIN 1000 MCG/ML IJ SOLN
1000.0000 ug | Freq: Once | INTRAMUSCULAR | Status: AC
  Administered 2021-10-06: 1000 ug via INTRAMUSCULAR
  Filled 2021-10-06: qty 1

## 2021-10-06 NOTE — Patient Instructions (Signed)

## 2021-10-06 NOTE — Progress Notes (Signed)
Hematology and Oncology Follow Up Visit  Kristy [name] [id number] 10/26/1955 66 y.o. 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  She is doing pretty well.  She has had a good summer.  They had a good time down at the beach when she went with her family.  She is still busy with her granddaughter.  She helps out quite a bit.  Her sinuses are doing pretty well.  I think the IVIG is helping this.  Over last saw her back in June, her vitamin B12 level was 267.  When we saw her back in June, her monoclonal spike was not noted.  Her IgM level was 87 mg/dL.  She has had no cough or shortness of breath.  There is no change in bowel or bladder habits.  She has had no leg swelling.  She has had no rashes.  She is doing well on the Imbruvica right now.  Overall, I would say performance status is probably ECOG 0.     Medications:  Allergies as of 10/06/2021       Reactions   Ibuprofen Other (See Comments)   Can not take due to medication        Medication List        Accurate as of October 06, 2021  9:13 AM. If you have any questions, ask your nurse or doctor.          amLODipine 5 MG tablet Commonly known as: NORVASC Take 5 mg by mouth daily.   Cholecalciferol 25 MCG (1000 UT) tablet Take by mouth daily.   cyanocobalamin 1000 MCG/ML injection Commonly known as: (VITAMIN B-12) INJECT 1 ML INTO THE MUSCLE EVERY 2 MONTHS   estradiol 0.1 MG/GM vaginal cream Commonly known as: ESTRACE Place 1 Applicatorful vaginally 3 (three) times a week.   Imbruvica 420 MG tablet Generic drug: ibrutinib TAKE 1 TABLET BY MOUTH DAILY AFTER BREAKFAST.   Lifitegrast 5 % Soln Apply 1 drop to eye 2  (two) times daily.   nitrofurantoin (macrocrystal-monohydrate) 100 MG capsule Commonly known as: MACROBID 100 mg. As needed        Allergies:  Allergies  Allergen Reactions   Ibuprofen Other (See Comments)    Can not take due to medication    Past Medical History, Surgical history, Social history, and Family History were reviewed and updated.  Review of Systems: Review of Systems  Constitutional: Negative.   HENT: Negative.    Eyes: Negative.   Respiratory: Negative.    Cardiovascular: Negative.   Gastrointestinal: Negative.   Genitourinary: Negative.   Musculoskeletal: Negative.   Skin: Negative.   Neurological: Negative.   Endo/Heme/Allergies: Negative.   Psychiatric/Behavioral:  The patient is nervous/anxious.       Physical Exam:  height is 5' (1.524 m) and weight is 112 lb (50.8 kg). Her oral temperature is 97.9 F (36.6 C). Her blood pressure is 138/73 and her pulse is 78. Her respiration is 20 and oxygen saturation is 96%.   Wt Readings from Last 3 Encounters:  10/06/21 112 lb (50.8 kg)  08/25/21 109 lb 1.9 oz (49.5 kg)  07/14/21 112 lb (50.8 kg)    Physical Exam Vitals reviewed.  HENT:  Head: Normocephalic and atraumatic.  Eyes:     Pupils: Pupils are equal, round, and reactive to light.  Cardiovascular:     Rate and Rhythm: Normal rate and regular rhythm.     Heart sounds: Normal heart sounds.  Pulmonary:     Effort: Pulmonary effort is normal.     Breath sounds: Normal breath sounds.  Abdominal:     General: Bowel sounds are normal.     Palpations: Abdomen is soft.  Musculoskeletal:        General: No tenderness or deformity. Normal range of motion.     Cervical back: Normal range of motion.  Lymphadenopathy:     Cervical: No cervical adenopathy.  Skin:    General: Skin is warm and dry.     Findings: No erythema or rash.  Neurological:     Mental Status: She is alert and oriented to person, place, and time.  Psychiatric:         Behavior: Behavior normal.        Thought Content: Thought content normal.        Judgment: Judgment normal.      Lab Results  Component Value Date   WBC 6.8 10/06/2021   HGB 14.1 10/06/2021   HCT 42.1 10/06/2021   MCV 104.2 (H) 10/06/2021   PLT 118 (L) 10/06/2021   No results found for: "FERRITIN", "IRON", "TIBC", "UIBC", "IRONPCTSAT" Lab Results  Component Value Date   RETICCTPCT 2.9 (H) 10/30/2014   RBC 4.04 10/06/2021   RETICCTABS 98.3 10/30/2014   Lab Results  Component Value Date   KPAFRELGTCHN 8.6 08/25/2021   LAMBDASER 2.7 (L) 08/25/2021   KAPLAMBRATIO 3.19 (H) 08/25/2021   Lab Results  Component Value Date   IGGSERUM 653 08/25/2021   IGA 11 (L) 08/25/2021   IGMSERUM 87 08/25/2021   Lab Results  Component Value Date   TOTALPROTELP 6.0 08/25/2021   ALBUMINELP 3.8 08/25/2021   A1GS 0.2 08/25/2021   A2GS 0.5 08/25/2021   BETS 0.8 08/25/2021   BETA2SER 0.2 02/15/2015   GAMS 0.6 08/25/2021   MSPIKE Not Observed 08/25/2021   SPEI Comment 09/23/2020     Chemistry      Component Value Date/Time   NA 141 10/06/2021 0826   NA 147 (H) 03/19/2017 0849   NA 139 03/16/2016 1337   K 3.9 10/06/2021 0826   K 4.5 03/19/2017 0849   K 3.8 03/16/2016 1337   CL 105 10/06/2021 0826   CL 108 03/19/2017 0849   CO2 28 10/06/2021 0826   CO2 31 03/19/2017 0849   CO2 27 03/16/2016 1337   BUN 11 10/06/2021 0826   BUN 11 03/19/2017 0849   BUN 5.2 (L) 03/16/2016 1337   CREATININE 0.76 10/06/2021 0826   CREATININE 1.0 03/19/2017 0849   CREATININE 0.7 03/16/2016 1337      Component Value Date/Time   CALCIUM 9.3 10/06/2021 0826   CALCIUM 9.6 03/19/2017 0849   CALCIUM 8.8 03/16/2016 1337   ALKPHOS 62 10/06/2021 0826   ALKPHOS 53 03/19/2017 0849   ALKPHOS 62 03/16/2016 1337   AST 26 10/06/2021 0826   AST 24 03/16/2016 1337   ALT 28 10/06/2021 0826   ALT 23 03/19/2017 0849   ALT 19 03/16/2016 1337   BILITOT 1.2 10/06/2021 0826   BILITOT 0.57 03/16/2016 1337        Impression and Plan: Kristy [name] is a very pleasant 66 yo caucasian female with Waldenstrom's with acquired hypogammaglobulinemia and recurrent sinusitis.  She is  doing well.  The Waldenstrom's has not been a problem for her.  We will see what her monoclonal studies show.  They really have been low to nonexistent.  The IVIG is clearly helping her sinuses.  We will continue to have her come back every 6 weeks.    [name], MD 7/20/20239:13 AM

## 2021-10-06 NOTE — Telephone Encounter (Signed)
Per 10/06/21 los - gave upcoming appointments - confirmed

## 2021-10-07 LAB — IGG, IGA, IGM
IgA: 11 mg/dL — ABNORMAL LOW (ref 87–352)
IgG (Immunoglobin G), Serum: 612 mg/dL (ref 586–1602)
IgM (Immunoglobulin M), Srm: 74 mg/dL (ref 26–217)

## 2021-10-07 LAB — KAPPA/LAMBDA LIGHT CHAINS
Kappa free light chain: 7.6 mg/L (ref 3.3–19.4)
Kappa, lambda light chain ratio: 3.04 — ABNORMAL HIGH (ref 0.26–1.65)
Lambda free light chains: 2.5 mg/L — ABNORMAL LOW (ref 5.7–26.3)

## 2021-10-10 LAB — PROTEIN ELECTROPHORESIS, SERUM, WITH REFLEX
A/G Ratio: 1.6 (ref 0.7–1.7)
Albumin ELP: 3.7 g/dL (ref 2.9–4.4)
Alpha-1-Globulin: 0.3 g/dL (ref 0.0–0.4)
Alpha-2-Globulin: 0.5 g/dL (ref 0.4–1.0)
Beta Globulin: 0.9 g/dL (ref 0.7–1.3)
Gamma Globulin: 0.6 g/dL (ref 0.4–1.8)
Globulin, Total: 2.3 g/dL (ref 2.2–3.9)
Total Protein ELP: 6 g/dL (ref 6.0–8.5)

## 2021-10-21 ENCOUNTER — Other Ambulatory Visit (HOSPITAL_COMMUNITY): Payer: Self-pay

## 2021-10-28 ENCOUNTER — Other Ambulatory Visit (HOSPITAL_COMMUNITY): Payer: Self-pay

## 2021-11-17 ENCOUNTER — Inpatient Hospital Stay: Payer: Federal, State, Local not specified - PPO

## 2021-11-17 ENCOUNTER — Telehealth: Payer: Self-pay | Admitting: *Deleted

## 2021-11-17 ENCOUNTER — Inpatient Hospital Stay: Payer: Federal, State, Local not specified - PPO | Attending: Hematology & Oncology

## 2021-11-17 ENCOUNTER — Inpatient Hospital Stay (HOSPITAL_BASED_OUTPATIENT_CLINIC_OR_DEPARTMENT_OTHER): Payer: Federal, State, Local not specified - PPO | Admitting: Hematology & Oncology

## 2021-11-17 ENCOUNTER — Encounter: Payer: Self-pay | Admitting: Hematology & Oncology

## 2021-11-17 VITALS — BP 127/69 | HR 80 | Temp 97.4°F | Resp 20 | Ht 60.0 in | Wt 109.8 lb

## 2021-11-17 VITALS — BP 128/75 | HR 72 | Temp 98.0°F | Resp 18

## 2021-11-17 DIAGNOSIS — J0101 Acute recurrent maxillary sinusitis: Secondary | ICD-10-CM

## 2021-11-17 DIAGNOSIS — C880 Waldenstrom macroglobulinemia: Secondary | ICD-10-CM

## 2021-11-17 DIAGNOSIS — D801 Nonfamilial hypogammaglobulinemia: Secondary | ICD-10-CM | POA: Diagnosis not present

## 2021-11-17 DIAGNOSIS — D510 Vitamin B12 deficiency anemia due to intrinsic factor deficiency: Secondary | ICD-10-CM | POA: Diagnosis present

## 2021-11-17 DIAGNOSIS — J329 Chronic sinusitis, unspecified: Secondary | ICD-10-CM | POA: Insufficient documentation

## 2021-11-17 LAB — CMP (CANCER CENTER ONLY)
ALT: 12 U/L (ref 0–44)
AST: 19 U/L (ref 15–41)
Albumin: 4.5 g/dL (ref 3.5–5.0)
Alkaline Phosphatase: 62 U/L (ref 38–126)
Anion gap: 8 (ref 5–15)
BUN: 10 mg/dL (ref 8–23)
CO2: 29 mmol/L (ref 22–32)
Calcium: 9.4 mg/dL (ref 8.9–10.3)
Chloride: 103 mmol/L (ref 98–111)
Creatinine: 0.79 mg/dL (ref 0.44–1.00)
GFR, Estimated: >60 mL/min (ref >60)
Glucose, Bld: 93 mg/dL (ref 70–99)
Potassium: 4 mmol/L (ref 3.5–5.1)
Sodium: 140 mmol/L (ref 135–145)
Total Bilirubin: 1.5 mg/dL — ABNORMAL HIGH (ref 0.3–1.2)
Total Protein: 6.6 g/dL (ref 6.5–8.1)

## 2021-11-17 LAB — CBC WITH DIFFERENTIAL (CANCER CENTER ONLY)
Abs Immature Granulocytes: 0.11 10*3/uL — ABNORMAL HIGH (ref 0.00–0.07)
Basophils Absolute: 0 10*3/uL (ref 0.0–0.1)
Basophils Relative: 1 %
Eosinophils Absolute: 0 10*3/uL (ref 0.0–0.5)
Eosinophils Relative: 1 %
HCT: 40.5 % (ref 36.0–46.0)
Hemoglobin: 13.7 g/dL (ref 12.0–15.0)
Immature Granulocytes: 2 %
Lymphocytes Relative: 28 %
Lymphs Abs: 1.7 10*3/uL (ref 0.7–4.0)
MCH: 34.9 pg — ABNORMAL HIGH (ref 26.0–34.0)
MCHC: 33.8 g/dL (ref 30.0–36.0)
MCV: 103.3 fL — ABNORMAL HIGH (ref 80.0–100.0)
Monocytes Absolute: 0.4 10*3/uL (ref 0.1–1.0)
Monocytes Relative: 7 %
Neutro Abs: 3.6 10*3/uL (ref 1.7–7.7)
Neutrophils Relative %: 61 %
Platelet Count: 83 10*3/uL — ABNORMAL LOW (ref 150–400)
RBC: 3.92 MIL/uL (ref 3.87–5.11)
RDW: 12.5 % (ref 11.5–15.5)
WBC Count: 5.8 10*3/uL (ref 4.0–10.5)
nRBC: 0 % (ref 0.0–0.2)

## 2021-11-17 LAB — LACTATE DEHYDROGENASE: LDH: 208 U/L — ABNORMAL HIGH (ref 98–192)

## 2021-11-17 LAB — VITAMIN B12: Vitamin B-12: 329 pg/mL (ref 180–914)

## 2021-11-17 MED ORDER — DEXTROSE 5 % IV SOLN: INTRAVENOUS | Status: DC

## 2021-11-17 MED ORDER — IMMUNE GLOBULIN (HUMAN) 20 GM/200ML IV SOLN
40.0000 g | Freq: Once | INTRAVENOUS | Status: AC
  Administered 2021-11-17: 40 g via INTRAVENOUS
  Filled 2021-11-17: qty 400

## 2021-11-17 MED ORDER — CYANOCOBALAMIN 1000 MCG/ML IJ SOLN
1000.0000 ug | Freq: Once | INTRAMUSCULAR | Status: AC
  Administered 2021-11-17: 1000 ug via INTRAMUSCULAR
  Filled 2021-11-17: qty 1

## 2021-11-17 MED ORDER — ACETAMINOPHEN 325 MG PO TABS: 650.0000 mg | ORAL_TABLET | Freq: Once | ORAL | Status: DC

## 2021-11-17 MED ORDER — DIPHENHYDRAMINE HCL 25 MG PO CAPS: 25.0000 mg | ORAL_CAPSULE | Freq: Once | ORAL | Status: DC

## 2021-11-17 NOTE — Telephone Encounter (Signed)
Per 11/17/21 los - gave upcoming appointments - confirmed

## 2021-11-17 NOTE — Progress Notes (Signed)
Hematology and Oncology Follow Up Visit  Kristy [name] [id number] August 30, 1955 66 y.o. 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  She is doing quite well.  As always, she is been quite busy.  She is looking forward to having a grandparents a day at her granddaughter's school.  I am sure that there will be a wonderful program for all the grandparents.  She has had no problems with fever.  There is been no cough or shortness of breath.  Her sinuses seem to be doing pretty well right now.  When we last saw her back in July, her vitamin B12 level was 300.  We last saw her back in July, her IgM level was 74 mg/dL.  Her IgG level was 612 mg/dL.  There is no monoclonal spike in her blood.  There is been no rashes.  She has had no leg swelling.  She has had no bleeding.  There is been no changes in bowels or bladder.  Overall, I would say performance status is ECOG 0.     Medications:  Allergies as of 11/17/2021       Reactions   Ibuprofen Other (See Comments)   Can not take due to medication        Medication List        Accurate as of November 17, 2021  9:26 AM. If you have any questions, ask your nurse or doctor.          amLODipine 5 MG tablet Commonly known as: NORVASC Take 5 mg by mouth daily.   Cholecalciferol 25 MCG (1000 UT) tablet Take by mouth daily.   cyanocobalamin 1000 MCG/ML injection Commonly known as: VITAMIN B12 INJECT 1 ML INTO THE MUSCLE EVERY 2 MONTHS   estradiol 0.1 MG/GM vaginal cream Commonly known as: ESTRACE Place 1 Applicatorful vaginally 3 (three) times a week.   Imbruvica 420 MG tablet Generic drug: ibrutinib TAKE 1 TABLET BY MOUTH DAILY AFTER  BREAKFAST.   Lifitegrast 5 % Soln Apply 1 drop to eye 2 (two) times daily.   nitrofurantoin (macrocrystal-monohydrate) 100 MG capsule Commonly known as: MACROBID 100 mg. As needed        Allergies:  Allergies  Allergen Reactions   Ibuprofen Other (See Comments)    Can not take due to medication    Past Medical History, Surgical history, Social history, and Family History were reviewed and updated.  Review of Systems: Review of Systems  Constitutional: Negative.   HENT: Negative.    Eyes: Negative.   Respiratory: Negative.    Cardiovascular: Negative.   Gastrointestinal: Negative.   Genitourinary: Negative.   Musculoskeletal: Negative.   Skin: Negative.   Neurological: Negative.   Endo/Heme/Allergies: Negative.   Psychiatric/Behavioral:  The patient is nervous/anxious.       Physical Exam:  height is 5' (1.524 m) and weight is 109 lb 12.8 oz (49.8 kg). Her oral temperature is 97.4 F (36.3 C) (abnormal). Her blood pressure is 127/69 and her pulse is 80. Her respiration is 20 and oxygen saturation is 96%.   Wt Readings from Last 3 Encounters:  11/17/21 109 lb 12.8 oz (49.8 kg)  10/06/21 112 lb (50.8 kg)  08/25/21 109 lb 1.9 oz (  49.5 kg)    Physical Exam Vitals reviewed.  HENT:     Head: Normocephalic and atraumatic.  Eyes:     Pupils: Pupils are equal, round, and reactive to light.  Cardiovascular:     Rate and Rhythm: Normal rate and regular rhythm.     Heart sounds: Normal heart sounds.  Pulmonary:     Effort: Pulmonary effort is normal.     Breath sounds: Normal breath sounds.  Abdominal:     General: Bowel sounds are normal.     Palpations: Abdomen is soft.  Musculoskeletal:        General: No tenderness or deformity. Normal range of motion.     Cervical back: Normal range of motion.  Lymphadenopathy:     Cervical: No cervical adenopathy.  Skin:    General: Skin is warm and dry.     Findings: No erythema or rash.  Neurological:     Mental  Status: She is alert and oriented to person, place, and time.  Psychiatric:        Behavior: Behavior normal.        Thought Content: Thought content normal.        Judgment: Judgment normal.      Lab Results  Component Value Date   WBC 5.8 11/17/2021   HGB 13.7 11/17/2021   HCT 40.5 11/17/2021   MCV 103.3 (H) 11/17/2021   PLT 83 (L) 11/17/2021   No results found for: "FERRITIN", "IRON", "TIBC", "UIBC", "IRONPCTSAT" Lab Results  Component Value Date   RETICCTPCT 2.9 (H) 10/30/2014   RBC 3.92 11/17/2021   RETICCTABS 98.3 10/30/2014   Lab Results  Component Value Date   KPAFRELGTCHN 7.6 10/06/2021   LAMBDASER 2.5 (L) 10/06/2021   KAPLAMBRATIO 3.04 (H) 10/06/2021   Lab Results  Component Value Date   IGGSERUM 612 10/06/2021   IGA 11 (L) 10/06/2021   IGMSERUM 74 10/06/2021   Lab Results  Component Value Date   TOTALPROTELP 6.0 10/06/2021   ALBUMINELP 3.7 10/06/2021   A1GS 0.3 10/06/2021   A2GS 0.5 10/06/2021   BETS 0.9 10/06/2021   BETA2SER 0.2 02/15/2015   GAMS 0.6 10/06/2021   MSPIKE Not Observed 10/06/2021   SPEI Comment 09/23/2020     Chemistry      Component Value Date/Time   NA 140 11/17/2021 0815   NA 147 (H) 03/19/2017 0849   NA 139 03/16/2016 1337   K 4.0 11/17/2021 0815   K 4.5 03/19/2017 0849   K 3.8 03/16/2016 1337   CL 103 11/17/2021 0815   CL 108 03/19/2017 0849   CO2 29 11/17/2021 0815   CO2 31 03/19/2017 0849   CO2 27 03/16/2016 1337   BUN 10 11/17/2021 0815   BUN 11 03/19/2017 0849   BUN 5.2 (L) 03/16/2016 1337   CREATININE 0.79 11/17/2021 0815   CREATININE 1.0 03/19/2017 0849   CREATININE 0.7 03/16/2016 1337      Component Value Date/Time   CALCIUM 9.4 11/17/2021 0815   CALCIUM 9.6 03/19/2017 0849   CALCIUM 8.8 03/16/2016 1337   ALKPHOS 62 11/17/2021 0815   ALKPHOS 53 03/19/2017 0849   ALKPHOS 62 03/16/2016 1337   AST 19 11/17/2021 0815   AST 24 03/16/2016 1337   ALT 12 11/17/2021 0815   ALT 23 03/19/2017 0849   ALT 19  03/16/2016 1337   BILITOT 1.5 (H) 11/17/2021 0815   BILITOT 0.57 03/16/2016 1337       Impression and Plan: Kristy [name] is a very  pleasant 66 yo caucasian female with Waldenstrom's with acquired hypogammaglobulinemia and recurrent sinusitis.  She is doing well.  The Waldenstrom's has not been a problem for her.  We will see what her monoclonal studies show.  Her IgM level has usually been incredibly low.  There is been no monoclonal protein in her blood.    The IVIG is clearly helping her sinuses.  We will continue to have her come back every 6 weeks.    [name], MD 8/31/20239:26 AM

## 2021-11-17 NOTE — Progress Notes (Signed)
Pt declined to stay for post infusion observation period. Pt stated she has tolerated medication multiple times prior without difficulty. Pt aware to call clinic with any questions or concerns. Pt verbalized understanding and had no further questions.  ? ?

## 2021-11-17 NOTE — Patient Instructions (Signed)
Immune Globulin Injection What is this medicine? IMMUNE GLOBULIN (im MUNE GLOB yoo lin) helps to prevent or reduce the severity of certain infections in patients who are at risk. This medicine is collected from the pooled blood of many donors. It is used to treat immune system problems, thrombocytopenia, and Kawasaki syndrome. This medicine may be used for other purposes; ask your health care provider or pharmacist if you have questions. COMMON BRAND NAME(S): ASCENIV, Baygam, BIVIGAM, Carimune, Carimune NF, cutaquig, Cuvitru, Flebogamma, Flebogamma DIF, GamaSTAN, GamaSTAN S/D, Gamimune N, Gammagard, Gammagard S/D, Gammaked, Gammaplex, Gammar-P IV, Gamunex, Gamunex-C, Hizentra, Iveegam, Iveegam EN, Octagam, Panglobulin, Panglobulin NF, panzyga, Polygam S/D, Privigen, Sandoglobulin, Venoglobulin-S, Vigam, Vivaglobulin, Xembify What should I tell my health care provider before I take this medicine? They need to know if you have any of these conditions:  diabetes  extremely low or no immune antibodies in the blood  heart disease  history of blood clots  hyperprolinemia  infection in the blood, sepsis  kidney disease  recently received or scheduled to receive a vaccination  an unusual or allergic reaction to human immune globulin, albumin, maltose, sucrose, other medicines, foods, dyes, or preservatives  pregnant or trying to get pregnant  breast-feeding How should I use this medicine? This medicine is for injection into a muscle or infusion into a vein or skin. It is usually given by a health care professional in a hospital or clinic setting. In rare cases, some brands of this medicine might be given at home. You will be taught how to give this medicine. Use exactly as directed. Take your medicine at regular intervals. Do not take your medicine more often than directed. Talk to your pediatrician regarding the use of this medicine in children. While this drug may be prescribed for selected  conditions, precautions do apply. Overdosage: If you think you have taken too much of this medicine contact a poison control center or emergency room at once. NOTE: This medicine is only for you. Do not share this medicine with others. What if I miss a dose? It is important not to miss your dose. Call your doctor or health care professional if you are unable to keep an appointment. If you give yourself the medicine and you miss a dose, take it as soon as you can. If it is almost time for your next dose, take only that dose. Do not take double or extra doses. What may interact with this medicine?  aspirin and aspirin-like medicines  cisplatin  cyclosporine  medicines for infection like acyclovir, adefovir, amphotericin B, bacitracin, cidofovir, foscarnet, ganciclovir, gentamicin, pentamidine, vancomycin  NSAIDS, medicines for pain and inflammation, like ibuprofen or naproxen  pamidronate  vaccines  zoledronic acid This list may not describe all possible interactions. Give your health care provider a list of all the medicines, herbs, non-prescription drugs, or dietary supplements you use. Also tell them if you smoke, drink alcohol, or use illegal drugs. Some items may interact with your medicine. What should I watch for while using this medicine? Your condition will be monitored carefully while you are receiving this medicine. This medicine is made from pooled blood donations of many different people. It may be possible to pass an infection in this medicine. However, the donors are screened for infections and all products are tested for HIV and hepatitis. The medicine is treated to kill most or all bacteria and viruses. Talk to your doctor about the risks and benefits of this medicine. Do not have vaccinations for at least   14 days before, or until at least 3 months after receiving this medicine. What side effects may I notice from receiving this medicine? Side effects that you should report  to your doctor or health care professional as soon as possible:  allergic reactions like skin rash, itching or hives, swelling of the face, lips, or tongue  blue colored lips or skin  breathing problems  chest pain or tightness  fever  signs and symptoms of aseptic meningitis such as stiff neck; sensitivity to light; headache; drowsiness; fever; nausea; vomiting; rash  signs and symptoms of a blood clot such as chest pain; shortness of breath; pain, swelling, or warmth in the leg  signs and symptoms of hemolytic anemia such as fast heartbeat; tiredness; dark yellow or brown urine; or yellowing of the eyes or skin  signs and symptoms of kidney injury like trouble passing urine or change in the amount of urine  sudden weight gain  swelling of the ankles, feet, hands Side effects that usually do not require medical attention (report to your doctor or health care professional if they continue or are bothersome):  diarrhea  flushing  headache  increased sweating  joint pain  muscle cramps  muscle pain  nausea  pain, redness, or irritation at site where injected  tiredness This list may not describe all possible side effects. Call your doctor for medical advice about side effects. You may report side effects to FDA at 1-800-FDA-1088. Where should I keep my medicine? Keep out of the reach of children. This drug is usually given in a hospital or clinic and will not be stored at home. In rare cases, some brands of this medicine may be given at home. If you are using this medicine at home, you will be instructed on how to store this medicine. Throw away any unused medicine after the expiration date on the label. NOTE: This sheet is a summary. It may not cover all possible information. If you have questions about this medicine, talk to your doctor, pharmacist, or health care provider.  2021 Elsevier/Gold Standard (2018-10-09 12:51:14)  

## 2021-11-18 LAB — KAPPA/LAMBDA LIGHT CHAINS
Kappa free light chain: 6.1 mg/L (ref 3.3–19.4)
Kappa, lambda light chain ratio: 2.35 — ABNORMAL HIGH (ref 0.26–1.65)
Lambda free light chains: 2.6 mg/L — ABNORMAL LOW (ref 5.7–26.3)

## 2021-11-18 LAB — IGG, IGA, IGM
IgA: 11 mg/dL — ABNORMAL LOW (ref 87–352)
IgG (Immunoglobin G), Serum: 614 mg/dL (ref 586–1602)
IgM (Immunoglobulin M), Srm: 68 mg/dL (ref 26–217)

## 2021-11-22 ENCOUNTER — Other Ambulatory Visit (HOSPITAL_COMMUNITY): Payer: Self-pay

## 2021-11-22 LAB — PROTEIN ELECTROPHORESIS, SERUM, WITH REFLEX
A/G Ratio: 1.6 (ref 0.7–1.7)
Albumin ELP: 3.9 g/dL (ref 2.9–4.4)
Alpha-1-Globulin: 0.3 g/dL (ref 0.0–0.4)
Alpha-2-Globulin: 0.5 g/dL (ref 0.4–1.0)
Beta Globulin: 0.9 g/dL (ref 0.7–1.3)
Gamma Globulin: 0.7 g/dL (ref 0.4–1.8)
Globulin, Total: 2.4 g/dL (ref 2.2–3.9)
Total Protein ELP: 6.3 g/dL (ref 6.0–8.5)

## 2021-11-24 ENCOUNTER — Other Ambulatory Visit (HOSPITAL_COMMUNITY): Payer: Self-pay

## 2021-11-25 ENCOUNTER — Other Ambulatory Visit (HOSPITAL_COMMUNITY): Payer: Self-pay

## 2021-11-29 ENCOUNTER — Other Ambulatory Visit (HOSPITAL_COMMUNITY): Payer: Self-pay

## 2021-12-06 ENCOUNTER — Other Ambulatory Visit (HOSPITAL_COMMUNITY): Payer: Self-pay

## 2021-12-07 ENCOUNTER — Other Ambulatory Visit (HOSPITAL_COMMUNITY): Payer: Self-pay

## 2021-12-12 ENCOUNTER — Other Ambulatory Visit (HOSPITAL_COMMUNITY): Payer: Self-pay

## 2021-12-12 ENCOUNTER — Telehealth: Payer: Self-pay | Admitting: Pharmacy Technician

## 2021-12-12 NOTE — Telephone Encounter (Signed)
Oral Oncology Patient Advocate Encounter  Prior Authorization for Kristy [name] has been approved.    PA# [id number] Effective dates: 11/12/2021 through 12/12/2022  Patient may continue to fill at [facility].    Kristy [name], CPhT-Adv Oncology Pharmacy Patient [place] Direct Number: [phone]  Fax: [phone]

## 2021-12-12 NOTE — Telephone Encounter (Signed)
Oral Oncology Patient Advocate Encounter   Received notification that prior authorization for Imbruvica is due for renewal.   PA submitted on 12/12/2021 Key [id number] Status is pending     Lady Deutscher, CPhT-Adv Oncology Pharmacy Patient [place] Direct Number: [phone]  Fax: [phone]

## 2021-12-21 ENCOUNTER — Other Ambulatory Visit (HOSPITAL_COMMUNITY): Payer: Self-pay

## 2021-12-22 ENCOUNTER — Other Ambulatory Visit (HOSPITAL_COMMUNITY): Payer: Self-pay

## 2021-12-26 ENCOUNTER — Other Ambulatory Visit (HOSPITAL_COMMUNITY): Payer: Self-pay

## 2021-12-29 ENCOUNTER — Inpatient Hospital Stay: Payer: Federal, State, Local not specified - PPO | Attending: Hematology & Oncology

## 2021-12-29 ENCOUNTER — Inpatient Hospital Stay: Payer: Federal, State, Local not specified - PPO

## 2021-12-29 ENCOUNTER — Encounter: Payer: Self-pay | Admitting: Medical Oncology

## 2021-12-29 ENCOUNTER — Inpatient Hospital Stay (HOSPITAL_BASED_OUTPATIENT_CLINIC_OR_DEPARTMENT_OTHER): Payer: Federal, State, Local not specified - PPO | Admitting: Medical Oncology

## 2021-12-29 ENCOUNTER — Telehealth: Payer: Self-pay | Admitting: *Deleted

## 2021-12-29 VITALS — BP 135/71 | HR 80 | Temp 98.2°F | Resp 16

## 2021-12-29 VITALS — BP 132/75 | HR 78 | Temp 97.9°F | Resp 18 | Wt 107.8 lb

## 2021-12-29 DIAGNOSIS — D510 Vitamin B12 deficiency anemia due to intrinsic factor deficiency: Secondary | ICD-10-CM | POA: Diagnosis present

## 2021-12-29 DIAGNOSIS — D801 Nonfamilial hypogammaglobulinemia: Secondary | ICD-10-CM | POA: Diagnosis present

## 2021-12-29 DIAGNOSIS — C880 Waldenstrom macroglobulinemia: Secondary | ICD-10-CM

## 2021-12-29 DIAGNOSIS — J0101 Acute recurrent maxillary sinusitis: Secondary | ICD-10-CM

## 2021-12-29 DIAGNOSIS — J329 Chronic sinusitis, unspecified: Secondary | ICD-10-CM | POA: Diagnosis not present

## 2021-12-29 LAB — CBC WITH DIFFERENTIAL (CANCER CENTER ONLY)
Abs Immature Granulocytes: 0.09 10*3/uL — ABNORMAL HIGH (ref 0.00–0.07)
Basophils Absolute: 0 10*3/uL (ref 0.0–0.1)
Basophils Relative: 0 %
Eosinophils Absolute: 0.1 10*3/uL (ref 0.0–0.5)
Eosinophils Relative: 1 %
HCT: 40.6 % (ref 36.0–46.0)
Hemoglobin: 13.8 g/dL (ref 12.0–15.0)
Immature Granulocytes: 1 %
Lymphocytes Relative: 25 %
Lymphs Abs: 2.4 10*3/uL (ref 0.7–4.0)
MCH: 34.9 pg — ABNORMAL HIGH (ref 26.0–34.0)
MCHC: 34 g/dL (ref 30.0–36.0)
MCV: 102.8 fL — ABNORMAL HIGH (ref 80.0–100.0)
Monocytes Absolute: 0.6 10*3/uL (ref 0.1–1.0)
Monocytes Relative: 6 %
Neutro Abs: 6.4 10*3/uL (ref 1.7–7.7)
Neutrophils Relative %: 67 %
Platelet Count: 140 10*3/uL — ABNORMAL LOW (ref 150–400)
RBC: 3.95 MIL/uL (ref 3.87–5.11)
RDW: 12.2 % (ref 11.5–15.5)
WBC Count: 9.6 10*3/uL (ref 4.0–10.5)
nRBC: 0 % (ref 0.0–0.2)

## 2021-12-29 LAB — CMP (CANCER CENTER ONLY)
ALT: 16 U/L (ref 0–44)
AST: 21 U/L (ref 15–41)
Albumin: 4.4 g/dL (ref 3.5–5.0)
Alkaline Phosphatase: 62 U/L (ref 38–126)
Anion gap: 8 (ref 5–15)
BUN: 11 mg/dL (ref 8–23)
CO2: 30 mmol/L (ref 22–32)
Calcium: 9.5 mg/dL (ref 8.9–10.3)
Chloride: 102 mmol/L (ref 98–111)
Creatinine: 0.79 mg/dL (ref 0.44–1.00)
GFR, Estimated: >60 mL/min (ref >60)
Glucose, Bld: 106 mg/dL — ABNORMAL HIGH (ref 70–99)
Potassium: 4.1 mmol/L (ref 3.5–5.1)
Sodium: 140 mmol/L (ref 135–145)
Total Bilirubin: 1.2 mg/dL (ref 0.3–1.2)
Total Protein: 6.7 g/dL (ref 6.5–8.1)

## 2021-12-29 LAB — VITAMIN B12: Vitamin B-12: 360 pg/mL (ref 180–914)

## 2021-12-29 LAB — LACTATE DEHYDROGENASE: LDH: 200 U/L — ABNORMAL HIGH (ref 98–192)

## 2021-12-29 MED ORDER — CYANOCOBALAMIN 1000 MCG/ML IJ SOLN
1000.0000 ug | Freq: Once | INTRAMUSCULAR | Status: AC
  Administered 2021-12-29: 1000 ug via INTRAMUSCULAR
  Filled 2021-12-29: qty 1

## 2021-12-29 MED ORDER — IMMUNE GLOBULIN (HUMAN) 20 GM/200ML IV SOLN
40.0000 g | Freq: Once | INTRAVENOUS | Status: AC
  Administered 2021-12-29: 40 g via INTRAVENOUS
  Filled 2021-12-29: qty 400

## 2021-12-29 MED ORDER — DIPHENHYDRAMINE HCL 25 MG PO CAPS
25.0000 mg | ORAL_CAPSULE | Freq: Once | ORAL | Status: AC
  Administered 2021-12-29: 25 mg via ORAL
  Filled 2021-12-29: qty 1

## 2021-12-29 MED ORDER — ACETAMINOPHEN 325 MG PO TABS
650.0000 mg | ORAL_TABLET | Freq: Once | ORAL | Status: AC
  Administered 2021-12-29: 650 mg via ORAL
  Filled 2021-12-29: qty 2

## 2021-12-29 MED ORDER — DEXTROSE 5 % IV SOLN: INTRAVENOUS | Status: DC

## 2021-12-29 NOTE — Progress Notes (Signed)
Hematology and Oncology Follow Up Visit  Kristy [name] [id number] 07-08-1955 66 y.o. 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   She reports that she is doing well. Spending more time with her granddaughter which she enjoys.   She has not noticed any fevers, bleeding, bruising or anything symptoms of concern.   She is tolerating her IVIG well; B12 as well.   Curious to see how her platelet count looks today as it had decreased to 83 at last visit which was unusual.    Medications:  Allergies as of 12/29/2021       Reactions   Ibuprofen Other (See Comments)   Can not take due to medication        Medication List        Accurate as of December 29, 2021  9:46 AM. If you have any questions, ask your nurse or doctor.          amLODipine 5 MG tablet Commonly known as: NORVASC Take 5 mg by mouth daily.   cefdinir 300 MG capsule Commonly known as: OMNICEF Take 300 mg by mouth 2 (two) times daily.   Cholecalciferol 25 MCG (1000 UT) tablet Take by mouth daily.   cyanocobalamin 1000 MCG/ML injection Commonly known as: VITAMIN B12 INJECT 1 ML INTO THE MUSCLE EVERY 2 MONTHS   estradiol 0.1 MG/GM vaginal cream Commonly known as: ESTRACE Place 1 Applicatorful vaginally 3 (three) times a week.   Imbruvica 420 MG tablet Generic drug: ibrutinib TAKE 1 TABLET BY MOUTH DAILY AFTER BREAKFAST.   Lifitegrast 5 % Soln Apply 1 drop to eye 2 (two) times daily.   nitrofurantoin (macrocrystal-monohydrate) 100 MG capsule Commonly known as: MACROBID 100 mg. As needed        Allergies:  Allergies  Allergen Reactions   Ibuprofen Other (See Comments)    Can not take due to  medication    Past Medical History, Surgical history, Social history, and Family History were reviewed and updated.  Review of Systems: Review of Systems  Constitutional: Negative.   HENT: Negative.    Eyes: Negative.   Respiratory: Negative.    Cardiovascular: Negative.   Gastrointestinal: Negative.   Genitourinary: Negative.   Musculoskeletal: Negative.   Skin: Negative.   Neurological: Negative.   Endo/Heme/Allergies: Negative.   Psychiatric/Behavioral:  The patient is not nervous/anxious.      Physical Exam:  weight is 107 lb 12.8 oz (48.9 kg). Her oral temperature is 97.9 F (36.6 C). Her blood pressure is 132/75 and her pulse is 78. Her respiration is 18 and oxygen saturation is 100%.   Wt Readings from Last 3 Encounters:  12/29/21 107 lb 12.8 oz (48.9 kg)  11/17/21 109 lb 12.8 oz (49.8 kg)  10/06/21 112 lb (50.8 kg)    Physical Exam Vitals reviewed.  HENT:     Head: Normocephalic and atraumatic.  Eyes:     Pupils: Pupils are equal, round, and reactive to light.  Cardiovascular:     Rate and Rhythm: Normal rate and regular rhythm.     Heart sounds: Normal heart sounds.  Pulmonary:     Effort: Pulmonary effort is normal.     Breath sounds: Normal breath sounds.  Abdominal:  General: Bowel sounds are normal.     Palpations: Abdomen is soft.  Musculoskeletal:        General: No tenderness or deformity. Normal range of motion.     Cervical back: Normal range of motion.  Lymphadenopathy:     Cervical: No cervical adenopathy.  Skin:    General: Skin is warm and dry.     Findings: No erythema or rash.  Neurological:     Mental Status: She is alert and oriented to person, place, and time.  Psychiatric:        Behavior: Behavior normal.        Thought Content: Thought content normal.        Judgment: Judgment normal.      Lab Results  Component Value Date   WBC 9.6 12/29/2021   HGB 13.8 12/29/2021   HCT 40.6 12/29/2021   MCV 102.8 (H) 12/29/2021    PLT 140 (L) 12/29/2021   No results found for: "FERRITIN", "IRON", "TIBC", "UIBC", "IRONPCTSAT" Lab Results  Component Value Date   RETICCTPCT 2.9 (H) 10/30/2014   RBC 3.95 12/29/2021   RETICCTABS 98.3 10/30/2014   Lab Results  Component Value Date   KPAFRELGTCHN 6.1 11/17/2021   LAMBDASER 2.6 (L) 11/17/2021   KAPLAMBRATIO 2.35 (H) 11/17/2021   Lab Results  Component Value Date   IGGSERUM 614 11/17/2021   IGA 11 (L) 11/17/2021   IGMSERUM 68 11/17/2021   Lab Results  Component Value Date   TOTALPROTELP 6.3 11/17/2021   ALBUMINELP 3.9 11/17/2021   A1GS 0.3 11/17/2021   A2GS 0.5 11/17/2021   BETS 0.9 11/17/2021   BETA2SER 0.2 02/15/2015   GAMS 0.7 11/17/2021   MSPIKE Not Observed 11/17/2021   SPEI Comment 09/23/2020     Chemistry      Component Value Date/Time   NA 140 12/29/2021 0814   NA 147 (H) 03/19/2017 0849   NA 139 03/16/2016 1337   K 4.1 12/29/2021 0814   K 4.5 03/19/2017 0849   K 3.8 03/16/2016 1337   CL 102 12/29/2021 0814   CL 108 03/19/2017 0849   CO2 30 12/29/2021 0814   CO2 31 03/19/2017 0849   CO2 27 03/16/2016 1337   BUN 11 12/29/2021 0814   BUN 11 03/19/2017 0849   BUN 5.2 (L) 03/16/2016 1337   CREATININE 0.79 12/29/2021 0814   CREATININE 1.0 03/19/2017 0849   CREATININE 0.7 03/16/2016 1337      Component Value Date/Time   CALCIUM 9.5 12/29/2021 0814   CALCIUM 9.6 03/19/2017 0849   CALCIUM 8.8 03/16/2016 1337   ALKPHOS 62 12/29/2021 0814   ALKPHOS 53 03/19/2017 0849   ALKPHOS 62 03/16/2016 1337   AST 21 12/29/2021 0814   AST 24 03/16/2016 1337   ALT 16 12/29/2021 0814   ALT 23 03/19/2017 0849   ALT 19 03/16/2016 1337   BILITOT 1.2 12/29/2021 0814   BILITOT 0.57 03/16/2016 1337       Impression and Plan: Kristy [name] is a very pleasant 66 yo caucasian female with Waldenstrom's with acquired hypogammaglobulinemia and recurrent sinusitis.  Her Waldenstrom's continues to not be a problem for her. Labs  related to this pending at  this time but have been very low in the past with no monoclonal protein in her blood.    Continue to do well with her IVIG and B12 treatments every 6 weeks. Recently recovered from a mild viral illness but overall is doing really well. She would like to proceed forward with  treatment today.   We will continue to have her come back every 6 weeks.    Disposition: Today: IVIG, B12 RTC 6 weeks MD, labs, IVIG, Many, PA-C 10/12/20239:46 AM

## 2021-12-29 NOTE — Telephone Encounter (Signed)
Per 12/29/21 los - gave upcoming appointments - confirmed

## 2021-12-30 LAB — IGG, IGA, IGM
IgA: 7 mg/dL — ABNORMAL LOW (ref 87–352)
IgG (Immunoglobin G), Serum: 619 mg/dL (ref 586–1602)
IgM (Immunoglobulin M), Srm: 67 mg/dL (ref 26–217)

## 2021-12-30 LAB — KAPPA/LAMBDA LIGHT CHAINS
Kappa free light chain: 6.7 mg/L (ref 3.3–19.4)
Kappa, lambda light chain ratio: 2.58 — ABNORMAL HIGH (ref 0.26–1.65)
Lambda free light chains: 2.6 mg/L — ABNORMAL LOW (ref 5.7–26.3)

## 2022-01-02 LAB — PROTEIN ELECTROPHORESIS, SERUM, WITH REFLEX
A/G Ratio: 1.4 (ref 0.7–1.7)
Albumin ELP: 3.6 g/dL (ref 2.9–4.4)
Alpha-1-Globulin: 0.3 g/dL (ref 0.0–0.4)
Alpha-2-Globulin: 0.6 g/dL (ref 0.4–1.0)
Beta Globulin: 0.9 g/dL (ref 0.7–1.3)
Gamma Globulin: 0.7 g/dL (ref 0.4–1.8)
Globulin, Total: 2.5 g/dL (ref 2.2–3.9)
Total Protein ELP: 6.1 g/dL (ref 6.0–8.5)

## 2022-01-07 ENCOUNTER — Other Ambulatory Visit: Payer: Self-pay

## 2022-01-07 ENCOUNTER — Inpatient Hospital Stay (HOSPITAL_COMMUNITY)
Admission: EM | Admit: 2022-01-07 | Discharge: 2022-01-09 | DRG: 158 | Disposition: A | Discharge status: Home / Self Care | Payer: Federal, State, Local not specified - PPO | Attending: Internal Medicine | Admitting: Internal Medicine

## 2022-01-07 ENCOUNTER — Encounter (HOSPITAL_COMMUNITY): Payer: Self-pay | Admitting: Emergency Medicine

## 2022-01-07 ENCOUNTER — Emergency Department (HOSPITAL_COMMUNITY): Payer: Federal, State, Local not specified - PPO

## 2022-01-07 DIAGNOSIS — K122 Cellulitis and abscess of mouth: Principal | ICD-10-CM | POA: Diagnosis present

## 2022-01-07 DIAGNOSIS — D510 Vitamin B12 deficiency anemia due to intrinsic factor deficiency: Secondary | ICD-10-CM | POA: Diagnosis present

## 2022-01-07 DIAGNOSIS — Z79899 Other long term (current) drug therapy: Secondary | ICD-10-CM | POA: Diagnosis not present

## 2022-01-07 DIAGNOSIS — J320 Chronic maxillary sinusitis: Secondary | ICD-10-CM | POA: Diagnosis present

## 2022-01-07 DIAGNOSIS — T451X5A Adverse effect of antineoplastic and immunosuppressive drugs, initial encounter: Secondary | ICD-10-CM | POA: Diagnosis present

## 2022-01-07 DIAGNOSIS — Z79890 Hormone replacement therapy (postmenopausal): Secondary | ICD-10-CM

## 2022-01-07 DIAGNOSIS — Z79622 Long term (current) use of janus kinase inhibitor: Secondary | ICD-10-CM

## 2022-01-07 DIAGNOSIS — I10 Essential (primary) hypertension: Secondary | ICD-10-CM | POA: Diagnosis present

## 2022-01-07 DIAGNOSIS — D802 Selective deficiency of immunoglobulin A [IgA]: Secondary | ICD-10-CM | POA: Diagnosis present

## 2022-01-07 DIAGNOSIS — K146 Glossodynia: Secondary | ICD-10-CM | POA: Diagnosis present

## 2022-01-07 DIAGNOSIS — D801 Nonfamilial hypogammaglobulinemia: Secondary | ICD-10-CM | POA: Diagnosis present

## 2022-01-07 DIAGNOSIS — C880 Waldenstrom macroglobulinemia: Secondary | ICD-10-CM | POA: Diagnosis present

## 2022-01-07 DIAGNOSIS — D696 Thrombocytopenia, unspecified: Secondary | ICD-10-CM | POA: Diagnosis present

## 2022-01-07 LAB — CBC WITH DIFFERENTIAL/PLATELET
Abs Immature Granulocytes: 0.04 10*3/uL (ref 0.00–0.07)
Basophils Absolute: 0 10*3/uL (ref 0.0–0.1)
Basophils Relative: 0 %
Eosinophils Absolute: 0.1 10*3/uL (ref 0.0–0.5)
Eosinophils Relative: 1 %
HCT: 40.6 % (ref 36.0–46.0)
Hemoglobin: 13.8 g/dL (ref 12.0–15.0)
Immature Granulocytes: 1 %
Lymphocytes Relative: 19 %
Lymphs Abs: 1.6 10*3/uL (ref 0.7–4.0)
MCH: 34.8 pg — ABNORMAL HIGH (ref 26.0–34.0)
MCHC: 34 g/dL (ref 30.0–36.0)
MCV: 102.3 fL — ABNORMAL HIGH (ref 80.0–100.0)
Monocytes Absolute: 0.7 10*3/uL (ref 0.1–1.0)
Monocytes Relative: 9 %
Neutro Abs: 5.9 10*3/uL (ref 1.7–7.7)
Neutrophils Relative %: 70 %
Platelets: 103 10*3/uL — ABNORMAL LOW (ref 150–400)
RBC: 3.97 MIL/uL (ref 3.87–5.11)
RDW: 12.2 % (ref 11.5–15.5)
WBC: 8.3 10*3/uL (ref 4.0–10.5)
nRBC: 0 % (ref 0.0–0.2)

## 2022-01-07 LAB — I-STAT CHEM 8, ED
BUN: 7 mg/dL — ABNORMAL LOW (ref 8–23)
Calcium, Ion: 1.09 mmol/L — ABNORMAL LOW (ref 1.15–1.40)
Chloride: 103 mmol/L (ref 98–111)
Creatinine, Ser: 0.7 mg/dL (ref 0.44–1.00)
Glucose, Bld: 99 mg/dL (ref 70–99)
HCT: 40 % (ref 36.0–46.0)
Hemoglobin: 13.6 g/dL (ref 12.0–15.0)
Potassium: 3.9 mmol/L (ref 3.5–5.1)
Sodium: 140 mmol/L (ref 135–145)
TCO2: 26 mmol/L (ref 22–32)

## 2022-01-07 LAB — HIV ANTIBODY (ROUTINE TESTING W REFLEX): HIV Screen 4th Generation wRfx: NONREACTIVE

## 2022-01-07 LAB — LACTIC ACID, PLASMA: Lactic Acid, Venous: 1 mmol/L (ref 0.5–1.9)

## 2022-01-07 MED ORDER — RISAQUAD PO CAPS
1.0000 | ORAL_CAPSULE | Freq: Every day | ORAL | Status: DC
  Administered 2022-01-07 – 2022-01-09 (×3): 1 via ORAL
  Filled 2022-01-07 (×3): qty 1

## 2022-01-07 MED ORDER — BISACODYL 5 MG PO TBEC: 5.0000 mg | DELAYED_RELEASE_TABLET | Freq: Every day | ORAL | Status: DC | PRN

## 2022-01-07 MED ORDER — METRONIDAZOLE 500 MG/100ML IV SOLN
500.0000 mg | Freq: Once | INTRAVENOUS | Status: AC
  Administered 2022-01-07: 500 mg via INTRAVENOUS
  Filled 2022-01-07: qty 100

## 2022-01-07 MED ORDER — METRONIDAZOLE 500 MG/100ML IV SOLN: 500.0000 mg | Freq: Two times a day (BID) | INTRAVENOUS | Status: DC

## 2022-01-07 MED ORDER — PENICILLIN V POTASSIUM 250 MG PO TABS: 500.0000 mg | ORAL_TABLET | Freq: Once | ORAL | Status: DC

## 2022-01-07 MED ORDER — SENNOSIDES-DOCUSATE SODIUM 8.6-50 MG PO TABS: 1.0000 | ORAL_TABLET | Freq: Every evening | ORAL | Status: DC | PRN

## 2022-01-07 MED ORDER — ACETAMINOPHEN 325 MG PO TABS
650.0000 mg | ORAL_TABLET | Freq: Four times a day (QID) | ORAL | Status: DC | PRN
  Administered 2022-01-07 – 2022-01-09 (×4): 650 mg via ORAL
  Filled 2022-01-07 (×4): qty 2

## 2022-01-07 MED ORDER — HYDROCODONE-ACETAMINOPHEN 5-325 MG PO TABS
1.0000 | ORAL_TABLET | ORAL | Status: DC | PRN
  Administered 2022-01-07: 1 via ORAL
  Filled 2022-01-07: qty 1

## 2022-01-07 MED ORDER — AMOXICILLIN-POT CLAVULANATE 875-125 MG PO TABS
1.0000 | ORAL_TABLET | Freq: Once | ORAL | Status: DC
  Filled 2022-01-07: qty 1

## 2022-01-07 MED ORDER — ACETAMINOPHEN 650 MG RE SUPP: 650.0000 mg | Freq: Four times a day (QID) | RECTAL | Status: DC | PRN

## 2022-01-07 MED ORDER — ONDANSETRON HCL 4 MG/2ML IJ SOLN
4.0000 mg | Freq: Four times a day (QID) | INTRAMUSCULAR | Status: DC | PRN
  Administered 2022-01-07: 4 mg via INTRAVENOUS
  Filled 2022-01-07: qty 2

## 2022-01-07 MED ORDER — ESTRADIOL 0.1 MG/GM VA CREA
1.0000 | TOPICAL_CREAM | VAGINAL | Status: DC
  Filled 2022-01-07: qty 42.5

## 2022-01-07 MED ORDER — BACID PO TABS: 2.0000 | ORAL_TABLET | Freq: Three times a day (TID) | ORAL | Status: DC

## 2022-01-07 MED ORDER — AMLODIPINE BESYLATE 5 MG PO TABS
5.0000 mg | ORAL_TABLET | Freq: Every day | ORAL | Status: DC
  Administered 2022-01-07 – 2022-01-09 (×3): 5 mg via ORAL
  Filled 2022-01-07 (×3): qty 1

## 2022-01-07 MED ORDER — IOHEXOL 350 MG/ML SOLN
75.0000 mL | Freq: Once | INTRAVENOUS | Status: AC | PRN
  Administered 2022-01-07: 75 mL via INTRAVENOUS

## 2022-01-07 MED ORDER — SODIUM CHLORIDE 0.9 % IV SOLN
2.0000 g | Freq: Two times a day (BID) | INTRAVENOUS | Status: DC
  Administered 2022-01-07 – 2022-01-09 (×5): 2 g via INTRAVENOUS
  Filled 2022-01-07 (×5): qty 12.5

## 2022-01-07 MED ORDER — IBRUTINIB 420 MG PO TABS
420.0000 mg | ORAL_TABLET | Freq: Every day | ORAL | Status: DC
  Administered 2022-01-07 – 2022-01-09 (×3): 420 mg via ORAL
  Filled 2022-01-07 (×3): qty 1

## 2022-01-07 MED ORDER — ACETAMINOPHEN 325 MG PO TABS
650.0000 mg | ORAL_TABLET | Freq: Once | ORAL | Status: AC
  Administered 2022-01-07: 650 mg via ORAL
  Filled 2022-01-07: qty 2

## 2022-01-07 NOTE — ED Triage Notes (Signed)
Pt here with c/o recurring swelling under the tongue and dental pain. Hx cancer, currently receiving treatment.

## 2022-01-07 NOTE — ED Provider Triage Note (Signed)
  Emergency Medicine Provider Triage Evaluation Note  MRN:  [id number]  Arrival date & time: 01/07/22    Medically screening exam initiated at 3:23 AM.   CC:   Oral Pain   HPI:  Kristy [name] is a 66 y.o. year-old female presents to the ED with chief complaint of floor of mouth swelling and pain.  Recent dental procedure.  Here with son, who is concerned about deep space infection.  Had been on Cefdinir and Augmentin for sinusitis about 2 weeks ago.  Immunocompromised.  Hx of Ca.Marland Kitchen  History provided by patient. ROS:  -As included in HPI PE:   Vitals:   01/07/22 0316 01/07/22 0317  BP: (!) 165/100   Pulse: 87   Resp: 18   Temp: 97.9 F (36.6 C)   SpO2: 97% 97%    Non-toxic appearing No respiratory distress Sublingual tenderness, ?sialadenitis  MDM:  Based on signs and symptoms, dental infection is highest on my differential, followed by salivary stone/infection. I've ordered labs and imaging in triage to expedite lab/diagnostic workup.  Patient was informed that the remainder of the evaluation will be completed by another provider, this initial triage assessment does not replace that evaluation, and the importance of remaining in the ED until their evaluation is complete.    Montine Circle, PA-C 01/07/22 [phone]

## 2022-01-07 NOTE — Progress Notes (Signed)
Pharmacy Antibiotic Note  Kristy [name] is a 66 y.o. female for which pharmacy has been consulted for cefepime dosing for  dental infection .  Patient with a history of Waldenstrom macroglobulinemia on immunotherapy of ibrutinib, hypogammaglobulinemia, recurrent sinusitis recently finishing 2-week antibiotic course of cefdinir and Augmentin . Patient presenting with dental pain on rt lower second most posterior molar w/ swelling beneath the tongue on that side as well as submandibular swelling.  SCr 0.7 WBC 8.3; T 97.7; HR 84; RR 18  Plan: Metronidazole per MD Cefepime 2g q12hr Trend WBC, Fever, Renal function F/u cultures, clinical course, WBC, fever De-escalate when able     Temp (24hrs), Avg:97.8 F (36.6 C), Min:97.7 F (36.5 C), Max:97.9 F (36.6 C)  Recent Labs  Lab 01/07/22 0329 01/07/22 0336  WBC 8.3  --   CREATININE  --  0.70    Estimated Creatinine Clearance: 51 mL/min (by C-G formula based on SCr of 0.7 mg/dL).    Allergies  Allergen Reactions   Nitrofurantoin Other (See Comments)   Ibuprofen Other (See Comments)    Can not take due to medication    Antimicrobials this admission: cefepime 10/21 >>  flagyl 10/21 >>   Microbiology results: Pending  Thank you for allowing pharmacy to be a part of this patient's care.  Lorelei Pont, PharmD, BCPS 01/07/2022 10:51 AM ED Clinical Pharmacist -  [phone]

## 2022-01-07 NOTE — ED Provider Notes (Signed)
[facility] EMERGENCY DEPARTMENT Provider Note   CSN: [id number] Arrival date & time: 01/07/22  0309     History  Chief Complaint  Patient presents with   Oral Pain    Kristy [name] is a 66 y.o. female.   Oral Pain   66 year old female presents emergency department with complaints of oral pain.  Patient has a history of Waldenstrom macroglobulinemia on immunotherapy of ibrutinib, hypogammaglobulinemia, recurrent sinusitis recently finishing 2-week antibiotic course of cefdinir and Augmentin subsequently. Patient states that she began to notice symptoms yesterday.  Symptoms began with dental pain on her right lower second most posterior molar.  She subsequently noted swelling beneath the tongue on that side as well as submandibular swelling.  She reports difficulty chewing secondary to pain.  She secondarily endorses difficulty swallowing due to difficulty moving her tongue.  Denies difficulty breathing,  difficulty maintaining oral secretions.  Patient reports taking at home Tylenol which has helped pain.  She states that since waiting in the emergency department, symptoms have gotten a little worse.  Denies any known fever, chills, night sweats, chest pain, shortness of breath, abdominal pain, nausea, vomiting.  Home Medications Prior to Admission medications   Medication Sig Start Date End Date Taking? Authorizing Provider  amLODipine (NORVASC) 5 MG tablet Take 5 mg by mouth daily. 01/24/21  Yes [provider]  cyanocobalamin (,VITAMIN B-12,) 1000 MCG/ML injection INJECT 1 ML INTO THE MUSCLE EVERY 2 MONTHS Patient taking differently: Inject 1,000 mcg into the skin every 8 (eight) weeks. 05/24/18  Yes [name], MD  estradiol (ESTRACE) 0.1 MG/GM vaginal cream Place 1 Applicatorful vaginally 3 (three) times a week. 06/04/20  Yes [provider]  ibrutinib (IMBRUVICA) 420 MG tablet TAKE 1 TABLET BY MOUTH DAILY AFTER BREAKFAST. Patient taking  differently: Take 420 mg by mouth daily. 06/03/21 06/03/22 Yes Ennever, Rudell Cobb, MD  Lifitegrast 5 % SOLN Place 1 drop into both eyes 2 (two) times daily.   Yes [provider]  nitrofurantoin, macrocrystal-monohydrate, (MACROBID) 100 MG capsule Take 100 mg by mouth daily as needed (For UTI). 08/05/20  Yes [provider]  cefdinir (OMNICEF) 300 MG capsule Take 300 mg by mouth 2 (two) times daily. Patient not taking: Reported on 01/07/2022 12/22/21   [provider]  Cholecalciferol 25 MCG (1000 UT) tablet Take 1,000 Units by mouth daily.    [provider]      Allergies    Grapefruit extract and Ibuprofen    Review of Systems   Review of Systems  All other systems reviewed and are negative.   Physical Exam Updated Vital Signs BP 138/77   Pulse 95   Temp 98.2 F (36.8 C) (Oral)   Resp 18   Ht 5' 0.24" (1.53 m)   Wt 48.4 kg   SpO2 93%   BMI 20.68 kg/m  Physical Exam Vitals and nursing note reviewed.  Constitutional:      General: She is not in acute distress.    Appearance: Normal appearance. She is well-developed.  HENT:     Head: Normocephalic and atraumatic.     Mouth/Throat:   Eyes:     Conjunctiva/sclera: Conjunctivae normal.  Neck:     Comments: Patient has mild submandibular swelling on the right which is mildly tender to palpation.  No overlying skin abnormalities noted.  Patient has tenderness to palpation of anterior and posterior gumline over affected molar indicated above.  Submandibular swelling noted bilaterally with noted induration on  the right side.Marland Kitchen  Uvula midline and rises symmetrically with phonation.  Tonsils 0-1+ bilaterally with no obvious erythema or exudate.  Tongue without obvious swelling or deformity. Cardiovascular:     Rate and Rhythm: Normal rate and regular rhythm.     Heart sounds: No murmur heard. Pulmonary:     Effort: Pulmonary effort is normal. No respiratory distress.     Breath sounds: Normal breath  sounds.  Abdominal:     Palpations: Abdomen is soft.     Tenderness: There is no abdominal tenderness.  Musculoskeletal:        General: No swelling.     Cervical back: Normal range of motion and neck supple. No rigidity.  Skin:    General: Skin is warm and dry.     Capillary Refill: Capillary refill takes less than 2 seconds.  Neurological:     Mental Status: She is alert.  Psychiatric:        Mood and Affect: Mood normal.      ED Results / Procedures / Treatments   Labs (all labs ordered are listed, but only abnormal results are displayed) Labs Reviewed  CBC WITH DIFFERENTIAL/PLATELET - Abnormal; Notable for the following components:      Result Value   MCV 102.3 (*)    MCH 34.8 (*)    Platelets 103 (*)    All other components within normal limits  I-STAT CHEM 8, ED - Abnormal; Notable for the following components:   BUN 7 (*)    Calcium, Ion 1.09 (*)    All other components within normal limits  CULTURE, BLOOD (ROUTINE X 2)  CULTURE, BLOOD (ROUTINE X 2)  LACTIC ACID, PLASMA  HIV ANTIBODY (ROUTINE TESTING W REFLEX)    EKG None  Radiology CT Soft Tissue Neck W Contrast  Result Date: 01/07/2022 CLINICAL DATA:  Soft tissue swelling with infection suspected EXAM: CT NECK WITH CONTRAST TECHNIQUE: Multidetector CT imaging of the neck was performed using the standard protocol following the bolus administration of intravenous contrast. RADIATION DOSE REDUCTION: This exam was performed according to the departmental dose-optimization program which includes automated exposure control, adjustment of the mA and/or kV according to patient size and/or use of iterative reconstruction technique. CONTRAST:  11m OMNIPAQUE IOHEXOL 350 MG/ML SOLN COMPARISON:  None Available. FINDINGS: Pharynx and larynx: Indistinct fat planes and mild soft tissue expansion in the floor of mouth anteriorly, greater towards the right. No asymmetric or abnormal salivary enhancement or calculus. No collection  or adjacent subperiosteal abscess. The airway is widely patent. Salivary glands: Symmetric and negative for stone or discrete inflammation. Thyroid: Normal Lymph nodes: Unremarkable Vascular: Unremarkable Limited intracranial: Negative Visualized orbits: Partial coverage is negative Mastoids and visualized paranasal sinuses: Clear Skeleton: C6-7 non segmentation with prominent adjacent degeneration. Notable foraminal impingement at C5-6. Upper chest: Biapical pleural based scarring.  No acute finding IMPRESSION: Nonspecific inflammation in the anterior floor of mouth, greater towards the right. No abscess or clear odontogenic/salivary source. Electronically Signed   By: JJorje GuildM.D.   On: 01/07/2022 05:18    Procedures Procedures    Medications Ordered in ED Medications  ceFEPIme (MAXIPIME) 2 g in sodium chloride 0.9 % 100 mL IVPB (0 g Intravenous Stopped 01/07/22 1205)  ibrutinib (IMBRUVICA) tablet 420 mg (has no administration in time range)  amLODipine (NORVASC) tablet 5 mg (has no administration in time range)  estradiol (ESTRACE) vaginal cream 1 Applicatorful (has no administration in time range)  acetaminophen (TYLENOL) tablet 650 mg (has no  administration in time range)    Or  acetaminophen (TYLENOL) suppository 650 mg (has no administration in time range)  HYDROcodone-acetaminophen (NORCO/VICODIN) 5-325 MG per tablet 1-2 tablet (1 tablet Oral Given 01/07/22 1402)  senna-docusate (Senokot-S) tablet 1 tablet (has no administration in time range)  bisacodyl (DULCOLAX) EC tablet 5 mg (has no administration in time range)  acidophilus (RISAQUAD) capsule 1 capsule (1 capsule Oral Given 01/07/22 1450)  iohexol (OMNIPAQUE) 350 MG/ML injection 75 mL (75 mLs Intravenous Contrast Given 01/07/22 0444)  acetaminophen (TYLENOL) tablet 650 mg (650 mg Oral Given 01/07/22 0928)  metroNIDAZOLE (FLAGYL) IVPB 500 mg (0 mg Intravenous Stopped 01/07/22 1327)    ED Course/ Medical Decision Making/  A&P Clinical Course as of 01/07/22 1631  Sat Jan 07, 2022  1010 Attending physician Dr. Ashok Cordia evaluated the patient independently.  He recommended consultation with ENT with expectant discharge with oral antibiotics. [CR]  1028 MCHC: 34.0 [CR]  41 Consulted Dr. Marcelline Deist of ENT.  He recommended admission for developing Ludwig angina for IV antibiotics and surgical intervention of the affected tooth. [CR]  1239 Consult hospital medicine Dr. Marland Kitchen  Regarding the patient.  He agreed with admission and assume further treatment/care. [CR]    Clinical Course User Index [CR] Wilnette Kales, PA                           Medical Decision Making Amount and/or Complexity of Data Reviewed Labs: ordered. Decision-making details documented in ED Course.  Risk OTC drugs. Prescription drug management. Decision regarding hospitalization.   This patient presents to the ED for concern of oral pain, this involves an extensive number of treatment options, and is a complaint that carries with it a high risk of complications and morbidity.  The differential diagnosis includes Ludwig angina, necrotizing ulcerative gingivitis, Lemierre's disease, peritonsillar abscess, periapical abscess, dental pain, cellulitis, anaphylaxis   Co morbidities that complicate the patient evaluation  See HPI   Additional history obtained:  Additional history obtained from EMR External records from outside source obtained and reviewed including office visits from primary care showing recurrent maxillary sinusitis antibiotic therapy.   Lab Tests:  I Ordered, and personally interpreted labs.  The pertinent results include: No leukocytosis.  No evidence of anemia.  Thrombocytopenia with platelets of 103 which is near patient's baseline.  No electrolyte abnormalities noted.  Renal function within normal limits.   Imaging Studies ordered:  I ordered imaging studies including CT soft tissue neck I independently  visualized and interpreted imaging which showed nonspecific inflammation in the anterior floor of mouth greater towards the right.  No abscess or clear odontogenic/salivary source. I agree with the radiologist interpretation  Cardiac Monitoring: / EKG:  The patient was maintained on a cardiac monitor.  I personally viewed and interpreted the cardiac monitored which showed an underlying rhythm of: Sinus rhythm   Consultations Obtained:  See ED course.  Problem List / ED Course / Critical interventions / Medication management  Ludwig angina  I ordered medication including Tylenol for pain.  Cefepime and Flagyl for antibiotic therapy.    Reevaluation of the patient after these medicines showed that the patient improved I have reviewed the patients home medicines and have made adjustments as needed   Social Determinants of Health:  Immunocompromised on immunotherapy.  Denies tobacco/illicit drug use.   Test / Admission - Considered:  Oral pain Vitals signs significant for hypertension with a blood pressure 152/75.  Recommend close  follow-up with PCP regarding elevation of blood pressure.. Otherwise within normal range and stable throughout visit. Laboratory/imaging studies significant for: See above Patient's symptoms concerning for Ludwig angina.  Admission deemed necessary.  Patient started on broad-spectrum antibiotics of cefepime and Flagyl given at baseline immunocompromise state.  ENT, dentistry as well as hospital medicine was consulted in manner depicted in ED course.  Treatment plan discussed at length with patient and family and they knowledge understanding were agreeable to said plan.  Patient was stable upon admission to the hospital.         Final Clinical Impression(s) / ED Diagnoses Final diagnoses:  Ludwig's angina    Rx / DC Orders ED Discharge Orders     None         Wilnette Kales, [place] 01/07/22 8403    Lajean Saver, MD 01/08/22 1007

## 2022-01-07 NOTE — Discharge Instructions (Signed)
As discussed, we will continue antibiotic therapy outpatient for your oral swelling and pain.  Recommend close follow-up with your dentist/oral surgeon for evaluation of affected tooth we discussed.  Please take antibiotics as directed.  Watch for signs of C. difficile given your multiple antibiotic usage over the past 1 to 2 months.  Blood pressure was also slightly elevated today so recommend close follow-up with your PCP regarding elevation of blood pressure if it remains persistent.  Please not hesitate to return to emergency department for worrisome signs and symptoms we discussed become apparent.  Additional Discharge Instructions   Please get your medications reviewed and adjusted by your Primary MD.  Please request your Primary MD to go over all Hospital Tests and Procedure/Radiological results at the follow up, please get all Hospital records sent to your Prim MD by signing hospital release before you go home.  If you had Pneumonia of Lung problems at the Hospital: Please get a 2 view Chest X ray done in approximately 4 weeks after hospital discharge or sooner if instructed by your Primary MD.  If you have Congestive Heart Failure: Please call your Cardiologist or Primary MD anytime you have any of the following symptoms:  1) 3 pound weight gain in 24 hours or 5 pounds in 1 week  2) shortness of breath, with or without a dry hacking cough  3) swelling in the hands, feet or stomach  4) if you have to sleep on extra pillows at night in order to breathe  Follow cardiac low salt diet and 1.5 lit/day fluid restriction.  If you have diabetes Accuchecks 4 times/day, Once in AM empty stomach and then before each meal. Log in all results and show them to your primary doctor at your next visit. If any glucose reading is under 80 or above 300 call your primary MD immediately.  If you have Seizure/Convulsions/Epilepsy: Please do not drive, operate heavy machinery, participate in activities at  heights or participate in high speed sports until you have seen by Primary MD or a Neurologist and advised to do so again. Per [facility] statutes, patients with seizures are not allowed to drive until they have been seizure-free for six months.  Use caution when using heavy equipment or power tools. Avoid working on ladders or at heights. Take showers instead of baths. Ensure the water temperature is not too high on the home water heater. Do not go swimming alone. Do not lock yourself in a room alone (i.e. bathroom). When caring for infants or small children, sit down when holding, feeding, or changing them to minimize risk of injury to the child in the event you have a seizure. Maintain good sleep hygiene. Avoid alcohol.   If you had Gastrointestinal Bleeding: Please ask your Primary MD to check a complete blood count within one week of discharge or at your next visit. Your endoscopic/colonoscopic biopsies that are pending at the time of discharge, will also need to followed by your Primary MD.  Get Medicines reviewed and adjusted. Please take all your medications with you for your next visit with your Primary MD  Please request your Primary MD to go over all hospital tests and procedure/radiological results at the follow up, please ask your Primary MD to get all Hospital records sent to his/her office.  If you experience worsening of your admission symptoms, develop shortness of breath, life threatening emergency, suicidal or homicidal thoughts you must seek medical attention immediately by calling 911 or calling your MD  immediately  if symptoms less severe.  You must read complete instructions/literature along with all the possible adverse reactions/side effects for all the Medicines you take and that have been prescribed to you. Take any new Medicines after you have completely understood and accpet all the possible adverse reactions/side effects.   Do not drive or operate heavy machinery  when taking Pain medications.   Do not take more than prescribed Pain, Sleep and Anxiety Medications  Special Instructions: If you have smoked or chewed Tobacco  in the last 2 yrs please stop smoking, stop any regular Alcohol  and or any Recreational drug use.  Wear Seat belts while driving.  Please note You were cared for by a hospitalist during your hospital stay. If you have any questions about your discharge medications or the care you received while you were in the hospital after you are discharged, you can call the unit and asked to speak with the hospitalist on call if the hospitalist that took care of you is not available. Once you are discharged, your primary care physician will handle any further medical issues. Please note that NO REFILLS for any discharge medications will be authorized once you are discharged, as it is imperative that you return to your primary care physician (or establish a relationship with a primary care physician if you do not have one) for your aftercare needs so that they can reassess your need for medications and monitor your lab values.  You can reach the hospitalist office at phone [phone] or fax [phone]   If you do not have a primary care physician, you can call [phone] for a physician referral.

## 2022-01-07 NOTE — H&P (Addendum)
History and Physical    Kristy [name] CLE:[id number] DOB: [birth year] DOA: 01/07/2022  PCP: Sharon Seller, MD (Confirm with patient/family/NH records and if not entered, this has to be entered at [facility] point of entry) Patient coming from: Home  I have personally briefly reviewed patient's old medical records in [place]  Chief Complaint: Toothache and tongue swelling  HPI: Kristy [name] is a 66 y.o. female with medical history significant of orders from Waldenstrom macroglobulinemia on chemotherapy, pernicious anemia on B12 injection, HTN, presented with recurrent toothache and tongue swelling.  Initially, patient had a right mandibular molar tooth infection underwent tooth crown treatment and crown in May 2023.  Since then she has had 2 infection with the crown completed 2 cycles of antibiotic treatment.  3 weeks ago, she had another episode of tooth canal infection of the same right mandible tooth, underwent 2 weeks of Omnicef treatment.  Last night, patient suddenly developed 10/10 toothache and tongue swelling, unable to swallow for regular pill this morning but denies any drooling or shortness of breath/throat closing no fever or chills.  ED Course: Afebrile, no hypoxia.  CT neck soft tissue with contrast showed no definitive abscess but inflammation of the anterior floor of the mouth right> left.  Patient was started on cefepime and Flagyl  Review of Systems: As per HPI otherwise 14 point review of systems negative.   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 She has never used smokeless tobacco. She reports that she does not drink alcohol and does not use drugs.  Allergies  Allergen  Reactions   Nitrofurantoin Other (See Comments)   Ibuprofen Other (See Comments)    Can not take due to medication    History reviewed. No pertinent family history.   Prior to Admission medications   Medication Sig Start Date End Date Taking? Authorizing Provider  amLODipine (NORVASC) 5 MG tablet Take 5 mg by mouth daily. 01/24/21   [provider]  cefdinir (OMNICEF) 300 MG capsule Take 300 mg by mouth 2 (two) times daily. 12/22/21   [provider]  Cholecalciferol 25 MCG (1000 UT) tablet Take by mouth daily.    [provider]  cyanocobalamin (,VITAMIN B-12,) 1000 MCG/ML injection INJECT 1 ML INTO THE MUSCLE EVERY 2 MONTHS 05/24/18   [name], MD  estradiol (ESTRACE) 0.1 MG/GM vaginal cream Place 1 Applicatorful vaginally 3 (three) times a week. 06/04/20   [provider]  ibrutinib (IMBRUVICA) 420 MG tablet TAKE 1 TABLET BY MOUTH DAILY AFTER BREAKFAST. 06/03/21 06/03/22  [name], MD  Lifitegrast 5 % SOLN Apply 1 drop to eye 2 (two) times daily.    [provider]  nitrofurantoin, macrocrystal-monohydrate, (MACROBID) 100 MG capsule 100 mg. As needed Patient not taking: Reported on 12/29/2021 08/05/20   [provider]    Physical Exam: Vitals:   01/07/22 0856 01/07/22 1015 01/07/22 1130 01/07/22 1200  BP: (!) 152/75 134/75 (!) 141/90 (!) 140/75  Pulse: 89 84 87 87  Resp: 15 18 (!) 26 (!) 24  Temp: 97.9 F (36.6 C)     TempSrc: Oral     SpO2: 100% 95% 95% 93%    Constitutional: NAD, calm, comfortable Vitals:  01/07/22 0856 01/07/22 1015 01/07/22 1130 01/07/22 1200  BP: (!) 152/75 134/75 (!) 141/90 (!) 140/75  Pulse: 89 84 87 87  Resp: 15 18 (!) 26 (!) 24  Temp: 97.9 F (36.6 C)     TempSrc: Oral     SpO2: 100% 95% 95% 93%   Eyes: PERRL, lids and conjunctivae normal ENMT: mouth floor swelling and tender, right>left, increased lymphadenopathy of right side of neck and right send neck fullness and  tender Neck: normal, supple, no masses, no thyromegaly Respiratory: clear to auscultation bilaterally, no wheezing, no crackles. Normal respiratory effort. No accessory muscle use.  Cardiovascular: Regular rate and rhythm, no murmurs / rubs / gallops. No extremity edema. 2+ pedal pulses. No carotid bruits.  Abdomen: no tenderness, no masses palpated. No hepatosplenomegaly. Bowel sounds positive.  Musculoskeletal: no clubbing / cyanosis. No joint deformity upper and lower extremities. Good ROM, no contractures. Normal muscle tone.  Skin: no rashes, lesions, ulcers. No induration Neurologic: CN 2-12 grossly intact. Sensation intact, DTR normal. Strength 5/5 in all 4.  Psychiatric: Normal judgment and insight. Alert and oriented x 3. Normal mood.     Labs on Admission: I have personally reviewed following labs and imaging studies  CBC: Recent Labs  Lab 01/07/22 0329 01/07/22 0336  WBC 8.3  --   NEUTROABS 5.9  --   HGB 13.8 13.6  HCT 40.6 40.0  MCV 102.3*  --   PLT 103*  --    Basic Metabolic Panel: Recent Labs  Lab 01/07/22 0336  NA 140  K 3.9  CL 103  GLUCOSE 99  BUN 7*  CREATININE 0.70   GFR: Estimated Creatinine Clearance: 51 mL/min (by C-G formula based on SCr of 0.7 mg/dL). Liver Function Tests: No results for input(s): "AST", "ALT", "ALKPHOS", "BILITOT", "PROT", "ALBUMIN" in the last 168 hours. No results for input(s): "LIPASE", "AMYLASE" in the last 168 hours. No results for input(s): "AMMONIA" in the last 168 hours. Coagulation Profile: No results for input(s): "INR", "PROTIME" in the last 168 hours. Cardiac Enzymes: No results for input(s): "CKTOTAL", "CKMB", "CKMBINDEX", "TROPONINI" in the last 168 hours. BNP (last 3 results) No results for input(s): "PROBNP" in the last 8760 hours. HbA1C: No results for input(s): "HGBA1C" in the last 72 hours. CBG: No results for input(s): "GLUCAP" in the last 168 hours. Lipid Profile: No results for input(s): "CHOL",  "HDL", "LDLCALC", "TRIG", "CHOLHDL", "LDLDIRECT" in the last 72 hours. Thyroid Function Tests: No results for input(s): "TSH", "T4TOTAL", "FREET4", "T3FREE", "THYROIDAB" in the last 72 hours. Anemia Panel: No results for input(s): "VITAMINB12", "FOLATE", "FERRITIN", "TIBC", "IRON", "RETICCTPCT" in the last 72 hours. Urine analysis:    Component Value Date/Time   COLORURINE STRAW (A) 06/03/2020 0906   APPEARANCEUR CLEAR 06/03/2020 0906   LABSPEC <1.005 (L) 06/03/2020 0906   PHURINE 6.0 06/03/2020 0906   GLUCOSEU NEGATIVE 06/03/2020 0906   HGBUR TRACE (A) 06/03/2020 0906   BILIRUBINUR NEGATIVE 06/03/2020 0906   BILIRUBINUR large (A) 11/09/2015 1447   KETONESUR NEGATIVE 06/03/2020 0906   PROTEINUR NEGATIVE 06/03/2020 0906   UROBILINOGEN 4.0 11/09/2015 1447   NITRITE NEGATIVE 06/03/2020 0906   LEUKOCYTESUR TRACE (A) 06/03/2020 0906    Radiological Exams on Admission: CT Soft Tissue Neck W Contrast  Result Date: 01/07/2022 CLINICAL DATA:  Soft tissue swelling with infection suspected EXAM: CT NECK WITH CONTRAST TECHNIQUE: Multidetector CT imaging of the neck was performed using the standard protocol following the bolus administration of intravenous contrast. RADIATION DOSE REDUCTION: This exam was  performed according to the departmental dose-optimization program which includes automated exposure control, adjustment of the mA and/or kV according to patient size and/or use of iterative reconstruction technique. CONTRAST:  79m OMNIPAQUE IOHEXOL 350 MG/ML SOLN COMPARISON:  None Available. FINDINGS: Pharynx and larynx: Indistinct fat planes and mild soft tissue expansion in the floor of mouth anteriorly, greater towards the right. No asymmetric or abnormal salivary enhancement or calculus. No collection or adjacent subperiosteal abscess. The airway is widely patent. Salivary glands: Symmetric and negative for stone or discrete inflammation. Thyroid: Normal Lymph nodes: Unremarkable Vascular:  Unremarkable Limited intracranial: Negative Visualized orbits: Partial coverage is negative Mastoids and visualized paranasal sinuses: Clear Skeleton: C6-7 non segmentation with prominent adjacent degeneration. Notable foraminal impingement at C5-6. Upper chest: Biapical pleural based scarring.  No acute finding IMPRESSION: Nonspecific inflammation in the anterior floor of mouth, greater towards the right. No abscess or clear odontogenic/salivary source. Electronically Signed   By: JJorje GuildM.D.   On: 01/07/2022 05:18    EKG: None  Assessment/Plan Principal Problem:   Angina, Ludwig Active Problems:   Ludwig's angina  (please populate well all problems here in Problem List. (For example, if patient is on BP meds at home and you resume or decide to hold them, it is a problem that needs to be her. Same for CAD, COPD, HLD and so on)  Recurrent right sided molar tooth and mouth floor infection -Failed recent outpatient antibiotics treatment -Clinically, patient does not have drooling, no compromise of breathing but did have some swallowing problems this morning due to swelling at home which family reported improved after ED treatment.  Image study CT neck soft tissue did not show significant abscess, no indication for surgical intervention at this point. -Given that the patient is chronically immune compromised with Waldenstrom macroglobulinemia and on chemotherapy with chronic IgA deficiency, likelihood of develop resistant infection is high and degeneration to Ludwig's angina is high if not treated.  And unlikely patient will launch scale inflammatory reaction such as fever or leukocytosis.  Decided to broaden coverage with cefepime and Flagyl for now.  Expect that if patient's symptoms stable or improved over next 24-48 hours, likely can be discharged home with p.o. antibiotics and patient is already scheduled to see oral surgery on Tuesday.  If her symptoms progress, consider repeat CAT scan and  consult oral surgeon.  Explained plan to family and patient at bedside, both expressed understanding and agreed. -Blood culture x2  Waldenstrom macroglobulinemia -Continue current chemotherapy/immunotherapy Ibrutinib  Chronic thrombocytopenia -Likely related to her chemotherapy drug, chronic and stable  HTN -Continue amlodipine  Pernicious anemia -B12 injection every 2 weeks   DVT prophylaxis: SCD Code Status: Full code Family Communication: Son at bedside Disposition Plan: Patient sick with recurrent months for infection failed outpatient management with baseline immunocompromise, requiring IV antibiotics, expect more than 2 midnight hospital stay. Consults called: None Admission status: MedSurg admission   PLequita HaltMD [facility] Pager [phone] 01/07/2022, 12:59 PM

## 2022-01-08 DIAGNOSIS — K122 Cellulitis and abscess of mouth: Secondary | ICD-10-CM | POA: Diagnosis not present

## 2022-01-08 LAB — CBC
HCT: 37.7 % (ref 36.0–46.0)
Hemoglobin: 12.3 g/dL (ref 12.0–15.0)
MCH: 34.5 pg — ABNORMAL HIGH (ref 26.0–34.0)
MCHC: 32.6 g/dL (ref 30.0–36.0)
MCV: 105.6 fL — ABNORMAL HIGH (ref 80.0–100.0)
Platelets: 91 10*3/uL — ABNORMAL LOW (ref 150–400)
RBC: 3.57 MIL/uL — ABNORMAL LOW (ref 3.87–5.11)
RDW: 12.4 % (ref 11.5–15.5)
WBC: 6.2 10*3/uL (ref 4.0–10.5)
nRBC: 0 % (ref 0.0–0.2)

## 2022-01-08 LAB — BASIC METABOLIC PANEL
Anion gap: 7 (ref 5–15)
BUN: 9 mg/dL (ref 8–23)
CO2: 25 mmol/L (ref 22–32)
Calcium: 8.7 mg/dL — ABNORMAL LOW (ref 8.9–10.3)
Chloride: 109 mmol/L (ref 98–111)
Creatinine, Ser: 0.69 mg/dL (ref 0.44–1.00)
GFR, Estimated: >60 mL/min (ref >60)
Glucose, Bld: 92 mg/dL (ref 70–99)
Potassium: 4 mmol/L (ref 3.5–5.1)
Sodium: 141 mmol/L (ref 135–145)

## 2022-01-08 MED ORDER — METRONIDAZOLE 500 MG/100ML IV SOLN
500.0000 mg | Freq: Two times a day (BID) | INTRAVENOUS | Status: DC
  Administered 2022-01-08 – 2022-01-09 (×4): 500 mg via INTRAVENOUS
  Filled 2022-01-08 (×4): qty 100

## 2022-01-08 MED ORDER — ENOXAPARIN SODIUM 30 MG/0.3ML IJ SOSY
30.0000 mg | PREFILLED_SYRINGE | INTRAMUSCULAR | Status: DC
  Filled 2022-01-08: qty 0.3

## 2022-01-08 NOTE — Progress Notes (Signed)
PROGRESS NOTE   Kristy [name]  ZOX:[id number]    DOB: 10/29/1955    DOA: 01/07/2022  PCP: Sharon Seller, MD   I have briefly reviewed patients previous medical records in [facility].  Chief Complaint  Patient presents with   Oral Pain    Brief Narrative:  66 year old female with PMH of Waldenstrom macroglobulinemia on immunotherapy of ibrutinib, hypogammaglobulinemia, recurrent sinusitis, recently finished 2 weeks antibiotic course of cefdinir and Augmentin, presented to ED due to 2 days history of oral pain.  Symptoms reportedly started as dental pain on her right lower molar, followed by swelling beneath the tongue on that side as well as submandibular swelling, difficulty chewing secondary to pain and difficulty swallowing due to painful tongue movements.  Admitted for cellulitis of soft tissue of floor of the mouth in an immunosuppressed patient.  On IV antibiotics.  Improving.  Possible DC home 10/23.   Assessment & Plan:  Principal Problem:   Angina, Ludwig Active Problems:   Ludwig's angina   Cellulitis of oral cavity: Mild cellulitis of anterior floor of the mouth underneath the tongue.  CT soft tissue neck 10/21: Nonspecific inflammation in the anterior floor of the mouth, greater towards the right.  No abscess or clear odontogenic/salivary source.  ED PA noted that patient had mild swelling/soreness in the area of bilateral sublingual/sublingual glands with scant purulent drainage bilaterally and no fluctuance.  EDP discussed with Dr. Marcelline Deist, ENT who recommended hospital admission for developing Ludwig's angina for IV antibiotics and surgical intervention of affected tooth.  Based on CT above, no obvious dental source although likely the cause.  Improving on empiric IV cefepime and metronidazole.  No stridor and patient tolerating diet.  Improving.  Continue IV antibiotics for additional 24 hours and then can DC on oral antibiotics (plan to discuss with ID MD in a.m.)  with close outpatient follow-up with her dental surgeon (Dr. Orland Mustard).  It appears that patient has chronic teeth related issues.  Blood cultures x2: Negative to date.  Waldenstrom macroglobulinemia: Continue current immunotherapy/ibrutinib.  Follows with medical oncology/Dr. Marin Olp.  Chronic thrombocytopenia: Stable.  Essential hypertension: Controlled on amlodipine 5 Mg daily.  Pernicious anemia: Continue B12 injections every 2 weeks.  Body mass index is 20.68 kg/m.    DVT prophylaxis: SCDs Start: 01/07/22 1254     Code Status: Full Code:  Family Communication: Daughter at bedside Disposition:  Status is: Inpatient Remains inpatient appropriate because: IV antibiotics     Consultants:     Procedures:     Antimicrobials:   As above   Subjective:  Overall feels better.  Decreased pain and swelling underneath the tongue, underneath her right jaw.  Able to swallow better.  No difficulty swallowing or breathing.  Objective:   Vitals:   01/08/22 0023 01/08/22 0435 01/08/22 0847 01/08/22 1239  BP: 115/69 129/75 117/71 133/66  Pulse: 78 73 97 84  Resp: '16 16 17 16  '$ Temp: 98.6 F (37 C) 98.6 F (37 C) 98.6 F (37 C) 97.8 F (36.6 C)  TempSrc: Oral Oral Oral Oral  SpO2: 93% 94% 95% 94%  Weight:      Height:        General exam: Middle-age female, moderately built and thinly nourished sitting up comfortably in bed eating breakfast. ENT: Mild bilateral sublingual fold swelling without significant inflammation and no drainage.  No other swellings.  Tongue appears normal in size.  No clinical gingivitis.  Multiple teeth with crowns or filling.  No  obvious caries Respiratory system: Clear to auscultation. Respiratory effort normal. Cardiovascular system: S1 & S2 heard, RRR. No JVD, murmurs, rubs, gallops or clicks. No pedal edema. Gastrointestinal system: Abdomen is nondistended, soft and nontender. No organomegaly or masses felt. Normal bowel sounds heard. Central  nervous system: Alert and oriented. No focal neurological deficits. Extremities: Symmetric 5 x 5 power. Skin: No rashes, lesions or ulcers Psychiatry: Judgement and insight appear normal. Mood & affect appropriate.     Data Reviewed:   I have personally reviewed following labs and imaging studies   CBC: Recent Labs  Lab 01/07/22 0329 01/07/22 0336 01/08/22 0351  WBC 8.3  --  6.2  NEUTROABS 5.9  --   --   HGB 13.8 13.6 12.3  HCT 40.6 40.0 37.7  MCV 102.3*  --  105.6*  PLT 103*  --  91*    Basic Metabolic Panel: Recent Labs  Lab 01/07/22 0336 01/08/22 0351  NA 140 141  K 3.9 4.0  CL 103 109  CO2  --  25  GLUCOSE 99 92  BUN 7* 9  CREATININE 0.70 0.69  CALCIUM  --  8.7*    Liver Function Tests: No results for input(s): "AST", "ALT", "ALKPHOS", "BILITOT", "PROT", "ALBUMIN" in the last 168 hours.  CBG: No results for input(s): "GLUCAP" in the last 168 hours.  Microbiology Studies:   Recent Results (from the past 240 hour(s))  Blood culture (routine x 2)     Status: None (Preliminary result)   Collection Time: 01/07/22 11:11 AM   Specimen: BLOOD RIGHT HAND  Result Value Ref Range Status   Specimen Description BLOOD RIGHT HAND  Final   Special Requests   Final    BOTTLES DRAWN AEROBIC ONLY Blood Culture adequate volume   Culture   Final    NO GROWTH < 24 HOURS Performed at [facility], [address], [place], [place] [address]    Report Status PENDING  Incomplete  Blood culture (routine x 2)     Status: None (Preliminary result)   Collection Time: 01/07/22 11:11 AM   Specimen: BLOOD  Result Value Ref Range Status   Specimen Description BLOOD LEFT ANTECUBITAL  Final   Special Requests   Final    BOTTLES DRAWN AEROBIC AND ANAEROBIC Blood Culture adequate volume   Culture   Final    NO GROWTH < 24 HOURS Performed at [facility], [place] [address], [place], [place] [address]    Report Status PENDING  Incomplete    Radiology Studies:  CT Soft  Tissue Neck W Contrast  Result Date: 01/07/2022 CLINICAL DATA:  Soft tissue swelling with infection suspected EXAM: CT NECK WITH CONTRAST TECHNIQUE: Multidetector CT imaging of the neck was performed using the standard protocol following the bolus administration of intravenous contrast. RADIATION DOSE REDUCTION: This exam was performed according to the departmental dose-optimization program which includes automated exposure control, adjustment of the mA and/or kV according to patient size and/or use of iterative reconstruction technique. CONTRAST:  46m OMNIPAQUE IOHEXOL 350 MG/ML SOLN COMPARISON:  None Available. FINDINGS: Pharynx and larynx: Indistinct fat planes and mild soft tissue expansion in the floor of mouth anteriorly, greater towards the right. No asymmetric or abnormal salivary enhancement or calculus. No collection or adjacent subperiosteal abscess. The airway is widely patent. Salivary glands: Symmetric and negative for stone or discrete inflammation. Thyroid: Normal Lymph nodes: Unremarkable Vascular: Unremarkable Limited intracranial: Negative Visualized orbits: Partial coverage is negative Mastoids and visualized paranasal sinuses:  Clear Skeleton: C6-7 non segmentation with prominent adjacent degeneration. Notable foraminal impingement at C5-6. Upper chest: Biapical pleural based scarring.  No acute finding IMPRESSION: Nonspecific inflammation in the anterior floor of mouth, greater towards the right. No abscess or clear odontogenic/salivary source. Electronically Signed   By: Jorje Guild M.D.   On: 01/07/2022 05:18    Scheduled Meds:    acidophilus  1 capsule Oral Daily   amLODipine  5 mg Oral Daily   [START ON 01/09/2022] estradiol  1 Applicatorful Vaginal Once per day on Mon Wed Fri   ibrutinib  420 mg Oral Daily    Continuous Infusions:    ceFEPime (MAXIPIME) IV 2 g (01/08/22 1119)   metronidazole 500 mg (01/08/22 0913)     LOS: 1 day     Vernell Leep, MD,  FACP,  FHM, SFHM, [facility], [place]     To contact the attending provider between 7A-7P or the covering provider during after hours 7P-7A, please log into the web site [email or web address] and access using universal [place] password for that web site. If you do not have the password, please call the hospital operator.  01/08/2022, 1:42 PM

## 2022-01-08 NOTE — Plan of Care (Signed)

## 2022-01-09 DIAGNOSIS — K122 Cellulitis and abscess of mouth: Secondary | ICD-10-CM | POA: Diagnosis not present

## 2022-01-09 LAB — CBC
HCT: 36.8 % (ref 36.0–46.0)
Hemoglobin: 12.5 g/dL (ref 12.0–15.0)
MCH: 35 pg — ABNORMAL HIGH (ref 26.0–34.0)
MCHC: 34 g/dL (ref 30.0–36.0)
MCV: 103.1 fL — ABNORMAL HIGH (ref 80.0–100.0)
Platelets: 92 10*3/uL — ABNORMAL LOW (ref 150–400)
RBC: 3.57 MIL/uL — ABNORMAL LOW (ref 3.87–5.11)
RDW: 12.2 % (ref 11.5–15.5)
WBC: 4.5 10*3/uL (ref 4.0–10.5)
nRBC: 0 % (ref 0.0–0.2)

## 2022-01-09 LAB — COLOGUARD
COLOGUARD: NEGATIVE
COLOGUARD: NEGATIVE

## 2022-01-09 LAB — EXTERNAL GENERIC LAB PROCEDURE: COLOGUARD: NEGATIVE

## 2022-01-09 MED ORDER — AMOXICILLIN-POT CLAVULANATE 875-125 MG PO TABS: 1.0000 | ORAL_TABLET | Freq: Two times a day (BID) | ORAL | 0 refills | Status: AC

## 2022-01-09 NOTE — Plan of Care (Signed)

## 2022-01-09 NOTE — Discharge Summary (Signed)
Physician Discharge Summary  Kristy [name] OFB:[id number] DOB: 01/16/1956  PCP: Sharon Seller, MD  Admitted from: Home Discharged to: Home  Admit date: 01/07/2022 Discharge date: 01/09/2022  Recommendations for Outpatient Follow-up:    Follow-up Information     Dr. Lupe Carney. Schedule an appointment as soon as possible for a visit.   Why: Call for an appointment to be seen as soon as able, pertaining to your mouth pain. Contact information: [facility] [address], [place], [place] [address] [facility]  Phone [phone]        Devin Going T, MD. Schedule an appointment as soon as possible for a visit in 1 week(s).   Specialty: Family Medicine Why: To be seen with repeat labs (CBC & BMP). Contact information: [address] [place] [address] [phone]         [name], MD. Schedule an appointment as soon as possible for a visit.   Specialty: Oncology Why: Call for an appointment to be seen as soon as able. Contact information: [place] [address] [phone]                  Home Health: None    Equipment/Devices: None    Discharge Condition: Improved and stable.   Code Status: Full Code Diet recommendation:  Discharge Diet Orders (From admission, onward)     Start     Ordered   01/09/22 0000  Diet - low sodium heart healthy        01/09/22 1310             Discharge Diagnoses:  Principal Problem:   Cellulitis of floor of mouth    Brief Summary: 66 year old female with PMH of Waldenstrom macroglobulinemia on immunotherapy of ibrutinib, hypogammaglobulinemia, recurrent sinusitis, recently finished 2 weeks antibiotic course of cefdinir and Augmentin, presented to ED due to 2 days history of oral pain.  Symptoms reportedly started as dental pain on her right lower molar, followed by swelling beneath the tongue on that side as well as  submandibular swelling, difficulty chewing secondary to pain and difficulty swallowing due to painful tongue movements.  Admitted for cellulitis of soft tissue of floor of the mouth in an immunosuppressed patient.  On IV antibiotics.  Improved.     Assessment & Plan:    Cellulitis of oral cavity: Mild cellulitis of anterior floor of the mouth underneath the tongue.  CT soft tissue neck 10/21: Nonspecific inflammation in the anterior floor of the mouth, greater towards the right.  No abscess or clear odontogenic/salivary source.  ED PA noted that patient had mild swelling/soreness in the area of bilateral sublingual/sublingual glands with scant purulent drainage bilaterally and no fluctuance.  EDP discussed with Dr. Marcelline Deist, ENT who recommended hospital admission for developing Ludwig's angina for IV antibiotics and surgical intervention of affected tooth.  Based on CT above, no obvious dental source although could be the cause.  Improved on empiric IV cefepime and metronidazole x48 hours.  No stridor and patient tolerating diet. It appears that patient has chronic teeth related issues.  Blood cultures x2: Negative to date.  Communicated with infectious disease MD on-call who reviewed patient's chart and recommended transitioning to Augmentin 875 Mg twice daily to complete total 10-day course.  ID also felt that the diagnosis was kind of atypical.  They also noted that ibrutinib can cause stomatitis (~14-29% stomatitis, ~14% oropharyngeal pain).  Recommend patient follow-up closely  with both his dental surgeon and hematology/oncologist.  She verbalized understanding.   Waldenstrom macroglobulinemia: Continue current immunotherapy/ibrutinib.  Follows with medical oncology/Dr. Marin Olp.   Chronic thrombocytopenia: Stable.   Essential hypertension: Controlled on amlodipine 5 Mg daily.   Pernicious anemia: Continue B12 injections every 2 weeks.   Body mass index is 20.68 kg/m.  Consultants:    None   Procedures:   None   Discharge Instructions  Discharge Instructions     Call MD for:   Complete by: As directed    Recurrent or worsening mouth swelling or pain.   Call MD for:  difficulty breathing, headache or visual disturbances   Complete by: As directed    Call MD for:  extreme fatigue   Complete by: As directed    Call MD for:  persistant dizziness or light-headedness   Complete by: As directed    Call MD for:  persistant nausea and vomiting   Complete by: As directed    Call MD for:  redness, tenderness, or signs of infection (pain, swelling, redness, odor or green/yellow discharge around incision site)   Complete by: As directed    Call MD for:  severe uncontrolled pain   Complete by: As directed    Call MD for:  temperature >100.4   Complete by: As directed    Diet - low sodium heart healthy   Complete by: As directed    Increase activity slowly   Complete by: As directed         Medication List     STOP taking these medications    cefdinir 300 MG capsule Commonly known as: OMNICEF       TAKE these medications    amLODipine 5 MG tablet Commonly known as: NORVASC Take 5 mg by mouth daily.   amoxicillin-clavulanate 875-125 MG tablet Commonly known as: AUGMENTIN Take 1 tablet by mouth 2 (two) times daily for 8 days.   Cholecalciferol 25 MCG (1000 UT) tablet Take 1,000 Units by mouth daily.   cyanocobalamin 1000 MCG/ML injection Commonly known as: VITAMIN B12 INJECT 1 ML INTO THE MUSCLE EVERY 2 MONTHS What changed: See the new instructions.   estradiol 0.1 MG/GM vaginal cream Commonly known as: ESTRACE Place 1 Applicatorful vaginally 3 (three) times a week.   Imbruvica 420 MG tablet Generic drug: ibrutinib TAKE 1 TABLET BY MOUTH DAILY AFTER BREAKFAST. What changed:  how much to take how to take this when to take this   Lifitegrast 5 % Soln Place 1 drop into both eyes 2 (two) times daily.   nitrofurantoin  (macrocrystal-monohydrate) 100 MG capsule Commonly known as: MACROBID Take 100 mg by mouth daily as needed (For UTI).       Allergies  Allergen Reactions   Grapefruit Extract     unknown   Ibuprofen Other (See Comments)    Can not take due to medication      Procedures/Studies: CT Soft Tissue Neck W Contrast  Result Date: 01/07/2022 CLINICAL DATA:  Soft tissue swelling with infection suspected EXAM: CT NECK WITH CONTRAST TECHNIQUE: Multidetector CT imaging of the neck was performed using the standard protocol following the bolus administration of intravenous contrast. RADIATION DOSE REDUCTION: This exam was performed according to the departmental dose-optimization program which includes automated exposure control, adjustment of the mA and/or kV according to patient size and/or use of iterative reconstruction technique. CONTRAST:  45m OMNIPAQUE IOHEXOL 350 MG/ML SOLN COMPARISON:  None Available. FINDINGS: Pharynx and larynx: Indistinct fat planes and mild soft  tissue expansion in the floor of mouth anteriorly, greater towards the right. No asymmetric or abnormal salivary enhancement or calculus. No collection or adjacent subperiosteal abscess. The airway is widely patent. Salivary glands: Symmetric and negative for stone or discrete inflammation. Thyroid: Normal Lymph nodes: Unremarkable Vascular: Unremarkable Limited intracranial: Negative Visualized orbits: Partial coverage is negative Mastoids and visualized paranasal sinuses: Clear Skeleton: C6-7 non segmentation with prominent adjacent degeneration. Notable foraminal impingement at C5-6. Upper chest: Biapical pleural based scarring.  No acute finding IMPRESSION: Nonspecific inflammation in the anterior floor of mouth, greater towards the right. No abscess or clear odontogenic/salivary source. Electronically Signed   By: Jorje Guild M.D.   On: 01/07/2022 05:18      Subjective: Feels better.  Minimal pain, significantly improved  compared to admission.  Swelling resolved.  Tolerating p.o. well.  No fevers or dyspnea.  Discharge Exam:  Vitals:   01/08/22 1806 01/08/22 2002 01/09/22 0405 01/09/22 0852  BP: 134/73 136/72 135/77 (!) 148/86  Pulse: 83 84 70 77  Resp: '16 17 15 16  '$ Temp: 98.5 F (36.9 C) 98.4 F (36.9 C) 98 F (36.7 C) 97.6 F (36.4 C)  TempSrc: Oral Oral Oral Oral  SpO2: 95% 96% 94% 96%  Weight:      Height:        General exam: Middle-age female, moderately built and thinly nourished sitting up comfortably in bed eating breakfast. ENT: Mild bilateral sublingual fold swelling, right greater than sign left with some induration and minimal tenderness to palpation, without significant inflammation and no drainage at this time.  No other swellings.  Tongue appears normal in size.  Clinical no gingivitis.  Multiple teeth with crowns or filling.  No obvious caries Respiratory system: Clear to auscultation. Respiratory effort normal. Cardiovascular system: S1 & S2 heard, RRR. No JVD, murmurs, rubs, gallops or clicks. No pedal edema. Gastrointestinal system: Abdomen is nondistended, soft and nontender. No organomegaly or masses felt. Normal bowel sounds heard. Central nervous system: Alert and oriented. No focal neurological deficits. Extremities: Symmetric 5 x 5 power. Skin: No rashes, lesions or ulcers Psychiatry: Judgement and insight appear normal. Mood & affect appropriate.     The results of significant diagnostics from this hospitalization (including imaging, microbiology, ancillary and laboratory) are listed below for reference.     Microbiology: Recent Results (from the past 240 hour(s))  Blood culture (routine x 2)     Status: None (Preliminary result)   Collection Time: 01/07/22 11:11 AM   Specimen: BLOOD RIGHT HAND  Result Value Ref Range Status   Specimen Description BLOOD RIGHT HAND  Final   Special Requests   Final    BOTTLES DRAWN AEROBIC ONLY Blood Culture adequate volume    Culture   Final    NO GROWTH 2 DAYS Performed at [facility], [address], [place], [place] [address]    Report Status PENDING  Incomplete  Blood culture (routine x 2)     Status: None (Preliminary result)   Collection Time: 01/07/22 11:11 AM   Specimen: BLOOD  Result Value Ref Range Status   Specimen Description BLOOD LEFT ANTECUBITAL  Final   Special Requests   Final    BOTTLES DRAWN AEROBIC AND ANAEROBIC Blood Culture adequate volume   Culture   Final    NO GROWTH 2 DAYS Performed at [facility], [place] [address], [place], [place] [address]    Report Status PENDING  Incomplete     Labs: CBC: Recent  Labs  Lab 01/07/22 0329 01/07/22 0336 01/08/22 0351 01/09/22 0354  WBC 8.3  --  6.2 4.5  NEUTROABS 5.9  --   --   --   HGB 13.8 13.6 12.3 12.5  HCT 40.6 40.0 37.7 36.8  MCV 102.3*  --  105.6* 103.1*  PLT 103*  --  91* 92*    Basic Metabolic Panel: Recent Labs  Lab 01/07/22 0336 01/08/22 0351  NA 140 141  K 3.9 4.0  CL 103 109  CO2  --  25  GLUCOSE 99 92  BUN 7* 9  CREATININE 0.70 0.69  CALCIUM  --  8.7*    Discussed with patient's son who states that he is a Designer, jewellery, updated care and answered all questions.  Time coordinating discharge: 25 minutes  SIGNED:  Vernell Leep, MD,  FACP, [facility], [facility], [facility], [facility]   [facility] [place]     To contact the attending provider between 7A-7P or the covering provider during after hours 7P-7A, please log into the web site [email or web address] and access using universal [place] password for that web site. If you do not have the password, please call the hospital operator.

## 2022-01-09 NOTE — Plan of Care (Signed)

## 2022-01-12 LAB — CULTURE, BLOOD (ROUTINE X 2)
Culture: NO GROWTH
Culture: NO GROWTH
Special Requests: ADEQUATE
Special Requests: ADEQUATE

## 2022-01-16 ENCOUNTER — Other Ambulatory Visit (HOSPITAL_COMMUNITY): Payer: Self-pay

## 2022-01-25 ENCOUNTER — Other Ambulatory Visit (HOSPITAL_COMMUNITY): Payer: Self-pay

## 2022-02-08 ENCOUNTER — Encounter: Payer: Self-pay | Admitting: Hematology & Oncology

## 2022-02-08 ENCOUNTER — Other Ambulatory Visit: Payer: Self-pay

## 2022-02-08 ENCOUNTER — Inpatient Hospital Stay: Payer: Federal, State, Local not specified - PPO | Attending: Hematology & Oncology

## 2022-02-08 ENCOUNTER — Inpatient Hospital Stay: Payer: Federal, State, Local not specified - PPO

## 2022-02-08 ENCOUNTER — Inpatient Hospital Stay (HOSPITAL_BASED_OUTPATIENT_CLINIC_OR_DEPARTMENT_OTHER): Payer: Federal, State, Local not specified - PPO | Admitting: Hematology & Oncology

## 2022-02-08 ENCOUNTER — Telehealth: Payer: Self-pay | Admitting: *Deleted

## 2022-02-08 VITALS — BP 129/66 | HR 82 | Temp 97.9°F | Resp 16

## 2022-02-08 VITALS — BP 132/67 | HR 72 | Temp 98.0°F | Resp 18

## 2022-02-08 DIAGNOSIS — D510 Vitamin B12 deficiency anemia due to intrinsic factor deficiency: Secondary | ICD-10-CM

## 2022-02-08 DIAGNOSIS — C880 Waldenstrom macroglobulinemia: Secondary | ICD-10-CM | POA: Diagnosis not present

## 2022-02-08 DIAGNOSIS — J0101 Acute recurrent maxillary sinusitis: Secondary | ICD-10-CM

## 2022-02-08 DIAGNOSIS — D801 Nonfamilial hypogammaglobulinemia: Secondary | ICD-10-CM | POA: Diagnosis not present

## 2022-02-08 DIAGNOSIS — Z79899 Other long term (current) drug therapy: Secondary | ICD-10-CM | POA: Diagnosis not present

## 2022-02-08 DIAGNOSIS — J329 Chronic sinusitis, unspecified: Secondary | ICD-10-CM | POA: Insufficient documentation

## 2022-02-08 LAB — CMP (CANCER CENTER ONLY)
ALT: 18 U/L (ref 0–44)
AST: 26 U/L (ref 15–41)
Albumin: 4.9 g/dL (ref 3.5–5.0)
Alkaline Phosphatase: 64 U/L (ref 38–126)
Anion gap: 9 (ref 5–15)
BUN: 9 mg/dL (ref 8–23)
CO2: 30 mmol/L (ref 22–32)
Calcium: 9.5 mg/dL (ref 8.9–10.3)
Chloride: 103 mmol/L (ref 98–111)
Creatinine: 0.76 mg/dL (ref 0.44–1.00)
GFR, Estimated: >60 mL/min (ref >60)
Glucose, Bld: 97 mg/dL (ref 70–99)
Potassium: 3.7 mmol/L (ref 3.5–5.1)
Sodium: 142 mmol/L (ref 135–145)
Total Bilirubin: 1.1 mg/dL (ref 0.3–1.2)
Total Protein: 6.9 g/dL (ref 6.5–8.1)

## 2022-02-08 LAB — CBC WITH DIFFERENTIAL/PLATELET
Abs Immature Granulocytes: 0.09 10*3/uL — ABNORMAL HIGH (ref 0.00–0.07)
Basophils Absolute: 0 10*3/uL (ref 0.0–0.1)
Basophils Relative: 0 %
Eosinophils Absolute: 0.1 10*3/uL (ref 0.0–0.5)
Eosinophils Relative: 1 %
HCT: 44.8 % (ref 36.0–46.0)
Hemoglobin: 14.9 g/dL (ref 12.0–15.0)
Immature Granulocytes: 1 %
Lymphocytes Relative: 26 %
Lymphs Abs: 1.8 10*3/uL (ref 0.7–4.0)
MCH: 34.3 pg — ABNORMAL HIGH (ref 26.0–34.0)
MCHC: 33.3 g/dL (ref 30.0–36.0)
MCV: 103.2 fL — ABNORMAL HIGH (ref 80.0–100.0)
Monocytes Absolute: 0.4 10*3/uL (ref 0.1–1.0)
Monocytes Relative: 6 %
Neutro Abs: 4.5 10*3/uL (ref 1.7–7.7)
Neutrophils Relative %: 66 %
Platelets: 121 10*3/uL — ABNORMAL LOW (ref 150–400)
RBC: 4.34 MIL/uL (ref 3.87–5.11)
RDW: 12.5 % (ref 11.5–15.5)
WBC: 6.9 10*3/uL (ref 4.0–10.5)
nRBC: 0 % (ref 0.0–0.2)

## 2022-02-08 LAB — LACTATE DEHYDROGENASE: LDH: 197 U/L — ABNORMAL HIGH (ref 98–192)

## 2022-02-08 LAB — VITAMIN B12: Vitamin B-12: 267 pg/mL (ref 180–914)

## 2022-02-08 MED ORDER — DEXTROSE 5 % IV SOLN: INTRAVENOUS | Status: DC

## 2022-02-08 MED ORDER — DEXTROSE-NACL 5-0.45 % IV SOLN: INTRAVENOUS | Status: DC

## 2022-02-08 MED ORDER — ACETAMINOPHEN 325 MG PO TABS
650.0000 mg | ORAL_TABLET | Freq: Once | ORAL | Status: AC
  Administered 2022-02-08: 650 mg via ORAL
  Filled 2022-02-08: qty 2

## 2022-02-08 MED ORDER — DIPHENHYDRAMINE HCL 25 MG PO CAPS
25.0000 mg | ORAL_CAPSULE | Freq: Once | ORAL | Status: AC
  Administered 2022-02-08: 25 mg via ORAL
  Filled 2022-02-08: qty 1

## 2022-02-08 MED ORDER — CYANOCOBALAMIN 1000 MCG/ML IJ SOLN
1000.0000 ug | Freq: Once | INTRAMUSCULAR | Status: AC
  Administered 2022-02-08: 1000 ug via INTRAMUSCULAR
  Filled 2022-02-08: qty 1

## 2022-02-08 MED ORDER — IMMUNE GLOBULIN (HUMAN) 20 GM/200ML IV SOLN
40.0000 g | Freq: Once | INTRAVENOUS | Status: AC
  Administered 2022-02-08: 40 g via INTRAVENOUS
  Filled 2022-02-08: qty 400

## 2022-02-08 NOTE — Patient Instructions (Signed)

## 2022-02-08 NOTE — Progress Notes (Signed)
Hematology and Oncology Follow Up Visit  Kristy [name] [id number] [birth year] 66 y.o. 02/08/2022   Principle Diagnosis:  Waldenstrm's macroglobulinemia Acquired hypogammaglobulinemia - recurrent sinusitis Pernicious Anemia  Current Therapy:   Imbruvica 420 mg PO daily IVIG 40 g IV infusion every 6 weeks Vit B12 1000 mcg IM q 6 weeks    Interim History:  [name] is here today for follow-up and treatment.  Unfortunately, she has had a couple problems since we last saw her.  Back in late October, she was hospitalized.  She had apparently cellulitis under her tongue.  She had an infected tooth.  This is a molar in the right mandibular side.  She is going need to have this removed.  She is post to have surgery for more extraction on 02/13/2022.  I told her to stop the [place] today.  She will then restart it 3 days after her extraction.  She was on IV antibiotics while in the hospital.  She was then placed onto Augmentin as an outpatient.  This is nothing to do with her Imbruvica.  Her immune system is fine.  She gets the IVIG.  Her last IgG level back in October was 619 mg/dL.  The Waldenstrom's has been under very good control.  There is no monoclonal spike in her blood.  Her IgM level back in October was only 67 mg/dL.  She has had no problem with the vitamin B12.  Her last vitamin B12 level in October was 360.  She has had no cough or shortness of breath.  There is been no change in bowel or bladder habits.  She has had no rashes.  There is been no bleeding.  Overall, I would say performance status is ECOG 1.  Medications:  Allergies as of 02/08/2022       Reactions   Grapefruit Extract    unknown   Ibuprofen Other (See Comments)   Can not take due to medication        Medication List        Accurate as of February 08, 2022  9:22 AM. If you have any questions, ask your nurse or doctor.          amLODipine 5 MG tablet Commonly known as:  NORVASC Take 5 mg by mouth daily.   Cholecalciferol 25 MCG (1000 UT) tablet Take 1,000 Units by mouth daily.   cyanocobalamin 1000 MCG/ML injection Commonly known as: VITAMIN B12 INJECT 1 ML INTO THE MUSCLE EVERY 2 MONTHS What changed: See the new instructions.   estradiol 0.1 MG/GM vaginal cream Commonly known as: ESTRACE Place 1 Applicatorful vaginally 3 (three) times a week.   Imbruvica 420 MG tablet Generic drug: ibrutinib TAKE 1 TABLET BY MOUTH DAILY AFTER BREAKFAST. What changed:  how much to take how to take this when to take this   Lifitegrast 5 % Soln Place 1 drop into both eyes 2 (two) times daily.   nitrofurantoin (macrocrystal-monohydrate) 100 MG capsule Commonly known as: MACROBID Take 100 mg by mouth daily as needed (For UTI).        Allergies:  Allergies  Allergen Reactions   Grapefruit Extract     unknown   Ibuprofen Other (See Comments)    Can not take due to medication    Past Medical History, Surgical history, Social history, and Family History were reviewed and updated.  Review of Systems: Review of Systems  Constitutional: Negative.   HENT: Negative.    Eyes: Negative.   Respiratory: Negative.  Cardiovascular: Negative.   Gastrointestinal: Negative.   Genitourinary: Negative.   Musculoskeletal: Negative.   Skin: Negative.   Neurological: Negative.   Endo/Heme/Allergies: Negative.   Psychiatric/Behavioral:  The patient is not nervous/anxious.      Physical Exam:  oral temperature is 97.9 F (36.6 C). Her blood pressure is 129/66 and her pulse is 82. Her respiration is 16 and oxygen saturation is 98%.   Wt Readings from Last 3 Encounters:  01/07/22 106 lb 11.2 oz (48.4 kg)  12/29/21 107 lb 12.8 oz (48.9 kg)  11/17/21 109 lb 12.8 oz (49.8 kg)    Physical Exam Vitals reviewed.  HENT:     Head: Normocephalic and atraumatic.  Eyes:     Pupils: Pupils are equal, round, and reactive to light.  Cardiovascular:     Rate and  Rhythm: Normal rate and regular rhythm.     Heart sounds: Normal heart sounds.  Pulmonary:     Effort: Pulmonary effort is normal.     Breath sounds: Normal breath sounds.  Abdominal:     General: Bowel sounds are normal.     Palpations: Abdomen is soft.  Musculoskeletal:        General: No tenderness or deformity. Normal range of motion.     Cervical back: Normal range of motion.  Lymphadenopathy:     Cervical: No cervical adenopathy.  Skin:    General: Skin is warm and dry.     Findings: No erythema or rash.  Neurological:     Mental Status: She is alert and oriented to person, place, and time.  Psychiatric:        Behavior: Behavior normal.        Thought Content: Thought content normal.        Judgment: Judgment normal.     Lab Results  Component Value Date   WBC 6.9 02/08/2022   HGB 14.9 02/08/2022   HCT 44.8 02/08/2022   MCV 103.2 (H) 02/08/2022   PLT 121 (L) 02/08/2022   No results found for: "FERRITIN", "IRON", "TIBC", "UIBC", "IRONPCTSAT" Lab Results  Component Value Date   RETICCTPCT 2.9 (H) 10/30/2014   RBC 4.34 02/08/2022   RETICCTABS 98.3 10/30/2014   Lab Results  Component Value Date   KPAFRELGTCHN 6.7 12/29/2021   LAMBDASER 2.6 (L) 12/29/2021   KAPLAMBRATIO 2.58 (H) 12/29/2021   Lab Results  Component Value Date   IGGSERUM 619 12/29/2021   IGA 7 (L) 12/29/2021   IGMSERUM 67 12/29/2021   Lab Results  Component Value Date   TOTALPROTELP 6.1 12/29/2021   ALBUMINELP 3.6 12/29/2021   A1GS 0.3 12/29/2021   A2GS 0.6 12/29/2021   BETS 0.9 12/29/2021   BETA2SER 0.2 02/15/2015   GAMS 0.7 12/29/2021   MSPIKE Not Observed 12/29/2021   SPEI Comment 09/23/2020     Chemistry      Component Value Date/Time   NA 142 02/08/2022 0823   NA 147 (H) 03/19/2017 0849   NA 139 03/16/2016 1337   K 3.7 02/08/2022 0823   K 4.5 03/19/2017 0849   K 3.8 03/16/2016 1337   CL 103 02/08/2022 0823   CL 108 03/19/2017 0849   CO2 30 02/08/2022 0823   CO2 31  03/19/2017 0849   CO2 27 03/16/2016 1337   BUN 9 02/08/2022 0823   BUN 11 03/19/2017 0849   BUN 5.2 (L) 03/16/2016 1337   CREATININE 0.76 02/08/2022 0823   CREATININE 1.0 03/19/2017 0849   CREATININE 0.7 03/16/2016 1337  Component Value Date/Time   CALCIUM 9.5 02/08/2022 0823   CALCIUM 9.6 03/19/2017 0849   CALCIUM 8.8 03/16/2016 1337   ALKPHOS 64 02/08/2022 0823   ALKPHOS 53 03/19/2017 0849   ALKPHOS 62 03/16/2016 1337   AST 26 02/08/2022 0823   AST 24 03/16/2016 1337   ALT 18 02/08/2022 0823   ALT 23 03/19/2017 0849   ALT 19 03/16/2016 1337   BILITOT 1.1 02/08/2022 0823   BILITOT 0.57 03/16/2016 1337       Impression and Plan: Ms. Ting is a very pleasant 66 yo caucasian female with Waldenstrom's with acquired hypogammaglobulinemia and recurrent sinusitis.  Her Waldenstrom's continues to be under excellent control.   We will see what her Waldenstrom's labs look like.  I would had believe that there can be okay.  She will get her IVIG today.  She will get her vitamin B12 today.  I see no problems at all with her having the tooth extraction on the 27th.  I cannot imagine that there will be any complications from the Imbruvica or from the Waldenstrom's.  She has had no problems with bleeding.  She has had no problems with healing.  We will still plan for follow-up in 6 weeks.     [name], MD 11/22/20239:22 AM

## 2022-02-08 NOTE — Telephone Encounter (Signed)
Faxed patients labs and todays office visit to [place] [place] [facility] [place] per dr Marin Olp request so patient could get tooth extraction.   Fax [phone]

## 2022-02-10 LAB — IGG, IGA, IGM
IgA: 11 mg/dL — ABNORMAL LOW (ref 87–352)
IgG (Immunoglobin G), Serum: 651 mg/dL (ref 586–1602)
IgM (Immunoglobulin M), Srm: 80 mg/dL (ref 26–217)

## 2022-02-10 LAB — KAPPA/LAMBDA LIGHT CHAINS
Kappa free light chain: 7.1 mg/L (ref 3.3–19.4)
Kappa, lambda light chain ratio: 2.54 — ABNORMAL HIGH (ref 0.26–1.65)
Lambda free light chains: 2.8 mg/L — ABNORMAL LOW (ref 5.7–26.3)

## 2022-02-16 ENCOUNTER — Other Ambulatory Visit (HOSPITAL_COMMUNITY): Payer: Self-pay

## 2022-02-17 ENCOUNTER — Other Ambulatory Visit (HOSPITAL_COMMUNITY): Payer: Self-pay

## 2022-02-21 ENCOUNTER — Other Ambulatory Visit (HOSPITAL_COMMUNITY): Payer: Self-pay

## 2022-02-21 ENCOUNTER — Other Ambulatory Visit: Payer: Self-pay

## 2022-02-24 ENCOUNTER — Other Ambulatory Visit: Payer: Self-pay

## 2022-03-16 ENCOUNTER — Other Ambulatory Visit (HOSPITAL_COMMUNITY): Payer: Self-pay

## 2022-03-17 ENCOUNTER — Other Ambulatory Visit (HOSPITAL_COMMUNITY): Payer: Self-pay

## 2022-03-21 ENCOUNTER — Other Ambulatory Visit (HOSPITAL_COMMUNITY): Payer: Self-pay

## 2022-03-21 ENCOUNTER — Other Ambulatory Visit: Payer: Self-pay

## 2022-03-22 ENCOUNTER — Inpatient Hospital Stay: Payer: Federal, State, Local not specified - PPO

## 2022-03-22 ENCOUNTER — Inpatient Hospital Stay: Payer: Federal, State, Local not specified - PPO | Attending: Hematology & Oncology

## 2022-03-22 ENCOUNTER — Inpatient Hospital Stay (HOSPITAL_BASED_OUTPATIENT_CLINIC_OR_DEPARTMENT_OTHER): Payer: Federal, State, Local not specified - PPO | Admitting: Family

## 2022-03-22 ENCOUNTER — Encounter: Payer: Self-pay | Admitting: Family

## 2022-03-22 VITALS — BP 112/58 | HR 78 | Resp 16

## 2022-03-22 VITALS — BP 148/80 | HR 80 | Temp 97.9°F | Resp 17 | Wt 107.4 lb

## 2022-03-22 DIAGNOSIS — Z79899 Other long term (current) drug therapy: Secondary | ICD-10-CM | POA: Insufficient documentation

## 2022-03-22 DIAGNOSIS — D801 Nonfamilial hypogammaglobulinemia: Secondary | ICD-10-CM | POA: Diagnosis not present

## 2022-03-22 DIAGNOSIS — D510 Vitamin B12 deficiency anemia due to intrinsic factor deficiency: Secondary | ICD-10-CM | POA: Diagnosis present

## 2022-03-22 DIAGNOSIS — C880 Waldenstrom macroglobulinemia: Secondary | ICD-10-CM | POA: Diagnosis not present

## 2022-03-22 DIAGNOSIS — J0101 Acute recurrent maxillary sinusitis: Secondary | ICD-10-CM

## 2022-03-22 DIAGNOSIS — J329 Chronic sinusitis, unspecified: Secondary | ICD-10-CM | POA: Diagnosis not present

## 2022-03-22 LAB — CMP (CANCER CENTER ONLY)
ALT: 14 U/L (ref 0–44)
AST: 21 U/L (ref 15–41)
Albumin: 4.3 g/dL (ref 3.5–5.0)
Alkaline Phosphatase: 55 U/L (ref 38–126)
Anion gap: 8 (ref 5–15)
BUN: 11 mg/dL (ref 8–23)
CO2: 29 mmol/L (ref 22–32)
Calcium: 8.6 mg/dL — ABNORMAL LOW (ref 8.9–10.3)
Chloride: 101 mmol/L (ref 98–111)
Creatinine: 0.76 mg/dL (ref 0.44–1.00)
GFR, Estimated: >60 mL/min (ref >60)
Glucose, Bld: 107 mg/dL — ABNORMAL HIGH (ref 70–99)
Potassium: 4.1 mmol/L (ref 3.5–5.1)
Sodium: 138 mmol/L (ref 135–145)
Total Bilirubin: 1.2 mg/dL (ref 0.3–1.2)
Total Protein: 6.4 g/dL — ABNORMAL LOW (ref 6.5–8.1)

## 2022-03-22 LAB — CBC WITH DIFFERENTIAL (CANCER CENTER ONLY)
Abs Immature Granulocytes: 0.1 10*3/uL — ABNORMAL HIGH (ref 0.00–0.07)
Basophils Absolute: 0 10*3/uL (ref 0.0–0.1)
Basophils Relative: 1 %
Eosinophils Absolute: 0.1 10*3/uL (ref 0.0–0.5)
Eosinophils Relative: 1 %
HCT: 39.4 % (ref 36.0–46.0)
Hemoglobin: 13.3 g/dL (ref 12.0–15.0)
Immature Granulocytes: 1 %
Lymphocytes Relative: 31 %
Lymphs Abs: 2.4 10*3/uL (ref 0.7–4.0)
MCH: 34.3 pg — ABNORMAL HIGH (ref 26.0–34.0)
MCHC: 33.8 g/dL (ref 30.0–36.0)
MCV: 101.5 fL — ABNORMAL HIGH (ref 80.0–100.0)
Monocytes Absolute: 0.5 10*3/uL (ref 0.1–1.0)
Monocytes Relative: 7 %
Neutro Abs: 4.6 10*3/uL (ref 1.7–7.7)
Neutrophils Relative %: 59 %
Platelet Count: 111 10*3/uL — ABNORMAL LOW (ref 150–400)
RBC: 3.88 MIL/uL (ref 3.87–5.11)
RDW: 12.6 % (ref 11.5–15.5)
WBC Count: 7.8 10*3/uL (ref 4.0–10.5)
nRBC: 0 % (ref 0.0–0.2)

## 2022-03-22 LAB — VITAMIN B12: Vitamin B-12: 291 pg/mL (ref 180–914)

## 2022-03-22 LAB — LACTATE DEHYDROGENASE: LDH: 192 U/L (ref 98–192)

## 2022-03-22 MED ORDER — DEXTROSE 5 % IV SOLN: INTRAVENOUS | Status: DC

## 2022-03-22 MED ORDER — CYANOCOBALAMIN 1000 MCG/ML IJ SOLN
1000.0000 ug | Freq: Once | INTRAMUSCULAR | Status: AC
  Administered 2022-03-22: 1000 ug via INTRAMUSCULAR
  Filled 2022-03-22: qty 1

## 2022-03-22 MED ORDER — ACETAMINOPHEN 325 MG PO TABS
650.0000 mg | ORAL_TABLET | Freq: Once | ORAL | Status: DC
  Filled 2022-03-22: qty 2

## 2022-03-22 MED ORDER — IMMUNE GLOBULIN (HUMAN) 20 GM/200ML IV SOLN
40.0000 g | Freq: Once | INTRAVENOUS | Status: AC
  Administered 2022-03-22: 40 g via INTRAVENOUS
  Filled 2022-03-22: qty 400

## 2022-03-22 MED ORDER — DIPHENHYDRAMINE HCL 25 MG PO CAPS
25.0000 mg | ORAL_CAPSULE | Freq: Once | ORAL | Status: DC
  Filled 2022-03-22: qty 1

## 2022-03-22 NOTE — Patient Instructions (Signed)

## 2022-03-22 NOTE — Progress Notes (Signed)
Hematology and Oncology Follow Up Visit  Kristy [name] [id number] [birth year] 67 y.o. 03/22/2022   Principle Diagnosis:  Waldenstrm's macroglobulinemia Acquired hypogammaglobulinemia - recurrent sinusitis Pernicious Anemia   Current Therapy:        Imbruvica 420 mg PO daily IVIG 40 g IV infusion every 6 weeks Vit B12 1000 mcg IM q 6 weeks    Interim History:  Kristy [name] is here today for follow-up and treatment. She continues to heal nicely since having her tooth extraction and will be moving forward with an implant in the next couple months.  No new issue with infection. No fever, chills, n/v, cough, rash, dizziness, SOB, chest pain, palpitations, abdominal pain or changes in bowel or bladder habits.  No swelling, numbness or tingling in her extremities. She has intermittent tenderness in her fingers which she feels may be related to arthritis.  No falls or syncope.  Appetite and hydration have been good. Her weight is stable at 107 lbs.  In November her IgM level was 80 mg/dL and IgG level 651 mg/dL.  B12 level at that time was 267.   ECOG Performance Status: 1 - Symptomatic but completely ambulatory  Medications:  Allergies as of 03/22/2022       Reactions   Grapefruit Extract    unknown   Ibuprofen Other (See Comments)   Can not take due to medication        Medication List        Accurate as of March 22, 2022  8:54 AM. If you have any questions, ask your nurse or doctor.          amLODipine 5 MG tablet Commonly known as: NORVASC Take 5 mg by mouth daily.   Cholecalciferol 25 MCG (1000 UT) tablet Take 1,000 Units by mouth daily.   cyanocobalamin 1000 MCG/ML injection Commonly known as: VITAMIN B12 INJECT 1 ML INTO THE MUSCLE EVERY 2 MONTHS What changed: See the new instructions.   estradiol 0.1 MG/GM vaginal cream Commonly known as: ESTRACE Place 1 Applicatorful vaginally 3 (three) times a week.   Imbruvica 420 MG tablet Generic drug:  ibrutinib TAKE 1 TABLET BY MOUTH DAILY AFTER BREAKFAST. What changed:  how much to take how to take this when to take this   Lifitegrast 5 % Soln Place 1 drop into both eyes 2 (two) times daily.   nitrofurantoin (macrocrystal-monohydrate) 100 MG capsule Commonly known as: MACROBID Take 100 mg by mouth daily as needed (For UTI).        Allergies:  Allergies  Allergen Reactions   Grapefruit Extract     unknown   Ibuprofen Other (See Comments)    Can not take due to medication    Past Medical History, Surgical history, Social history, and Family History were reviewed and updated.  Review of Systems: All other 10 point review of systems is negative.   Physical Exam:  weight is 107 lb 6.4 oz (48.7 kg). Her oral temperature is 97.9 F (36.6 C). Her blood pressure is 148/80 (abnormal) and her pulse is 80. Her respiration is 17 and oxygen saturation is 98%.   Wt Readings from Last 3 Encounters:  03/22/22 107 lb 6.4 oz (48.7 kg)  01/07/22 106 lb 11.2 oz (48.4 kg)  12/29/21 107 lb 12.8 oz (48.9 kg)    Ocular: Sclerae unicteric, pupils equal, round and reactive to light Ear-nose-throat: Oropharynx clear, dentition fair Lymphatic: No cervical or supraclavicular adenopathy Lungs no rales or rhonchi, good excursion bilaterally Heart regular rate  and rhythm, no murmur appreciated Abd soft, nontender, positive bowel sounds MSK no focal spinal tenderness, no joint edema Neuro: non-focal, well-oriented, appropriate affect Breasts: Deferred   Lab Results  Component Value Date   WBC 7.8 03/22/2022   HGB 13.3 03/22/2022   HCT 39.4 03/22/2022   MCV 101.5 (H) 03/22/2022   PLT 111 (L) 03/22/2022   No results found for: "FERRITIN", "IRON", "TIBC", "UIBC", "IRONPCTSAT" Lab Results  Component Value Date   RETICCTPCT 2.9 (H) 10/30/2014   RBC 3.88 03/22/2022   RETICCTABS 98.3 10/30/2014   Lab Results  Component Value Date   KPAFRELGTCHN 7.1 02/08/2022   LAMBDASER 2.8 (L)  02/08/2022   KAPLAMBRATIO 2.54 (H) 02/08/2022   Lab Results  Component Value Date   IGGSERUM 651 02/08/2022   IGA 11 (L) 02/08/2022   IGMSERUM 80 02/08/2022   Lab Results  Component Value Date   TOTALPROTELP 6.1 12/29/2021   ALBUMINELP 3.6 12/29/2021   A1GS 0.3 12/29/2021   A2GS 0.6 12/29/2021   BETS 0.9 12/29/2021   BETA2SER 0.2 02/15/2015   GAMS 0.7 12/29/2021   MSPIKE Not Observed 12/29/2021   SPEI Comment 09/23/2020     Chemistry      Component Value Date/Time   NA 142 02/08/2022 0823   NA 147 (H) 03/19/2017 0849   NA 139 03/16/2016 1337   K 3.7 02/08/2022 0823   K 4.5 03/19/2017 0849   K 3.8 03/16/2016 1337   CL 103 02/08/2022 0823   CL 108 03/19/2017 0849   CO2 30 02/08/2022 0823   CO2 31 03/19/2017 0849   CO2 27 03/16/2016 1337   BUN 9 02/08/2022 0823   BUN 11 03/19/2017 0849   BUN 5.2 (L) 03/16/2016 1337   CREATININE 0.76 02/08/2022 0823   CREATININE 1.0 03/19/2017 0849   CREATININE 0.7 03/16/2016 1337      Component Value Date/Time   CALCIUM 9.5 02/08/2022 0823   CALCIUM 9.6 03/19/2017 0849   CALCIUM 8.8 03/16/2016 1337   ALKPHOS 64 02/08/2022 0823   ALKPHOS 53 03/19/2017 0849   ALKPHOS 62 03/16/2016 1337   AST 26 02/08/2022 0823   AST 24 03/16/2016 1337   ALT 18 02/08/2022 0823   ALT 23 03/19/2017 0849   ALT 19 03/16/2016 1337   BILITOT 1.1 02/08/2022 0823   BILITOT 0.57 03/16/2016 1337       Impression and Plan: Kristy [name] is a very pleasant 67 yo caucasian female with Waldenstrom's with acquired hypogammaglobulinemia and recurrent sinusitis.  Her counts have remained stable.  We will proceed with IVIG treatment and B12 injection as planned.  She is back on her Imbruvica and tolerating nicely.  Follow-up in 6 weeks.   Lottie Dawson, NP 1/3/20248:54 AM

## 2022-03-23 LAB — KAPPA/LAMBDA LIGHT CHAINS
Kappa free light chain: 6.5 mg/L (ref 3.3–19.4)
Kappa, lambda light chain ratio: 1.97 — ABNORMAL HIGH (ref 0.26–1.65)
Lambda free light chains: 3.3 mg/L — ABNORMAL LOW (ref 5.7–26.3)

## 2022-03-23 LAB — IGG, IGA, IGM
IgA: 10 mg/dL — ABNORMAL LOW (ref 87–352)
IgG (Immunoglobin G), Serum: 577 mg/dL — ABNORMAL LOW (ref 586–1602)
IgM (Immunoglobulin M), Srm: 111 mg/dL (ref 26–217)

## 2022-03-24 LAB — PROTEIN ELECTROPHORESIS, SERUM, WITH REFLEX
A/G Ratio: 1.6 (ref 0.7–1.7)
Albumin ELP: 3.5 g/dL (ref 2.9–4.4)
Alpha-1-Globulin: 0.2 g/dL (ref 0.0–0.4)
Alpha-2-Globulin: 0.5 g/dL (ref 0.4–1.0)
Beta Globulin: 0.8 g/dL (ref 0.7–1.3)
Gamma Globulin: 0.6 g/dL (ref 0.4–1.8)
Globulin, Total: 2.2 g/dL (ref 2.2–3.9)
Total Protein ELP: 5.7 g/dL — ABNORMAL LOW (ref 6.0–8.5)

## 2022-04-11 ENCOUNTER — Other Ambulatory Visit (HOSPITAL_COMMUNITY): Payer: Self-pay

## 2022-04-18 ENCOUNTER — Other Ambulatory Visit: Payer: Self-pay

## 2022-05-04 ENCOUNTER — Encounter: Payer: Self-pay | Admitting: Hematology & Oncology

## 2022-05-04 ENCOUNTER — Inpatient Hospital Stay: Payer: Federal, State, Local not specified - PPO

## 2022-05-04 ENCOUNTER — Inpatient Hospital Stay: Payer: Federal, State, Local not specified - PPO | Attending: Hematology & Oncology

## 2022-05-04 ENCOUNTER — Inpatient Hospital Stay (HOSPITAL_BASED_OUTPATIENT_CLINIC_OR_DEPARTMENT_OTHER): Payer: Federal, State, Local not specified - PPO | Admitting: Hematology & Oncology

## 2022-05-04 VITALS — BP 124/63 | HR 78 | Temp 97.6°F | Resp 20 | Ht 60.0 in | Wt 104.0 lb

## 2022-05-04 VITALS — BP 120/65 | HR 76 | Resp 18

## 2022-05-04 DIAGNOSIS — J329 Chronic sinusitis, unspecified: Secondary | ICD-10-CM | POA: Diagnosis not present

## 2022-05-04 DIAGNOSIS — C880 Waldenstrom macroglobulinemia: Secondary | ICD-10-CM

## 2022-05-04 DIAGNOSIS — Z79899 Other long term (current) drug therapy: Secondary | ICD-10-CM | POA: Insufficient documentation

## 2022-05-04 DIAGNOSIS — D510 Vitamin B12 deficiency anemia due to intrinsic factor deficiency: Secondary | ICD-10-CM

## 2022-05-04 DIAGNOSIS — D801 Nonfamilial hypogammaglobulinemia: Secondary | ICD-10-CM | POA: Diagnosis present

## 2022-05-04 DIAGNOSIS — J0101 Acute recurrent maxillary sinusitis: Secondary | ICD-10-CM

## 2022-05-04 LAB — CBC WITH DIFFERENTIAL (CANCER CENTER ONLY)
Abs Immature Granulocytes: 0.05 10*3/uL (ref 0.00–0.07)
Basophils Absolute: 0 10*3/uL (ref 0.0–0.1)
Basophils Relative: 0 %
Eosinophils Absolute: 0.1 10*3/uL (ref 0.0–0.5)
Eosinophils Relative: 1 %
HCT: 41.1 % (ref 36.0–46.0)
Hemoglobin: 13.7 g/dL (ref 12.0–15.0)
Immature Granulocytes: 1 %
Lymphocytes Relative: 25 %
Lymphs Abs: 1.7 10*3/uL (ref 0.7–4.0)
MCH: 34.3 pg — ABNORMAL HIGH (ref 26.0–34.0)
MCHC: 33.3 g/dL (ref 30.0–36.0)
MCV: 103 fL — ABNORMAL HIGH (ref 80.0–100.0)
Monocytes Absolute: 0.4 10*3/uL (ref 0.1–1.0)
Monocytes Relative: 6 %
Neutro Abs: 4.5 10*3/uL (ref 1.7–7.7)
Neutrophils Relative %: 67 %
Platelet Count: 109 10*3/uL — ABNORMAL LOW (ref 150–400)
RBC: 3.99 MIL/uL (ref 3.87–5.11)
RDW: 12.2 % (ref 11.5–15.5)
WBC Count: 6.7 10*3/uL (ref 4.0–10.5)
nRBC: 0 % (ref 0.0–0.2)

## 2022-05-04 LAB — CMP (CANCER CENTER ONLY)
ALT: 13 U/L (ref 0–44)
AST: 20 U/L (ref 15–41)
Albumin: 4.4 g/dL (ref 3.5–5.0)
Alkaline Phosphatase: 62 U/L (ref 38–126)
Anion gap: 8 (ref 5–15)
BUN: 11 mg/dL (ref 8–23)
CO2: 30 mmol/L (ref 22–32)
Calcium: 8.9 mg/dL (ref 8.9–10.3)
Chloride: 103 mmol/L (ref 98–111)
Creatinine: 0.78 mg/dL (ref 0.44–1.00)
GFR, Estimated: >60 mL/min (ref >60)
Glucose, Bld: 101 mg/dL — ABNORMAL HIGH (ref 70–99)
Potassium: 4.3 mmol/L (ref 3.5–5.1)
Sodium: 141 mmol/L (ref 135–145)
Total Bilirubin: 1 mg/dL (ref 0.3–1.2)
Total Protein: 6.6 g/dL (ref 6.5–8.1)

## 2022-05-04 LAB — VITAMIN B12: Vitamin B-12: 292 pg/mL (ref 180–914)

## 2022-05-04 LAB — LACTATE DEHYDROGENASE: LDH: 208 U/L — ABNORMAL HIGH (ref 98–192)

## 2022-05-04 MED ORDER — CYANOCOBALAMIN 1000 MCG/ML IJ SOLN
1000.0000 ug | Freq: Once | INTRAMUSCULAR | Status: AC
  Administered 2022-05-04: 1000 ug via INTRAMUSCULAR
  Filled 2022-05-04: qty 1

## 2022-05-04 MED ORDER — DIPHENHYDRAMINE HCL 25 MG PO CAPS: 25.0000 mg | ORAL_CAPSULE | Freq: Once | ORAL | Status: DC

## 2022-05-04 MED ORDER — ACETAMINOPHEN 325 MG PO TABS: 650.0000 mg | ORAL_TABLET | Freq: Once | ORAL | Status: DC

## 2022-05-04 MED ORDER — IMMUNE GLOBULIN (HUMAN) 20 GM/200ML IV SOLN
40.0000 g | Freq: Once | INTRAVENOUS | Status: AC
  Administered 2022-05-04: 40 g via INTRAVENOUS
  Filled 2022-05-04: qty 400

## 2022-05-04 MED ORDER — DEXTROSE 5 % IV SOLN: INTRAVENOUS | Status: DC

## 2022-05-04 NOTE — Progress Notes (Signed)
Hematology and Oncology Follow Up Visit  YASEMIN GARR [id number] Aug 10, 1955 67 y.o. 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 Perini is here today for follow-up and treatment.  She has some abdominal pain about a week ago.  This lasted for about 3 to 4 days.  Is in the lower abdomen.  She has not had a colonoscopy.  She does do the Cologuard.  Last Cologuard was back in October and was negative.  She said this just went away on its own.  There is no nausea or vomiting associated with it.  She had no bleeding.  She had no diarrhea.  She has done very well with the Imbruvica.  Her last monoclonal studies did not show any monoclonal spike in the blood.  Her IgM level was 111 mg/dL.  Her last vitamin B12 level back in early January was 291.  She has had no problems with rashes.  There is been no leg swelling.  She has had no cough or shortness of breath.  Overall, I would say performance status is probably ECOG 1.    Medications:  Allergies as of 05/04/2022       Reactions   Grapefruit Extract    unknown   Ibuprofen Other (See Comments)   Can not take due to medication        Medication List        Accurate as of May 04, 2022  8:32 AM. If you have any questions, ask your nurse or doctor.          amLODipine 5 MG tablet Commonly known as: NORVASC Take 5 mg by mouth daily.   Cholecalciferol 25 MCG (1000 UT) tablet Take 1,000 Units by mouth daily.   cyanocobalamin 1000 MCG/ML injection Commonly known as: VITAMIN B12 INJECT 1 ML INTO THE MUSCLE EVERY 2 MONTHS What changed: See the new instructions.   estradiol 0.1 MG/GM vaginal cream Commonly known as: ESTRACE Place 1 Applicatorful vaginally 3 (three) times a week.    Imbruvica 420 MG tablet Generic drug: ibrutinib TAKE 1 TABLET BY MOUTH DAILY AFTER BREAKFAST. What changed:  how much to take how to take this when to take this   Lifitegrast 5 % Soln Place 1 drop into both eyes 2 (two) times daily.   nitrofurantoin (macrocrystal-monohydrate) 100 MG capsule Commonly known as: MACROBID Take 100 mg by mouth daily as needed (For UTI).        Allergies:  Allergies  Allergen Reactions   Grapefruit Extract     unknown   Ibuprofen Other (See Comments)    Can not take due to medication    Past Medical History, Surgical history, Social history, and Family History were reviewed and updated.  Review of Systems: Review of Systems  Constitutional: Negative.   HENT: Negative.    Eyes: Negative.   Respiratory: Negative.    Cardiovascular: Negative.   Gastrointestinal: Negative.   Genitourinary: Negative.   Musculoskeletal: Negative.   Skin: Negative.   Neurological: Negative.   Endo/Heme/Allergies: Negative.   Psychiatric/Behavioral:  The patient is not nervous/anxious.      Physical Exam:  height is 5' (1.524 m) and weight is 104 lb (47.2 kg). Her oral temperature is 97.6 F (36.4 C). Her blood pressure is 124/63  and her pulse is 78. Her respiration is 20 and oxygen saturation is 99%.   Wt Readings from Last 3 Encounters:  05/04/22 104 lb (47.2 kg)  03/22/22 107 lb 6.4 oz (48.7 kg)  01/07/22 106 lb 11.2 oz (48.4 kg)    Physical Exam Vitals reviewed.  HENT:     Head: Normocephalic and atraumatic.  Eyes:     Pupils: Pupils are equal, round, and reactive to light.  Cardiovascular:     Rate and Rhythm: Normal rate and regular rhythm.     Heart sounds: Normal heart sounds.  Pulmonary:     Effort: Pulmonary effort is normal.     Breath sounds: Normal breath sounds.  Abdominal:     General: Bowel sounds are normal.     Palpations: Abdomen is soft.  Musculoskeletal:        General: No tenderness or deformity. Normal range of  motion.     Cervical back: Normal range of motion.  Lymphadenopathy:     Cervical: No cervical adenopathy.  Skin:    General: Skin is warm and dry.     Findings: No erythema or rash.  Neurological:     Mental Status: She is alert and oriented to person, place, and time.  Psychiatric:        Behavior: Behavior normal.        Thought Content: Thought content normal.        Judgment: Judgment normal.      Lab Results  Component Value Date   WBC 6.7 05/04/2022   HGB 13.7 05/04/2022   HCT 41.1 05/04/2022   MCV 103.0 (H) 05/04/2022   PLT 109 (L) 05/04/2022   No results found for: "FERRITIN", "IRON", "TIBC", "UIBC", "IRONPCTSAT" Lab Results  Component Value Date   RETICCTPCT 2.9 (H) 10/30/2014   RBC 3.99 05/04/2022   RETICCTABS 98.3 10/30/2014   Lab Results  Component Value Date   KPAFRELGTCHN 6.5 03/22/2022   LAMBDASER 3.3 (L) 03/22/2022   KAPLAMBRATIO 1.97 (H) 03/22/2022   Lab Results  Component Value Date   IGGSERUM 577 (L) 03/22/2022   IGA 10 (L) 03/22/2022   IGMSERUM 111 03/22/2022   Lab Results  Component Value Date   TOTALPROTELP 5.7 (L) 03/22/2022   ALBUMINELP 3.5 03/22/2022   A1GS 0.2 03/22/2022   A2GS 0.5 03/22/2022   BETS 0.8 03/22/2022   BETA2SER 0.2 02/15/2015   GAMS 0.6 03/22/2022   MSPIKE Not Observed 03/22/2022   SPEI Comment 09/23/2020     Chemistry      Component Value Date/Time   NA 138 03/22/2022 0817   NA 147 (H) 03/19/2017 0849   NA 139 03/16/2016 1337   K 4.1 03/22/2022 0817   K 4.5 03/19/2017 0849   K 3.8 03/16/2016 1337   CL 101 03/22/2022 0817   CL 108 03/19/2017 0849   CO2 29 03/22/2022 0817   CO2 31 03/19/2017 0849   CO2 27 03/16/2016 1337   BUN 11 03/22/2022 0817   BUN 11 03/19/2017 0849   BUN 5.2 (L) 03/16/2016 1337   CREATININE 0.76 03/22/2022 0817   CREATININE 1.0 03/19/2017 0849   CREATININE 0.7 03/16/2016 1337      Component Value Date/Time   CALCIUM 8.6 (L) 03/22/2022 0817   CALCIUM 9.6 03/19/2017 0849    CALCIUM 8.8 03/16/2016 1337   ALKPHOS 55 03/22/2022 0817   ALKPHOS 53 03/19/2017 0849   ALKPHOS 62 03/16/2016 1337   AST 21 03/22/2022 0817   AST 24 03/16/2016 1337  ALT 14 03/22/2022 0817   ALT 23 03/19/2017 0849   ALT 19 03/16/2016 1337   BILITOT 1.2 03/22/2022 0817   BILITOT 0.57 03/16/2016 1337       Impression and Plan: Ms. Wehrheim is a very pleasant 67 yo caucasian female with Waldenstrom's with acquired hypogammaglobulinemia and recurrent sinusitis.  Her Waldenstrom's continues to be under excellent control.   We will see what her Waldenstrom's labs look like.  I would have believe that there can be okay.  She will get her IVIG today.  She will get her vitamin B12 today.  I think that she has this abdominal pain again, she is back and need to have a colonoscopy.  She may have diverticulosis.  We will still plan for follow-up in 6 weeks.  [name], MD 2/15/20248:32 AM

## 2022-05-04 NOTE — Patient Instructions (Signed)

## 2022-05-05 LAB — KAPPA/LAMBDA LIGHT CHAINS
Kappa free light chain: 6.3 mg/L (ref 3.3–19.4)
Kappa, lambda light chain ratio: 1.97 — ABNORMAL HIGH (ref 0.26–1.65)
Lambda free light chains: 3.2 mg/L — ABNORMAL LOW (ref 5.7–26.3)

## 2022-05-07 LAB — IGG, IGA, IGM
IgA: 11 mg/dL — ABNORMAL LOW (ref 87–352)
IgG (Immunoglobin G), Serum: 631 mg/dL (ref 586–1602)
IgM (Immunoglobulin M), Srm: 83 mg/dL (ref 26–217)

## 2022-05-08 LAB — PROTEIN ELECTROPHORESIS, SERUM
A/G Ratio: 1.6 (ref 0.7–1.7)
Albumin ELP: 3.8 g/dL (ref 2.9–4.4)
Alpha-1-Globulin: 0.3 g/dL (ref 0.0–0.4)
Alpha-2-Globulin: 0.6 g/dL (ref 0.4–1.0)
Beta Globulin: 0.9 g/dL (ref 0.7–1.3)
Gamma Globulin: 0.6 g/dL (ref 0.4–1.8)
Globulin, Total: 2.4 g/dL (ref 2.2–3.9)
Total Protein ELP: 6.2 g/dL (ref 6.0–8.5)

## 2022-05-11 ENCOUNTER — Other Ambulatory Visit (HOSPITAL_COMMUNITY): Payer: Self-pay

## 2022-05-16 ENCOUNTER — Other Ambulatory Visit (HOSPITAL_COMMUNITY): Payer: Self-pay

## 2022-05-17 ENCOUNTER — Other Ambulatory Visit (HOSPITAL_COMMUNITY): Payer: Self-pay

## 2022-06-11 ENCOUNTER — Other Ambulatory Visit: Payer: Self-pay | Admitting: Hematology & Oncology

## 2022-06-11 MED ORDER — IBRUTINIB 420 MG PO TABS
ORAL_TABLET | ORAL | 12 refills | Status: DC
  Filled 2022-06-11: qty 28, fill #0
  Filled 2022-06-12: qty 28, 28d supply, fill #0
  Filled 2022-07-07: qty 28, 28d supply, fill #1
  Filled 2022-08-01: qty 28, 28d supply, fill #2
  Filled 2022-08-30: qty 28, 28d supply, fill #3
  Filled 2022-10-03: qty 28, 28d supply, fill #4
  Filled 2022-11-01: qty 28, 28d supply, fill #5
  Filled 2022-11-27: qty 28, 28d supply, fill #6
  Filled 2022-12-27: qty 28, 28d supply, fill #7
  Filled 2023-01-24: qty 28, 28d supply, fill #8
  Filled 2023-02-26: qty 28, 28d supply, fill #9
  Filled 2023-03-19: qty 28, 28d supply, fill #10
  Filled 2023-04-10 – 2023-04-11 (×2): qty 28, 28d supply, fill #11
  Filled 2023-05-10: qty 28, 28d supply, fill #12

## 2022-06-12 ENCOUNTER — Other Ambulatory Visit: Payer: Self-pay

## 2022-06-12 ENCOUNTER — Other Ambulatory Visit (HOSPITAL_COMMUNITY): Payer: Self-pay

## 2022-06-15 ENCOUNTER — Encounter: Payer: Self-pay | Admitting: Hematology & Oncology

## 2022-06-15 ENCOUNTER — Inpatient Hospital Stay: Payer: Federal, State, Local not specified - PPO

## 2022-06-15 ENCOUNTER — Other Ambulatory Visit: Payer: Self-pay

## 2022-06-15 ENCOUNTER — Inpatient Hospital Stay: Payer: Federal, State, Local not specified - PPO | Attending: Hematology & Oncology

## 2022-06-15 ENCOUNTER — Inpatient Hospital Stay: Payer: Federal, State, Local not specified - PPO | Admitting: Hematology & Oncology

## 2022-06-15 VITALS — BP 118/54 | HR 74 | Temp 98.2°F | Resp 17

## 2022-06-15 VITALS — BP 141/66 | HR 71 | Temp 97.7°F | Resp 16 | Ht 60.0 in | Wt 105.0 lb

## 2022-06-15 DIAGNOSIS — D801 Nonfamilial hypogammaglobulinemia: Secondary | ICD-10-CM | POA: Diagnosis present

## 2022-06-15 DIAGNOSIS — D510 Vitamin B12 deficiency anemia due to intrinsic factor deficiency: Secondary | ICD-10-CM | POA: Insufficient documentation

## 2022-06-15 DIAGNOSIS — Z79899 Other long term (current) drug therapy: Secondary | ICD-10-CM | POA: Diagnosis not present

## 2022-06-15 DIAGNOSIS — C880 Waldenstrom macroglobulinemia: Secondary | ICD-10-CM

## 2022-06-15 DIAGNOSIS — J0101 Acute recurrent maxillary sinusitis: Secondary | ICD-10-CM

## 2022-06-15 DIAGNOSIS — J329 Chronic sinusitis, unspecified: Secondary | ICD-10-CM | POA: Insufficient documentation

## 2022-06-15 LAB — CBC WITH DIFFERENTIAL (CANCER CENTER ONLY)
Abs Immature Granulocytes: 0.05 10*3/uL (ref 0.00–0.07)
Basophils Absolute: 0 10*3/uL (ref 0.0–0.1)
Basophils Relative: 0 %
Eosinophils Absolute: 0.1 10*3/uL (ref 0.0–0.5)
Eosinophils Relative: 1 %
HCT: 41.5 % (ref 36.0–46.0)
Hemoglobin: 14.1 g/dL (ref 12.0–15.0)
Immature Granulocytes: 1 %
Lymphocytes Relative: 26 %
Lymphs Abs: 2.3 10*3/uL (ref 0.7–4.0)
MCH: 35 pg — ABNORMAL HIGH (ref 26.0–34.0)
MCHC: 34 g/dL (ref 30.0–36.0)
MCV: 103 fL — ABNORMAL HIGH (ref 80.0–100.0)
Monocytes Absolute: 0.6 10*3/uL (ref 0.1–1.0)
Monocytes Relative: 7 %
Neutro Abs: 5.5 10*3/uL (ref 1.7–7.7)
Neutrophils Relative %: 65 %
Platelet Count: 110 10*3/uL — ABNORMAL LOW (ref 150–400)
RBC: 4.03 MIL/uL (ref 3.87–5.11)
RDW: 12.4 % (ref 11.5–15.5)
WBC Count: 8.5 10*3/uL (ref 4.0–10.5)
nRBC: 0 % (ref 0.0–0.2)

## 2022-06-15 LAB — CMP (CANCER CENTER ONLY)
ALT: 14 U/L (ref 0–44)
AST: 22 U/L (ref 15–41)
Albumin: 4.6 g/dL (ref 3.5–5.0)
Alkaline Phosphatase: 64 U/L (ref 38–126)
Anion gap: 8 (ref 5–15)
BUN: 13 mg/dL (ref 8–23)
CO2: 30 mmol/L (ref 22–32)
Calcium: 9.3 mg/dL (ref 8.9–10.3)
Chloride: 102 mmol/L (ref 98–111)
Creatinine: 0.8 mg/dL (ref 0.44–1.00)
GFR, Estimated: >60 mL/min (ref >60)
Glucose, Bld: 90 mg/dL (ref 70–99)
Potassium: 3.9 mmol/L (ref 3.5–5.1)
Sodium: 140 mmol/L (ref 135–145)
Total Bilirubin: 1.3 mg/dL — ABNORMAL HIGH (ref 0.3–1.2)
Total Protein: 6.4 g/dL — ABNORMAL LOW (ref 6.5–8.1)

## 2022-06-15 LAB — LACTATE DEHYDROGENASE: LDH: 205 U/L — ABNORMAL HIGH (ref 98–192)

## 2022-06-15 LAB — VITAMIN B12: Vitamin B-12: 264 pg/mL (ref 180–914)

## 2022-06-15 MED ORDER — ACETAMINOPHEN 325 MG PO TABS: 650.0000 mg | ORAL_TABLET | Freq: Once | ORAL | Status: DC

## 2022-06-15 MED ORDER — DEXTROSE 5 % IV SOLN: INTRAVENOUS | Status: DC

## 2022-06-15 MED ORDER — IMMUNE GLOBULIN (HUMAN) 20 GM/200ML IV SOLN
40.0000 g | Freq: Once | INTRAVENOUS | Status: AC
  Administered 2022-06-15: 40 g via INTRAVENOUS
  Filled 2022-06-15: qty 400

## 2022-06-15 MED ORDER — DIPHENHYDRAMINE HCL 25 MG PO CAPS: 25.0000 mg | ORAL_CAPSULE | Freq: Once | ORAL | Status: DC

## 2022-06-15 MED ORDER — CYANOCOBALAMIN 1000 MCG/ML IJ SOLN
1000.0000 ug | Freq: Once | INTRAMUSCULAR | Status: AC
  Administered 2022-06-15: 1000 ug via INTRAMUSCULAR
  Filled 2022-06-15: qty 1

## 2022-06-15 NOTE — Patient Instructions (Signed)

## 2022-06-15 NOTE — Progress Notes (Signed)
Hematology and Oncology Follow Up Visit  TIOSHA HUSS [id number] 01-Sep-1955 67 y.o. 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 Icard is here today for follow-up and treatment.  She is doing okay.  She is quite worried over issues with the spectrum of her mouth.  She had an infected tooth removed I think back in November.  She is awaiting to have a implant placed.  I told her that if she cannot have an implant, we need to make sure she is on antibiotics right before the procedure.  She is also worried about her blood pressure.  I am sure that the [facility] probably is contributing to this.  She is on Norvasc.  She is losing her family doctor.  I told her that we can adjust her Norvasc if necessary.  I will be more than happy to help out with that if necessary.  As far as her Waldenstrom's is concerned, this is not a problem.  There is no monoclonal spike in her blood.  Her last IgM level was 83 mg/dL.  Her last IgG level was 631 mg/dL.  She has had no fever.  She has had no bleeding.  There has been no change in bowel or bladder habits.   Her last vitamin B 12 level back in February was 292.  Currently, I would say that her performance status is probably ECOG 1.   Medications:  Allergies as of 06/15/2022       Reactions   Grapefruit Extract    unknown   Nitrofurantoin Other (See Comments)   Ibuprofen Other (See Comments)   Can not take due to medication        Medication List        Accurate as of June 15, 2022  8:59 AM. If you have any questions, ask your nurse or doctor.          amLODipine 5 MG tablet Commonly known as: NORVASC Take 5 mg by mouth daily.   Cholecalciferol 25 MCG (1000 UT) tablet Take 1,000 Units by mouth  daily.   cyanocobalamin 1000 MCG/ML injection Commonly known as: VITAMIN B12 INJECT 1 ML INTO THE MUSCLE EVERY 2 MONTHS What changed: See the new instructions.   estradiol 0.1 MG/GM vaginal cream Commonly known as: ESTRACE Place 1 Applicatorful vaginally 3 (three) times a week.   Imbruvica 420 MG tablet Generic drug: ibrutinib TAKE 1 TABLET BY MOUTH DAILY AFTER BREAKFAST.   Lifitegrast 5 % Soln Place 1 drop into both eyes 2 (two) times daily.   mupirocin ointment 2 % Commonly known as: BACTROBAN Apply 1 Application topically 3 (three) times daily.   nitrofurantoin (macrocrystal-monohydrate) 100 MG capsule Commonly known as: MACROBID Take 100 mg by mouth daily as needed (For UTI).   triamcinolone cream 0.5 % Commonly known as: KENALOG Apply 1 Application topically 2 (two) times daily.        Allergies:  Allergies  Allergen Reactions   Grapefruit Extract     unknown   Nitrofurantoin Other (See Comments)   Ibuprofen Other (See Comments)    Can not take due to medication    Past Medical History, Surgical history, Social history, and Family History were reviewed and updated.  Review of Systems: Review of Systems  Constitutional:  Negative.   HENT: Negative.    Eyes: Negative.   Respiratory: Negative.    Cardiovascular: Negative.   Gastrointestinal: Negative.   Genitourinary: Negative.   Musculoskeletal: Negative.   Skin: Negative.   Neurological: Negative.   Endo/Heme/Allergies: Negative.   Psychiatric/Behavioral:  The patient is not nervous/anxious.      Physical Exam:  height is 5' (1.524 m) and weight is 105 lb (47.6 kg). Her oral temperature is 97.7 F (36.5 C). Her blood pressure is 141/66 (abnormal) and her pulse is 71. Her respiration is 16 and oxygen saturation is 98%.   Wt Readings from Last 3 Encounters:  06/15/22 105 lb (47.6 kg)  05/04/22 104 lb (47.2 kg)  03/22/22 107 lb 6.4 oz (48.7 kg)    Physical Exam Vitals reviewed.  HENT:      Head: Normocephalic and atraumatic.  Eyes:     Pupils: Pupils are equal, round, and reactive to light.  Cardiovascular:     Rate and Rhythm: Normal rate and regular rhythm.     Heart sounds: Normal heart sounds.  Pulmonary:     Effort: Pulmonary effort is normal.     Breath sounds: Normal breath sounds.  Abdominal:     General: Bowel sounds are normal.     Palpations: Abdomen is soft.  Musculoskeletal:        General: No tenderness or deformity. Normal range of motion.     Cervical back: Normal range of motion.  Lymphadenopathy:     Cervical: No cervical adenopathy.  Skin:    General: Skin is warm and dry.     Findings: No erythema or rash.  Neurological:     Mental Status: She is alert and oriented to person, place, and time.  Psychiatric:        Behavior: Behavior normal.        Thought Content: Thought content normal.        Judgment: Judgment normal.     Lab Results  Component Value Date   WBC 8.5 06/15/2022   HGB 14.1 06/15/2022   HCT 41.5 06/15/2022   MCV 103.0 (H) 06/15/2022   PLT 110 (L) 06/15/2022   No results found for: "FERRITIN", "IRON", "TIBC", "UIBC", "IRONPCTSAT" Lab Results  Component Value Date   RETICCTPCT 2.9 (H) 10/30/2014   RBC 4.03 06/15/2022   RETICCTABS 98.3 10/30/2014   Lab Results  Component Value Date   KPAFRELGTCHN 6.3 05/04/2022   LAMBDASER 3.2 (L) 05/04/2022   KAPLAMBRATIO 1.97 (H) 05/04/2022   Lab Results  Component Value Date   IGGSERUM 631 05/04/2022   IGA 11 (L) 05/04/2022   IGMSERUM 83 05/04/2022   Lab Results  Component Value Date   TOTALPROTELP 6.2 05/04/2022   ALBUMINELP 3.8 05/04/2022   A1GS 0.3 05/04/2022   A2GS 0.6 05/04/2022   BETS 0.9 05/04/2022   BETA2SER 0.2 02/15/2015   GAMS 0.6 05/04/2022   MSPIKE Not Observed 05/04/2022   SPEI Comment 05/04/2022     Chemistry      Component Value Date/Time   NA 140 06/15/2022 0813   NA 147 (H) 03/19/2017 0849   NA 139 03/16/2016 1337   K 3.9 06/15/2022 0813   K  4.5 03/19/2017 0849   K 3.8 03/16/2016 1337   CL 102 06/15/2022 0813   CL 108 03/19/2017 0849   CO2 30 06/15/2022 0813   CO2 31 03/19/2017 0849   CO2 27 03/16/2016 1337   BUN 13 06/15/2022 0813   BUN 11 03/19/2017 0849  BUN 5.2 (L) 03/16/2016 1337   CREATININE 0.80 06/15/2022 0813   CREATININE 1.0 03/19/2017 0849   CREATININE 0.7 03/16/2016 1337      Component Value Date/Time   CALCIUM 9.3 06/15/2022 0813   CALCIUM 9.6 03/19/2017 0849   CALCIUM 8.8 03/16/2016 1337   ALKPHOS 64 06/15/2022 0813   ALKPHOS 53 03/19/2017 0849   ALKPHOS 62 03/16/2016 1337   AST 22 06/15/2022 0813   AST 24 03/16/2016 1337   ALT 14 06/15/2022 0813   ALT 23 03/19/2017 0849   ALT 19 03/16/2016 1337   BILITOT 1.3 (H) 06/15/2022 0813   BILITOT 0.57 03/16/2016 1337       Impression and Plan: Ms. Curti is a very pleasant 67 yo caucasian female with Waldenstrom's with acquired hypogammaglobulinemia and recurrent sinusitis.  Her Waldenstrom's continues to be under excellent control.   We will see what her Waldenstrom's labs look like.  I would have believe that there can be okay.  She will get her IVIG today.  She will get her vitamin B12 today.  I know that she will be okay with this prior to her oral issues.  Again, she will let us know when the implant will take place.  Will then put her on amoxicillin.  We will subsequently plan to get her back in 6 weeks.   [name], MD 3/28/20248:59 AM

## 2022-06-15 NOTE — Progress Notes (Signed)
Pt declined to stay for post infusion observation period. Pt stated she has tolerated medication multiple times prior without difficulty. Pt aware to call clinic with any questions or concerns. Pt verbalized understanding and had no further questions.  ? ?

## 2022-06-16 LAB — KAPPA/LAMBDA LIGHT CHAINS
Kappa free light chain: 5.7 mg/L (ref 3.3–19.4)
Kappa, lambda light chain ratio: 1.84 — ABNORMAL HIGH (ref 0.26–1.65)
Lambda free light chains: 3.1 mg/L — ABNORMAL LOW (ref 5.7–26.3)

## 2022-06-17 LAB — IGG, IGA, IGM
IgA: 10 mg/dL — ABNORMAL LOW (ref 87–352)
IgG (Immunoglobin G), Serum: 612 mg/dL (ref 586–1602)
IgM (Immunoglobulin M), Srm: 75 mg/dL (ref 26–217)

## 2022-06-19 LAB — PROTEIN ELECTROPHORESIS, SERUM, WITH REFLEX
A/G Ratio: 2 — ABNORMAL HIGH (ref 0.7–1.7)
Albumin ELP: 4.1 g/dL (ref 2.9–4.4)
Alpha-1-Globulin: 0.3 g/dL (ref 0.0–0.4)
Alpha-2-Globulin: 0.4 g/dL (ref 0.4–1.0)
Beta Globulin: 0.8 g/dL (ref 0.7–1.3)
Gamma Globulin: 0.6 g/dL (ref 0.4–1.8)
Globulin, Total: 2.1 g/dL — ABNORMAL LOW (ref 2.2–3.9)
Total Protein ELP: 6.2 g/dL (ref 6.0–8.5)

## 2022-07-06 ENCOUNTER — Other Ambulatory Visit (HOSPITAL_COMMUNITY): Payer: Self-pay

## 2022-07-07 ENCOUNTER — Other Ambulatory Visit: Payer: Self-pay

## 2022-07-12 ENCOUNTER — Other Ambulatory Visit (HOSPITAL_COMMUNITY): Payer: Self-pay

## 2022-07-27 ENCOUNTER — Inpatient Hospital Stay: Payer: Federal, State, Local not specified - PPO

## 2022-07-27 ENCOUNTER — Inpatient Hospital Stay (HOSPITAL_BASED_OUTPATIENT_CLINIC_OR_DEPARTMENT_OTHER): Payer: Federal, State, Local not specified - PPO | Admitting: Hematology & Oncology

## 2022-07-27 ENCOUNTER — Inpatient Hospital Stay: Payer: Federal, State, Local not specified - PPO | Attending: Hematology & Oncology

## 2022-07-27 ENCOUNTER — Encounter: Payer: Self-pay | Admitting: Hematology & Oncology

## 2022-07-27 VITALS — BP 139/69 | HR 84 | Temp 97.5°F | Resp 20 | Ht 60.0 in | Wt 103.0 lb

## 2022-07-27 VITALS — BP 106/73 | HR 78

## 2022-07-27 DIAGNOSIS — D510 Vitamin B12 deficiency anemia due to intrinsic factor deficiency: Secondary | ICD-10-CM | POA: Insufficient documentation

## 2022-07-27 DIAGNOSIS — C880 Waldenstrom macroglobulinemia: Secondary | ICD-10-CM

## 2022-07-27 DIAGNOSIS — D801 Nonfamilial hypogammaglobulinemia: Secondary | ICD-10-CM | POA: Insufficient documentation

## 2022-07-27 DIAGNOSIS — J0101 Acute recurrent maxillary sinusitis: Secondary | ICD-10-CM

## 2022-07-27 DIAGNOSIS — J329 Chronic sinusitis, unspecified: Secondary | ICD-10-CM | POA: Insufficient documentation

## 2022-07-27 LAB — CBC WITH DIFFERENTIAL (CANCER CENTER ONLY)
Abs Immature Granulocytes: 0.17 10*3/uL — ABNORMAL HIGH (ref 0.00–0.07)
Basophils Absolute: 0 10*3/uL (ref 0.0–0.1)
Basophils Relative: 0 %
Eosinophils Absolute: 0.1 10*3/uL (ref 0.0–0.5)
Eosinophils Relative: 1 %
HCT: 41.8 % (ref 36.0–46.0)
Hemoglobin: 14 g/dL (ref 12.0–15.0)
Immature Granulocytes: 2 %
Lymphocytes Relative: 23 %
Lymphs Abs: 2.1 10*3/uL (ref 0.7–4.0)
MCH: 34.7 pg — ABNORMAL HIGH (ref 26.0–34.0)
MCHC: 33.5 g/dL (ref 30.0–36.0)
MCV: 103.7 fL — ABNORMAL HIGH (ref 80.0–100.0)
Monocytes Absolute: 0.7 10*3/uL (ref 0.1–1.0)
Monocytes Relative: 7 %
Neutro Abs: 6.2 10*3/uL (ref 1.7–7.7)
Neutrophils Relative %: 67 %
Platelet Count: 146 10*3/uL — ABNORMAL LOW (ref 150–400)
RBC: 4.03 MIL/uL (ref 3.87–5.11)
RDW: 12.5 % (ref 11.5–15.5)
WBC Count: 9.3 10*3/uL (ref 4.0–10.5)
nRBC: 0 % (ref 0.0–0.2)

## 2022-07-27 LAB — CMP (CANCER CENTER ONLY)
ALT: 19 U/L (ref 0–44)
AST: 22 U/L (ref 15–41)
Albumin: 4.6 g/dL (ref 3.5–5.0)
Alkaline Phosphatase: 75 U/L (ref 38–126)
Anion gap: 11 (ref 5–15)
BUN: 10 mg/dL (ref 8–23)
CO2: 30 mmol/L (ref 22–32)
Calcium: 9.5 mg/dL (ref 8.9–10.3)
Chloride: 102 mmol/L (ref 98–111)
Creatinine: 0.81 mg/dL (ref 0.44–1.00)
GFR, Estimated: >60 mL/min (ref >60)
Glucose, Bld: 96 mg/dL (ref 70–99)
Potassium: 4 mmol/L (ref 3.5–5.1)
Sodium: 143 mmol/L (ref 135–145)
Total Bilirubin: 1.2 mg/dL (ref 0.3–1.2)
Total Protein: 7.2 g/dL (ref 6.5–8.1)

## 2022-07-27 LAB — LACTATE DEHYDROGENASE: LDH: 209 U/L — ABNORMAL HIGH (ref 98–192)

## 2022-07-27 LAB — VITAMIN B12: Vitamin B-12: 397 pg/mL (ref 180–914)

## 2022-07-27 MED ORDER — DIPHENHYDRAMINE HCL 25 MG PO CAPS: 25.0000 mg | ORAL_CAPSULE | Freq: Once | ORAL | Status: DC

## 2022-07-27 MED ORDER — IMMUNE GLOBULIN (HUMAN) 20 GM/200ML IV SOLN
40.0000 g | Freq: Once | INTRAVENOUS | Status: AC
  Administered 2022-07-27: 40 g via INTRAVENOUS
  Filled 2022-07-27: qty 400

## 2022-07-27 MED ORDER — DEXTROSE 5 % IV SOLN: INTRAVENOUS | Status: DC

## 2022-07-27 MED ORDER — CYANOCOBALAMIN 1000 MCG/ML IJ SOLN
1000.0000 ug | Freq: Once | INTRAMUSCULAR | Status: AC
  Administered 2022-07-27: 1000 ug via INTRAMUSCULAR
  Filled 2022-07-27: qty 1

## 2022-07-27 MED ORDER — ACETAMINOPHEN 325 MG PO TABS: 650.0000 mg | ORAL_TABLET | Freq: Once | ORAL | Status: DC

## 2022-07-27 NOTE — Patient Instructions (Signed)

## 2022-07-27 NOTE — Progress Notes (Signed)
Hematology and Oncology Follow Up Visit  Kristy [name] [id number] Jun 05, 1955 67 y.o. 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  Upon has bought her a little bit.  She has little bit of a sinus problem.  She sounds little bit hoarse.  She is getting better however.  She is looking forward to a nice Mother's Day weekend.  I am sure that her family will do quite a lot for her.  Her husband is still working.  Hopefully, he will be able to retire soon.  She continues on Imbruvica.  She is doing well on the [place].  Her blood pressure is doing quite nicely right now.  Her blood pressure is 139/69.  Her last monoclonal spike was not found in her blood.  Her IgM level was 612 mg/dL.  Her last vitamin B12 level was 264 pg/dL's we  She has had no fever.  There has been no bleeding.  There has been no change in bowel or bladder habits.  She has had no leg swelling.  She has had no rashes.  Overall, I would say that her performance status is probably ECOG 1.  Medications:  Allergies as of 07/27/2022       Reactions   Grapefruit Extract    unknown   Nitrofurantoin Other (See Comments)   Ibuprofen Other (See Comments)   Can not take due to medication        Medication List        Accurate as of Jul 27, 2022  8:19 AM. If you have any questions, ask your nurse or doctor.          STOP taking these medications    triamcinolone cream 0.5 % Commonly known as: KENALOG Stopped by: Josph Macho, MD       TAKE these medications    amLODipine 5 MG tablet Commonly known as: NORVASC Take 5 mg by mouth daily.   Cholecalciferol 25 MCG (1000 UT) tablet Take 1,000 Units by mouth daily.    cyanocobalamin 1000 MCG/ML injection Commonly known as: VITAMIN B12 INJECT 1 ML INTO THE MUSCLE EVERY 2 MONTHS What changed: See the new instructions.   estradiol 0.1 MG/GM vaginal cream Commonly known as: ESTRACE Place 1 Applicatorful vaginally 3 (three) times a week.   Imbruvica 420 MG tablet Generic drug: ibrutinib TAKE 1 TABLET BY MOUTH DAILY AFTER BREAKFAST.   Lifitegrast 5 % Soln Place 1 drop into both eyes 2 (two) times daily.   mupirocin ointment 2 % Commonly known as: BACTROBAN Apply 1 Application topically 3 (three) times daily.   nitrofurantoin (macrocrystal-monohydrate) 100 MG capsule Commonly known as: MACROBID Take 100 mg by mouth daily as needed (For UTI).        Allergies:  Allergies  Allergen Reactions   Grapefruit Extract     unknown   Nitrofurantoin Other (See Comments)   Ibuprofen Other (See Comments)    Can not take due to medication    Past Medical History, Surgical history, Social history, and Family History were reviewed and updated.  Review of Systems: Review of Systems  Constitutional: Negative.   HENT: Negative.    Eyes: Negative.   Respiratory: Negative.    Cardiovascular: Negative.  Gastrointestinal: Negative.   Genitourinary: Negative.   Musculoskeletal: Negative.   Skin: Negative.   Neurological: Negative.   Endo/Heme/Allergies: Negative.   Psychiatric/Behavioral:  The patient is not nervous/anxious.      Physical Exam:  height is 5' (1.524 m) and weight is 103 lb (46.7 kg). Her oral temperature is 97.5 F (36.4 C) (abnormal). Her blood pressure is 139/69 and her pulse is 84. Her respiration is 20 and oxygen saturation is 96%.   Wt Readings from Last 3 Encounters:  07/27/22 103 lb (46.7 kg)  06/15/22 105 lb (47.6 kg)  05/04/22 104 lb (47.2 kg)    Physical Exam Vitals reviewed.  HENT:     Head: Normocephalic and atraumatic.  Eyes:     Pupils: Pupils are equal, round, and reactive to light.  Cardiovascular:      Rate and Rhythm: Normal rate and regular rhythm.     Heart sounds: Normal heart sounds.  Pulmonary:     Effort: Pulmonary effort is normal.     Breath sounds: Normal breath sounds.  Abdominal:     General: Bowel sounds are normal.     Palpations: Abdomen is soft.  Musculoskeletal:        General: No tenderness or deformity. Normal range of motion.     Cervical back: Normal range of motion.  Lymphadenopathy:     Cervical: No cervical adenopathy.  Skin:    General: Skin is warm and dry.     Findings: No erythema or rash.  Neurological:     Mental Status: She is alert and oriented to person, place, and time.  Psychiatric:        Behavior: Behavior normal.        Thought Content: Thought content normal.        Judgment: Judgment normal.      Lab Results  Component Value Date   WBC 9.3 07/27/2022   HGB 14.0 07/27/2022   HCT 41.8 07/27/2022   MCV 103.7 (H) 07/27/2022   PLT 146 (L) 07/27/2022   No results found for: "FERRITIN", "IRON", "TIBC", "UIBC", "IRONPCTSAT" Lab Results  Component Value Date   RETICCTPCT 2.9 (H) 10/30/2014   RBC 4.03 07/27/2022   RETICCTABS 98.3 10/30/2014   Lab Results  Component Value Date   KPAFRELGTCHN 5.7 06/15/2022   LAMBDASER 3.1 (L) 06/15/2022   KAPLAMBRATIO 1.84 (H) 06/15/2022   Lab Results  Component Value Date   IGGSERUM 612 06/15/2022   IGA 10 (L) 06/15/2022   IGMSERUM 75 06/15/2022   Lab Results  Component Value Date   TOTALPROTELP 6.2 06/15/2022   ALBUMINELP 4.1 06/15/2022   A1GS 0.3 06/15/2022   A2GS 0.4 06/15/2022   BETS 0.8 06/15/2022   BETA2SER 0.2 02/15/2015   GAMS 0.6 06/15/2022   MSPIKE Not Observed 06/15/2022   SPEI Comment 05/04/2022     Chemistry      Component Value Date/Time   NA 140 06/15/2022 0813   NA 147 (H) 03/19/2017 0849   NA 139 03/16/2016 1337   K 3.9 06/15/2022 0813   K 4.5 03/19/2017 0849   K 3.8 03/16/2016 1337   CL 102 06/15/2022 0813   CL 108 03/19/2017 0849   CO2 30 06/15/2022 0813    CO2 31 03/19/2017 0849   CO2 27 03/16/2016 1337   BUN 13 06/15/2022 0813   BUN 11 03/19/2017 0849   BUN 5.2 (L) 03/16/2016 1337   CREATININE 0.80 06/15/2022 0813   CREATININE 1.0 03/19/2017 0849   CREATININE 0.7  03/16/2016 1337      Component Value Date/Time   CALCIUM 9.3 06/15/2022 0813   CALCIUM 9.6 03/19/2017 0849   CALCIUM 8.8 03/16/2016 1337   ALKPHOS 64 06/15/2022 0813   ALKPHOS 53 03/19/2017 0849   ALKPHOS 62 03/16/2016 1337   AST 22 06/15/2022 0813   AST 24 03/16/2016 1337   ALT 14 06/15/2022 0813   ALT 23 03/19/2017 0849   ALT 19 03/16/2016 1337   BILITOT 1.3 (H) 06/15/2022 0813   BILITOT 0.57 03/16/2016 1337       Impression and Plan: Ms. Drozda is a very pleasant 67 yo caucasian female with Waldenstrom's with acquired hypogammaglobulinemia and recurrent sinusitis.  Her Waldenstrom's continues to be under excellent control.   We will see what her Waldenstrom's labs look like.  I would have believe that there can be okay.  She will get her IVIG today.  She will get her vitamin B12 today.  I am just happy that everything is going well for her.  I know that this time a year, she does get this upper respiratory infection.  She really does not need any antibiotics.  She is improving.  As always, we will get her back in 6 weeks.    Josph Macho, MD 5/9/20248:19 AM

## 2022-07-27 NOTE — Progress Notes (Signed)
Pt declined to stay for post infusion observation period. Pt stated she has tolerated medication multiple times prior without difficulty. Pt aware to call clinic with any questions or concerns. Pt verbalized understanding and had no further questions.  ? ?

## 2022-07-28 LAB — KAPPA/LAMBDA LIGHT CHAINS
Kappa free light chain: 6 mg/L (ref 3.3–19.4)
Kappa, lambda light chain ratio: 2.07 — ABNORMAL HIGH (ref 0.26–1.65)
Lambda free light chains: 2.9 mg/L — ABNORMAL LOW (ref 5.7–26.3)

## 2022-07-29 LAB — IGG, IGA, IGM
IgA: 11 mg/dL — ABNORMAL LOW (ref 87–352)
IgG (Immunoglobin G), Serum: 662 mg/dL (ref 586–1602)
IgM (Immunoglobulin M), Srm: 66 mg/dL (ref 26–217)

## 2022-08-01 ENCOUNTER — Other Ambulatory Visit (HOSPITAL_COMMUNITY): Payer: Self-pay

## 2022-08-01 LAB — PROTEIN ELECTROPHORESIS, SERUM, WITH REFLEX
A/G Ratio: 1.4 (ref 0.7–1.7)
Albumin ELP: 3.6 g/dL (ref 2.9–4.4)
Alpha-1-Globulin: 0.3 g/dL (ref 0.0–0.4)
Alpha-2-Globulin: 0.8 g/dL (ref 0.4–1.0)
Beta Globulin: 0.9 g/dL (ref 0.7–1.3)
Gamma Globulin: 0.6 g/dL (ref 0.4–1.8)
Globulin, Total: 2.6 g/dL (ref 2.2–3.9)
Total Protein ELP: 6.2 g/dL (ref 6.0–8.5)

## 2022-08-07 ENCOUNTER — Other Ambulatory Visit (HOSPITAL_COMMUNITY): Payer: Self-pay

## 2022-08-29 ENCOUNTER — Other Ambulatory Visit (HOSPITAL_COMMUNITY): Payer: Self-pay

## 2022-08-30 ENCOUNTER — Other Ambulatory Visit (HOSPITAL_COMMUNITY): Payer: Self-pay

## 2022-09-05 ENCOUNTER — Other Ambulatory Visit (HOSPITAL_COMMUNITY): Payer: Self-pay

## 2022-09-07 ENCOUNTER — Encounter: Payer: Self-pay | Admitting: Hematology & Oncology

## 2022-09-07 ENCOUNTER — Inpatient Hospital Stay: Payer: Federal, State, Local not specified - PPO | Attending: Hematology & Oncology

## 2022-09-07 ENCOUNTER — Inpatient Hospital Stay (HOSPITAL_BASED_OUTPATIENT_CLINIC_OR_DEPARTMENT_OTHER): Payer: Federal, State, Local not specified - PPO | Admitting: Hematology & Oncology

## 2022-09-07 ENCOUNTER — Other Ambulatory Visit: Payer: Self-pay

## 2022-09-07 ENCOUNTER — Inpatient Hospital Stay: Payer: Federal, State, Local not specified - PPO

## 2022-09-07 VITALS — BP 118/64 | HR 66 | Temp 98.0°F | Resp 18

## 2022-09-07 VITALS — BP 139/65 | HR 77 | Temp 97.6°F | Resp 16 | Ht 60.0 in | Wt 106.0 lb

## 2022-09-07 DIAGNOSIS — D510 Vitamin B12 deficiency anemia due to intrinsic factor deficiency: Secondary | ICD-10-CM | POA: Insufficient documentation

## 2022-09-07 DIAGNOSIS — D801 Nonfamilial hypogammaglobulinemia: Secondary | ICD-10-CM | POA: Diagnosis present

## 2022-09-07 DIAGNOSIS — C880 Waldenstrom macroglobulinemia: Secondary | ICD-10-CM | POA: Diagnosis present

## 2022-09-07 DIAGNOSIS — J329 Chronic sinusitis, unspecified: Secondary | ICD-10-CM | POA: Diagnosis not present

## 2022-09-07 DIAGNOSIS — J0101 Acute recurrent maxillary sinusitis: Secondary | ICD-10-CM

## 2022-09-07 LAB — CBC WITH DIFFERENTIAL (CANCER CENTER ONLY)
Abs Immature Granulocytes: 0.04 10*3/uL (ref 0.00–0.07)
Basophils Absolute: 0 10*3/uL (ref 0.0–0.1)
Basophils Relative: 1 %
Eosinophils Absolute: 0.1 10*3/uL (ref 0.0–0.5)
Eosinophils Relative: 1 %
HCT: 40.5 % (ref 36.0–46.0)
Hemoglobin: 13.3 g/dL (ref 12.0–15.0)
Immature Granulocytes: 1 %
Lymphocytes Relative: 30 %
Lymphs Abs: 2 10*3/uL (ref 0.7–4.0)
MCH: 34.7 pg — ABNORMAL HIGH (ref 26.0–34.0)
MCHC: 32.8 g/dL (ref 30.0–36.0)
MCV: 105.7 fL — ABNORMAL HIGH (ref 80.0–100.0)
Monocytes Absolute: 0.5 10*3/uL (ref 0.1–1.0)
Monocytes Relative: 7 %
Neutro Abs: 4 10*3/uL (ref 1.7–7.7)
Neutrophils Relative %: 60 %
Platelet Count: 107 10*3/uL — ABNORMAL LOW (ref 150–400)
RBC: 3.83 MIL/uL — ABNORMAL LOW (ref 3.87–5.11)
RDW: 12.9 % (ref 11.5–15.5)
WBC Count: 6.7 10*3/uL (ref 4.0–10.5)
nRBC: 0 % (ref 0.0–0.2)

## 2022-09-07 LAB — CMP (CANCER CENTER ONLY)
ALT: 10 U/L (ref 0–44)
AST: 20 U/L (ref 15–41)
Albumin: 4.2 g/dL (ref 3.5–5.0)
Alkaline Phosphatase: 63 U/L (ref 38–126)
Anion gap: 6 (ref 5–15)
BUN: 10 mg/dL (ref 8–23)
CO2: 29 mmol/L (ref 22–32)
Calcium: 8.9 mg/dL (ref 8.9–10.3)
Chloride: 104 mmol/L (ref 98–111)
Creatinine: 0.78 mg/dL (ref 0.44–1.00)
GFR, Estimated: >60 mL/min (ref >60)
Glucose, Bld: 90 mg/dL (ref 70–99)
Potassium: 4.6 mmol/L (ref 3.5–5.1)
Sodium: 139 mmol/L (ref 135–145)
Total Bilirubin: 0.9 mg/dL (ref 0.3–1.2)
Total Protein: 6 g/dL — ABNORMAL LOW (ref 6.5–8.1)

## 2022-09-07 LAB — VITAMIN B12: Vitamin B-12: 336 pg/mL (ref 180–914)

## 2022-09-07 LAB — LACTATE DEHYDROGENASE: LDH: 175 U/L (ref 98–192)

## 2022-09-07 MED ORDER — ACETAMINOPHEN 325 MG PO TABS: 650.0000 mg | ORAL_TABLET | Freq: Once | ORAL | Status: DC

## 2022-09-07 MED ORDER — DEXTROSE 5 % IV SOLN: INTRAVENOUS | Status: DC

## 2022-09-07 MED ORDER — CYANOCOBALAMIN 1000 MCG/ML IJ SOLN
1000.0000 ug | Freq: Once | INTRAMUSCULAR | Status: AC
  Administered 2022-09-07: 1000 ug via INTRAMUSCULAR
  Filled 2022-09-07: qty 1

## 2022-09-07 MED ORDER — DIPHENHYDRAMINE HCL 25 MG PO CAPS: 25.0000 mg | ORAL_CAPSULE | Freq: Once | ORAL | Status: DC

## 2022-09-07 MED ORDER — IMMUNE GLOBULIN (HUMAN) 20 GM/200ML IV SOLN
40.0000 g | Freq: Once | INTRAVENOUS | Status: AC
  Administered 2022-09-07: 40 g via INTRAVENOUS
  Filled 2022-09-07: qty 400

## 2022-09-07 NOTE — Progress Notes (Signed)
Hematology and Oncology Follow Up Visit  Kristy [name] [id number] [birth year] 67 y.o. 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  She does feel much better.  She had little bit of laryngitis but that she got over this.  Not very happy about this.  She is doing well with the Imbruvica.  There is no monoclonal spike less than that we saw her.  Her IgM level was 66 mg/dL.  She has had no problems with fever.  There is no nausea or vomiting.  She has had no change in bowel or bladder habits.  She has had some dental work done.  She needs some more than July.  Her mother is doing well up in [place].  She is not sure when she will be able to get up there to see her mom.  She has had no leg swelling.    Currently, I would say that her performance status is probably ECOG 0.   He is Medications:  Allergies as of 09/07/2022       Reactions   Grapefruit Extract    unknown   Nitrofurantoin Other (See Comments)   Ibuprofen Other (See Comments)   Can not take due to medication        Medication List        Accurate as of September 07, 2022  9:18 AM. If you have any questions, ask your nurse or doctor.          amLODipine 5 MG tablet Commonly known as: NORVASC Take 5 mg by mouth daily.   Cholecalciferol 25 MCG (1000 UT) tablet Take 1,000 Units by mouth daily.   cyanocobalamin 1000 MCG/ML injection Commonly known as: VITAMIN B12 INJECT 1 ML INTO THE MUSCLE EVERY 2 MONTHS What changed: See the new instructions.   estradiol 0.1 MG/GM vaginal cream Commonly known as: ESTRACE Place 1 Applicatorful vaginally 3 (three) times a week.   Imbruvica 420 MG tablet Generic drug: ibrutinib TAKE 1  TABLET BY MOUTH DAILY AFTER BREAKFAST.   Lifitegrast 5 % Soln Place 1 drop into both eyes 2 (two) times daily.   mupirocin ointment 2 % Commonly known as: BACTROBAN Apply 1 Application topically 3 (three) times daily.   nitrofurantoin (macrocrystal-monohydrate) 100 MG capsule Commonly known as: MACROBID Take 100 mg by mouth daily as needed (For UTI).        Allergies:  Allergies  Allergen Reactions   Grapefruit Extract     unknown   Nitrofurantoin Other (See Comments)   Ibuprofen Other (See Comments)    Can not take due to medication    Past Medical History, Surgical history, Social history, and Family History were reviewed and updated.  Review of Systems: Review of Systems  Constitutional: Negative.   HENT: Negative.    Eyes: Negative.   Respiratory: Negative.    Cardiovascular: Negative.   Gastrointestinal: Negative.   Genitourinary: Negative.   Musculoskeletal: Negative.   Skin: Negative.   Neurological: Negative.   Endo/Heme/Allergies: Negative.   Psychiatric/Behavioral:  The patient is not nervous/anxious.      Physical Exam:  height is 5' (1.524 m) and weight is 106 lb (48.1 kg). Her oral temperature is 97.6 F (36.4 C). Her blood  pressure is 139/65 and her pulse is 77. Her respiration is 16 and oxygen saturation is 98%.   Wt Readings from Last 3 Encounters:  09/07/22 106 lb (48.1 kg)  07/27/22 103 lb (46.7 kg)  06/15/22 105 lb (47.6 kg)    Physical Exam Vitals reviewed.  HENT:     Head: Normocephalic and atraumatic.  Eyes:     Pupils: Pupils are equal, round, and reactive to light.  Cardiovascular:     Rate and Rhythm: Normal rate and regular rhythm.     Heart sounds: Normal heart sounds.  Pulmonary:     Effort: Pulmonary effort is normal.     Breath sounds: Normal breath sounds.  Abdominal:     General: Bowel sounds are normal.     Palpations: Abdomen is soft.  Musculoskeletal:        General: No tenderness or deformity. Normal range of  motion.     Cervical back: Normal range of motion.  Lymphadenopathy:     Cervical: No cervical adenopathy.  Skin:    General: Skin is warm and dry.     Findings: No erythema or rash.  Neurological:     Mental Status: She is alert and oriented to person, place, and time.  Psychiatric:        Behavior: Behavior normal.        Thought Content: Thought content normal.        Judgment: Judgment normal.      Lab Results  Component Value Date   WBC 6.7 09/07/2022   HGB 13.3 09/07/2022   HCT 40.5 09/07/2022   MCV 105.7 (H) 09/07/2022   PLT 107 (L) 09/07/2022   No results found for: "FERRITIN", "IRON", "TIBC", "UIBC", "IRONPCTSAT" Lab Results  Component Value Date   RETICCTPCT 2.9 (H) 10/30/2014   RBC 3.83 (L) 09/07/2022   RETICCTABS 98.3 10/30/2014   Lab Results  Component Value Date   KPAFRELGTCHN 6.0 07/27/2022   LAMBDASER 2.9 (L) 07/27/2022   KAPLAMBRATIO 2.07 (H) 07/27/2022   Lab Results  Component Value Date   IGGSERUM 662 07/27/2022   IGA 11 (L) 07/27/2022   IGMSERUM 66 07/27/2022   Lab Results  Component Value Date   TOTALPROTELP 6.2 07/27/2022   ALBUMINELP 3.6 07/27/2022   A1GS 0.3 07/27/2022   A2GS 0.8 07/27/2022   BETS 0.9 07/27/2022   BETA2SER 0.2 02/15/2015   GAMS 0.6 07/27/2022   MSPIKE Not Observed 07/27/2022   SPEI Comment 05/04/2022     Chemistry      Component Value Date/Time   NA 139 09/07/2022 0829   NA 147 (H) 03/19/2017 0849   NA 139 03/16/2016 1337   K 4.6 09/07/2022 0829   K 4.5 03/19/2017 0849   K 3.8 03/16/2016 1337   CL 104 09/07/2022 0829   CL 108 03/19/2017 0849   CO2 29 09/07/2022 0829   CO2 31 03/19/2017 0849   CO2 27 03/16/2016 1337   BUN 10 09/07/2022 0829   BUN 11 03/19/2017 0849   BUN 5.2 (L) 03/16/2016 1337   CREATININE 0.78 09/07/2022 0829   CREATININE 1.0 03/19/2017 0849   CREATININE 0.7 03/16/2016 1337      Component Value Date/Time   CALCIUM 8.9 09/07/2022 0829   CALCIUM 9.6 03/19/2017 0849   CALCIUM 8.8  03/16/2016 1337   ALKPHOS 63 09/07/2022 0829   ALKPHOS 53 03/19/2017 0849   ALKPHOS 62 03/16/2016 1337   AST 20 09/07/2022 0829   AST 24 03/16/2016 1337   ALT  10 09/07/2022 0829   ALT 23 03/19/2017 0849   ALT 19 03/16/2016 1337   BILITOT 0.9 09/07/2022 0829   BILITOT 0.57 03/16/2016 1337       Impression and Plan: Kristy [name] is a very pleasant 67 yo caucasian female with Waldenstrom's with acquired hypogammaglobulinemia and recurrent sinusitis.  Her Waldenstrom's continues to be under excellent control.   We will see what her Waldenstrom's labs look like.  I would have believe that there can be okay.  She will get her IVIG today.  She will get her vitamin B12 today.  I am just happy that everything is going well for her.  I know that this time a year, she does get this upper respiratory infection.  She really does not need any antibiotics.  She is improving.  As always, we will get her back in 6 weeks.    Josph Macho, MD 6/20/20249:18 AM

## 2022-09-07 NOTE — Patient Instructions (Signed)

## 2022-09-08 LAB — IGG, IGA, IGM
IgA: 9 mg/dL — ABNORMAL LOW (ref 87–352)
IgG (Immunoglobin G), Serum: 598 mg/dL (ref 586–1602)
IgM (Immunoglobulin M), Srm: 57 mg/dL (ref 26–217)

## 2022-09-08 LAB — KAPPA/LAMBDA LIGHT CHAINS
Kappa free light chain: 4.5 mg/L (ref 3.3–19.4)
Kappa, lambda light chain ratio: 1.61 (ref 0.26–1.65)
Lambda free light chains: 2.8 mg/L — ABNORMAL LOW (ref 5.7–26.3)

## 2022-10-03 ENCOUNTER — Other Ambulatory Visit (HOSPITAL_COMMUNITY): Payer: Self-pay

## 2022-10-04 ENCOUNTER — Other Ambulatory Visit (HOSPITAL_COMMUNITY): Payer: Self-pay

## 2022-10-12 ENCOUNTER — Encounter: Payer: Self-pay | Admitting: Medical Oncology

## 2022-10-12 ENCOUNTER — Other Ambulatory Visit: Payer: Self-pay

## 2022-10-12 ENCOUNTER — Inpatient Hospital Stay: Payer: Federal, State, Local not specified - PPO

## 2022-10-12 ENCOUNTER — Inpatient Hospital Stay (HOSPITAL_BASED_OUTPATIENT_CLINIC_OR_DEPARTMENT_OTHER): Payer: Federal, State, Local not specified - PPO | Admitting: Medical Oncology

## 2022-10-12 ENCOUNTER — Inpatient Hospital Stay: Payer: Federal, State, Local not specified - PPO | Attending: Hematology & Oncology

## 2022-10-12 VITALS — BP 119/55 | HR 69 | Temp 97.1°F | Resp 18 | Ht 60.0 in | Wt 104.1 lb

## 2022-10-12 VITALS — BP 123/68 | HR 70

## 2022-10-12 DIAGNOSIS — C880 Waldenstrom macroglobulinemia: Secondary | ICD-10-CM | POA: Diagnosis present

## 2022-10-12 DIAGNOSIS — J329 Chronic sinusitis, unspecified: Secondary | ICD-10-CM | POA: Insufficient documentation

## 2022-10-12 DIAGNOSIS — D510 Vitamin B12 deficiency anemia due to intrinsic factor deficiency: Secondary | ICD-10-CM

## 2022-10-12 DIAGNOSIS — D801 Nonfamilial hypogammaglobulinemia: Secondary | ICD-10-CM | POA: Diagnosis present

## 2022-10-12 DIAGNOSIS — J0101 Acute recurrent maxillary sinusitis: Secondary | ICD-10-CM

## 2022-10-12 LAB — CBC WITH DIFFERENTIAL (CANCER CENTER ONLY)
Abs Immature Granulocytes: 0.06 10*3/uL (ref 0.00–0.07)
Basophils Absolute: 0 10*3/uL (ref 0.0–0.1)
Basophils Relative: 0 %
Eosinophils Absolute: 0.1 10*3/uL (ref 0.0–0.5)
Eosinophils Relative: 1 %
HCT: 41.3 % (ref 36.0–46.0)
Hemoglobin: 13.8 g/dL (ref 12.0–15.0)
Immature Granulocytes: 1 %
Lymphocytes Relative: 28 %
Lymphs Abs: 1.6 10*3/uL (ref 0.7–4.0)
MCH: 34.9 pg — ABNORMAL HIGH (ref 26.0–34.0)
MCHC: 33.4 g/dL (ref 30.0–36.0)
MCV: 104.6 fL — ABNORMAL HIGH (ref 80.0–100.0)
Monocytes Absolute: 0.4 10*3/uL (ref 0.1–1.0)
Monocytes Relative: 8 %
Neutro Abs: 3.4 10*3/uL (ref 1.7–7.7)
Neutrophils Relative %: 62 %
Platelet Count: 88 10*3/uL — ABNORMAL LOW (ref 150–400)
RBC: 3.95 MIL/uL (ref 3.87–5.11)
RDW: 12.2 % (ref 11.5–15.5)
WBC Count: 5.6 10*3/uL (ref 4.0–10.5)
nRBC: 0 % (ref 0.0–0.2)

## 2022-10-12 LAB — CMP (CANCER CENTER ONLY)
ALT: 17 U/L (ref 0–44)
AST: 24 U/L (ref 15–41)
Albumin: 4.6 g/dL (ref 3.5–5.0)
Alkaline Phosphatase: 65 U/L (ref 38–126)
Anion gap: 9 (ref 5–15)
BUN: 10 mg/dL (ref 8–23)
CO2: 29 mmol/L (ref 22–32)
Calcium: 9.3 mg/dL (ref 8.9–10.3)
Chloride: 100 mmol/L (ref 98–111)
Creatinine: 0.77 mg/dL (ref 0.44–1.00)
GFR, Estimated: >60 mL/min (ref >60)
Glucose, Bld: 85 mg/dL (ref 70–99)
Potassium: 3.8 mmol/L (ref 3.5–5.1)
Sodium: 138 mmol/L (ref 135–145)
Total Bilirubin: 1.4 mg/dL — ABNORMAL HIGH (ref 0.3–1.2)
Total Protein: 6.6 g/dL (ref 6.5–8.1)

## 2022-10-12 LAB — LACTATE DEHYDROGENASE: LDH: 200 U/L — ABNORMAL HIGH (ref 98–192)

## 2022-10-12 LAB — VITAMIN B12: Vitamin B-12: 309 pg/mL (ref 180–914)

## 2022-10-12 MED ORDER — ACETAMINOPHEN 325 MG PO TABS: 650.0000 mg | ORAL_TABLET | Freq: Once | ORAL | Status: DC

## 2022-10-12 MED ORDER — DEXTROSE 5 % IV SOLN: INTRAVENOUS | Status: DC

## 2022-10-12 MED ORDER — DIPHENHYDRAMINE HCL 25 MG PO CAPS: 25.0000 mg | ORAL_CAPSULE | Freq: Once | ORAL | Status: DC

## 2022-10-12 MED ORDER — CYANOCOBALAMIN 1000 MCG/ML IJ SOLN
1000.0000 ug | Freq: Once | INTRAMUSCULAR | Status: AC
  Administered 2022-10-12: 1000 ug via INTRAMUSCULAR
  Filled 2022-10-12: qty 1

## 2022-10-12 MED ORDER — IMMUNE GLOBULIN (HUMAN) 40 GM/400ML IV SOLN
40.0000 g | Freq: Once | INTRAVENOUS | Status: AC
  Administered 2022-10-12: 40 g via INTRAVENOUS
  Filled 2022-10-12: qty 400

## 2022-10-12 NOTE — Patient Instructions (Signed)

## 2022-10-12 NOTE — Progress Notes (Signed)
Hematology and Oncology Follow Up Visit  Kristy [name] [id number] Jun 25, 1955 67 y.o. 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    She reports that she has been doing well since her last visit. She has not had any recent or recurrent illnesses.   She is doing well with the Imbruvica.  There is no monoclonal spike less than that we saw her.  Her IgM level was 57 mg/dL.  She has had no problems with fever.  There is no nausea or vomiting.  She has had no change in bowel or bladder habits.   Since her last visit she has had a crown placed on a posterior tooth. This went well. Down two pounds but eating well now.   She has had no leg swelling.    No bleeding or bruising episodes.   Currently, I would say that her performance status is probably ECOG 0.   Wt Readings from Last 3 Encounters:  10/12/22 104 lb 1.3 oz (47.2 kg)  09/07/22 106 lb (48.1 kg)  07/27/22 103 lb (46.7 kg)    Medications:  Allergies as of 10/12/2022       Reactions   Grapefruit Extract Other (See Comments)   Can not take due to Imbruvica.   Ibuprofen Other (See Comments)   Can not take due to [facility]        Medication List        Accurate as of October 12, 2022  9:34 AM. If you have any questions, ask your nurse or doctor.          STOP taking these medications    mupirocin ointment 2 % Commonly known as: BACTROBAN Stopped by: Rushie Chestnut       TAKE these medications    amLODipine 5 MG tablet Commonly known as: NORVASC Take 5 mg by mouth daily.   Cholecalciferol 25 MCG (1000 UT) tablet Take 1,000 Units by mouth daily.   cyanocobalamin 1000 MCG/ML injection Commonly known as: VITAMIN  B12 INJECT 1 ML INTO THE MUSCLE EVERY 2 MONTHS What changed: See the new instructions.   estradiol 0.1 MG/GM vaginal cream Commonly known as: ESTRACE Place 1 Applicatorful vaginally 3 (three) times a week.   Imbruvica 420 MG tablet Generic drug: ibrutinib TAKE 1 TABLET BY MOUTH DAILY AFTER BREAKFAST.   Lifitegrast 5 % Soln Place 1 drop into both eyes 2 (two) times daily.   nitrofurantoin (macrocrystal-monohydrate) 100 MG capsule Commonly known as: MACROBID Take 100 mg by mouth daily as needed (For UTI).        Allergies:  Allergies  Allergen Reactions   Grapefruit Extract Other (See Comments)    Can not take due to Imbruvica.   Ibuprofen Other (See Comments)    Can not take due to Imbruvica    Past Medical History, Surgical history, Social history, and Family History were reviewed and updated.  Review of Systems: Review of Systems  Constitutional: Negative.   HENT: Negative.    Eyes: Negative.   Respiratory: Negative.    Cardiovascular: Negative.   Gastrointestinal: Negative.   Genitourinary: Negative.   Musculoskeletal: Negative.   Skin: Negative.   Neurological: Negative.   Endo/Heme/Allergies: Negative.   Psychiatric/Behavioral:  The  patient is not nervous/anxious.      Physical Exam:  height is 5' (1.524 m) and weight is 104 lb 1.3 oz (47.2 kg). Her oral temperature is 97.1 F (36.2 C) (abnormal). Her blood pressure is 119/55 (abnormal) and her pulse is 69. Her respiration is 18 and oxygen saturation is 99%.   Wt Readings from Last 3 Encounters:  10/12/22 104 lb 1.3 oz (47.2 kg)  09/07/22 106 lb (48.1 kg)  07/27/22 103 lb (46.7 kg)    Physical Exam Vitals reviewed.  HENT:     Head: Normocephalic and atraumatic.  Eyes:     Pupils: Pupils are equal, round, and reactive to light.  Cardiovascular:     Rate and Rhythm: Normal rate and regular rhythm.     Heart sounds: Normal heart sounds.  Pulmonary:     Effort: Pulmonary effort is normal.      Breath sounds: Normal breath sounds.  Abdominal:     General: Bowel sounds are normal.     Palpations: Abdomen is soft.  Musculoskeletal:        General: No tenderness or deformity. Normal range of motion.     Cervical back: Normal range of motion.  Lymphadenopathy:     Cervical: No cervical adenopathy.  Skin:    General: Skin is warm and dry.     Findings: No erythema or rash.  Neurological:     Mental Status: She is alert and oriented to person, place, and time.  Psychiatric:        Behavior: Behavior normal.        Thought Content: Thought content normal.        Judgment: Judgment normal.      Lab Results  Component Value Date   WBC 5.6 10/12/2022   HGB 13.8 10/12/2022   HCT 41.3 10/12/2022   MCV 104.6 (H) 10/12/2022   PLT 88 (L) 10/12/2022   No results found for: "FERRITIN", "IRON", "TIBC", "UIBC", "IRONPCTSAT" Lab Results  Component Value Date   RETICCTPCT 2.9 (H) 10/30/2014   RBC 3.95 10/12/2022   RETICCTABS 98.3 10/30/2014   Lab Results  Component Value Date   KPAFRELGTCHN 4.5 09/07/2022   LAMBDASER 2.8 (L) 09/07/2022   KAPLAMBRATIO 1.61 09/07/2022   Lab Results  Component Value Date   IGGSERUM 598 09/07/2022   IGA 9 (L) 09/07/2022   IGMSERUM 57 09/07/2022   Lab Results  Component Value Date   TOTALPROTELP 6.2 07/27/2022   ALBUMINELP 3.6 07/27/2022   A1GS 0.3 07/27/2022   A2GS 0.8 07/27/2022   BETS 0.9 07/27/2022   BETA2SER 0.2 02/15/2015   GAMS 0.6 07/27/2022   MSPIKE Not Observed 07/27/2022   SPEI Comment 05/04/2022     Chemistry      Component Value Date/Time   NA 138 10/12/2022 0842   NA 147 (H) 03/19/2017 0849   NA 139 03/16/2016 1337   K 3.8 10/12/2022 0842   K 4.5 03/19/2017 0849   K 3.8 03/16/2016 1337   CL 100 10/12/2022 0842   CL 108 03/19/2017 0849   CO2 29 10/12/2022 0842   CO2 31 03/19/2017 0849   CO2 27 03/16/2016 1337   BUN 10 10/12/2022 0842   BUN 11 03/19/2017 0849   BUN 5.2 (L) 03/16/2016 1337   CREATININE 0.77  10/12/2022 0842   CREATININE 1.0 03/19/2017 0849   CREATININE 0.7 03/16/2016 1337      Component Value Date/Time   CALCIUM 9.3 10/12/2022 0842   CALCIUM 9.6 03/19/2017 0849  CALCIUM 8.8 03/16/2016 1337   ALKPHOS 65 10/12/2022 0842   ALKPHOS 53 03/19/2017 0849   ALKPHOS 62 03/16/2016 1337   AST 24 10/12/2022 0842   AST 24 03/16/2016 1337   ALT 17 10/12/2022 0842   ALT 23 03/19/2017 0849   ALT 19 03/16/2016 1337   BILITOT 1.4 (H) 10/12/2022 0842   BILITOT 0.57 03/16/2016 1337       Impression and Plan: Kristy [name] is a very pleasant 67 yo caucasian female with Waldenstrom's with acquired hypogammaglobulinemia and recurrent sinusitis.  Her Waldenstrom's continues to be under excellent control.   Labs pending. We reviewed and discussed her thrombocytopenia- platelets 88 today down from 107. Possibly reactive but we will monitor closely. Discussed red flag sign and symptoms.   She will get her IVIG today.  She will get her vitamin B12 today.   Disposition IVIG and B12 today RTC 6 weeks MD, labs (CBC w/, CMP, IgG/IgA/IgM, light chains, LDH, spep, B12, folate)-Jennette    Rushie Chestnut, [place] 7/25/20249:34 AM

## 2022-10-19 ENCOUNTER — Ambulatory Visit: Payer: Federal, State, Local not specified - PPO | Admitting: Hematology & Oncology

## 2022-10-19 ENCOUNTER — Other Ambulatory Visit: Payer: Federal, State, Local not specified - PPO

## 2022-10-19 ENCOUNTER — Ambulatory Visit: Payer: Federal, State, Local not specified - PPO

## 2022-10-30 ENCOUNTER — Other Ambulatory Visit (HOSPITAL_COMMUNITY): Payer: Self-pay

## 2022-11-01 ENCOUNTER — Other Ambulatory Visit: Payer: Self-pay

## 2022-11-02 ENCOUNTER — Other Ambulatory Visit: Payer: Self-pay

## 2022-11-03 ENCOUNTER — Other Ambulatory Visit (HOSPITAL_COMMUNITY): Payer: Self-pay

## 2022-11-22 ENCOUNTER — Other Ambulatory Visit: Payer: Self-pay | Admitting: *Deleted

## 2022-11-22 DIAGNOSIS — C880 Waldenstrom macroglobulinemia: Secondary | ICD-10-CM

## 2022-11-23 ENCOUNTER — Inpatient Hospital Stay: Payer: Federal, State, Local not specified - PPO

## 2022-11-23 ENCOUNTER — Inpatient Hospital Stay: Payer: Federal, State, Local not specified - PPO | Attending: Hematology & Oncology

## 2022-11-23 ENCOUNTER — Inpatient Hospital Stay: Payer: Federal, State, Local not specified - PPO | Admitting: Hematology & Oncology

## 2022-11-23 VITALS — BP 103/62 | HR 72 | Temp 97.9°F | Resp 18

## 2022-11-23 DIAGNOSIS — C880 Waldenstrom macroglobulinemia: Secondary | ICD-10-CM | POA: Diagnosis present

## 2022-11-23 DIAGNOSIS — D801 Nonfamilial hypogammaglobulinemia: Secondary | ICD-10-CM | POA: Insufficient documentation

## 2022-11-23 DIAGNOSIS — J329 Chronic sinusitis, unspecified: Secondary | ICD-10-CM | POA: Insufficient documentation

## 2022-11-23 DIAGNOSIS — J0101 Acute recurrent maxillary sinusitis: Secondary | ICD-10-CM

## 2022-11-23 DIAGNOSIS — D510 Vitamin B12 deficiency anemia due to intrinsic factor deficiency: Secondary | ICD-10-CM | POA: Diagnosis present

## 2022-11-23 LAB — COMPREHENSIVE METABOLIC PANEL
ALT: 18 U/L (ref 0–44)
AST: 25 U/L (ref 15–41)
Albumin: 4.3 g/dL (ref 3.5–5.0)
Alkaline Phosphatase: 64 U/L (ref 38–126)
Anion gap: 8 (ref 5–15)
BUN: 9 mg/dL (ref 8–23)
CO2: 30 mmol/L (ref 22–32)
Calcium: 9.2 mg/dL (ref 8.9–10.3)
Chloride: 103 mmol/L (ref 98–111)
Creatinine, Ser: 0.78 mg/dL (ref 0.44–1.00)
GFR, Estimated: >60 mL/min (ref >60)
Glucose, Bld: 95 mg/dL (ref 70–99)
Potassium: 3.9 mmol/L (ref 3.5–5.1)
Sodium: 141 mmol/L (ref 135–145)
Total Bilirubin: 1.1 mg/dL (ref 0.3–1.2)
Total Protein: 6.4 g/dL — ABNORMAL LOW (ref 6.5–8.1)

## 2022-11-23 LAB — CBC WITH DIFFERENTIAL (CANCER CENTER ONLY)
Abs Immature Granulocytes: 0.07 10*3/uL (ref 0.00–0.07)
Basophils Absolute: 0 10*3/uL (ref 0.0–0.1)
Basophils Relative: 1 %
Eosinophils Absolute: 0.1 10*3/uL (ref 0.0–0.5)
Eosinophils Relative: 1 %
HCT: 41.6 % (ref 36.0–46.0)
Hemoglobin: 13.9 g/dL (ref 12.0–15.0)
Immature Granulocytes: 1 %
Lymphocytes Relative: 28 %
Lymphs Abs: 2.2 10*3/uL (ref 0.7–4.0)
MCH: 35 pg — ABNORMAL HIGH (ref 26.0–34.0)
MCHC: 33.4 g/dL (ref 30.0–36.0)
MCV: 104.8 fL — ABNORMAL HIGH (ref 80.0–100.0)
Monocytes Absolute: 0.5 10*3/uL (ref 0.1–1.0)
Monocytes Relative: 6 %
Neutro Abs: 5 10*3/uL (ref 1.7–7.7)
Neutrophils Relative %: 63 %
Platelet Count: 104 10*3/uL — ABNORMAL LOW (ref 150–400)
RBC: 3.97 MIL/uL (ref 3.87–5.11)
RDW: 12.3 % (ref 11.5–15.5)
WBC Count: 7.9 10*3/uL (ref 4.0–10.5)
nRBC: 0 % (ref 0.0–0.2)

## 2022-11-23 LAB — LACTATE DEHYDROGENASE: LDH: 187 U/L (ref 98–192)

## 2022-11-23 MED ORDER — DEXTROSE 5 % IV SOLN: INTRAVENOUS | Status: DC

## 2022-11-23 MED ORDER — ACETAMINOPHEN 325 MG PO TABS: 650.0000 mg | ORAL_TABLET | Freq: Once | ORAL | Status: DC

## 2022-11-23 MED ORDER — DIPHENHYDRAMINE HCL 25 MG PO CAPS: 25.0000 mg | ORAL_CAPSULE | Freq: Once | ORAL | Status: DC

## 2022-11-23 MED ORDER — IMMUNE GLOBULIN (HUMAN) 20 GM/200ML IV SOLN
40.0000 g | Freq: Once | INTRAVENOUS | Status: AC
  Administered 2022-11-23: 40 g via INTRAVENOUS
  Filled 2022-11-23: qty 400

## 2022-11-23 MED ORDER — CYANOCOBALAMIN 1000 MCG/ML IJ SOLN
1000.0000 ug | Freq: Once | INTRAMUSCULAR | Status: AC
  Administered 2022-11-23: 1000 ug via INTRAMUSCULAR
  Filled 2022-11-23: qty 1

## 2022-11-23 NOTE — Patient Instructions (Addendum)

## 2022-11-23 NOTE — Progress Notes (Signed)
Patient does not want to stay for the 30 minute post IVIG observation. Patient discharged ambulatory without complaints or concerns.  

## 2022-11-24 LAB — KAPPA/LAMBDA LIGHT CHAINS
Kappa free light chain: 4.9 mg/L (ref 3.3–19.4)
Kappa, lambda light chain ratio: 1.88 — ABNORMAL HIGH (ref 0.26–1.65)
Lambda free light chains: 2.6 mg/L — ABNORMAL LOW (ref 5.7–26.3)

## 2022-11-25 LAB — IGG, IGA, IGM
IgA: 9 mg/dL — ABNORMAL LOW (ref 87–352)
IgG (Immunoglobin G), Serum: 682 mg/dL (ref 586–1602)
IgM (Immunoglobulin M), Srm: 54 mg/dL (ref 26–217)

## 2022-11-27 ENCOUNTER — Other Ambulatory Visit (HOSPITAL_COMMUNITY): Payer: Self-pay

## 2022-11-27 LAB — PROTEIN ELECTROPHORESIS, SERUM, WITH REFLEX
A/G Ratio: 1.7 (ref 0.7–1.7)
Albumin ELP: 3.8 g/dL (ref 2.9–4.4)
Alpha-1-Globulin: 0.2 g/dL (ref 0.0–0.4)
Alpha-2-Globulin: 0.5 g/dL (ref 0.4–1.0)
Beta Globulin: 0.8 g/dL (ref 0.7–1.3)
Gamma Globulin: 0.6 g/dL (ref 0.4–1.8)
Globulin, Total: 2.2 g/dL (ref 2.2–3.9)
Total Protein ELP: 6 g/dL (ref 6.0–8.5)

## 2022-11-30 ENCOUNTER — Other Ambulatory Visit (HOSPITAL_COMMUNITY): Payer: Self-pay

## 2022-11-30 ENCOUNTER — Telehealth: Payer: Self-pay

## 2022-11-30 NOTE — Telephone Encounter (Signed)
Notified patient of prior authorization approval for Imbruvica 420mg  Tablets. Medication is approved through 11/30/2023. No other needs or concerns voiced at this time.

## 2022-12-01 ENCOUNTER — Other Ambulatory Visit (HOSPITAL_COMMUNITY): Payer: Self-pay

## 2022-12-04 ENCOUNTER — Other Ambulatory Visit (HOSPITAL_COMMUNITY): Payer: Self-pay

## 2022-12-27 ENCOUNTER — Other Ambulatory Visit: Payer: Self-pay

## 2022-12-27 NOTE — Progress Notes (Signed)
Specialty Pharmacy Refill Coordination Note  Kristy [name] is a 67 y.o. female contacted today regarding refills of specialty medication(s) Ibrutinib   Patient requested Delivery   Delivery date: 01/02/23   Verified address: [address]   Medication will be filled on 01/01/23.

## 2023-01-01 ENCOUNTER — Other Ambulatory Visit: Payer: Self-pay

## 2023-01-03 ENCOUNTER — Other Ambulatory Visit: Payer: Self-pay

## 2023-01-03 DIAGNOSIS — C8800 Waldenstrom macroglobulinemia not having achieved remission: Secondary | ICD-10-CM

## 2023-01-04 ENCOUNTER — Inpatient Hospital Stay: Payer: Federal, State, Local not specified - PPO | Attending: Hematology & Oncology

## 2023-01-04 ENCOUNTER — Encounter: Payer: Self-pay | Admitting: Hematology & Oncology

## 2023-01-04 ENCOUNTER — Inpatient Hospital Stay: Payer: Federal, State, Local not specified - PPO

## 2023-01-04 ENCOUNTER — Other Ambulatory Visit: Payer: Self-pay

## 2023-01-04 ENCOUNTER — Inpatient Hospital Stay (HOSPITAL_BASED_OUTPATIENT_CLINIC_OR_DEPARTMENT_OTHER): Payer: Federal, State, Local not specified - PPO | Admitting: Hematology & Oncology

## 2023-01-04 VITALS — BP 122/54 | HR 73 | Temp 97.5°F | Resp 17

## 2023-01-04 VITALS — BP 150/64 | HR 74 | Temp 97.6°F | Resp 18 | Ht 60.0 in | Wt 106.1 lb

## 2023-01-04 DIAGNOSIS — D801 Nonfamilial hypogammaglobulinemia: Secondary | ICD-10-CM | POA: Insufficient documentation

## 2023-01-04 DIAGNOSIS — C8800 Waldenstrom macroglobulinemia not having achieved remission: Secondary | ICD-10-CM | POA: Diagnosis present

## 2023-01-04 DIAGNOSIS — J0101 Acute recurrent maxillary sinusitis: Secondary | ICD-10-CM

## 2023-01-04 DIAGNOSIS — J329 Chronic sinusitis, unspecified: Secondary | ICD-10-CM | POA: Insufficient documentation

## 2023-01-04 DIAGNOSIS — D510 Vitamin B12 deficiency anemia due to intrinsic factor deficiency: Secondary | ICD-10-CM | POA: Insufficient documentation

## 2023-01-04 LAB — CBC WITH DIFFERENTIAL (CANCER CENTER ONLY)
Abs Immature Granulocytes: 0.07 10*3/uL (ref 0.00–0.07)
Basophils Absolute: 0 10*3/uL (ref 0.0–0.1)
Basophils Relative: 0 %
Eosinophils Absolute: 0.1 10*3/uL (ref 0.0–0.5)
Eosinophils Relative: 1 %
HCT: 43.9 % (ref 36.0–46.0)
Hemoglobin: 15 g/dL (ref 12.0–15.0)
Immature Granulocytes: 1 %
Lymphocytes Relative: 24 %
Lymphs Abs: 2.1 10*3/uL (ref 0.7–4.0)
MCH: 35 pg — ABNORMAL HIGH (ref 26.0–34.0)
MCHC: 34.2 g/dL (ref 30.0–36.0)
MCV: 102.3 fL — ABNORMAL HIGH (ref 80.0–100.0)
Monocytes Absolute: 0.4 10*3/uL (ref 0.1–1.0)
Monocytes Relative: 4 %
Neutro Abs: 5.9 10*3/uL (ref 1.7–7.7)
Neutrophils Relative %: 70 %
Platelet Count: 114 10*3/uL — ABNORMAL LOW (ref 150–400)
RBC: 4.29 MIL/uL (ref 3.87–5.11)
RDW: 12.3 % (ref 11.5–15.5)
WBC Count: 8.5 10*3/uL (ref 4.0–10.5)
nRBC: 0 % (ref 0.0–0.2)

## 2023-01-04 LAB — FOLATE: Folate: 10.6 ng/mL (ref >5.9)

## 2023-01-04 LAB — CMP (CANCER CENTER ONLY)
ALT: 15 U/L (ref 0–44)
AST: 23 U/L (ref 15–41)
Albumin: 4.7 g/dL (ref 3.5–5.0)
Alkaline Phosphatase: 62 U/L (ref 38–126)
Anion gap: 10 (ref 5–15)
BUN: 11 mg/dL (ref 8–23)
CO2: 29 mmol/L (ref 22–32)
Calcium: 9.3 mg/dL (ref 8.9–10.3)
Chloride: 104 mmol/L (ref 98–111)
Creatinine: 0.76 mg/dL (ref 0.44–1.00)
GFR, Estimated: >60 mL/min (ref >60)
Glucose, Bld: 81 mg/dL (ref 70–99)
Potassium: 3.4 mmol/L — ABNORMAL LOW (ref 3.5–5.1)
Sodium: 143 mmol/L (ref 135–145)
Total Bilirubin: 1.5 mg/dL — ABNORMAL HIGH (ref 0.3–1.2)
Total Protein: 6.9 g/dL (ref 6.5–8.1)

## 2023-01-04 LAB — LACTATE DEHYDROGENASE: LDH: 194 U/L — ABNORMAL HIGH (ref 98–192)

## 2023-01-04 LAB — VITAMIN B12: Vitamin B-12: 237 pg/mL (ref 180–914)

## 2023-01-04 MED ORDER — DEXTROSE 5 % IV SOLN: INTRAVENOUS | Status: DC

## 2023-01-04 MED ORDER — IMMUNE GLOBULIN (HUMAN) 20 GM/200ML IV SOLN
40.0000 g | Freq: Once | INTRAVENOUS | Status: AC
  Administered 2023-01-04: 40 g via INTRAVENOUS
  Filled 2023-01-04: qty 400

## 2023-01-04 MED ORDER — DIPHENHYDRAMINE HCL 25 MG PO CAPS: 25.0000 mg | ORAL_CAPSULE | Freq: Once | ORAL | Status: DC

## 2023-01-04 MED ORDER — CYANOCOBALAMIN 1000 MCG/ML IJ SOLN
1000.0000 ug | Freq: Once | INTRAMUSCULAR | Status: AC
  Administered 2023-01-04: 1000 ug via INTRAMUSCULAR
  Filled 2023-01-04: qty 1

## 2023-01-04 MED ORDER — ACETAMINOPHEN 325 MG PO TABS: 650.0000 mg | ORAL_TABLET | Freq: Once | ORAL | Status: DC

## 2023-01-04 NOTE — Patient Instructions (Signed)

## 2023-01-04 NOTE — Progress Notes (Signed)
Hematology and Oncology Follow Up Visit  Kristy [name] [id number] Jul 10, 1955 67 y.o. 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  She is looking quite good.  She feels good.  Both she and Kristy [name] are retired.  I am so happy for them.  She has had no problems with the Imbruvica.  She is doing well with the IVIG.  She has had very little in the way of infections.  When we last saw Kristy, there is no monoclonal spike in Kristy blood.  Kristy IgM level was 54 mg/dL.Marland Kitchen  She has had no fever.  She has had no bleeding.  There is no change in bowel or bladder habits.  She has had no leg swelling.  She has had no headache.  Overall, I would say that Kristy performance status is probably ECOG 0.    Medications:  Allergies as of 01/04/2023       Reactions   Grapefruit Extract Other (See Comments)   Can not take due to Imbruvica.   Ibuprofen Other (See Comments)   Can not take due to [facility]        Medication List        Accurate as of January 04, 2023  9:51 AM. If you have any questions, ask your nurse or doctor.          amLODipine 5 MG tablet Commonly known as: NORVASC Take 5 mg by mouth daily.   Cholecalciferol 25 MCG (1000 UT) tablet Take 1,000 Units by mouth daily.   cyanocobalamin 1000 MCG/ML injection Commonly known as: VITAMIN B12 INJECT 1 ML INTO THE MUSCLE EVERY 2 MONTHS What changed: See the new instructions.   estradiol 0.1 MG/GM vaginal cream Commonly known as: ESTRACE Place 1 Applicatorful vaginally 3 (three) times a week.   Imbruvica 420 MG tablet Generic drug: ibrutinib TAKE 1 TABLET BY MOUTH DAILY AFTER BREAKFAST.   Lifitegrast 5 % Soln Place 1 drop into both eyes 2  (two) times daily.   nitrofurantoin (macrocrystal-monohydrate) 100 MG capsule Commonly known as: MACROBID Take 100 mg by mouth daily as needed (For UTI).        Allergies:  Allergies  Allergen Reactions   Grapefruit Extract Other (See Comments)    Can not take due to Imbruvica.   Ibuprofen Other (See Comments)    Can not take due to Imbruvica    Past Medical History, Surgical history, Social history, and Family History were reviewed and updated.  Review of Systems: Review of Systems  Constitutional: Negative.   HENT: Negative.    Eyes: Negative.   Respiratory: Negative.    Cardiovascular: Negative.   Gastrointestinal: Negative.   Genitourinary: Negative.   Musculoskeletal: Negative.   Skin: Negative.   Neurological: Negative.   Endo/Heme/Allergies: Negative.   Psychiatric/Behavioral:  The patient is not nervous/anxious.      Physical Exam:  height is 5' (1.524 m) and weight is 106 lb 1.9 oz (48.1 kg). Kristy oral temperature is 97.6 F (36.4 C). Kristy blood pressure is 150/64 (abnormal) and Kristy pulse is 74. Kristy respiration is 18 and oxygen saturation is 100%.   Wt Readings from Last 3 Encounters:  01/04/23 106 lb 1.9 oz (48.1 kg)  10/12/22  104 lb 1.3 oz (47.2 kg)  09/07/22 106 lb (48.1 kg)    Physical Exam Vitals reviewed.  HENT:     Head: Normocephalic and atraumatic.  Eyes:     Pupils: Pupils are equal, round, and reactive to light.  Cardiovascular:     Rate and Rhythm: Normal rate and regular rhythm.     Heart sounds: Normal heart sounds.  Pulmonary:     Effort: Pulmonary effort is normal.     Breath sounds: Normal breath sounds.  Abdominal:     General: Bowel sounds are normal.     Palpations: Abdomen is soft.  Musculoskeletal:        General: No tenderness or deformity. Normal range of motion.     Cervical back: Normal range of motion.  Lymphadenopathy:     Cervical: No cervical adenopathy.  Skin:    General: Skin is warm and dry.     Findings: No  erythema or rash.  Neurological:     Mental Status: She is alert and oriented to person, place, and time.  Psychiatric:        Behavior: Behavior normal.        Thought Content: Thought content normal.        Judgment: Judgment normal.      Lab Results  Component Value Date   WBC 8.5 01/04/2023   HGB 15.0 01/04/2023   HCT 43.9 01/04/2023   MCV 102.3 (H) 01/04/2023   PLT 114 (L) 01/04/2023   No results found for: "FERRITIN", "IRON", "TIBC", "UIBC", "IRONPCTSAT" Lab Results  Component Value Date   RETICCTPCT 2.9 (H) 10/30/2014   RBC 4.29 01/04/2023   RETICCTABS 98.3 10/30/2014   Lab Results  Component Value Date   KPAFRELGTCHN 4.9 11/23/2022   LAMBDASER 2.6 (L) 11/23/2022   KAPLAMBRATIO 1.88 (H) 11/23/2022   Lab Results  Component Value Date   IGGSERUM 682 11/23/2022   IGA 9 (L) 11/23/2022   IGMSERUM 54 11/23/2022   Lab Results  Component Value Date   TOTALPROTELP 6.0 11/23/2022   ALBUMINELP 3.8 11/23/2022   A1GS 0.2 11/23/2022   A2GS 0.5 11/23/2022   BETS 0.8 11/23/2022   BETA2SER 0.2 02/15/2015   GAMS 0.6 11/23/2022   MSPIKE Not Observed 11/23/2022   SPEI Comment 05/04/2022     Chemistry      Component Value Date/Time   NA 143 01/04/2023 0831   NA 147 (H) 03/19/2017 0849   NA 139 03/16/2016 1337   K 3.4 (L) 01/04/2023 0831   K 4.5 03/19/2017 0849   K 3.8 03/16/2016 1337   CL 104 01/04/2023 0831   CL 108 03/19/2017 0849   CO2 29 01/04/2023 0831   CO2 31 03/19/2017 0849   CO2 27 03/16/2016 1337   BUN 11 01/04/2023 0831   BUN 11 03/19/2017 0849   BUN 5.2 (L) 03/16/2016 1337   CREATININE 0.76 01/04/2023 0831   CREATININE 1.0 03/19/2017 0849   CREATININE 0.7 03/16/2016 1337      Component Value Date/Time   CALCIUM 9.3 01/04/2023 0831   CALCIUM 9.6 03/19/2017 0849   CALCIUM 8.8 03/16/2016 1337   ALKPHOS 62 01/04/2023 0831   ALKPHOS 53 03/19/2017 0849   ALKPHOS 62 03/16/2016 1337   AST 23 01/04/2023 0831   AST 24 03/16/2016 1337   ALT 15  01/04/2023 0831   ALT 23 03/19/2017 0849   ALT 19 03/16/2016 1337   BILITOT 1.5 (H) 01/04/2023 0831   BILITOT 0.57 03/16/2016 1337  Impression and Plan: Kristy [name] is a very pleasant 67 yo caucasian female with Waldenstrom's with acquired hypogammaglobulinemia and recurrent sinusitis.  Kristy Waldenstrom's continues to be under excellent control.   We will see what Kristy Waldenstrom's labs look like.  I would have believe that there can be okay.  She will get Kristy IVIG today.  She will get Kristy vitamin B12 today.  I think that the every 6-week interval is very good for Kristy.  I think this works out well.  We will get Kristy back actually after Thanksgiving now.  We will try to work the schedule so that she will also avoid the Christmas holiday.   Josph Macho, MD 10/17/20249:51 AM

## 2023-01-04 NOTE — Progress Notes (Signed)
Pt declined to stay for post infusion observation period. Pt stated she has tolerated medication multiple times prior without difficulty. Pt aware to call clinic with any questions or concerns. Pt verbalized understanding and had no further questions.  ? ?

## 2023-01-06 LAB — IGG, IGA, IGM
IgA: 12 mg/dL — ABNORMAL LOW (ref 87–352)
IgG (Immunoglobin G), Serum: 736 mg/dL (ref 586–1602)
IgM (Immunoglobulin M), Srm: 62 mg/dL (ref 26–217)

## 2023-01-09 LAB — PROTEIN ELECTROPHORESIS, SERUM
A/G Ratio: 2 — ABNORMAL HIGH (ref 0.7–1.7)
Albumin ELP: 4.3 g/dL (ref 2.9–4.4)
Alpha-1-Globulin: 0.2 g/dL (ref 0.0–0.4)
Alpha-2-Globulin: 0.5 g/dL (ref 0.4–1.0)
Beta Globulin: 0.8 g/dL (ref 0.7–1.3)
Gamma Globulin: 0.6 g/dL (ref 0.4–1.8)
Globulin, Total: 2.1 g/dL — ABNORMAL LOW (ref 2.2–3.9)
Total Protein ELP: 6.4 g/dL (ref 6.0–8.5)

## 2023-01-18 ENCOUNTER — Other Ambulatory Visit: Payer: Self-pay

## 2023-01-22 ENCOUNTER — Other Ambulatory Visit: Payer: Self-pay

## 2023-01-24 ENCOUNTER — Other Ambulatory Visit: Payer: Self-pay

## 2023-01-24 ENCOUNTER — Other Ambulatory Visit (HOSPITAL_COMMUNITY): Payer: Self-pay

## 2023-01-24 ENCOUNTER — Other Ambulatory Visit (HOSPITAL_COMMUNITY): Payer: Self-pay | Admitting: Pharmacy Technician

## 2023-01-24 NOTE — Progress Notes (Signed)
Specialty Pharmacy Refill Coordination Note  Kristy [name] is a 67 y.o. female contacted today regarding refills of specialty medication(s) Ibrutinib   Patient requested Delivery   Delivery date: 01/30/23   Verified address: [address] [place]   Medication will be filled on 01/29/23.

## 2023-01-30 ENCOUNTER — Other Ambulatory Visit (HOSPITAL_COMMUNITY): Payer: Self-pay

## 2023-02-20 ENCOUNTER — Other Ambulatory Visit: Payer: Self-pay

## 2023-02-22 ENCOUNTER — Inpatient Hospital Stay: Payer: Federal, State, Local not specified - PPO | Attending: Hematology & Oncology

## 2023-02-22 ENCOUNTER — Other Ambulatory Visit: Payer: Self-pay

## 2023-02-22 ENCOUNTER — Inpatient Hospital Stay: Payer: Federal, State, Local not specified - PPO | Admitting: Hematology & Oncology

## 2023-02-22 ENCOUNTER — Encounter: Payer: Self-pay | Admitting: Hematology & Oncology

## 2023-02-22 ENCOUNTER — Inpatient Hospital Stay: Payer: Federal, State, Local not specified - PPO

## 2023-02-22 VITALS — BP 145/81 | HR 74 | Temp 98.2°F | Resp 16 | Ht 60.0 in | Wt 106.0 lb

## 2023-02-22 VITALS — BP 111/63 | HR 75 | Resp 18

## 2023-02-22 DIAGNOSIS — J0101 Acute recurrent maxillary sinusitis: Secondary | ICD-10-CM

## 2023-02-22 DIAGNOSIS — D510 Vitamin B12 deficiency anemia due to intrinsic factor deficiency: Secondary | ICD-10-CM | POA: Diagnosis present

## 2023-02-22 DIAGNOSIS — C8800 Waldenstrom macroglobulinemia not having achieved remission: Secondary | ICD-10-CM | POA: Diagnosis not present

## 2023-02-22 DIAGNOSIS — D801 Nonfamilial hypogammaglobulinemia: Secondary | ICD-10-CM | POA: Insufficient documentation

## 2023-02-22 LAB — CBC WITH DIFFERENTIAL (CANCER CENTER ONLY)
Abs Immature Granulocytes: 0.09 10*3/uL — ABNORMAL HIGH (ref 0.00–0.07)
Basophils Absolute: 0 10*3/uL (ref 0.0–0.1)
Basophils Relative: 1 %
Eosinophils Absolute: 0.1 10*3/uL (ref 0.0–0.5)
Eosinophils Relative: 1 %
HCT: 39.9 % (ref 36.0–46.0)
Hemoglobin: 13.8 g/dL (ref 12.0–15.0)
Immature Granulocytes: 1 %
Lymphocytes Relative: 28 %
Lymphs Abs: 2.3 10*3/uL (ref 0.7–4.0)
MCH: 35.8 pg — ABNORMAL HIGH (ref 26.0–34.0)
MCHC: 34.6 g/dL (ref 30.0–36.0)
MCV: 103.4 fL — ABNORMAL HIGH (ref 80.0–100.0)
Monocytes Absolute: 0.5 10*3/uL (ref 0.1–1.0)
Monocytes Relative: 6 %
Neutro Abs: 5.2 10*3/uL (ref 1.7–7.7)
Neutrophils Relative %: 63 %
Platelet Count: 116 10*3/uL — ABNORMAL LOW (ref 150–400)
RBC: 3.86 MIL/uL — ABNORMAL LOW (ref 3.87–5.11)
RDW: 12.3 % (ref 11.5–15.5)
WBC Count: 8.2 10*3/uL (ref 4.0–10.5)
nRBC: 0 % (ref 0.0–0.2)

## 2023-02-22 LAB — CMP (CANCER CENTER ONLY)
ALT: 14 U/L (ref 0–44)
AST: 26 U/L (ref 15–41)
Albumin: 4.3 g/dL (ref 3.5–5.0)
Alkaline Phosphatase: 57 U/L (ref 38–126)
Anion gap: 8 (ref 5–15)
BUN: 9 mg/dL (ref 8–23)
CO2: 29 mmol/L (ref 22–32)
Calcium: 8.9 mg/dL (ref 8.9–10.3)
Chloride: 104 mmol/L (ref 98–111)
Creatinine: 0.78 mg/dL (ref 0.44–1.00)
GFR, Estimated: >60 mL/min (ref >60)
Glucose, Bld: 100 mg/dL — ABNORMAL HIGH (ref 70–99)
Potassium: 4 mmol/L (ref 3.5–5.1)
Sodium: 141 mmol/L (ref 135–145)
Total Bilirubin: 1.3 mg/dL — ABNORMAL HIGH (ref <1.2)
Total Protein: 6.5 g/dL (ref 6.5–8.1)

## 2023-02-22 LAB — LACTATE DEHYDROGENASE: LDH: 220 U/L — ABNORMAL HIGH (ref 98–192)

## 2023-02-22 MED ORDER — DEXTROSE 5 % IV SOLN: INTRAVENOUS | Status: DC

## 2023-02-22 MED ORDER — CYANOCOBALAMIN 1000 MCG/ML IJ SOLN
1000.0000 ug | INTRAMUSCULAR | 1 refills | Status: DC
  Filled 2023-02-22: qty 3, 90d supply, fill #0
  Filled 2023-05-10 (×2): qty 3, 90d supply, fill #1
  Filled 2023-08-31: qty 3, 90d supply, fill #2

## 2023-02-22 MED ORDER — IMMUNE GLOBULIN (HUMAN) 20 GM/200ML IV SOLN
40.0000 g | Freq: Once | INTRAVENOUS | Status: AC
  Administered 2023-02-22: 40 g via INTRAVENOUS
  Filled 2023-02-22: qty 400

## 2023-02-22 MED ORDER — DIPHENHYDRAMINE HCL 25 MG PO CAPS: 25.0000 mg | ORAL_CAPSULE | Freq: Once | ORAL | Status: DC

## 2023-02-22 MED ORDER — ACETAMINOPHEN 325 MG PO TABS: 650.0000 mg | ORAL_TABLET | Freq: Once | ORAL | Status: DC

## 2023-02-22 NOTE — Patient Instructions (Signed)

## 2023-02-22 NOTE — Progress Notes (Signed)
Hematology and Oncology Follow Up Visit  Kristy [name] [id number] [birth year] 67 y.o. 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 is here today for follow-up and treatment.  She did have a wonderful Thanksgiving.  She is looking forward to Christmas.  She will be with her family.  Otherwise, she really has had no problems.  She has had no issues with her sinuses.  Her IgG level has been doing nicely.  She like to have vitamin B12 at home now.  I do not see how this is can be a problem.  Her husband is a Engineer, civil (consulting) so he can certainly give it to her.  We will have to see how we can get this sent to her.  She has had no fever.  She has had no rashes.  There is been no bleeding.  There has been no change in bowel or bladder habits.  She continues on the Reunion.  She is doing well with the Imbruvica.  There is been no evidence of her Waldenstrom's recurring.  Overall, I would say that her performance status is ECOG 0.   Medications:  Allergies as of 02/22/2023       Reactions   Grapefruit Extract Other (See Comments)   Can not take due to Imbruvica.   Ibuprofen Other (See Comments)   Can not take due to [facility]        Medication List        Accurate as of February 22, 2023  7:57 AM. If you have any questions, ask your nurse or doctor.          amLODipine 5 MG tablet Commonly known as: NORVASC Take 5 mg by mouth daily.   Cholecalciferol 25 MCG (1000 UT) tablet Take 1,000 Units by mouth daily.   cyanocobalamin 1000 MCG/ML injection Commonly known as: VITAMIN B12 INJECT 1 ML INTO THE MUSCLE EVERY 2 MONTHS What changed: See the new instructions.   estradiol 0.1 MG/GM vaginal cream Commonly known as: ESTRACE Place 1  Applicatorful vaginally 3 (three) times a week.   Imbruvica 420 MG tablet Generic drug: ibrutinib TAKE 1 TABLET BY MOUTH DAILY AFTER BREAKFAST.   Lifitegrast 5 % Soln Place 1 drop into both eyes 2 (two) times daily.   nitrofurantoin (macrocrystal-monohydrate) 100 MG capsule Commonly known as: MACROBID Take 100 mg by mouth daily as needed (For UTI).        Allergies:  Allergies  Allergen Reactions   Grapefruit Extract Other (See Comments)    Can not take due to Imbruvica.   Ibuprofen Other (See Comments)    Can not take due to Imbruvica    Past Medical History, Surgical history, Social history, and Family History were reviewed and updated.  Review of Systems: Review of Systems  Constitutional: Negative.   HENT: Negative.    Eyes: Negative.   Respiratory: Negative.    Cardiovascular: Negative.   Gastrointestinal: Negative.   Genitourinary: Negative.   Musculoskeletal: Negative.   Skin: Negative.   Neurological: Negative.   Endo/Heme/Allergies: Negative.   Psychiatric/Behavioral:  The patient is not nervous/anxious.      Physical Exam:  Temperature 98.2.  Pulse 74.  Blood pressure 145/81.  Weight is 106 pounds.  Wt Readings from Last 3 Encounters:  01/04/23 106 lb 1.9 oz (48.1 kg)  10/12/22 104 lb 1.3 oz (47.2 kg)  09/07/22 106 lb (48.1 kg)    Physical Exam Vitals reviewed.  HENT:     Head: Normocephalic and atraumatic.  Eyes:     Pupils: Pupils are equal, round, and reactive to light.  Cardiovascular:     Rate and Rhythm: Normal rate and regular rhythm.     Heart sounds: Normal heart sounds.  Pulmonary:     Effort: Pulmonary effort is normal.     Breath sounds: Normal breath sounds.  Abdominal:     General: Bowel sounds are normal.     Palpations: Abdomen is soft.  Musculoskeletal:        General: No tenderness or deformity. Normal range of motion.     Cervical back: Normal range of motion.  Lymphadenopathy:     Cervical: No cervical adenopathy.   Skin:    General: Skin is warm and dry.     Findings: No erythema or rash.  Neurological:     Mental Status: She is alert and oriented to person, place, and time.  Psychiatric:        Behavior: Behavior normal.        Thought Content: Thought content normal.        Judgment: Judgment normal.      Lab Results  Component Value Date   WBC 8.2 02/22/2023   HGB 13.8 02/22/2023   HCT 39.9 02/22/2023   MCV 103.4 (H) 02/22/2023   PLT 116 (L) 02/22/2023   No results found for: "FERRITIN", "IRON", "TIBC", "UIBC", "IRONPCTSAT" Lab Results  Component Value Date   RETICCTPCT 2.9 (H) 10/30/2014   RBC 3.86 (L) 02/22/2023   RETICCTABS 98.3 10/30/2014   Lab Results  Component Value Date   KPAFRELGTCHN 4.9 11/23/2022   LAMBDASER 2.6 (L) 11/23/2022   KAPLAMBRATIO 1.88 (H) 11/23/2022   Lab Results  Component Value Date   IGGSERUM 736 01/04/2023   IGA 12 (L) 01/04/2023   IGMSERUM 62 01/04/2023   Lab Results  Component Value Date   TOTALPROTELP 6.4 01/04/2023   ALBUMINELP 4.3 01/04/2023   A1GS 0.2 01/04/2023   A2GS 0.5 01/04/2023   BETS 0.8 01/04/2023   BETA2SER 0.2 02/15/2015   GAMS 0.6 01/04/2023   MSPIKE Not Observed 01/04/2023   SPEI Comment 01/04/2023     Chemistry      Component Value Date/Time   NA 143 01/04/2023 0831   NA 147 (H) 03/19/2017 0849   NA 139 03/16/2016 1337   K 3.4 (L) 01/04/2023 0831   K 4.5 03/19/2017 0849   K 3.8 03/16/2016 1337   CL 104 01/04/2023 0831   CL 108 03/19/2017 0849   CO2 29 01/04/2023 0831   CO2 31 03/19/2017 0849   CO2 27 03/16/2016 1337   BUN 11 01/04/2023 0831   BUN 11 03/19/2017 0849   BUN 5.2 (L) 03/16/2016 1337   CREATININE 0.76 01/04/2023 0831   CREATININE 1.0 03/19/2017 0849   CREATININE 0.7 03/16/2016 1337      Component Value Date/Time   CALCIUM 9.3 01/04/2023 0831   CALCIUM 9.6 03/19/2017 0849   CALCIUM 8.8 03/16/2016 1337   ALKPHOS 62 01/04/2023 0831   ALKPHOS 53 03/19/2017 0849   ALKPHOS 62 03/16/2016 1337    AST 23 01/04/2023 0831   AST 24 03/16/2016 1337   ALT 15 01/04/2023 0831   ALT 23 03/19/2017 0849   ALT 19 03/16/2016 1337   BILITOT 1.5 (H) 01/04/2023  0831   BILITOT 0.57 03/16/2016 1337       Impression and Plan: Ms. Passarella is a very pleasant 67 yo caucasian female with Waldenstrom's with acquired hypogammaglobulinemia and recurrent sinusitis.  Her Waldenstrom's continues to be under excellent control.  I really do not see that this will be a problem with her.  She is doing well on the Reunion.  Again, she would like to have the vitamin B12 at home.  Will give her a dose today and then we will see how we can order this for her so she can have it at home.  We will now get her through the Christmas and New Year's holiday.  We will see her back in January.     Josph Macho, MD 12/5/20247:57 AM

## 2023-02-22 NOTE — Progress Notes (Signed)
Patient does not want to stay for the 30 minute post IVIG observation. VSS. Patient discharged ambulatory without complaints or concerns.

## 2023-02-23 ENCOUNTER — Other Ambulatory Visit: Payer: Self-pay

## 2023-02-23 LAB — IGG, IGA, IGM
IgA: 10 mg/dL — ABNORMAL LOW (ref 87–352)
IgG (Immunoglobin G), Serum: 575 mg/dL — ABNORMAL LOW (ref 586–1602)
IgM (Immunoglobulin M), Srm: 53 mg/dL (ref 26–217)

## 2023-02-23 LAB — KAPPA/LAMBDA LIGHT CHAINS
Kappa free light chain: 4.8 mg/L (ref 3.3–19.4)
Kappa, lambda light chain ratio: >3.2 — ABNORMAL HIGH (ref 0.26–1.65)
Lambda free light chains: <1.5 mg/L — ABNORMAL LOW (ref 5.7–26.3)

## 2023-02-26 ENCOUNTER — Other Ambulatory Visit (HOSPITAL_COMMUNITY): Payer: Self-pay

## 2023-02-26 ENCOUNTER — Other Ambulatory Visit: Payer: Self-pay

## 2023-02-26 LAB — PROTEIN ELECTROPHORESIS, SERUM, WITH REFLEX
A/G Ratio: 1.7 (ref 0.7–1.7)
Albumin ELP: 3.8 g/dL (ref 2.9–4.4)
Alpha-1-Globulin: 0.2 g/dL (ref 0.0–0.4)
Alpha-2-Globulin: 0.6 g/dL (ref 0.4–1.0)
Beta Globulin: 0.9 g/dL (ref 0.7–1.3)
Gamma Globulin: 0.6 g/dL (ref 0.4–1.8)
Globulin, Total: 2.3 g/dL (ref 2.2–3.9)
Total Protein ELP: 6.1 g/dL (ref 6.0–8.5)

## 2023-02-26 NOTE — Progress Notes (Signed)
Specialty Pharmacy Refill Coordination Note  Kristy [name] is a 67 y.o. female contacted today regarding refills of specialty medication(s) Ibrutinib   Patient requested Delivery   Delivery date: 02/28/23   Verified address: [address]  [place]   Medication will be filled on 02/27/23.

## 2023-02-27 ENCOUNTER — Other Ambulatory Visit: Payer: Self-pay

## 2023-03-19 ENCOUNTER — Other Ambulatory Visit: Payer: Self-pay

## 2023-03-19 ENCOUNTER — Other Ambulatory Visit (HOSPITAL_COMMUNITY): Payer: Self-pay

## 2023-03-19 NOTE — Progress Notes (Signed)
Specialty Pharmacy Refill Coordination Note  Kristy [name] is a 67 y.o. female contacted today regarding refills of specialty medication(s) Ibrutinib [name])   Patient requested Delivery   Delivery date: 03/23/23   Verified address: [address]   [place] [address]   Medication will be filled on 03/22/23.

## 2023-03-22 ENCOUNTER — Other Ambulatory Visit: Payer: Self-pay

## 2023-04-05 ENCOUNTER — Inpatient Hospital Stay: Payer: Federal, State, Local not specified - PPO

## 2023-04-05 ENCOUNTER — Inpatient Hospital Stay: Payer: Federal, State, Local not specified - PPO | Admitting: Hematology & Oncology

## 2023-04-05 ENCOUNTER — Inpatient Hospital Stay: Payer: Federal, State, Local not specified - PPO | Attending: Hematology & Oncology

## 2023-04-05 ENCOUNTER — Encounter: Payer: Self-pay | Admitting: Hematology & Oncology

## 2023-04-05 ENCOUNTER — Other Ambulatory Visit: Payer: Self-pay

## 2023-04-05 VITALS — BP 139/57 | HR 76 | Temp 97.6°F | Resp 16 | Ht 60.0 in | Wt 106.0 lb

## 2023-04-05 VITALS — BP 116/56 | HR 76 | Resp 17

## 2023-04-05 DIAGNOSIS — C8800 Waldenstrom macroglobulinemia not having achieved remission: Secondary | ICD-10-CM | POA: Diagnosis present

## 2023-04-05 DIAGNOSIS — D510 Vitamin B12 deficiency anemia due to intrinsic factor deficiency: Secondary | ICD-10-CM | POA: Diagnosis present

## 2023-04-05 DIAGNOSIS — J0101 Acute recurrent maxillary sinusitis: Secondary | ICD-10-CM

## 2023-04-05 DIAGNOSIS — D801 Nonfamilial hypogammaglobulinemia: Secondary | ICD-10-CM

## 2023-04-05 LAB — CBC WITH DIFFERENTIAL (CANCER CENTER ONLY)
Abs Immature Granulocytes: 0.07 10*3/uL (ref 0.00–0.07)
Basophils Absolute: 0 10*3/uL (ref 0.0–0.1)
Basophils Relative: 1 %
Eosinophils Absolute: 0.1 10*3/uL (ref 0.0–0.5)
Eosinophils Relative: 1 %
HCT: 39.1 % (ref 36.0–46.0)
Hemoglobin: 13.3 g/dL (ref 12.0–15.0)
Immature Granulocytes: 1 %
Lymphocytes Relative: 28 %
Lymphs Abs: 2.4 10*3/uL (ref 0.7–4.0)
MCH: 35.4 pg — ABNORMAL HIGH (ref 26.0–34.0)
MCHC: 34 g/dL (ref 30.0–36.0)
MCV: 104 fL — ABNORMAL HIGH (ref 80.0–100.0)
Monocytes Absolute: 0.6 10*3/uL (ref 0.1–1.0)
Monocytes Relative: 7 %
Neutro Abs: 5.4 10*3/uL (ref 1.7–7.7)
Neutrophils Relative %: 62 %
Platelet Count: 105 10*3/uL — ABNORMAL LOW (ref 150–400)
RBC: 3.76 MIL/uL — ABNORMAL LOW (ref 3.87–5.11)
RDW: 12.3 % (ref 11.5–15.5)
WBC Count: 8.6 10*3/uL (ref 4.0–10.5)
nRBC: 0 % (ref 0.0–0.2)

## 2023-04-05 LAB — CMP (CANCER CENTER ONLY)
ALT: 14 U/L (ref 0–44)
AST: 21 U/L (ref 15–41)
Albumin: 4.4 g/dL (ref 3.5–5.0)
Alkaline Phosphatase: 52 U/L (ref 38–126)
Anion gap: 7 (ref 5–15)
BUN: 12 mg/dL (ref 8–23)
CO2: 29 mmol/L (ref 22–32)
Calcium: 8.7 mg/dL — ABNORMAL LOW (ref 8.9–10.3)
Chloride: 104 mmol/L (ref 98–111)
Creatinine: 0.76 mg/dL (ref 0.44–1.00)
GFR, Estimated: >60 mL/min (ref >60)
Glucose, Bld: 90 mg/dL (ref 70–99)
Potassium: 4.6 mmol/L (ref 3.5–5.1)
Sodium: 140 mmol/L (ref 135–145)
Total Bilirubin: 1.3 mg/dL — ABNORMAL HIGH (ref 0.0–1.2)
Total Protein: 6.2 g/dL — ABNORMAL LOW (ref 6.5–8.1)

## 2023-04-05 MED ORDER — DIPHENHYDRAMINE HCL 25 MG PO CAPS: 25.0000 mg | ORAL_CAPSULE | Freq: Once | ORAL | Status: DC

## 2023-04-05 MED ORDER — ACETAMINOPHEN 325 MG PO TABS
650.0000 mg | ORAL_TABLET | Freq: Once | ORAL | Status: DC
Start: 2023-04-05 — End: 2023-04-05

## 2023-04-05 MED ORDER — IMMUNE GLOBULIN (HUMAN) 20 GM/200ML IV SOLN
40.0000 g | Freq: Once | INTRAVENOUS | Status: AC
  Administered 2023-04-05: 40 g via INTRAVENOUS
  Filled 2023-04-05: qty 400

## 2023-04-05 MED ORDER — DEXTROSE 5 % IV SOLN
INTRAVENOUS | Status: DC
Start: 2023-04-05 — End: 2023-04-05

## 2023-04-05 NOTE — Progress Notes (Signed)
Hematology and Oncology Follow Up Visit  Kristy [name] [id number] 14-Dec-1955 68 y.o. 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  She had a very nice Holiday season.  We saw her right for Christmas last year.  She had a wonderful Christmas and New Year's.  She has had no problems with the Imbruvica.  Extremities help with respect to the Waldenstrom's.  There is no monoclonal spike in her blood last we checked.  Her IgM level was 53 mg/dL.  She also has B12 deficiency.  Her last vitamin B12 level was 235.  Her appetite has been good.  She has no problems with nausea or vomiting.  Her mom is not doing all that well.  He shows up in [place].  Hopefully, she will not have to go up there this winter.  She has had no rashes.  There is been no change in bowel or bladder habits.  She has had no cough.  Overall, I would say that her performance status is probably ECOG 1.  .   Medications:  Allergies as of 04/05/2023       Reactions   Grapefruit Extract Other (See Comments)   Can not take due to Imbruvica.   Ibuprofen Other (See Comments)   Can not take due to [facility]        Medication List        Accurate as of April 05, 2023  8:09 AM. If you have any questions, ask your nurse or doctor.          amLODipine 5 MG tablet Commonly known as: NORVASC Take 5 mg by mouth daily.   Cholecalciferol 25 MCG (1000 UT) tablet Take 1,000 Units by mouth daily.   cyanocobalamin 1000 MCG/ML injection Commonly known as: VITAMIN B12 Inject 1 mL (1,000 mcg total) into the muscle every 30 (thirty) days.   estradiol 0.1 MG/GM vaginal cream Commonly known as: ESTRACE Place 1 Applicatorful  vaginally 3 (three) times a week.   fluconazole 100 MG tablet Commonly known as: DIFLUCAN Take 100 mg by mouth every morning.   Imbruvica 420 MG tablet Generic drug: ibrutinib TAKE 1 TABLET BY MOUTH DAILY AFTER BREAKFAST.   Lifitegrast 5 % Soln Place 1 drop into both eyes 2 (two) times daily.   nitrofurantoin (macrocrystal-monohydrate) 100 MG capsule Commonly known as: MACROBID Take 100 mg by mouth daily as needed (For UTI).        Allergies:  Allergies  Allergen Reactions   Grapefruit Extract Other (See Comments)    Can not take due to Imbruvica.   Ibuprofen Other (See Comments)    Can not take due to Imbruvica    Past Medical History, Surgical history, Social history, and Family History were reviewed and updated.  Review of Systems: Review of Systems  Constitutional: Negative.   HENT: Negative.    Eyes: Negative.   Respiratory: Negative.    Cardiovascular: Negative.   Gastrointestinal: Negative.   Genitourinary: Negative.   Musculoskeletal: Negative.   Skin: Negative.   Neurological: Negative.   Endo/Heme/Allergies: Negative.   Psychiatric/Behavioral:  The patient is not nervous/anxious.      Physical Exam:  Temperature 97.6.  Pulse 76.  Blood pressure 139/57.  Weight  is 106 pounds.    Wt Readings from Last 3 Encounters:  02/22/23 106 lb (48.1 kg)  01/04/23 106 lb 1.9 oz (48.1 kg)  10/12/22 104 lb 1.3 oz (47.2 kg)    Physical Exam Vitals reviewed.  HENT:     Head: Normocephalic and atraumatic.  Eyes:     Pupils: Pupils are equal, round, and reactive to light.  Cardiovascular:     Rate and Rhythm: Normal rate and regular rhythm.     Heart sounds: Normal heart sounds.  Pulmonary:     Effort: Pulmonary effort is normal.     Breath sounds: Normal breath sounds.  Abdominal:     General: Bowel sounds are normal.     Palpations: Abdomen is soft.  Musculoskeletal:        General: No tenderness or deformity. Normal range of motion.     Cervical  back: Normal range of motion.  Lymphadenopathy:     Cervical: No cervical adenopathy.  Skin:    General: Skin is warm and dry.     Findings: No erythema or rash.  Neurological:     Mental Status: She is alert and oriented to person, place, and time.  Psychiatric:        Behavior: Behavior normal.        Thought Content: Thought content normal.        Judgment: Judgment normal.      Lab Results  Component Value Date   WBC 8.6 04/05/2023   HGB 13.3 04/05/2023   HCT 39.1 04/05/2023   MCV 104.0 (H) 04/05/2023   PLT 105 (L) 04/05/2023   No results found for: "FERRITIN", "IRON", "TIBC", "UIBC", "IRONPCTSAT" Lab Results  Component Value Date   RETICCTPCT 2.9 (H) 10/30/2014   RBC 3.76 (L) 04/05/2023   RETICCTABS 98.3 10/30/2014   Lab Results  Component Value Date   KPAFRELGTCHN 4.8 02/22/2023   LAMBDASER <1.5 (L) 02/22/2023   KAPLAMBRATIO >3.20 (H) 02/22/2023   Lab Results  Component Value Date   IGGSERUM 575 (L) 02/22/2023   IGA 10 (L) 02/22/2023   IGMSERUM 53 02/22/2023   Lab Results  Component Value Date   TOTALPROTELP 6.1 02/22/2023   ALBUMINELP 3.8 02/22/2023   A1GS 0.2 02/22/2023   A2GS 0.6 02/22/2023   BETS 0.9 02/22/2023   BETA2SER 0.2 02/15/2015   GAMS 0.6 02/22/2023   MSPIKE Not Observed 02/22/2023   SPEI Comment 01/04/2023     Chemistry      Component Value Date/Time   NA 141 02/22/2023 0732   NA 147 (H) 03/19/2017 0849   NA 139 03/16/2016 1337   K 4.0 02/22/2023 0732   K 4.5 03/19/2017 0849   K 3.8 03/16/2016 1337   CL 104 02/22/2023 0732   CL 108 03/19/2017 0849   CO2 29 02/22/2023 0732   CO2 31 03/19/2017 0849   CO2 27 03/16/2016 1337   BUN 9 02/22/2023 0732   BUN 11 03/19/2017 0849   BUN 5.2 (L) 03/16/2016 1337   CREATININE 0.78 02/22/2023 0732   CREATININE 1.0 03/19/2017 0849   CREATININE 0.7 03/16/2016 1337      Component Value Date/Time   CALCIUM 8.9 02/22/2023 0732   CALCIUM 9.6 03/19/2017 0849   CALCIUM 8.8 03/16/2016 1337    ALKPHOS 57 02/22/2023 0732   ALKPHOS 53 03/19/2017 0849   ALKPHOS 62 03/16/2016 1337   AST 26 02/22/2023 0732   AST 24 03/16/2016 1337   ALT 14 02/22/2023 0732   ALT 23 03/19/2017  0849   ALT 19 03/16/2016 1337   BILITOT 1.3 (H) 02/22/2023 0732   BILITOT 0.57 03/16/2016 1337       Impression and Plan: Kristy [name] is a very pleasant 68 yo caucasian female with Waldenstrom's with acquired hypogammaglobulinemia and recurrent sinusitis.  Her Waldenstrom's continues to be under excellent control.  I really do not see that this will be a problem with her.  She is doing well on the Reunion.  At some point, I may think about think about stopping the Imbruvica.  I think she is doing her vitamin B12 at home.  We will plan to get her back in 6 more weeks.  At that point, we should be getting close to Spring.     Josph Macho, MD 1/16/20258:09 AM

## 2023-04-05 NOTE — Progress Notes (Signed)
Pt declined to stay for post infusion observation period. Pt stated she has tolerated medication multiple times prior without difficulty. Pt aware to call clinic with any questions or concerns. Pt verbalized understanding and had no further questions.  ? ?

## 2023-04-05 NOTE — Patient Instructions (Signed)

## 2023-04-06 LAB — KAPPA/LAMBDA LIGHT CHAINS
Kappa free light chain: 4.1 mg/L (ref 3.3–19.4)
Kappa, lambda light chain ratio: 1.95 — ABNORMAL HIGH (ref 0.26–1.65)
Lambda free light chains: 2.1 mg/L — ABNORMAL LOW (ref 5.7–26.3)

## 2023-04-07 LAB — IGG, IGA, IGM
IgA: 5 mg/dL — ABNORMAL LOW (ref 87–352)
IgG (Immunoglobin G), Serum: 624 mg/dL (ref 586–1602)
IgM (Immunoglobulin M), Srm: 47 mg/dL (ref 26–217)

## 2023-04-10 ENCOUNTER — Other Ambulatory Visit: Payer: Self-pay

## 2023-04-11 ENCOUNTER — Other Ambulatory Visit: Payer: Self-pay

## 2023-04-11 LAB — PROTEIN ELECTROPHORESIS, SERUM, WITH REFLEX
A/G Ratio: 2.1 — ABNORMAL HIGH (ref 0.7–1.7)
Albumin ELP: 4.1 g/dL (ref 2.9–4.4)
Alpha-1-Globulin: 0.2 g/dL (ref 0.0–0.4)
Alpha-2-Globulin: 0.5 g/dL (ref 0.4–1.0)
Beta Globulin: 0.8 g/dL (ref 0.7–1.3)
Gamma Globulin: 0.5 g/dL (ref 0.4–1.8)
Globulin, Total: 2 g/dL — ABNORMAL LOW (ref 2.2–3.9)
Total Protein ELP: 6.1 g/dL (ref 6.0–8.5)

## 2023-04-11 NOTE — Progress Notes (Signed)
Specialty Pharmacy Refill Coordination Note  Kristy [name] is a 68 y.o. female contacted today regarding refills of specialty medication(s) Ibrutinib [name])   Patient requested Delivery   Delivery date: 04/19/23   Verified address: [address]  [place] [address]   Medication will be filled on 04/18/23.

## 2023-04-12 ENCOUNTER — Other Ambulatory Visit: Payer: Self-pay

## 2023-04-13 ENCOUNTER — Other Ambulatory Visit: Payer: Self-pay

## 2023-04-16 ENCOUNTER — Other Ambulatory Visit: Payer: Self-pay

## 2023-04-18 ENCOUNTER — Other Ambulatory Visit: Payer: Self-pay

## 2023-05-10 ENCOUNTER — Other Ambulatory Visit: Payer: Self-pay

## 2023-05-10 ENCOUNTER — Other Ambulatory Visit (HOSPITAL_COMMUNITY): Payer: Self-pay

## 2023-05-10 NOTE — Progress Notes (Signed)
 Specialty Pharmacy Ongoing Clinical Assessment Note  Kristy [name] is a 68 y.o. female who is being followed by the specialty pharmacy service for RxSp Oncology   Patient's specialty medication(s) reviewed today: Ibrutinib (IMBRUVICA)   Missed doses in the last 4 weeks: 0   Patient/Caregiver did not have any additional questions or concerns.   Therapeutic benefit summary: Patient is achieving benefit   Adverse events/side effects summary: No adverse events/side effects   Patient's therapy is appropriate to: Continue    Goals Addressed             This Visit's Progress    Stabilization of disease       Patient is on track. Patient will maintain adherence         Follow up:  6 months  Bobette Mo Specialty Pharmacist

## 2023-05-10 NOTE — Progress Notes (Signed)
 Specialty Pharmacy Refill Coordination Note  Kristy [name] is a 68 y.o. female contacted today regarding refills of specialty medication(s) Ibrutinib [name])   Patient requested Delivery   Delivery date: 05/17/23   Verified address: [address]  [place] [address]   Medication will be filled on 05/16/23.

## 2023-05-17 ENCOUNTER — Other Ambulatory Visit: Payer: Self-pay

## 2023-05-17 ENCOUNTER — Inpatient Hospital Stay: Payer: Federal, State, Local not specified - PPO | Admitting: Hematology & Oncology

## 2023-05-17 ENCOUNTER — Inpatient Hospital Stay: Payer: Federal, State, Local not specified - PPO

## 2023-05-23 ENCOUNTER — Inpatient Hospital Stay: Payer: Federal, State, Local not specified - PPO

## 2023-05-23 ENCOUNTER — Encounter: Payer: Self-pay | Admitting: Family

## 2023-05-23 ENCOUNTER — Inpatient Hospital Stay: Payer: Federal, State, Local not specified - PPO | Attending: Hematology & Oncology

## 2023-05-23 ENCOUNTER — Inpatient Hospital Stay (HOSPITAL_BASED_OUTPATIENT_CLINIC_OR_DEPARTMENT_OTHER): Payer: Federal, State, Local not specified - PPO | Admitting: Family

## 2023-05-23 VITALS — BP 125/63 | HR 68 | Resp 17

## 2023-05-23 VITALS — BP 127/65 | HR 83 | Temp 97.7°F | Resp 18 | Wt 103.4 lb

## 2023-05-23 DIAGNOSIS — D510 Vitamin B12 deficiency anemia due to intrinsic factor deficiency: Secondary | ICD-10-CM | POA: Insufficient documentation

## 2023-05-23 DIAGNOSIS — C8800 Waldenstrom macroglobulinemia not having achieved remission: Secondary | ICD-10-CM | POA: Insufficient documentation

## 2023-05-23 DIAGNOSIS — D801 Nonfamilial hypogammaglobulinemia: Secondary | ICD-10-CM

## 2023-05-23 DIAGNOSIS — J0101 Acute recurrent maxillary sinusitis: Secondary | ICD-10-CM

## 2023-05-23 LAB — CBC WITH DIFFERENTIAL (CANCER CENTER ONLY)
Abs Immature Granulocytes: 0.08 10*3/uL — ABNORMAL HIGH (ref 0.00–0.07)
Basophils Absolute: 0 10*3/uL (ref 0.0–0.1)
Basophils Relative: 0 %
Eosinophils Absolute: 0.1 10*3/uL (ref 0.0–0.5)
Eosinophils Relative: 1 %
HCT: 41.3 % (ref 36.0–46.0)
Hemoglobin: 14.1 g/dL (ref 12.0–15.0)
Immature Granulocytes: 1 %
Lymphocytes Relative: 23 %
Lymphs Abs: 2.1 10*3/uL (ref 0.7–4.0)
MCH: 35.4 pg — ABNORMAL HIGH (ref 26.0–34.0)
MCHC: 34.1 g/dL (ref 30.0–36.0)
MCV: 103.8 fL — ABNORMAL HIGH (ref 80.0–100.0)
Monocytes Absolute: 0.7 10*3/uL (ref 0.1–1.0)
Monocytes Relative: 8 %
Neutro Abs: 6.1 10*3/uL (ref 1.7–7.7)
Neutrophils Relative %: 67 %
Platelet Count: 120 10*3/uL — ABNORMAL LOW (ref 150–400)
RBC: 3.98 MIL/uL (ref 3.87–5.11)
RDW: 12.2 % (ref 11.5–15.5)
WBC Count: 9 10*3/uL (ref 4.0–10.5)
nRBC: 0 % (ref 0.0–0.2)

## 2023-05-23 LAB — CMP (CANCER CENTER ONLY)
ALT: 10 U/L (ref 0–44)
AST: 19 U/L (ref 15–41)
Albumin: 4.6 g/dL (ref 3.5–5.0)
Alkaline Phosphatase: 50 U/L (ref 38–126)
Anion gap: 8 (ref 5–15)
BUN: 10 mg/dL (ref 8–23)
CO2: 29 mmol/L (ref 22–32)
Calcium: 9.1 mg/dL (ref 8.9–10.3)
Chloride: 104 mmol/L (ref 98–111)
Creatinine: 0.78 mg/dL (ref 0.44–1.00)
GFR, Estimated: >60 mL/min (ref >60)
Glucose, Bld: 89 mg/dL (ref 70–99)
Potassium: 4.1 mmol/L (ref 3.5–5.1)
Sodium: 141 mmol/L (ref 135–145)
Total Bilirubin: 1.3 mg/dL — ABNORMAL HIGH (ref 0.0–1.2)
Total Protein: 6.5 g/dL (ref 6.5–8.1)

## 2023-05-23 LAB — VITAMIN B12: Vitamin B-12: 488 pg/mL (ref 180–914)

## 2023-05-23 LAB — LACTATE DEHYDROGENASE: LDH: 196 U/L — ABNORMAL HIGH (ref 98–192)

## 2023-05-23 MED ORDER — DEXTROSE 5 % IV SOLN: INTRAVENOUS | Status: DC

## 2023-05-23 MED ORDER — ACETAMINOPHEN 325 MG PO TABS: 650.0000 mg | ORAL_TABLET | Freq: Once | ORAL | Status: DC

## 2023-05-23 MED ORDER — DIPHENHYDRAMINE HCL 25 MG PO CAPS: 25.0000 mg | ORAL_CAPSULE | Freq: Once | ORAL | Status: DC

## 2023-05-23 MED ORDER — IMMUNE GLOBULIN (HUMAN) 20 GM/200ML IV SOLN
40.0000 g | Freq: Once | INTRAVENOUS | Status: AC
Start: 2023-05-23 — End: 2023-05-23
  Administered 2023-05-23: 40 g via INTRAVENOUS
  Filled 2023-05-23: qty 400

## 2023-05-23 NOTE — Patient Instructions (Signed)
 Immune Globulin Injection What is this medication? IMMUNE GLOBULIN (im MUNE GLOB yoo lin) treats many immune system conditions. It works by Designer, multimedia extra antibodies. Antibodies are proteins made by the immune system that help protect the body. This medicine may be used for other purposes; ask your health care provider or pharmacist if you have questions. COMMON BRAND NAME(S): ASCENIV, Baygam, BIVIGAM, Carimune, Carimune NF, cutaquig, Cuvitru, Flebogamma, Flebogamma DIF, GamaSTAN, GamaSTAN S/D, Gamimune N, Gammagard, Gammagard S/D, Gammaked, Gammaplex, Gammar-P IV, Gamunex, Gamunex-C, Hizentra, Iveegam, Iveegam EN, Octagam, Panglobulin, Panglobulin NF, panzyga, Polygam S/D, Privigen, Sandoglobulin, Venoglobulin-S, Vigam, Vivaglobulin, Xembify What should I tell my care team before I take this medication? They need to know if you have any of these conditions: Blood clotting disorder Condition where you have excess fluid in your body, such as heart failure or edema Dehydration Diabetes Have had blood clots Heart disease Immune system conditions Kidney disease Low levels of IgA Recent or upcoming vaccine An unusual or allergic reaction to immune globulin, other medications, foods, dyes, or preservatives Pregnant or trying to get pregnant Breastfeeding How should I use this medication? This medication is infused into a vein or under the skin. It is usually given by your care team in a hospital or clinic setting. It may also be given at home. If you get this medication at home, you will be taught how to prepare and give it. Use exactly as directed. Take it as directed on the prescription label at the same time every day. Keep taking it unless your care team tells you to stop. It is important that you put your used needles and syringes in a special sharps container. Do not put them in a trash can. If you do not have a sharps container, call your pharmacist or care team to get one. Talk to  your care team about the use of this medication in children. While it may be given to children for selected conditions, precautions do apply. Overdosage: If you think you have taken too much of this medicine contact a poison control center or emergency room at once. NOTE: This medicine is only for you. Do not share this medicine with others. What if I miss a dose? If you get this medication at the hospital or clinic: It is important not to miss your dose. Call your care team if you are unable to keep an appointment. If you give yourself this medication at home: If you miss a dose, take it as soon as you can. Then continue your normal schedule. If it is almost time for your next dose, take only that dose. Do not take double or extra doses. Call your care team with questions. What may interact with this medication? Live virus vaccines This list may not describe all possible interactions. Give your health care provider a list of all the medicines, herbs, non-prescription drugs, or dietary supplements you use. Also tell them if you smoke, drink alcohol, or use illegal drugs. Some items may interact with your medicine. What should I watch for while using this medication? Your condition will be monitored carefully while you are receiving this medication. Tell your care team if your symptoms do not start to get better or if they get worse. You may need blood work done while you are taking this medication. This medication increases the risk of blood clots. People with heart, blood vessel, or blood clotting conditions are more likely to develop a blood clot. Other risk factors include advanced age, estrogen  use, tobacco use, lack of movement, and being overweight. This medication can decrease the response to a vaccine. If you need to get vaccinated, tell your care team if you have received this medication within the last year. Extra booster doses may be needed. Talk to your care team to see if a different  vaccination schedule is needed. If you have diabetes, you may get a falsely elevated blood sugar reading. Talk to your care team about how to check your blood sugar while taking this medication. What side effects may I notice from receiving this medication? Side effects that you should report to your care team as soon as possible: Allergic reactions--skin rash, itching, hives, swelling of the face, lips, tongue, or throat Blood clot--pain, swelling, or warmth in the leg, shortness of breath, chest pain Fever, neck pain or stiffness, sensitivity to light, headache, nausea, vomiting, confusion, which may be signs of meningitis Hemolytic anemia--unusual weakness or fatigue, dizziness, headache, trouble breathing, dark urine, yellowing skin or eyes Kidney injury--decrease in the amount of urine, swelling of the ankles, hands, or feet Low sodium level--muscle weakness, fatigue, dizziness, headache, confusion Shortness of breath or trouble breathing, cough, unusual weakness or fatigue, blue skin or lips Side effects that usually do not require medical attention (report these to your care team if they continue or are bothersome): Chills Diarrhea Fever Headache Nausea This list may not describe all possible side effects. Call your doctor for medical advice about side effects. You may report side effects to FDA at 1-800-FDA-1088. Where should I keep my medication? Keep out of the reach of children and pets. You will be instructed on how to store this medication. Get rid of any unused medication after the expiration date. To get rid of medications that are no longer needed or have expired: Take the medication to a medication take-back program. Check with your pharmacy or law enforcement to find a location. If you cannot return the medication, ask your pharmacist or care team how to get rid of this medication safely. NOTE: This sheet is a summary. It may not cover all possible information. If you have  questions about this medicine, talk to your doctor, pharmacist, or health care provider.  2024 Elsevier/Gold Standard (2023-02-16 00:00:00)

## 2023-05-23 NOTE — Progress Notes (Signed)
 Hematology and Oncology Follow Up Visit  Kristy [name] [id number] 21-Jan-1956 68 y.o. 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 She is doing well and has no complaints at this time.  She had some sinus congestion and drainage recently but this seems to be mostly resolved.  No fever, chills, n/v, cough, rash, dizziness, SOB, chest pain, palpitations, abdominal pain or changes in bowel or bladder habits.  No swelling in her extremities.  She has some tingling in once the toes on her right foot where she had the toenail removed. She states that this is healing nicely.  No falls or syncope reported.  No bruising or petechiae. No abnormal blood loss.  Appetite and hydration are good. Weight is stable at 103 lbs.   ECOG Performance Status: 0 - Asymptomatic  Medications:  Allergies as of 05/23/2023       Reactions   Grapefruit Extract Other (See Comments)   Can not take due to Imbruvica.   Ibuprofen Other (See Comments)   Can not take due to [facility]        Medication List        Accurate as of May 23, 2023  8:52 AM. If you have any questions, ask your nurse or doctor.          amLODipine 5 MG tablet Commonly known as: NORVASC Take 5 mg by mouth daily.   Cholecalciferol 25 MCG (1000 UT) tablet Take 1,000 Units by mouth daily.   cyanocobalamin 1000 MCG/ML injection Commonly known as: VITAMIN B12 Inject 1 mL (1,000 mcg total) into the muscle every 30 (thirty) days.   estradiol 0.1 MG/GM vaginal cream Commonly known as: ESTRACE Place 1 Applicatorful vaginally 3 (three) times a week.   fluconazole 100 MG tablet Commonly known as: DIFLUCAN Take 100 mg by  mouth every morning.   Imbruvica 420 MG tablet Generic drug: ibrutinib TAKE 1 TABLET BY MOUTH DAILY AFTER BREAKFAST.   Lifitegrast 5 % Soln Place 1 drop into both eyes 2 (two) times daily.   nitrofurantoin (macrocrystal-monohydrate) 100 MG capsule Commonly known as: MACROBID Take 100 mg by mouth daily as needed (For UTI).        Allergies:  Allergies  Allergen Reactions   Grapefruit Extract Other (See Comments)    Can not take due to Imbruvica.   Ibuprofen Other (See Comments)    Can not take due to Imbruvica    Past Medical History, Surgical history, Social history, and Family History were reviewed and updated.  Review of Systems: All other 10 point review of systems is negative.   Physical Exam:  weight is 103 lb 6.4 oz (46.9 kg). Her oral temperature is 97.7 F (36.5 C). Her blood pressure is 127/65 and her pulse is 83. Her respiration is 18 and oxygen saturation is 100%.   Wt Readings from Last 3 Encounters:  05/23/23 103 lb 6.4 oz (46.9 kg)  04/05/23 106 lb (48.1 kg)  02/22/23 106 lb (48.1 kg)    Ocular: Sclerae unicteric, pupils equal, round and reactive to light Ear-nose-throat: Oropharynx clear, dentition fair Lymphatic: No cervical or supraclavicular  adenopathy Lungs no rales or rhonchi, good excursion bilaterally Heart regular rate and rhythm, no murmur appreciated Abd soft, nontender, positive bowel sounds MSK no focal spinal tenderness, no joint edema Neuro: non-focal, well-oriented, appropriate affect Breasts: Deferred   Lab Results  Component Value Date   WBC 9.0 05/23/2023   HGB 14.1 05/23/2023   HCT 41.3 05/23/2023   MCV 103.8 (H) 05/23/2023   PLT 120 (L) 05/23/2023   No results found for: "FERRITIN", "IRON", "TIBC", "UIBC", "IRONPCTSAT" Lab Results  Component Value Date   RETICCTPCT 2.9 (H) 10/30/2014   RBC 3.98 05/23/2023   RETICCTABS 98.3 10/30/2014   Lab Results  Component Value Date   KPAFRELGTCHN 4.1 04/05/2023   LAMBDASER 2.1  (L) 04/05/2023   KAPLAMBRATIO 1.95 (H) 04/05/2023   Lab Results  Component Value Date   IGGSERUM 624 04/05/2023   IGA 5 (L) 04/05/2023   IGMSERUM 47 04/05/2023   Lab Results  Component Value Date   TOTALPROTELP 6.1 04/05/2023   ALBUMINELP 4.1 04/05/2023   A1GS 0.2 04/05/2023   A2GS 0.5 04/05/2023   BETS 0.8 04/05/2023   BETA2SER 0.2 02/15/2015   GAMS 0.5 04/05/2023   MSPIKE Not Observed 04/05/2023   SPEI Comment 01/04/2023     Chemistry      Component Value Date/Time   NA 140 04/05/2023 0751   NA 147 (H) 03/19/2017 0849   NA 139 03/16/2016 1337   K 4.6 04/05/2023 0751   K 4.5 03/19/2017 0849   K 3.8 03/16/2016 1337   CL 104 04/05/2023 0751   CL 108 03/19/2017 0849   CO2 29 04/05/2023 0751   CO2 31 03/19/2017 0849   CO2 27 03/16/2016 1337   BUN 12 04/05/2023 0751   BUN 11 03/19/2017 0849   BUN 5.2 (L) 03/16/2016 1337   CREATININE 0.76 04/05/2023 0751   CREATININE 1.0 03/19/2017 0849   CREATININE 0.7 03/16/2016 1337      Component Value Date/Time   CALCIUM 8.7 (L) 04/05/2023 0751   CALCIUM 9.6 03/19/2017 0849   CALCIUM 8.8 03/16/2016 1337   ALKPHOS 52 04/05/2023 0751   ALKPHOS 53 03/19/2017 0849   ALKPHOS 62 03/16/2016 1337   AST 21 04/05/2023 0751   AST 24 03/16/2016 1337   ALT 14 04/05/2023 0751   ALT 23 03/19/2017 0849   ALT 19 03/16/2016 1337   BILITOT 1.3 (H) 04/05/2023 0751   BILITOT 0.57 03/16/2016 1337       Impression and Plan: Kristy [name] is a very pleasant 68 yo caucasian female with Waldenstrom's with acquired hypogammaglobulinemia and recurrent sinusitis.  We will proceed with IVIG infusion today as planned. This has really improved her quality of life and reduced the frequency of her sinusitis infections.  She is doing quite well! Follow-up in 6 weeks.   Eileen Stanford, NP 3/5/20258:52 AM

## 2023-05-24 LAB — IGG, IGA, IGM
IgA: 11 mg/dL — ABNORMAL LOW (ref 87–352)
IgG (Immunoglobin G), Serum: 575 mg/dL — ABNORMAL LOW (ref 586–1602)
IgM (Immunoglobulin M), Srm: 54 mg/dL (ref 26–217)

## 2023-05-24 LAB — KAPPA/LAMBDA LIGHT CHAINS
Kappa free light chain: 4 mg/L (ref 3.3–19.4)
Kappa, lambda light chain ratio: 1.82 — ABNORMAL HIGH (ref 0.26–1.65)
Lambda free light chains: 2.2 mg/L — ABNORMAL LOW (ref 5.7–26.3)

## 2023-06-01 ENCOUNTER — Other Ambulatory Visit (HOSPITAL_COMMUNITY): Payer: Self-pay

## 2023-06-01 ENCOUNTER — Other Ambulatory Visit: Payer: Self-pay | Admitting: Hematology & Oncology

## 2023-06-01 ENCOUNTER — Other Ambulatory Visit: Payer: Self-pay

## 2023-06-01 MED ORDER — IBRUTINIB 420 MG PO TABS
ORAL_TABLET | ORAL | 12 refills | Status: AC
  Filled 2023-06-01: qty 28, 28d supply, fill #0
  Filled 2023-07-05: qty 28, 28d supply, fill #1
  Filled 2023-08-07: qty 28, 28d supply, fill #2
  Filled 2023-08-31: qty 28, 28d supply, fill #3
  Filled 2023-10-03: qty 28, 28d supply, fill #4
  Filled 2023-10-29: qty 28, 28d supply, fill #5
  Filled 2023-11-27: qty 28, 28d supply, fill #6
  Filled 2023-12-26: qty 28, 28d supply, fill #7
  Filled 2024-01-17: qty 28, 28d supply, fill #8
  Filled 2024-02-19: qty 28, 28d supply, fill #9
  Filled 2024-03-19: qty 28, 28d supply, fill #10
  Filled 2024-04-09: qty 28, 28d supply, fill #11

## 2023-06-01 NOTE — Progress Notes (Signed)
 Specialty Pharmacy Refill Coordination Note  Kristy [name] is a 68 y.o. female contacted today regarding refills of specialty medication(s) Ibrutinib [name])   Patient requested Delivery   Delivery date: 06/11/23   Verified address: [address]  [place] [address]   Medication will be filled on 06/08/23.

## 2023-06-04 ENCOUNTER — Other Ambulatory Visit (HOSPITAL_COMMUNITY): Payer: Self-pay

## 2023-06-08 ENCOUNTER — Other Ambulatory Visit: Payer: Self-pay

## 2023-06-11 ENCOUNTER — Other Ambulatory Visit (HOSPITAL_COMMUNITY): Payer: Self-pay

## 2023-06-11 ENCOUNTER — Other Ambulatory Visit: Payer: Self-pay

## 2023-07-05 ENCOUNTER — Inpatient Hospital Stay: Admitting: Hematology & Oncology

## 2023-07-05 ENCOUNTER — Inpatient Hospital Stay: Attending: Hematology & Oncology

## 2023-07-05 ENCOUNTER — Inpatient Hospital Stay

## 2023-07-05 ENCOUNTER — Other Ambulatory Visit: Payer: Self-pay

## 2023-07-05 ENCOUNTER — Encounter: Payer: Self-pay | Admitting: Hematology & Oncology

## 2023-07-05 VITALS — BP 128/62 | HR 79 | Resp 19

## 2023-07-05 VITALS — BP 136/67 | HR 83 | Temp 97.8°F | Resp 18 | Ht 60.0 in | Wt 103.1 lb

## 2023-07-05 DIAGNOSIS — D801 Nonfamilial hypogammaglobulinemia: Secondary | ICD-10-CM | POA: Diagnosis present

## 2023-07-05 DIAGNOSIS — C8800 Waldenstrom macroglobulinemia not having achieved remission: Secondary | ICD-10-CM | POA: Insufficient documentation

## 2023-07-05 DIAGNOSIS — D510 Vitamin B12 deficiency anemia due to intrinsic factor deficiency: Secondary | ICD-10-CM | POA: Insufficient documentation

## 2023-07-05 DIAGNOSIS — J0101 Acute recurrent maxillary sinusitis: Secondary | ICD-10-CM

## 2023-07-05 LAB — CBC WITH DIFFERENTIAL (CANCER CENTER ONLY)
Abs Immature Granulocytes: 0.06 10*3/uL (ref 0.00–0.07)
Basophils Absolute: 0 10*3/uL (ref 0.0–0.1)
Basophils Relative: 0 %
Eosinophils Absolute: 0.1 10*3/uL (ref 0.0–0.5)
Eosinophils Relative: 1 %
HCT: 40.8 % (ref 36.0–46.0)
Hemoglobin: 14 g/dL (ref 12.0–15.0)
Immature Granulocytes: 1 %
Lymphocytes Relative: 25 %
Lymphs Abs: 2.1 10*3/uL (ref 0.7–4.0)
MCH: 35.8 pg — ABNORMAL HIGH (ref 26.0–34.0)
MCHC: 34.3 g/dL (ref 30.0–36.0)
MCV: 104.3 fL — ABNORMAL HIGH (ref 80.0–100.0)
Monocytes Absolute: 0.6 10*3/uL (ref 0.1–1.0)
Monocytes Relative: 7 %
Neutro Abs: 5.5 10*3/uL (ref 1.7–7.7)
Neutrophils Relative %: 66 %
Platelet Count: 109 10*3/uL — ABNORMAL LOW (ref 150–400)
RBC: 3.91 MIL/uL (ref 3.87–5.11)
RDW: 12.7 % (ref 11.5–15.5)
WBC Count: 8.3 10*3/uL (ref 4.0–10.5)
nRBC: 0 % (ref 0.0–0.2)

## 2023-07-05 LAB — CMP (CANCER CENTER ONLY)
ALT: 20 U/L (ref 0–44)
AST: 25 U/L (ref 15–41)
Albumin: 4.3 g/dL (ref 3.5–5.0)
Alkaline Phosphatase: 60 U/L (ref 38–126)
Anion gap: 7 (ref 5–15)
BUN: 12 mg/dL (ref 8–23)
CO2: 30 mmol/L (ref 22–32)
Calcium: 9.2 mg/dL (ref 8.9–10.3)
Chloride: 101 mmol/L (ref 98–111)
Creatinine: 0.81 mg/dL (ref 0.44–1.00)
GFR, Estimated: >60 mL/min (ref >60)
Glucose, Bld: 114 mg/dL — ABNORMAL HIGH (ref 70–99)
Potassium: 4.5 mmol/L (ref 3.5–5.1)
Sodium: 138 mmol/L (ref 135–145)
Total Bilirubin: 1.3 mg/dL — ABNORMAL HIGH (ref 0.0–1.2)
Total Protein: 6.1 g/dL — ABNORMAL LOW (ref 6.5–8.1)

## 2023-07-05 LAB — LACTATE DEHYDROGENASE: LDH: 191 U/L (ref 98–192)

## 2023-07-05 LAB — VITAMIN B12: Vitamin B-12: 253 pg/mL (ref 180–914)

## 2023-07-05 MED ORDER — DIPHENHYDRAMINE HCL 25 MG PO CAPS: 25.0000 mg | ORAL_CAPSULE | Freq: Once | ORAL | Status: DC

## 2023-07-05 MED ORDER — AMOXICILLIN 500 MG PO CAPS
2000.0000 mg | ORAL_CAPSULE | Freq: Once | ORAL | 3 refills | Status: AC
Start: 2023-07-05 — End: 2023-07-05

## 2023-07-05 MED ORDER — DEXTROSE 5 % IV SOLN: INTRAVENOUS | Status: DC

## 2023-07-05 MED ORDER — IMMUNE GLOBULIN (HUMAN) 20 GM/200ML IV SOLN
40.0000 g | Freq: Once | INTRAVENOUS | Status: AC
  Administered 2023-07-05: 40 g via INTRAVENOUS
  Filled 2023-07-05: qty 400

## 2023-07-05 MED ORDER — ACETAMINOPHEN 325 MG PO TABS: 650.0000 mg | ORAL_TABLET | Freq: Once | ORAL | Status: DC

## 2023-07-05 NOTE — Progress Notes (Signed)
 Specialty Pharmacy Refill Coordination Note  Kristy [name] is a 68 y.o. female contacted today regarding refills of specialty medication(s) Ibrutinib (IMBRUVICA)   Patient requested Delivery   Delivery date: 07/16/23   Verified address: [address]  [place] [address]   Medication will be filled on 07/13/23.

## 2023-07-05 NOTE — Progress Notes (Signed)
 Hematology and Oncology Follow Up Visit  Kristy [name] [id number] May 25, 1955 68 y.o. 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  Overall, she is doing quite nicely.  We last saw her back in March.  She is get ready for a nice Easter holiday.  Currently, her house is a little bit disheveled because they are putting in new floors.  Hopefully, this will be able to get done in the next couple weeks.  She has had some issues with her sinuses secondary to the pollen.  Outside of that, she has been doing quite nicely..  She has had no problems with rashes.  She has had no change in bowel or bladder habits.  She has had no nausea or vomiting.  Has been no leg swelling.  She has had no bleeding.  Her last monoclonal spike was not noted in her blood.  Will recheck the IgM level it was only 54 mg/dL.  Her last vitamin B12 level was 488 pg/mL  Of note, it is found that she cannot have dental work done next week.  I think it be prudent to put her on some prophylactic amoxicillin (2 g 1 hour prior to procedure).  Currently, I would have to say that her performance status is probably ECOG 1.     Medications:  Allergies as of 07/05/2023       Reactions   Grapefruit Extract Other (See Comments)   Can not take due to Imbruvica.   Ibuprofen Other (See Comments)   Can not take due to [facility]        Medication List        Accurate as of July 05, 2023  8:47 AM. If you have any questions, ask your nurse or doctor.          STOP taking these medications    fluconazole 100 MG tablet Commonly known as: DIFLUCAN Stopped by: Josph Macho       TAKE these medications    amLODipine 5 MG  tablet Commonly known as: NORVASC Take 5 mg by mouth daily.   Cholecalciferol 25 MCG (1000 UT) tablet Take 1,000 Units by mouth daily.   cyanocobalamin 1000 MCG/ML injection Commonly known as: VITAMIN B12 Inject 1 mL (1,000 mcg total) into the muscle every 30 (thirty) days.   estradiol 0.1 MG/GM vaginal cream Commonly known as: ESTRACE Place 1 Applicatorful vaginally 3 (three) times a week.   Imbruvica 420 MG tablet Generic drug: ibrutinib TAKE 1 TABLET BY MOUTH DAILY AFTER BREAKFAST.   Lifitegrast 5 % Soln Place 1 drop into both eyes 2 (two) times daily.   nitrofurantoin (macrocrystal-monohydrate) 100 MG capsule Commonly known as: MACROBID Take 100 mg by mouth daily as needed (For UTI).        Allergies:  Allergies  Allergen Reactions   Grapefruit Extract Other (See Comments)    Can not take due to Imbruvica.   Ibuprofen Other (See Comments)    Can not take due to Imbruvica    Past Medical History, Surgical history, Social history, and Family History were reviewed and updated.  Review of Systems: Review of Systems  Constitutional: Negative.   HENT: Negative.    Eyes: Negative.   Respiratory:  Negative.    Cardiovascular: Negative.   Gastrointestinal: Negative.   Genitourinary: Negative.   Musculoskeletal: Negative.   Skin: Negative.   Neurological: Negative.   Endo/Heme/Allergies: Negative.   Psychiatric/Behavioral:  The patient is not nervous/anxious.      Physical Exam:  Temperature 97.8.  Pulse 83.  Blood pressure 136/67.  Weight is 103 pounds.    Wt Readings from Last 3 Encounters:  07/05/23 103 lb 1.9 oz (46.8 kg)  05/23/23 103 lb 6.4 oz (46.9 kg)  04/05/23 106 lb (48.1 kg)    Physical Exam Vitals reviewed.  HENT:     Head: Normocephalic and atraumatic.  Eyes:     Pupils: Pupils are equal, round, and reactive to light.  Cardiovascular:     Rate and Rhythm: Normal rate and regular rhythm.     Heart sounds: Normal heart sounds.   Pulmonary:     Effort: Pulmonary effort is normal.     Breath sounds: Normal breath sounds.  Abdominal:     General: Bowel sounds are normal.     Palpations: Abdomen is soft.  Musculoskeletal:        General: No tenderness or deformity. Normal range of motion.     Cervical back: Normal range of motion.  Lymphadenopathy:     Cervical: No cervical adenopathy.  Skin:    General: Skin is warm and dry.     Findings: No erythema or rash.  Neurological:     Mental Status: She is alert and oriented to person, place, and time.  Psychiatric:        Behavior: Behavior normal.        Thought Content: Thought content normal.        Judgment: Judgment normal.      Lab Results  Component Value Date   WBC 8.3 07/05/2023   HGB 14.0 07/05/2023   HCT 40.8 07/05/2023   MCV 104.3 (H) 07/05/2023   PLT 109 (L) 07/05/2023   No results found for: "FERRITIN", "IRON", "TIBC", "UIBC", "IRONPCTSAT" Lab Results  Component Value Date   RETICCTPCT 2.9 (H) 10/30/2014   RBC 3.91 07/05/2023   RETICCTABS 98.3 10/30/2014   Lab Results  Component Value Date   KPAFRELGTCHN 4.0 05/23/2023   LAMBDASER 2.2 (L) 05/23/2023   KAPLAMBRATIO 1.82 (H) 05/23/2023   Lab Results  Component Value Date   IGGSERUM 575 (L) 05/23/2023   IGA 11 (L) 05/23/2023   IGMSERUM 54 05/23/2023   Lab Results  Component Value Date   TOTALPROTELP 6.1 04/05/2023   ALBUMINELP 4.1 04/05/2023   A1GS 0.2 04/05/2023   A2GS 0.5 04/05/2023   BETS 0.8 04/05/2023   BETA2SER 0.2 02/15/2015   GAMS 0.5 04/05/2023   MSPIKE Not Observed 04/05/2023   SPEI Comment 01/04/2023     Chemistry      Component Value Date/Time   NA 141 05/23/2023 0815   NA 147 (H) 03/19/2017 0849   NA 139 03/16/2016 1337   K 4.1 05/23/2023 0815   K 4.5 03/19/2017 0849   K 3.8 03/16/2016 1337   CL 104 05/23/2023 0815   CL 108 03/19/2017 0849   CO2 29 05/23/2023 0815   CO2 31 03/19/2017 0849   CO2 27 03/16/2016 1337   BUN 10 05/23/2023 0815   BUN 11  03/19/2017 0849   BUN 5.2 (L) 03/16/2016 1337   CREATININE 0.78 05/23/2023 0815   CREATININE 1.0 03/19/2017 0849   CREATININE 0.7 03/16/2016 1337      Component Value Date/Time  CALCIUM 9.1 05/23/2023 0815   CALCIUM 9.6 03/19/2017 0849   CALCIUM 8.8 03/16/2016 1337   ALKPHOS 50 05/23/2023 0815   ALKPHOS 53 03/19/2017 0849   ALKPHOS 62 03/16/2016 1337   AST 19 05/23/2023 0815   AST 24 03/16/2016 1337   ALT 10 05/23/2023 0815   ALT 23 03/19/2017 0849   ALT 19 03/16/2016 1337   BILITOT 1.3 (H) 05/23/2023 0815   BILITOT 0.57 03/16/2016 1337       Impression and Plan: Kristy [name] is a very pleasant 68 yo caucasian female with Waldenstrom's with acquired hypogammaglobulinemia and recurrent sinusitis.  Her Waldenstrom's continues to be under excellent control.  I really do not see that this will be a problem with her.  She is doing well on the Imbruvica.   I think she is doing her vitamin B12 at home.  We will plan to get her back in 6 more weeks.    We will try to get her through [place] day.      Ivor Mars, MD 4/17/20258:47 AM

## 2023-07-05 NOTE — Patient Instructions (Signed)
 Immune Globulin Injection What is this medication? IMMUNE GLOBULIN (im MUNE GLOB yoo lin) treats many immune system conditions. It works by Designer, multimedia extra antibodies. Antibodies are proteins made by the immune system that help protect the body. This medicine may be used for other purposes; ask your health care provider or pharmacist if you have questions. COMMON BRAND NAME(S): ASCENIV, Baygam, BIVIGAM, Carimune, Carimune NF, cutaquig, Cuvitru, Flebogamma, Flebogamma DIF, GamaSTAN, GamaSTAN S/D, Gamimune N, Gammagard, Gammagard S/D, Gammaked, Gammaplex, Gammar-P IV, Gamunex, Gamunex-C, Hizentra, Iveegam, Iveegam EN, Octagam, Panglobulin, Panglobulin NF, panzyga, Polygam S/D, Privigen, Sandoglobulin, Venoglobulin-S, Vigam, Vivaglobulin, Xembify What should I tell my care team before I take this medication? They need to know if you have any of these conditions: Blood clotting disorder Condition where you have excess fluid in your body, such as heart failure or edema Dehydration Diabetes Have had blood clots Heart disease Immune system conditions Kidney disease Low levels of IgA Recent or upcoming vaccine An unusual or allergic reaction to immune globulin, other medications, foods, dyes, or preservatives Pregnant or trying to get pregnant Breastfeeding How should I use this medication? This medication is infused into a vein or under the skin. It is usually given by your care team in a hospital or clinic setting. It may also be given at home. If you get this medication at home, you will be taught how to prepare and give it. Use exactly as directed. Take it as directed on the prescription label at the same time every day. Keep taking it unless your care team tells you to stop. It is important that you put your used needles and syringes in a special sharps container. Do not put them in a trash can. If you do not have a sharps container, call your pharmacist or care team to get one. Talk to  your care team about the use of this medication in children. While it may be given to children for selected conditions, precautions do apply. Overdosage: If you think you have taken too much of this medicine contact a poison control center or emergency room at once. NOTE: This medicine is only for you. Do not share this medicine with others. What if I miss a dose? If you get this medication at the hospital or clinic: It is important not to miss your dose. Call your care team if you are unable to keep an appointment. If you give yourself this medication at home: If you miss a dose, take it as soon as you can. Then continue your normal schedule. If it is almost time for your next dose, take only that dose. Do not take double or extra doses. Call your care team with questions. What may interact with this medication? Live virus vaccines This list may not describe all possible interactions. Give your health care provider a list of all the medicines, herbs, non-prescription drugs, or dietary supplements you use. Also tell them if you smoke, drink alcohol, or use illegal drugs. Some items may interact with your medicine. What should I watch for while using this medication? Your condition will be monitored carefully while you are receiving this medication. Tell your care team if your symptoms do not start to get better or if they get worse. You may need blood work done while you are taking this medication. This medication increases the risk of blood clots. People with heart, blood vessel, or blood clotting conditions are more likely to develop a blood clot. Other risk factors include advanced age, estrogen  use, tobacco use, lack of movement, and being overweight. This medication can decrease the response to a vaccine. If you need to get vaccinated, tell your care team if you have received this medication within the last year. Extra booster doses may be needed. Talk to your care team to see if a different  vaccination schedule is needed. If you have diabetes, you may get a falsely elevated blood sugar reading. Talk to your care team about how to check your blood sugar while taking this medication. What side effects may I notice from receiving this medication? Side effects that you should report to your care team as soon as possible: Allergic reactions--skin rash, itching, hives, swelling of the face, lips, tongue, or throat Blood clot--pain, swelling, or warmth in the leg, shortness of breath, chest pain Fever, neck pain or stiffness, sensitivity to light, headache, nausea, vomiting, confusion, which may be signs of meningitis Hemolytic anemia--unusual weakness or fatigue, dizziness, headache, trouble breathing, dark urine, yellowing skin or eyes Kidney injury--decrease in the amount of urine, swelling of the ankles, hands, or feet Low sodium level--muscle weakness, fatigue, dizziness, headache, confusion Shortness of breath or trouble breathing, cough, unusual weakness or fatigue, blue skin or lips Side effects that usually do not require medical attention (report these to your care team if they continue or are bothersome): Chills Diarrhea Fever Headache Nausea This list may not describe all possible side effects. Call your doctor for medical advice about side effects. You may report side effects to FDA at 1-800-FDA-1088. Where should I keep my medication? Keep out of the reach of children and pets. You will be instructed on how to store this medication. Get rid of any unused medication after the expiration date. To get rid of medications that are no longer needed or have expired: Take the medication to a medication take-back program. Check with your pharmacy or law enforcement to find a location. If you cannot return the medication, ask your pharmacist or care team how to get rid of this medication safely. NOTE: This sheet is a summary. It may not cover all possible information. If you have  questions about this medicine, talk to your doctor, pharmacist, or health care provider.  2024 Elsevier/Gold Standard (2023-02-16 00:00:00)

## 2023-07-06 LAB — IGG, IGA, IGM
IgA: 9 mg/dL — ABNORMAL LOW (ref 87–352)
IgG (Immunoglobin G), Serum: 637 mg/dL (ref 586–1602)
IgM (Immunoglobulin M), Srm: 51 mg/dL (ref 26–217)

## 2023-07-09 LAB — KAPPA/LAMBDA LIGHT CHAINS
Kappa free light chain: 4.3 mg/L (ref 3.3–19.4)
Kappa, lambda light chain ratio: 0.8 (ref 0.26–1.65)
Lambda free light chains: 5.4 mg/L — ABNORMAL LOW (ref 5.7–26.3)

## 2023-07-12 ENCOUNTER — Other Ambulatory Visit: Payer: Self-pay

## 2023-07-13 ENCOUNTER — Other Ambulatory Visit: Payer: Self-pay

## 2023-07-20 ENCOUNTER — Other Ambulatory Visit: Payer: Self-pay | Admitting: *Deleted

## 2023-07-20 DIAGNOSIS — D510 Vitamin B12 deficiency anemia due to intrinsic factor deficiency: Secondary | ICD-10-CM

## 2023-07-20 MED ORDER — BD ECLIPSE SYRINGE/NEEDLE 23G X 1" 3 ML MISC: 1000.0000 ug | 1 refills | Status: AC

## 2023-07-24 ENCOUNTER — Other Ambulatory Visit: Payer: Self-pay

## 2023-08-06 ENCOUNTER — Other Ambulatory Visit: Payer: Self-pay

## 2023-08-07 ENCOUNTER — Other Ambulatory Visit: Payer: Self-pay

## 2023-08-08 ENCOUNTER — Other Ambulatory Visit: Payer: Self-pay

## 2023-08-09 ENCOUNTER — Other Ambulatory Visit (HOSPITAL_COMMUNITY): Payer: Self-pay

## 2023-08-09 NOTE — Progress Notes (Signed)
 Specialty Pharmacy Refill Coordination Note  Kristy [name] is a 68 y.o. female contacted today regarding refills of specialty medication(s) Ibrutinib  (IMBRUVICA )   Patient requested Delivery   Delivery date: 08/15/23   Verified address: [address]  [place] [address]   Medication will be filled on 08/14/23.

## 2023-08-14 ENCOUNTER — Other Ambulatory Visit: Payer: Self-pay

## 2023-08-16 ENCOUNTER — Inpatient Hospital Stay: Admitting: Hematology & Oncology

## 2023-08-16 ENCOUNTER — Encounter: Payer: Self-pay | Admitting: Hematology & Oncology

## 2023-08-16 ENCOUNTER — Other Ambulatory Visit: Payer: Self-pay

## 2023-08-16 ENCOUNTER — Inpatient Hospital Stay

## 2023-08-16 ENCOUNTER — Inpatient Hospital Stay: Attending: Hematology & Oncology

## 2023-08-16 VITALS — BP 108/57 | HR 67 | Temp 97.7°F | Resp 16

## 2023-08-16 VITALS — BP 136/67 | HR 75 | Temp 97.6°F | Resp 16 | Ht 60.0 in | Wt 103.0 lb

## 2023-08-16 DIAGNOSIS — D801 Nonfamilial hypogammaglobulinemia: Secondary | ICD-10-CM | POA: Insufficient documentation

## 2023-08-16 DIAGNOSIS — D510 Vitamin B12 deficiency anemia due to intrinsic factor deficiency: Secondary | ICD-10-CM | POA: Diagnosis present

## 2023-08-16 DIAGNOSIS — C8800 Waldenstrom macroglobulinemia not having achieved remission: Secondary | ICD-10-CM

## 2023-08-16 DIAGNOSIS — J0101 Acute recurrent maxillary sinusitis: Secondary | ICD-10-CM

## 2023-08-16 LAB — CBC WITH DIFFERENTIAL (CANCER CENTER ONLY)
Abs Immature Granulocytes: 0.11 10*3/uL — ABNORMAL HIGH (ref 0.00–0.07)
Basophils Absolute: 0 10*3/uL (ref 0.0–0.1)
Basophils Relative: 0 %
Eosinophils Absolute: 0.1 10*3/uL (ref 0.0–0.5)
Eosinophils Relative: 1 %
HCT: 43.5 % (ref 36.0–46.0)
Hemoglobin: 14.8 g/dL (ref 12.0–15.0)
Immature Granulocytes: 1 %
Lymphocytes Relative: 26 %
Lymphs Abs: 2.5 10*3/uL (ref 0.7–4.0)
MCH: 35.3 pg — ABNORMAL HIGH (ref 26.0–34.0)
MCHC: 34 g/dL (ref 30.0–36.0)
MCV: 103.8 fL — ABNORMAL HIGH (ref 80.0–100.0)
Monocytes Absolute: 0.6 10*3/uL (ref 0.1–1.0)
Monocytes Relative: 6 %
Neutro Abs: 6.1 10*3/uL (ref 1.7–7.7)
Neutrophils Relative %: 66 %
Platelet Count: 101 10*3/uL — ABNORMAL LOW (ref 150–400)
RBC: 4.19 MIL/uL (ref 3.87–5.11)
RDW: 12.4 % (ref 11.5–15.5)
WBC Count: 9.4 10*3/uL (ref 4.0–10.5)
nRBC: 0 % (ref 0.0–0.2)

## 2023-08-16 LAB — CMP (CANCER CENTER ONLY)
ALT: 11 U/L (ref 0–44)
AST: 23 U/L (ref 15–41)
Albumin: 4.7 g/dL (ref 3.5–5.0)
Alkaline Phosphatase: 52 U/L (ref 38–126)
Anion gap: 9 (ref 5–15)
BUN: 13 mg/dL (ref 8–23)
CO2: 29 mmol/L (ref 22–32)
Calcium: 9 mg/dL (ref 8.9–10.3)
Chloride: 102 mmol/L (ref 98–111)
Creatinine: 0.75 mg/dL (ref 0.44–1.00)
GFR, Estimated: >60 mL/min (ref >60)
Glucose, Bld: 86 mg/dL (ref 70–99)
Potassium: 3.7 mmol/L (ref 3.5–5.1)
Sodium: 140 mmol/L (ref 135–145)
Total Bilirubin: 1.3 mg/dL — ABNORMAL HIGH (ref 0.0–1.2)
Total Protein: 6.1 g/dL — ABNORMAL LOW (ref 6.5–8.1)

## 2023-08-16 LAB — LACTATE DEHYDROGENASE: LDH: 200 U/L — ABNORMAL HIGH (ref 98–192)

## 2023-08-16 LAB — VITAMIN B12: Vitamin B-12: 214 pg/mL (ref 180–914)

## 2023-08-16 MED ORDER — IMMUNE GLOBULIN (HUMAN) 20 GM/200ML IV SOLN
40.0000 g | Freq: Once | INTRAVENOUS | Status: AC
  Administered 2023-08-16: 40 g via INTRAVENOUS
  Filled 2023-08-16: qty 400

## 2023-08-16 MED ORDER — ACETAMINOPHEN 325 MG PO TABS
650.0000 mg | ORAL_TABLET | Freq: Once | ORAL | Status: DC
Start: 2023-08-16 — End: 2023-08-16

## 2023-08-16 MED ORDER — SODIUM CHLORIDE 0.9 % IV SOLN: INTRAVENOUS | Status: DC

## 2023-08-16 MED ORDER — DEXTROSE 5 % IV SOLN: INTRAVENOUS | Status: DC

## 2023-08-16 MED ORDER — DIPHENHYDRAMINE HCL 25 MG PO CAPS
25.0000 mg | ORAL_CAPSULE | Freq: Once | ORAL | Status: DC
Start: 2023-08-16 — End: 2023-08-16

## 2023-08-16 NOTE — Progress Notes (Signed)
 Hematology and Oncology Follow Up Visit  Kristy [name] [id number] [birth year] 68 y.o. 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 is here today for follow-up and treatment.  The floors in her house are still being done.  It sounds like this will take another month or so.  It is somewhat stressful for her.  Otherwise, she seems to be doing pretty well.  She did have some PFTs done.  Apparently, she has some restrictive changes.  Pulmonary Medicine is going to evaluate this.  She is really not symptomatic with any lung findings.  She does not have any chest wall pain.  There is no cough.  She is having no wheezing.  There is no fever.  When we last saw her, there was no monoclonal spike in her blood.  The IgM level was 51 mg/dL.  She had normal kappa and light chains.  She has had no problem with bleeding.  There is no change in bowel or bladder habits.  Her appetite has been pretty good.  There is been no leg swelling.  Her last vitamin B12 level was 253 pg/mL.  Overall, I will say performance status is probably ECOG 1.   Medications:  Allergies as of 08/16/2023       Reactions   Grapefruit Extract Other (See Comments)   Can not take due to Imbruvica .   Ibuprofen Other (See Comments)   Can not take due to Imbruvica         Medication List        Accurate as of Aug 16, 2023  8:41 AM. If you have any questions, ask your nurse or doctor.          amLODipine  5 MG tablet Commonly known as: NORVASC  Take 5 mg by mouth daily.   BD Eclipse Syringe/Needle 23G X 1" 3 ML Misc Generic drug: SYRINGE-NEEDLE (DISP) 3 ML Inject 1,000 mcg into the muscle every 30 (thirty) days.   Cholecalciferol 25 MCG (1000 UT) tablet Take 1,000 Units  by mouth daily.   cyanocobalamin  1000 MCG/ML injection Commonly known as: VITAMIN B12 Inject 1 mL (1,000 mcg total) into the muscle every 30 (thirty) days.   estradiol  0.1 MG/GM vaginal cream Commonly known as: ESTRACE  Place 1 Applicatorful vaginally 3 (three) times a week.   Imbruvica  420 MG tablet Generic drug: ibrutinib  TAKE 1 TABLET BY MOUTH DAILY AFTER BREAKFAST.   Lifitegrast  5 % Soln Place 1 drop into both eyes 2 (two) times daily.   nitrofurantoin (macrocrystal-monohydrate) 100 MG capsule Commonly known as: MACROBID Take 100 mg by mouth daily as needed (For UTI).        Allergies:  Allergies  Allergen Reactions   Grapefruit Extract Other (See Comments)    Can not take due to Imbruvica .   Ibuprofen Other (See Comments)    Can not take due to Imbruvica     Past Medical History, Surgical history, Social history, and Family History were reviewed and updated.  Review of Systems: Review of Systems  Constitutional: Negative.   HENT: Negative.    Eyes: Negative.   Respiratory: Negative.    Cardiovascular: Negative.   Gastrointestinal: Negative.   Genitourinary: Negative.   Musculoskeletal: Negative.   Skin: Negative.   Neurological: Negative.   Endo/Heme/Allergies: Negative.  Psychiatric/Behavioral:  The patient is not nervous/anxious.      Physical Exam:  Temperature 97.6.  Pulse 75.  Blood pressure 136/67.  Weight is 103 pounds.   Wt Readings from Last 3 Encounters:  07/05/23 103 lb 1.9 oz (46.8 kg)  05/23/23 103 lb 6.4 oz (46.9 kg)  04/05/23 106 lb (48.1 kg)    Physical Exam Vitals reviewed.  HENT:     Head: Normocephalic and atraumatic.  Eyes:     Pupils: Pupils are equal, round, and reactive to light.  Cardiovascular:     Rate and Rhythm: Normal rate and regular rhythm.     Heart sounds: Normal heart sounds.  Pulmonary:     Effort: Pulmonary effort is normal.     Breath sounds: Normal breath sounds.  Abdominal:     General: Bowel sounds  are normal.     Palpations: Abdomen is soft.  Musculoskeletal:        General: No tenderness or deformity. Normal range of motion.     Cervical back: Normal range of motion.  Lymphadenopathy:     Cervical: No cervical adenopathy.  Skin:    General: Skin is warm and dry.     Findings: No erythema or rash.  Neurological:     Mental Status: She is alert and oriented to person, place, and time.  Psychiatric:        Behavior: Behavior normal.        Thought Content: Thought content normal.        Judgment: Judgment normal.      Lab Results  Component Value Date   WBC 9.4 08/16/2023   HGB 14.8 08/16/2023   HCT 43.5 08/16/2023   MCV 103.8 (H) 08/16/2023   PLT 101 (L) 08/16/2023   No results found for: "FERRITIN", "IRON", "TIBC", "UIBC", "IRONPCTSAT" Lab Results  Component Value Date   RETICCTPCT 2.9 (H) 10/30/2014   RBC 4.19 08/16/2023   RETICCTABS 98.3 10/30/2014   Lab Results  Component Value Date   KPAFRELGTCHN 4.3 07/05/2023   LAMBDASER 5.4 (L) 07/05/2023   KAPLAMBRATIO 0.80 07/05/2023   Lab Results  Component Value Date   IGGSERUM 637 07/05/2023   IGA 9 (L) 07/05/2023   IGMSERUM 51 07/05/2023   Lab Results  Component Value Date   TOTALPROTELP 6.1 04/05/2023   ALBUMINELP 4.1 04/05/2023   A1GS 0.2 04/05/2023   A2GS 0.5 04/05/2023   BETS 0.8 04/05/2023   BETA2SER 0.2 02/15/2015   GAMS 0.5 04/05/2023   MSPIKE Not Observed 04/05/2023   SPEI Comment 01/04/2023     Chemistry      Component Value Date/Time   NA 138 07/05/2023 0819   NA 147 (H) 03/19/2017 0849   NA 139 03/16/2016 1337   K 4.5 07/05/2023 0819   K 4.5 03/19/2017 0849   K 3.8 03/16/2016 1337   CL 101 07/05/2023 0819   CL 108 03/19/2017 0849   CO2 30 07/05/2023 0819   CO2 31 03/19/2017 0849   CO2 27 03/16/2016 1337   BUN 12 07/05/2023 0819   BUN 11 03/19/2017 0849   BUN 5.2 (L) 03/16/2016 1337   CREATININE 0.81 07/05/2023 0819   CREATININE 1.0 03/19/2017 0849   CREATININE 0.7 03/16/2016  1337      Component Value Date/Time   CALCIUM 9.2 07/05/2023 0819   CALCIUM 9.6 03/19/2017 0849   CALCIUM 8.8 03/16/2016 1337   ALKPHOS 60 07/05/2023 0819   ALKPHOS 53 03/19/2017 0849   ALKPHOS 62 03/16/2016 1337  AST 25 07/05/2023 0819   AST 24 03/16/2016 1337   ALT 20 07/05/2023 0819   ALT 23 03/19/2017 0849   ALT 19 03/16/2016 1337   BILITOT 1.3 (H) 07/05/2023 0819   BILITOT 0.57 03/16/2016 1337       Impression and Plan: Ms. Dow is a very pleasant 68 yo caucasian female with Waldenstrom's with acquired hypogammaglobulinemia and recurrent sinusitis.  Her Waldenstrom's continues to be under excellent control.  I really do not see that this will be a problem with her.  She is doing well on the Imbruvica .   I think she is doing her vitamin B12 at home.  Hopefully, the floors will get done within the next month or so.  I know this will make her very happy..  I think we can probably get her back after the July 4 holiday.    Ivor Mars, MD 5/29/20258:41 AM

## 2023-08-16 NOTE — Patient Instructions (Signed)
 Immune Globulin Injection What is this medication? IMMUNE GLOBULIN (im MUNE GLOB yoo lin) treats many immune system conditions. It works by Designer, multimedia extra antibodies. Antibodies are proteins made by the immune system that help protect the body. This medicine may be used for other purposes; ask your health care provider or pharmacist if you have questions. COMMON BRAND NAME(S): ASCENIV, Baygam, BIVIGAM, Carimune, Carimune NF, cutaquig, Cuvitru, Flebogamma, Flebogamma DIF, GamaSTAN, GamaSTAN S/D, Gamimune N, Gammagard, Gammagard S/D, Gammaked, Gammaplex, Gammar-P IV, Gamunex, Gamunex-C, Hizentra, Iveegam, Iveegam EN, Octagam, Panglobulin, Panglobulin NF, panzyga, Polygam S/D, Privigen, Sandoglobulin, Venoglobulin-S, Vigam, Vivaglobulin, Xembify What should I tell my care team before I take this medication? They need to know if you have any of these conditions: Blood clotting disorder Condition where you have excess fluid in your body, such as heart failure or edema Dehydration Diabetes Have had blood clots Heart disease Immune system conditions Kidney disease Low levels of IgA Recent or upcoming vaccine An unusual or allergic reaction to immune globulin, other medications, foods, dyes, or preservatives Pregnant or trying to get pregnant Breastfeeding How should I use this medication? This medication is infused into a vein or under the skin. It is usually given by your care team in a hospital or clinic setting. It may also be given at home. If you get this medication at home, you will be taught how to prepare and give it. Use exactly as directed. Take it as directed on the prescription label at the same time every day. Keep taking it unless your care team tells you to stop. It is important that you put your used needles and syringes in a special sharps container. Do not put them in a trash can. If you do not have a sharps container, call your pharmacist or care team to get one. Talk to  your care team about the use of this medication in children. While it may be given to children for selected conditions, precautions do apply. Overdosage: If you think you have taken too much of this medicine contact a poison control center or emergency room at once. NOTE: This medicine is only for you. Do not share this medicine with others. What if I miss a dose? If you get this medication at the hospital or clinic: It is important not to miss your dose. Call your care team if you are unable to keep an appointment. If you give yourself this medication at home: If you miss a dose, take it as soon as you can. Then continue your normal schedule. If it is almost time for your next dose, take only that dose. Do not take double or extra doses. Call your care team with questions. What may interact with this medication? Live virus vaccines This list may not describe all possible interactions. Give your health care provider a list of all the medicines, herbs, non-prescription drugs, or dietary supplements you use. Also tell them if you smoke, drink alcohol, or use illegal drugs. Some items may interact with your medicine. What should I watch for while using this medication? Your condition will be monitored carefully while you are receiving this medication. Tell your care team if your symptoms do not start to get better or if they get worse. You may need blood work done while you are taking this medication. This medication increases the risk of blood clots. People with heart, blood vessel, or blood clotting conditions are more likely to develop a blood clot. Other risk factors include advanced age, estrogen  use, tobacco use, lack of movement, and being overweight. This medication can decrease the response to a vaccine. If you need to get vaccinated, tell your care team if you have received this medication within the last year. Extra booster doses may be needed. Talk to your care team to see if a different  vaccination schedule is needed. If you have diabetes, you may get a falsely elevated blood sugar reading. Talk to your care team about how to check your blood sugar while taking this medication. What side effects may I notice from receiving this medication? Side effects that you should report to your care team as soon as possible: Allergic reactions--skin rash, itching, hives, swelling of the face, lips, tongue, or throat Blood clot--pain, swelling, or warmth in the leg, shortness of breath, chest pain Fever, neck pain or stiffness, sensitivity to light, headache, nausea, vomiting, confusion, which may be signs of meningitis Hemolytic anemia--unusual weakness or fatigue, dizziness, headache, trouble breathing, dark urine, yellowing skin or eyes Kidney injury--decrease in the amount of urine, swelling of the ankles, hands, or feet Low sodium level--muscle weakness, fatigue, dizziness, headache, confusion Shortness of breath or trouble breathing, cough, unusual weakness or fatigue, blue skin or lips Side effects that usually do not require medical attention (report these to your care team if they continue or are bothersome): Chills Diarrhea Fever Headache Nausea This list may not describe all possible side effects. Call your doctor for medical advice about side effects. You may report side effects to FDA at 1-800-FDA-1088. Where should I keep my medication? Keep out of the reach of children and pets. You will be instructed on how to store this medication. Get rid of any unused medication after the expiration date. To get rid of medications that are no longer needed or have expired: Take the medication to a medication take-back program. Check with your pharmacy or law enforcement to find a location. If you cannot return the medication, ask your pharmacist or care team how to get rid of this medication safely. NOTE: This sheet is a summary. It may not cover all possible information. If you have  questions about this medicine, talk to your doctor, pharmacist, or health care provider.  2024 Elsevier/Gold Standard (2023-02-16 00:00:00)

## 2023-08-17 LAB — IGG, IGA, IGM
IgA: 10 mg/dL — ABNORMAL LOW (ref 87–352)
IgG (Immunoglobin G), Serum: 677 mg/dL (ref 586–1602)
IgM (Immunoglobulin M), Srm: 52 mg/dL (ref 26–217)

## 2023-08-17 LAB — KAPPA/LAMBDA LIGHT CHAINS
Kappa free light chain: 4.5 mg/L (ref 3.3–19.4)
Kappa, lambda light chain ratio: 1.73 — ABNORMAL HIGH (ref 0.26–1.65)
Lambda free light chains: 2.6 mg/L — ABNORMAL LOW (ref 5.7–26.3)

## 2023-08-17 LAB — PROTEIN ELECTROPHORESIS, SERUM, WITH REFLEX
A/G Ratio: 1.5 (ref 0.7–1.7)
Albumin ELP: 3.7 g/dL (ref 2.9–4.4)
Alpha-1-Globulin: 0.2 g/dL (ref 0.0–0.4)
Alpha-2-Globulin: 0.6 g/dL (ref 0.4–1.0)
Beta Globulin: 1 g/dL (ref 0.7–1.3)
Gamma Globulin: 0.7 g/dL (ref 0.4–1.8)
Globulin, Total: 2.5 g/dL (ref 2.2–3.9)
Total Protein ELP: 6.2 g/dL (ref 6.0–8.5)

## 2023-08-30 ENCOUNTER — Other Ambulatory Visit: Payer: Self-pay

## 2023-08-31 ENCOUNTER — Other Ambulatory Visit (HOSPITAL_COMMUNITY): Payer: Self-pay

## 2023-08-31 ENCOUNTER — Other Ambulatory Visit: Payer: Self-pay | Admitting: Pharmacy Technician

## 2023-08-31 ENCOUNTER — Other Ambulatory Visit: Payer: Self-pay

## 2023-08-31 NOTE — Progress Notes (Signed)
 Specialty Pharmacy Refill Coordination Note  Kristy [name] is a 68 y.o. female contacted today regarding refills of specialty medication(s) Ibrutinib  (IMBRUVICA )   Patient requested Delivery   Delivery date: 09/12/23   Verified address: [address]   [place] [address]   Medication will be filled on 09/11/23.

## 2023-09-06 ENCOUNTER — Other Ambulatory Visit: Payer: Self-pay

## 2023-09-06 NOTE — Progress Notes (Signed)
 Clinical Intervention Note  Clinical Intervention Notes: Patient reports initiating Flonase . No DDIs identified with Imbruvica .   Clinical Intervention Outcomes: Prevention of an adverse drug event   Rena Carnes Specialty Pharmacist

## 2023-09-07 ENCOUNTER — Other Ambulatory Visit: Payer: Self-pay

## 2023-09-08 ENCOUNTER — Other Ambulatory Visit (HOSPITAL_COMMUNITY): Payer: Self-pay

## 2023-10-02 ENCOUNTER — Other Ambulatory Visit (HOSPITAL_COMMUNITY): Payer: Self-pay

## 2023-10-03 ENCOUNTER — Other Ambulatory Visit: Payer: Self-pay

## 2023-10-03 NOTE — Progress Notes (Signed)
 Specialty Pharmacy Refill Coordination Note  Kristy [name] is a 68 y.o. female contacted today regarding refills of specialty medication(s) Ibrutinib  (IMBRUVICA )   Patient requested Delivery   Delivery date: 10/05/23   Verified address: [address]   [place] [address]   Medication will be filled on 10/04/23.

## 2023-10-04 ENCOUNTER — Inpatient Hospital Stay: Admitting: Medical Oncology

## 2023-10-04 ENCOUNTER — Inpatient Hospital Stay: Attending: Hematology & Oncology

## 2023-10-04 ENCOUNTER — Inpatient Hospital Stay

## 2023-10-04 VITALS — BP 108/63 | HR 72 | Temp 98.0°F | Resp 16

## 2023-10-04 DIAGNOSIS — C8800 Waldenstrom macroglobulinemia not having achieved remission: Secondary | ICD-10-CM | POA: Diagnosis not present

## 2023-10-04 DIAGNOSIS — J0101 Acute recurrent maxillary sinusitis: Secondary | ICD-10-CM

## 2023-10-04 DIAGNOSIS — D510 Vitamin B12 deficiency anemia due to intrinsic factor deficiency: Secondary | ICD-10-CM | POA: Insufficient documentation

## 2023-10-04 DIAGNOSIS — D801 Nonfamilial hypogammaglobulinemia: Secondary | ICD-10-CM

## 2023-10-04 LAB — CMP (CANCER CENTER ONLY)
ALT: 16 U/L (ref 0–44)
AST: 25 U/L (ref 15–41)
Albumin: 4.6 g/dL (ref 3.5–5.0)
Alkaline Phosphatase: 64 U/L (ref 38–126)
Anion gap: 9 (ref 5–15)
BUN: 11 mg/dL (ref 8–23)
CO2: 29 mmol/L (ref 22–32)
Calcium: 9.3 mg/dL (ref 8.9–10.3)
Chloride: 100 mmol/L (ref 98–111)
Creatinine: 0.81 mg/dL (ref 0.44–1.00)
GFR, Estimated: >60 mL/min (ref >60)
Glucose, Bld: 83 mg/dL (ref 70–99)
Potassium: 4 mmol/L (ref 3.5–5.1)
Sodium: 138 mmol/L (ref 135–145)
Total Bilirubin: 1.7 mg/dL — ABNORMAL HIGH (ref 0.0–1.2)
Total Protein: 6.6 g/dL (ref 6.5–8.1)

## 2023-10-04 LAB — CBC WITH DIFFERENTIAL (CANCER CENTER ONLY)
Abs Immature Granulocytes: 0.11 K/uL — ABNORMAL HIGH (ref 0.00–0.07)
Basophils Absolute: 0 K/uL (ref 0.0–0.1)
Basophils Relative: 1 %
Eosinophils Absolute: 0.1 K/uL (ref 0.0–0.5)
Eosinophils Relative: 1 %
HCT: 41.2 % (ref 36.0–46.0)
Hemoglobin: 13.7 g/dL (ref 12.0–15.0)
Immature Granulocytes: 1 %
Lymphocytes Relative: 31 %
Lymphs Abs: 2.5 K/uL (ref 0.7–4.0)
MCH: 34.7 pg — ABNORMAL HIGH (ref 26.0–34.0)
MCHC: 33.3 g/dL (ref 30.0–36.0)
MCV: 104.3 fL — ABNORMAL HIGH (ref 80.0–100.0)
Monocytes Absolute: 0.6 K/uL (ref 0.1–1.0)
Monocytes Relative: 7 %
Neutro Abs: 4.7 K/uL (ref 1.7–7.7)
Neutrophils Relative %: 59 %
Platelet Count: 117 K/uL — ABNORMAL LOW (ref 150–400)
RBC: 3.95 MIL/uL (ref 3.87–5.11)
RDW: 12.1 % (ref 11.5–15.5)
WBC Count: 8 K/uL (ref 4.0–10.5)
nRBC: 0 % (ref 0.0–0.2)

## 2023-10-04 LAB — LACTATE DEHYDROGENASE: LDH: 210 U/L — ABNORMAL HIGH (ref 98–192)

## 2023-10-04 LAB — VITAMIN B12: Vitamin B-12: 402 pg/mL (ref 180–914)

## 2023-10-04 MED ORDER — DEXTROSE 5 % IV SOLN: INTRAVENOUS | Status: DC

## 2023-10-04 MED ORDER — DIPHENHYDRAMINE HCL 25 MG PO CAPS
25.0000 mg | ORAL_CAPSULE | Freq: Once | ORAL | Status: DC
Start: 2023-10-04 — End: 2023-10-04

## 2023-10-04 MED ORDER — ACETAMINOPHEN 325 MG PO TABS
650.0000 mg | ORAL_TABLET | Freq: Once | ORAL | Status: DC
Start: 2023-10-04 — End: 2023-10-04

## 2023-10-04 MED ORDER — IMMUNE GLOBULIN (HUMAN) 20 GM/200ML IV SOLN
40.0000 g | Freq: Once | INTRAVENOUS | Status: AC
  Administered 2023-10-04: 40 g via INTRAVENOUS
  Filled 2023-10-04: qty 400

## 2023-10-04 MED ORDER — CYANOCOBALAMIN 1000 MCG/ML IJ SOLN: 1000.0000 ug | INTRAMUSCULAR | 1 refills | Status: AC

## 2023-10-04 NOTE — Progress Notes (Signed)
 Patient does not want to stay for the recommended 30 minute post IVIG observation. VSS. Patient discharged ambulatory without concerns.

## 2023-10-04 NOTE — Progress Notes (Signed)
 Hematology and Oncology Follow Up Visit  Kristy [name] [id number] Aug 02, 1955 68 y.o. 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   Since her last visit she has been working with Pulmonary and GI regarding failed PFT studies. 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 She is preparing to have more testing and likely an endoscopy for some dysphagia as well.   She has a current suspected UTI for which she is being with Macrobid. No fevers or vomiting, no severe abdominal pain. No recent sinus infections.   When we last saw her, there was no monoclonal spike in her blood.  The IgM level was 51 mg/dL.  She had normal kappa and light chains.  She has had no problem with bleeding.  There is no change in bowel or bladder habits.  Her appetite has been pretty good.  There is been no leg swelling.  Her last vitamin B12 level was 253 pg/mL. She is currently doing B12 injections every 5-6 weeks.   Overall, I will say performance status is probably ECOG 1.   Medications:  Allergies as of 10/04/2023       Reactions   Grapefruit Extract Other (See Comments)   Can not take due to Imbruvica .   Ibuprofen Other (See Comments)   Can not take due to Imbruvica         Medication List        Accurate as of October 04, 2023  9:40 AM. If you have any questions, ask your nurse or doctor.          amLODipine  5 MG tablet Commonly known as: NORVASC  Take 5 mg by mouth daily.   BD Eclipse Syringe/Needle 23G X 1 3 ML Misc Generic drug: SYRINGE-NEEDLE (DISP) 3 ML Inject 1,000 mcg into the muscle every 30 (thirty) days.   Cholecalciferol 25 MCG (1000 UT) tablet Take 1,000  Units by mouth daily.   cyanocobalamin  1000 MCG/ML injection Commonly known as: VITAMIN B12 Inject 1 mL (1,000 mcg total) into the muscle every 30 (thirty) days.   estradiol  0.1 MG/GM vaginal cream Commonly known as: ESTRACE  Place 1 Applicatorful vaginally 3 (three) times a week.   Imbruvica  420 MG tablet Generic drug: ibrutinib  TAKE 1 TABLET BY MOUTH DAILY AFTER BREAKFAST.   Lifitegrast  5 % Soln Place 1 drop into both eyes 2 (two) times daily.   nitrofurantoin (macrocrystal-monohydrate) 100 MG capsule Commonly known as: MACROBID Take 100 mg by mouth daily as needed (For UTI).        Allergies:  Allergies  Allergen Reactions   Grapefruit Extract Other (See Comments)    Can not take due to Imbruvica .   Ibuprofen Other (See Comments)    Can not take due to Imbruvica     Past Medical History, Surgical history, Social history, and Family History were reviewed and updated.  Review of Systems: Review of Systems  Constitutional: Negative.   HENT: Negative.    Eyes: Negative.   Respiratory: Negative.    Cardiovascular: Negative.   Gastrointestinal: Negative.   Genitourinary: Negative.   Musculoskeletal: Negative.   Skin: Negative.   Neurological: Negative.   Endo/Heme/Allergies: Negative.   Psychiatric/Behavioral:  The patient is  not nervous/anxious.      Physical Exam:  Today's Vitals   10/04/23 0914 10/04/23 0921  BP:  133/64  Pulse:  82  Resp:  16  Temp:  98.1 F (36.7 C)  TempSrc:  Oral  SpO2:  96%  Weight:  102 lb 0.6 oz (46.3 kg)  Height:  5' (1.524 m)  PainSc: 0-No pain    Body mass index is 19.93 kg/m.   Wt Readings from Last 3 Encounters:  10/04/23 102 lb 0.6 oz (46.3 kg)  08/16/23 103 lb (46.7 kg)  07/05/23 103 lb 1.9 oz (46.8 kg)    Physical Exam Vitals reviewed.  HENT:     Head: Normocephalic and atraumatic.  Eyes:     Pupils: Pupils are equal, round, and reactive to light.  Cardiovascular:     Rate and Rhythm: Normal rate and  regular rhythm.     Heart sounds: Normal heart sounds.  Pulmonary:     Effort: Pulmonary effort is normal.     Breath sounds: Normal breath sounds.  Abdominal:     General: Bowel sounds are normal.     Palpations: Abdomen is soft.  Musculoskeletal:        General: No tenderness or deformity. Normal range of motion.     Cervical back: Normal range of motion.  Lymphadenopathy:     Cervical: No cervical adenopathy.  Skin:    General: Skin is warm and dry.     Findings: No erythema or rash.  Neurological:     Mental Status: She is alert and oriented to person, place, and time.  Psychiatric:        Behavior: Behavior normal.        Thought Content: Thought content normal.        Judgment: Judgment normal.      Lab Results  Component Value Date   WBC 8.0 10/04/2023   HGB 13.7 10/04/2023   HCT 41.2 10/04/2023   MCV 104.3 (H) 10/04/2023   PLT 117 (L) 10/04/2023   No results found for: FERRITIN, IRON, TIBC, UIBC, IRONPCTSAT Lab Results  Component Value Date   RETICCTPCT 2.9 (H) 10/30/2014   RBC 3.95 10/04/2023   RETICCTABS 98.3 10/30/2014   Lab Results  Component Value Date   KPAFRELGTCHN 4.5 08/16/2023   LAMBDASER 2.6 (L) 08/16/2023   KAPLAMBRATIO 1.73 (H) 08/16/2023   Lab Results  Component Value Date   IGGSERUM 677 08/16/2023   IGA 10 (L) 08/16/2023   IGMSERUM 52 08/16/2023   Lab Results  Component Value Date   TOTALPROTELP 6.2 08/16/2023   ALBUMINELP 3.7 08/16/2023   A1GS 0.2 08/16/2023   A2GS 0.6 08/16/2023   BETS 1.0 08/16/2023   BETA2SER 0.2 02/15/2015   GAMS 0.7 08/16/2023   MSPIKE Not Observed 08/16/2023   SPEI Comment 01/04/2023     Chemistry      Component Value Date/Time   NA 138 10/04/2023 0854   NA 147 (H) 03/19/2017 0849   NA 139 03/16/2016 1337   K 4.0 10/04/2023 0854   K 4.5 03/19/2017 0849   K 3.8 03/16/2016 1337   CL 100 10/04/2023 0854   CL 108 03/19/2017 0849   CO2 29 10/04/2023 0854   CO2 31 03/19/2017 0849   CO2 27  03/16/2016 1337   BUN 11 10/04/2023 0854   BUN 11 03/19/2017 0849   BUN 5.2 (L) 03/16/2016 1337   CREATININE 0.81 10/04/2023 0854   CREATININE 1.0 03/19/2017 0849   CREATININE 0.7 03/16/2016 1337  Component Value Date/Time   CALCIUM 9.3 10/04/2023 0854   CALCIUM 9.6 03/19/2017 0849   CALCIUM 8.8 03/16/2016 1337   ALKPHOS 64 10/04/2023 0854   ALKPHOS 53 03/19/2017 0849   ALKPHOS 62 03/16/2016 1337   AST 25 10/04/2023 0854   AST 24 03/16/2016 1337   ALT 16 10/04/2023 0854   ALT 23 03/19/2017 0849   ALT 19 03/16/2016 1337   BILITOT 1.7 (H) 10/04/2023 0854   BILITOT 0.57 03/16/2016 1337     No diagnosis found.  Impression and Plan: Ms. Cirelli is a very pleasant 68 yo caucasian female with Waldenstrom's with acquired hypogammaglobulinemia and recurrent sinusitis.  Her Waldenstrom's continues to be under excellent control. She is doing well on the Imbruvica . She gets low dose IVIG to help with infection prevention.   B12 level pending- will increase dosing frequency if still low. Likely the cause of her macrocytosis.   IVIG today RTC 6 weeks MD, labs(CBC w/, CMP, B12, LDH, MM panel, light chains), IVIG  Kristy CHRISTELLA Dais, PA-C 7/17/20259:40 AM

## 2023-10-04 NOTE — Patient Instructions (Signed)
 Immune Globulin Injection What is this medication? IMMUNE GLOBULIN (im MUNE GLOB yoo lin) treats many immune system conditions. It works by Designer, multimedia extra antibodies. Antibodies are proteins made by the immune system that help protect the body. This medicine may be used for other purposes; ask your health care provider or pharmacist if you have questions. COMMON BRAND NAME(S): ASCENIV, Baygam, BIVIGAM, Carimune, Carimune NF, cutaquig, Cuvitru, Flebogamma, Flebogamma DIF, GamaSTAN, GamaSTAN S/D, Gamimune N, Gammagard, Gammagard S/D, Gammaked, Gammaplex, Gammar-P IV, Gamunex, Gamunex-C, Hizentra, Iveegam, Iveegam EN, Octagam, Panglobulin, Panglobulin NF, panzyga, Polygam S/D, Privigen, Sandoglobulin, Venoglobulin-S, Vigam, Vivaglobulin, Xembify What should I tell my care team before I take this medication? They need to know if you have any of these conditions: Blood clotting disorder Condition where you have excess fluid in your body, such as heart failure or edema Dehydration Diabetes Have had blood clots Heart disease Immune system conditions Kidney disease Low levels of IgA Recent or upcoming vaccine An unusual or allergic reaction to immune globulin, other medications, foods, dyes, or preservatives Pregnant or trying to get pregnant Breastfeeding How should I use this medication? This medication is infused into a vein or under the skin. It is usually given by your care team in a hospital or clinic setting. It may also be given at home. If you get this medication at home, you will be taught how to prepare and give it. Use exactly as directed. Take it as directed on the prescription label at the same time every day. Keep taking it unless your care team tells you to stop. It is important that you put your used needles and syringes in a special sharps container. Do not put them in a trash can. If you do not have a sharps container, call your pharmacist or care team to get one. Talk to  your care team about the use of this medication in children. While it may be given to children for selected conditions, precautions do apply. Overdosage: If you think you have taken too much of this medicine contact a poison control center or emergency room at once. NOTE: This medicine is only for you. Do not share this medicine with others. What if I miss a dose? If you get this medication at the hospital or clinic: It is important not to miss your dose. Call your care team if you are unable to keep an appointment. If you give yourself this medication at home: If you miss a dose, take it as soon as you can. Then continue your normal schedule. If it is almost time for your next dose, take only that dose. Do not take double or extra doses. Call your care team with questions. What may interact with this medication? Live virus vaccines This list may not describe all possible interactions. Give your health care provider a list of all the medicines, herbs, non-prescription drugs, or dietary supplements you use. Also tell them if you smoke, drink alcohol, or use illegal drugs. Some items may interact with your medicine. What should I watch for while using this medication? Your condition will be monitored carefully while you are receiving this medication. Tell your care team if your symptoms do not start to get better or if they get worse. You may need blood work done while you are taking this medication. This medication increases the risk of blood clots. People with heart, blood vessel, or blood clotting conditions are more likely to develop a blood clot. Other risk factors include advanced age, estrogen  use, tobacco use, lack of movement, and being overweight. This medication can decrease the response to a vaccine. If you need to get vaccinated, tell your care team if you have received this medication within the last year. Extra booster doses may be needed. Talk to your care team to see if a different  vaccination schedule is needed. If you have diabetes, you may get a falsely elevated blood sugar reading. Talk to your care team about how to check your blood sugar while taking this medication. What side effects may I notice from receiving this medication? Side effects that you should report to your care team as soon as possible: Allergic reactions--skin rash, itching, hives, swelling of the face, lips, tongue, or throat Blood clot--pain, swelling, or warmth in the leg, shortness of breath, chest pain Fever, neck pain or stiffness, sensitivity to light, headache, nausea, vomiting, confusion, which may be signs of meningitis Hemolytic anemia--unusual weakness or fatigue, dizziness, headache, trouble breathing, dark urine, yellowing skin or eyes Kidney injury--decrease in the amount of urine, swelling of the ankles, hands, or feet Low sodium level--muscle weakness, fatigue, dizziness, headache, confusion Shortness of breath or trouble breathing, cough, unusual weakness or fatigue, blue skin or lips Side effects that usually do not require medical attention (report these to your care team if they continue or are bothersome): Chills Diarrhea Fever Headache Nausea This list may not describe all possible side effects. Call your doctor for medical advice about side effects. You may report side effects to FDA at 1-800-FDA-1088. Where should I keep my medication? Keep out of the reach of children and pets. You will be instructed on how to store this medication. Get rid of any unused medication after the expiration date. To get rid of medications that are no longer needed or have expired: Take the medication to a medication take-back program. Check with your pharmacy or law enforcement to find a location. If you cannot return the medication, ask your pharmacist or care team how to get rid of this medication safely. NOTE: This sheet is a summary. It may not cover all possible information. If you have  questions about this medicine, talk to your doctor, pharmacist, or health care provider.  2024 Elsevier/Gold Standard (2023-02-16 00:00:00)

## 2023-10-05 LAB — IGG, IGA, IGM
IgA: 9 mg/dL — ABNORMAL LOW (ref 87–352)
IgG (Immunoglobin G), Serum: 616 mg/dL (ref 586–1602)
IgM (Immunoglobulin M), Srm: 57 mg/dL (ref 26–217)

## 2023-10-05 LAB — KAPPA/LAMBDA LIGHT CHAINS
Kappa free light chain: 5.8 mg/L (ref 3.3–19.4)
Kappa, lambda light chain ratio: 2.42 — ABNORMAL HIGH (ref 0.26–1.65)
Lambda free light chains: 2.4 mg/L — ABNORMAL LOW (ref 5.7–26.3)

## 2023-10-08 LAB — PROTEIN ELECTROPHORESIS, SERUM, WITH REFLEX
A/G Ratio: 1.7 (ref 0.7–1.7)
Albumin ELP: 3.9 g/dL (ref 2.9–4.4)
Alpha-1-Globulin: 0.3 g/dL (ref 0.0–0.4)
Alpha-2-Globulin: 0.6 g/dL (ref 0.4–1.0)
Beta Globulin: 0.8 g/dL (ref 0.7–1.3)
Gamma Globulin: 0.5 g/dL (ref 0.4–1.8)
Globulin, Total: 2.3 g/dL (ref 2.2–3.9)
Total Protein ELP: 6.2 g/dL (ref 6.0–8.5)

## 2023-10-12 ENCOUNTER — Encounter: Payer: Self-pay | Admitting: Podiatry

## 2023-10-12 ENCOUNTER — Ambulatory Visit (INDEPENDENT_AMBULATORY_CARE_PROVIDER_SITE_OTHER): Admitting: Podiatry

## 2023-10-12 DIAGNOSIS — B351 Tinea unguium: Secondary | ICD-10-CM | POA: Diagnosis not present

## 2023-10-12 NOTE — Progress Notes (Signed)
  Subjective:  Patient ID: Kristy [name], female    DOB: August 26, 1955,   MRN: [id number]  Chief Complaint  Patient presents with   Nail Problem    My right big toe had a nail taken off.  It started draining.  It stopped draining but it still feels swollen.    68 y.o. female presents for concern as above. Has been soaking but denies any treatments. She relates it is not too tender but concerned for what is happening. Started after going to a salon . Denies any other pedal complaints. Denies n/v/f/c.   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 CFT <3 seconds. Normal hair growth on digits. No edema.  Skin. No lacerations or abrasions bilateral feet. Right hallux nail discolored and with subungual debris and detached from underlying nail bed.  Musculoskeletal: MMT 5/5 bilateral lower extremities in DF, PF, Inversion and Eversion. Deceased ROM in DF of ankle joint.  Neurological: Sensation intact to light touch.   Assessment:   1. Onychomycosis      Plan:  Patient was evaluated and treated and all questions answered. -Examined patient -Discussed treatment options for painful dystrophic nails  -Clinical picture and Fungal culture was obtained by removing a portion of the hard nail itself from each of the involved toenails using a sterile nail nipper and sent to [facility] lab. Patient tolerated the biopsy procedure well without discomfort or need for anesthesia.  -Discussed fungal nail treatment options including oral, topical, and laser treatments.  -Patient to return in 4 weeks for follow up evaluation and discussion of fungal culture results or sooner if symptoms worsen.   Asberry Failing, DPM

## 2023-10-12 NOTE — Addendum Note (Signed)
 Addended by: WAYLAN ELIDIA PARAS on: 10/12/2023 10:47 AM   Modules accepted: Orders

## 2023-10-15 ENCOUNTER — Other Ambulatory Visit (HOSPITAL_COMMUNITY): Payer: Self-pay

## 2023-10-16 ENCOUNTER — Telehealth: Payer: Self-pay | Admitting: *Deleted

## 2023-10-16 NOTE — Telephone Encounter (Signed)
 10/15/2023 Received a forwarded email for a CoverMyMeds prior authorization for a patient of Dr. Timmy, Key: [id number] for Imbruvica  420 mg which is not a supportive medication for forms staff.  Secure chat message sent to today's JACQUI Darryle Law [facility] pharmacist who added .Darryle Long CPhT to take care of this.  10/16/2023 this nurse noted Key: [id number]  on CoverMyMeds. Medication Prior Authorization Status  Processed CoverMyMeds KEY: [id number] for Imbruvica  420 mg tabs.  Approved Today  Per [employer]  PA Case ID: [id number]   Effective 09/16/2023 through 10/15/2024.

## 2023-10-18 ENCOUNTER — Other Ambulatory Visit: Payer: Self-pay

## 2023-10-25 ENCOUNTER — Other Ambulatory Visit: Payer: Self-pay

## 2023-10-29 ENCOUNTER — Other Ambulatory Visit (HOSPITAL_COMMUNITY): Payer: Self-pay

## 2023-10-29 ENCOUNTER — Other Ambulatory Visit: Payer: Self-pay

## 2023-10-29 NOTE — Progress Notes (Signed)
 Specialty Pharmacy Ongoing Clinical Assessment Note  Kristy [name] is a 68 y.o. female who is being followed by the specialty pharmacy service for RxSp Oncology   Patient's specialty medication(s) reviewed today: Ibrutinib  (IMBRUVICA )   Missed doses in the last 4 weeks: 0   Patient/Caregiver did not have any additional questions or concerns.   Therapeutic benefit summary: Patient is achieving benefit   Adverse events/side effects summary: No adverse events/side effects   Patient's therapy is appropriate to: Continue    Goals Addressed             This Visit's Progress    Stabilization of disease   On track    Patient is on track. Patient will maintain adherence.  Per office visit notes from 10/04/23, her Waldenstrom's remains under excellent control.          Follow up: 6 months  Silvano LOISE Dolly Specialty Pharmacist

## 2023-10-29 NOTE — Progress Notes (Signed)
 Specialty Pharmacy Refill Coordination Note  ESMAY AMSPACHER is a 68 y.o. female contacted today regarding refills of specialty medication(s) Ibrutinib  (IMBRUVICA )   Patient requested Delivery   Delivery date: 11/06/23   Verified address: [address]    [place] [address]   Medication will be filled on 11/05/23.

## 2023-10-31 ENCOUNTER — Other Ambulatory Visit (HOSPITAL_COMMUNITY): Payer: Self-pay

## 2023-10-31 ENCOUNTER — Telehealth: Payer: Self-pay

## 2023-10-31 NOTE — Telephone Encounter (Signed)
 Oral Oncology Patient Advocate Encounter   Received notification that prior authorization for Imbruvica  is due for renewal.   PA submitted on 10/31/23 Key [id number] Status is pending      Charlott Hamilton,  CPhT-Adv  she/her/hers [facility]  [facility] Specialty Pharmacy Services Pharmacy Technician Patient Advocate Specialist III [facility] Phone: [phone]  Fax: [phone] [email or web address]

## 2023-10-31 NOTE — Telephone Encounter (Signed)
 Oral Oncology Patient Advocate Encounter  Prior Authorization renewal for Imbruvica  has been approved.    Key# [id number] (Latent/CMM) Effective dates: 10/31/23 through 10/15/24  Patients co-pay is $0.00 with Copay card .     Charlott Hamilton,  CPhT-Adv  she/her/hers [facility]  [facility] Specialty Pharmacy Services Pharmacy Technician Patient Advocate Specialist III [facility] Phone: [phone]  Fax: [phone] [email or web address]

## 2023-11-05 ENCOUNTER — Other Ambulatory Visit: Payer: Self-pay

## 2023-11-15 ENCOUNTER — Inpatient Hospital Stay (HOSPITAL_BASED_OUTPATIENT_CLINIC_OR_DEPARTMENT_OTHER): Admitting: Hematology & Oncology

## 2023-11-15 ENCOUNTER — Other Ambulatory Visit: Payer: Self-pay

## 2023-11-15 ENCOUNTER — Inpatient Hospital Stay

## 2023-11-15 ENCOUNTER — Inpatient Hospital Stay: Attending: Hematology & Oncology

## 2023-11-15 VITALS — BP 138/75 | HR 73 | Temp 98.1°F | Resp 16 | Ht 60.0 in | Wt 102.0 lb

## 2023-11-15 VITALS — BP 128/68 | HR 69 | Temp 98.0°F | Resp 16

## 2023-11-15 DIAGNOSIS — C8800 Waldenstrom macroglobulinemia not having achieved remission: Secondary | ICD-10-CM | POA: Insufficient documentation

## 2023-11-15 DIAGNOSIS — D801 Nonfamilial hypogammaglobulinemia: Secondary | ICD-10-CM | POA: Diagnosis present

## 2023-11-15 DIAGNOSIS — E538 Deficiency of other specified B group vitamins: Secondary | ICD-10-CM | POA: Diagnosis not present

## 2023-11-15 DIAGNOSIS — D510 Vitamin B12 deficiency anemia due to intrinsic factor deficiency: Secondary | ICD-10-CM | POA: Diagnosis not present

## 2023-11-15 DIAGNOSIS — J0101 Acute recurrent maxillary sinusitis: Secondary | ICD-10-CM

## 2023-11-15 LAB — VITAMIN B12: Vitamin B-12: 297 pg/mL (ref 180–914)

## 2023-11-15 LAB — CBC WITH DIFFERENTIAL (CANCER CENTER ONLY)
Abs Immature Granulocytes: 0.05 K/uL (ref 0.00–0.07)
Basophils Absolute: 0 K/uL (ref 0.0–0.1)
Basophils Relative: 0 %
Eosinophils Absolute: 0.1 K/uL (ref 0.0–0.5)
Eosinophils Relative: 1 %
HCT: 40.2 % (ref 36.0–46.0)
Hemoglobin: 13.6 g/dL (ref 12.0–15.0)
Immature Granulocytes: 1 %
Lymphocytes Relative: 31 %
Lymphs Abs: 2.4 K/uL (ref 0.7–4.0)
MCH: 35.1 pg — ABNORMAL HIGH (ref 26.0–34.0)
MCHC: 33.8 g/dL (ref 30.0–36.0)
MCV: 103.6 fL — ABNORMAL HIGH (ref 80.0–100.0)
Monocytes Absolute: 0.6 K/uL (ref 0.1–1.0)
Monocytes Relative: 8 %
Neutro Abs: 4.6 K/uL (ref 1.7–7.7)
Neutrophils Relative %: 59 %
Platelet Count: 92 K/uL — ABNORMAL LOW (ref 150–400)
RBC: 3.88 MIL/uL (ref 3.87–5.11)
RDW: 12.4 % (ref 11.5–15.5)
WBC Count: 7.7 K/uL (ref 4.0–10.5)
nRBC: 0 % (ref 0.0–0.2)

## 2023-11-15 LAB — CMP (CANCER CENTER ONLY)
ALT: 24 U/L (ref 0–44)
AST: 29 U/L (ref 15–41)
Albumin: 4.3 g/dL (ref 3.5–5.0)
Alkaline Phosphatase: 60 U/L (ref 38–126)
Anion gap: 13 (ref 5–15)
BUN: 9 mg/dL (ref 8–23)
CO2: 24 mmol/L (ref 22–32)
Calcium: 9.3 mg/dL (ref 8.9–10.3)
Chloride: 102 mmol/L (ref 98–111)
Creatinine: 0.84 mg/dL (ref 0.44–1.00)
GFR, Estimated: >60 mL/min (ref >60)
Glucose, Bld: 83 mg/dL (ref 70–99)
Potassium: 3.8 mmol/L (ref 3.5–5.1)
Sodium: 139 mmol/L (ref 135–145)
Total Bilirubin: 1.2 mg/dL (ref 0.0–1.2)
Total Protein: 6.4 g/dL — ABNORMAL LOW (ref 6.5–8.1)

## 2023-11-15 LAB — LACTATE DEHYDROGENASE: LDH: 213 U/L — ABNORMAL HIGH (ref 98–192)

## 2023-11-15 MED ORDER — IMMUNE GLOBULIN (HUMAN) 20 GM/200ML IV SOLN
40.0000 g | Freq: Once | INTRAVENOUS | Status: AC
  Administered 2023-11-15: 40 g via INTRAVENOUS
  Filled 2023-11-15: qty 400

## 2023-11-15 MED ORDER — ACETAMINOPHEN 325 MG PO TABS: 650.0000 mg | ORAL_TABLET | Freq: Once | ORAL | Status: DC

## 2023-11-15 MED ORDER — DIPHENHYDRAMINE HCL 25 MG PO CAPS
25.0000 mg | ORAL_CAPSULE | Freq: Once | ORAL | Status: DC
Start: 2023-11-15 — End: 2023-11-15

## 2023-11-15 MED ORDER — DEXTROSE 5 % IV SOLN: INTRAVENOUS | Status: DC

## 2023-11-15 NOTE — Progress Notes (Signed)
 Hematology and Oncology Follow Up Visit  Kristy [name] [id number] [birth year] 68 y.o. 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  The big news is that she will be grandmother again.  Her daughter is expecting a grandson in December.  This is very exciting for Kristy [name].  She already has a 9-year-old granddaughter  Apparently, she says she needs to have a bronchoscopy.  Apparently, she has bronchiectasis.  She had a CT scan that was done on 09/06/2023.  This shows some mild bronchiectasis.  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  This is not commented on the CT scan.Kristy [name]   Hopefully, she will be able to find a pulmonologist to help her.  Her immunoglobulin levels have been doing okay.  Her IgG [name] is 660 mg/dL.  Her IgM [name] is 57 mg/dL.  Her last monoclonal spike was not found in the blood.  Her last vitamin B12 [name] in July was ordered to picograms per milliliter.  Her appetite is doing okay.  She has had no nausea or vomiting.  She has had no fever.  There has been no bleeding.  She has had no change in bowel or bladder habits.  She has had no rashes.  There has been no swollen lymph nodes.  Overall, her performance status is ECOG 1.  Medications:  Allergies as of 11/15/2023       Reactions   Grapefruit Extract Other (See Comments)   Can not take due to Imbruvica .   Ibuprofen Other (See Comments)   Can not take due to Imbruvica         Medication List        Accurate as of November 15, 2023  8:35 AM. If you have any questions, ask your nurse or doctor.          amLODipine  5 MG tablet Commonly known as: NORVASC  Take 5 mg by mouth daily.   BD Eclipse  Syringe/Needle 23G X 1 3 ML Misc Generic drug: SYRINGE-NEEDLE (DISP) 3 ML Inject 1,000 mcg into the muscle every 30 (thirty) days.   Cholecalciferol 25 MCG (1000 UT) tablet Take 1,000 Units by mouth daily.   cyanocobalamin  1000 MCG/ML injection Commonly known as: VITAMIN B12 Inject 1 mL (1,000 mcg total) into the muscle every 30 (thirty) days.   estradiol  0.1 MG/GM vaginal cream Commonly known as: ESTRACE  Place 1 Applicatorful vaginally 3 (three) times a week.   Imbruvica  420 MG tablet Generic drug: ibrutinib  TAKE 1 TABLET BY MOUTH DAILY AFTER BREAKFAST.   Lifitegrast  5 % Soln Place 1 drop into both eyes 2 (two) times daily.   nitrofurantoin (macrocrystal-monohydrate) 100 MG capsule Commonly known as: MACROBID Take 100 mg by mouth daily as needed (For UTI).        Allergies:  Allergies  Allergen Reactions   Grapefruit Extract Other (See Comments)    Can not take due to Imbruvica .   Ibuprofen Other (See Comments)    Can not take due to Imbruvica     Past Medical History, Surgical history, Social history, and Family History were reviewed and updated.  Review of Systems: Review of Systems  Constitutional: Negative.   HENT: Negative.    Eyes: Negative.  Respiratory: Negative.    Cardiovascular: Negative.   Gastrointestinal: Negative.   Genitourinary: Negative.   Musculoskeletal: Negative.   Skin: Negative.   Neurological: Negative.   Endo/Heme/Allergies: Negative.   Psychiatric/Behavioral:  The patient is not nervous/anxious.      Physical Exam:  Temperature 98.1.  Pulse 73.  Blood pressure 138/75.  Weight is 102 pounds.  .   Wt Readings from Last 3 Encounters:  11/15/23 102 lb (46.3 kg)  10/04/23 102 lb 0.6 oz (46.3 kg)  08/16/23 103 lb (46.7 kg)    Physical Exam Vitals reviewed.  HENT:     Head: Normocephalic and atraumatic.  Eyes:     Pupils: Pupils are equal, round, and reactive to light.  Cardiovascular:     Rate and Rhythm: Normal rate  and regular rhythm.     Heart sounds: Normal heart sounds.  Pulmonary:     Effort: Pulmonary effort is normal.     Breath sounds: Normal breath sounds.  Abdominal:     General: Bowel sounds are normal.     Palpations: Abdomen is soft.  Musculoskeletal:        General: No tenderness or deformity. Normal range of motion.     Cervical back: Normal range of motion.  Lymphadenopathy:     Cervical: No cervical adenopathy.  Skin:    General: Skin is warm and dry.     Findings: No erythema or rash.  Neurological:     Mental Status: She is alert and oriented to person, place, and time.  Psychiatric:        Behavior: Behavior normal.        Thought Content: Thought content normal.        Judgment: Judgment normal.      Lab Results  Component Value Date   WBC 7.7 11/15/2023   HGB 13.6 11/15/2023   HCT 40.2 11/15/2023   MCV 103.6 (H) 11/15/2023   PLT 92 (L) 11/15/2023   No results found for: FERRITIN, IRON, TIBC, UIBC, IRONPCTSAT Lab Results  Component Value Date   RETICCTPCT 2.9 (H) 10/30/2014   RBC 3.88 11/15/2023   RETICCTABS 98.3 10/30/2014   Lab Results  Component Value Date   KPAFRELGTCHN 5.8 10/04/2023   LAMBDASER 2.4 (L) 10/04/2023   KAPLAMBRATIO 2.42 (H) 10/04/2023   Lab Results  Component Value Date   IGGSERUM 616 10/04/2023   IGA 9 (L) 10/04/2023   IGMSERUM 57 10/04/2023   Lab Results  Component Value Date   TOTALPROTELP 6.2 10/04/2023   ALBUMINELP 3.9 10/04/2023   A1GS 0.3 10/04/2023   A2GS 0.6 10/04/2023   BETS 0.8 10/04/2023   BETA2SER 0.2 02/15/2015   GAMS 0.5 10/04/2023   MSPIKE Not Observed 10/04/2023   SPEI Comment 01/04/2023     Chemistry      Component Value Date/Time   NA 138 10/04/2023 0854   NA 147 (H) 03/19/2017 0849   NA 139 03/16/2016 1337   K 4.0 10/04/2023 0854   K 4.5 03/19/2017 0849   K 3.8 03/16/2016 1337   CL 100 10/04/2023 0854   CL 108 03/19/2017 0849   CO2 29 10/04/2023 0854   CO2 31 03/19/2017 0849   CO2  27 03/16/2016 1337   BUN 11 10/04/2023 0854   BUN 11 03/19/2017 0849   BUN 5.2 (L) 03/16/2016 1337   CREATININE 0.81 10/04/2023 0854   CREATININE 1.0 03/19/2017 0849   CREATININE 0.7 03/16/2016 1337      Component Value Date/Time   CALCIUM  9.3 10/04/2023 0854   CALCIUM 9.6 03/19/2017 0849   CALCIUM 8.8 03/16/2016 1337   ALKPHOS 64 10/04/2023 0854   ALKPHOS 53 03/19/2017 0849   ALKPHOS 62 03/16/2016 1337   AST 25 10/04/2023 0854   AST 24 03/16/2016 1337   ALT 16 10/04/2023 0854   ALT 23 03/19/2017 0849   ALT 19 03/16/2016 1337   BILITOT 1.7 (H) 10/04/2023 0854   BILITOT 0.57 03/16/2016 1337       Impression and Plan: Kristy [name] is a very pleasant 68 yo caucasian female with Waldenstrom's with acquired hypogammaglobulinemia and recurrent sinusitis.  Her Waldenstrom's continues to be under excellent control.  I really do not see that this will be a problem with her.  She is doing well on the Imbruvica .   I think she is doing her vitamin B12 at home.  I am so happy that she can have a grand son.  Hopefully, everybody will be healthy.  We will go ahead with the IVIG today.  She will get her vitamin B-12 today.  We will get her back in 6 weeks.    Maude JONELLE Crease, MD 8/28/20258:35 AM

## 2023-11-15 NOTE — Patient Instructions (Signed)
 Immune Globulin  Injection What is this medication? IMMUNE GLOBULIN  (im MUNE GLOB yoo lin) treats many immune system conditions. It works by Designer, multimedia extra antibodies. Antibodies are proteins made by the immune system that help protect the body. This medicine may be used for other purposes; ask your health care provider or pharmacist if you have questions. COMMON BRAND NAME(S): ASCENIV, Baygam, BIVIGAM, Carimune, Carimune NF, cutaquig, Cuvitru, Flebogamma, Flebogamma DIF, GamaSTAN, GamaSTAN S/D, Gamimune N, Gammagard, Gammagard S/D, Gammaked, Gammaplex, Gammar-P IV, Gamunex, Gamunex-C, Hizentra, Iveegam, Iveegam EN, Octagam, Panglobulin, Panglobulin NF, panzyga, Polygam S/D, Privigen , Sandoglobulin, Venoglobulin-S, Vigam, Vivaglobulin, Xembify What should I tell my care team before I take this medication? They need to know if you have any of these conditions: Blood clotting disorder Condition where you have excess fluid in your body, such as heart failure or edema Dehydration Diabetes Have had blood clots Heart disease Immune system conditions Kidney disease Low levels of IgA Recent or upcoming vaccine An unusual or allergic reaction to immune globulin , other medications, foods, dyes, or preservatives Pregnant or trying to get pregnant Breastfeeding How should I use this medication? This medication is infused into a vein or under the skin. It is usually given by your care team in a hospital or clinic setting. It may also be given at home. If you get this medication at home, you will be taught how to prepare and give it. Use exactly as directed. Take it as directed on the prescription label at the same time every day. Keep taking it unless your care team tells you to stop. It is important that you put your used needles and syringes in a special sharps container. Do not put them in a trash can. If you do not have a sharps container, call your pharmacist or care team to get one. Talk to  your care team about the use of this medication in children. While it may be given to children for selected conditions, precautions do apply. Overdosage: If you think you have taken too much of this medicine contact a poison control center or emergency room at once. NOTE: This medicine is only for you. Do not share this medicine with others. What if I miss a dose? If you get this medication at the hospital or clinic: It is important not to miss your dose. Call your care team if you are unable to keep an appointment. If you give yourself this medication at home: If you miss a dose, take it as soon as you can. Then continue your normal schedule. If it is almost time for your next dose, take only that dose. Do not take double or extra doses. Call your care team with questions. What may interact with this medication? Live virus vaccines This list may not describe all possible interactions. Give your health care provider a list of all the medicines, herbs, non-prescription drugs, or dietary supplements you use. Also tell them if you smoke, drink alcohol, or use illegal drugs. Some items may interact with your medicine. What should I watch for while using this medication? Your condition will be monitored carefully while you are receiving this medication. Tell your care team if your symptoms do not start to get better or if they get worse. You may need blood work done while you are taking this medication. This medication increases the risk of blood clots. People with heart, blood vessel, or blood clotting conditions are more likely to develop a blood clot. Other risk factors include advanced age, estrogen  use, tobacco use, lack of movement, and being overweight. This medication can decrease the response to a vaccine. If you need to get vaccinated, tell your care team if you have received this medication within the last year. Extra booster doses may be needed. Talk to your care team to see if a different  vaccination schedule is needed. If you have diabetes, you may get a falsely elevated blood sugar reading. Talk to your care team about how to check your blood sugar while taking this medication. What side effects may I notice from receiving this medication? Side effects that you should report to your care team as soon as possible: Allergic reactions--skin rash, itching, hives, swelling of the face, lips, tongue, or throat Blood clot--pain, swelling, or warmth in the leg, shortness of breath, chest pain Fever, neck pain or stiffness, sensitivity to light, headache, nausea, vomiting, confusion, which may be signs of meningitis Hemolytic anemia--unusual weakness or fatigue, dizziness, headache, trouble breathing, dark urine, yellowing skin or eyes Kidney injury--decrease in the amount of urine, swelling of the ankles, hands, or feet Low sodium level--muscle weakness, fatigue, dizziness, headache, confusion Shortness of breath or trouble breathing, cough, unusual weakness or fatigue, blue skin or lips Side effects that usually do not require medical attention (report these to your care team if they continue or are bothersome): Chills Diarrhea Fever Headache Nausea This list may not describe all possible side effects. Call your doctor for medical advice about side effects. You may report side effects to FDA at 1-800-FDA-1088. Where should I keep my medication? Keep out of the reach of children and pets. You will be instructed on how to store this medication. Get rid of any unused medication after the expiration date. To get rid of medications that are no longer needed or have expired: Take the medication to a medication take-back program. Check with your pharmacy or law enforcement to find a location. If you cannot return the medication, ask your pharmacist or care team how to get rid of this medication safely. NOTE: This sheet is a summary. It may not cover all possible information. If you have  questions about this medicine, talk to your doctor, pharmacist, or health care provider.  2024 Elsevier/Gold Standard (2023-02-16 00:00:00)

## 2023-11-16 LAB — KAPPA/LAMBDA LIGHT CHAINS
Kappa free light chain: 4.2 mg/L (ref 3.3–19.4)
Kappa, lambda light chain ratio: 1.68 — ABNORMAL HIGH (ref 0.26–1.65)
Lambda free light chains: 2.5 mg/L — ABNORMAL LOW (ref 5.7–26.3)

## 2023-11-21 LAB — MULTIPLE MYELOMA PANEL, SERUM
Albumin SerPl Elph-Mcnc: 3.8 g/dL (ref 2.9–4.4)
Albumin/Glob SerPl: 1.6 (ref 0.7–1.7)
Alpha 1: 0.2 g/dL (ref 0.0–0.4)
Alpha2 Glob SerPl Elph-Mcnc: 0.7 g/dL (ref 0.4–1.0)
B-Globulin SerPl Elph-Mcnc: 0.9 g/dL (ref 0.7–1.3)
Gamma Glob SerPl Elph-Mcnc: 0.7 g/dL (ref 0.4–1.8)
Globulin, Total: 2.5 g/dL (ref 2.2–3.9)
IgA: 10 mg/dL — ABNORMAL LOW (ref 87–352)
IgG (Immunoglobin G), Serum: 653 mg/dL (ref 586–1602)
IgM (Immunoglobulin M), Srm: 57 mg/dL (ref 26–217)
Total Protein ELP: 6.3 g/dL (ref 6.0–8.5)

## 2023-11-22 ENCOUNTER — Ambulatory Visit (INDEPENDENT_AMBULATORY_CARE_PROVIDER_SITE_OTHER): Admitting: Podiatry

## 2023-11-22 ENCOUNTER — Encounter: Payer: Self-pay | Admitting: Podiatry

## 2023-11-22 DIAGNOSIS — B351 Tinea unguium: Secondary | ICD-10-CM | POA: Diagnosis not present

## 2023-11-22 NOTE — Patient Instructions (Signed)

## 2023-11-22 NOTE — Progress Notes (Signed)
  Subjective:  Patient ID: Kristy [name], female    DOB: Oct 24, 1955,   MRN: [id number]  Chief Complaint  Patient presents with   Nail Problem    Check up and tell me about the finding of my nail that she sent off.    68 y.o. female presents for follow-up of nail changes and to discuss culture results. . Denies any other pedal complaints. Denies n/v/f/c.   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 CFT <3 seconds. Normal hair growth on digits. No edema.  Skin. No lacerations or abrasions bilateral feet. Right hallux nail discolored and with subungual debris and detached from underlying nail bed.  Musculoskeletal: MMT 5/5 bilateral lower extremities in DF, PF, Inversion and Eversion. Deceased ROM in DF of ankle joint.  Neurological: Sensation intact to light touch.   Assessment:   1. Onychomycosis       Plan:  Patient was evaluated and treated and all questions answered. -Examined patient -Discussed treatment options for painful dystrophic nails  -Culture positive for saprophytic mold and also note acute trauma.  -Discussed fungal nail treatment options including oral, topical, and laser treatments.  -Patient opts to try laser treatment. Will get scheduled.  -Patient to return in 4 months for recheck.    Asberry Failing, DPM

## 2023-11-23 ENCOUNTER — Encounter: Payer: Self-pay | Admitting: Podiatry

## 2023-11-23 ENCOUNTER — Ambulatory Visit

## 2023-11-23 ENCOUNTER — Ambulatory Visit (INDEPENDENT_AMBULATORY_CARE_PROVIDER_SITE_OTHER): Admitting: Podiatry

## 2023-11-23 DIAGNOSIS — M722 Plantar fascial fibromatosis: Secondary | ICD-10-CM | POA: Diagnosis not present

## 2023-11-23 MED ORDER — METHYLPREDNISOLONE 4 MG PO TBPK
ORAL_TABLET | ORAL | 0 refills | Status: DC
Start: 2023-11-23 — End: 2024-02-11

## 2023-11-23 MED ORDER — CICLOPIROX 8 % EX SOLN: Freq: Every day | CUTANEOUS | 0 refills | Status: AC

## 2023-11-23 NOTE — Patient Instructions (Signed)

## 2023-11-23 NOTE — Addendum Note (Signed)
 Addended by: WAYLAN ELIDIA PARAS on: 11/23/2023 10:24 AM   Modules accepted: Orders

## 2023-11-23 NOTE — Progress Notes (Signed)
  Subjective:  Patient ID: Kristy [name], female    DOB: 1955/05/27,   MRN: [id number]  Chief Complaint  Patient presents with   Plantar Fasciitis    Today, she's going to help me with the Plantar Fasciitis.  She's going to give me some exercises to do.  I saw someone under Dr. Rinda before and was diagnosed with Plantar Fasciitis.    68 y.o. female presents for new concern of heel pain that has been ongoing for about a year. Relates pain with first steps in the morning and after being on her feet for periods of the day. She denies any treatments but has been told in the past likely had plantar fasciitis.   . Denies any other pedal complaints. Denies n/v/f/c.   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 CFT <3 seconds. Normal hair growth on digits. No edema.  Skin. No lacerations or abrasions bilateral feet. Bilateral hallux nails thickened and dystrophic.  Musculoskeletal: MMT 5/5 bilateral lower extremities in DF, PF, Inversion and Eversion. Deceased ROM in DF of ankle joint. Tender to the medial calcaneal tubercle bilaterally . No pain with achilles, PT or arch. No pain with calcaneal squeeze.  Neurological: Sensation intact to light touch.   Assessment:   1. Plantar fasciitis, right   2. Plantar fasciitis of left foot      Plan:  Patient was evaluated and treated and all questions answered. Discussed plantar fasciitis with patient.  X-rays reviewed and discussed with patient. No acute fractures or dislocations noted. Mild spurring noted at inferior calcaneus.  Discussed treatment options including, ice, NSAIDS, supportive shoes, bracing, and stretching. Stretching exercises provided to be done on a daily basis.   Medrol  dose pack sent to pharmacy   PF brace dispensed bilateral Follow-up 6 weeks or sooner if any problems arise. In the meantime, encouraged to call the office with any questions, concerns, change in symptoms.   Patient would also like to switch fungal treatments from laser to penlac . Penlac  sent to pharmacy.    Asberry Failing, DPM

## 2023-11-27 ENCOUNTER — Other Ambulatory Visit: Payer: Self-pay

## 2023-11-27 NOTE — Progress Notes (Signed)
 Specialty Pharmacy Refill Coordination Note  Kristy [name] is a 68 y.o. female contacted today regarding refills of specialty medication(s) Ibrutinib  (IMBRUVICA )   Patient requested Delivery   Delivery date: 12/04/23   Verified address: [address]   [place] [address]   Medication will be filled on 12/03/23.

## 2023-11-27 NOTE — Progress Notes (Signed)
 Clinical Intervention Note  Clinical Intervention Notes: Patient stated that her provider prescribed a Medrol  Dose Pack. No drug-drug interactions were identified. I provided counseling on infection prevention, including avoiding exposure to germs and practicing proper hand hygiene.   Clinical Intervention Outcomes: Prevention of an adverse drug event   Silvano LOISE Blair Karel Santa

## 2023-12-03 ENCOUNTER — Telehealth: Payer: Self-pay | Admitting: Gastroenterology

## 2023-12-03 ENCOUNTER — Other Ambulatory Visit: Payer: Self-pay

## 2023-12-03 NOTE — Telephone Encounter (Signed)
 Patient advised to have most recent ov sent over for Dr Charlanne review on transfer of care.   Patient requesting to have a EGD

## 2023-12-24 ENCOUNTER — Other Ambulatory Visit: Payer: Self-pay

## 2023-12-26 ENCOUNTER — Other Ambulatory Visit (HOSPITAL_COMMUNITY): Payer: Self-pay

## 2023-12-26 ENCOUNTER — Other Ambulatory Visit: Payer: Self-pay

## 2023-12-26 ENCOUNTER — Other Ambulatory Visit: Payer: Self-pay | Admitting: Pharmacy Technician

## 2023-12-26 NOTE — Progress Notes (Signed)
 Specialty Pharmacy Refill Coordination Note  Kristy [name] is a 68 y.o. female contacted today regarding refills of specialty medication(s) Ibrutinib  (IMBRUVICA )   Patient requested Delivery   Delivery date: 12/28/23   Verified address: [address]  [place]   Medication will be filled on 12/27/23.

## 2023-12-27 ENCOUNTER — Inpatient Hospital Stay (HOSPITAL_BASED_OUTPATIENT_CLINIC_OR_DEPARTMENT_OTHER): Admitting: Hematology & Oncology

## 2023-12-27 ENCOUNTER — Encounter: Payer: Self-pay | Admitting: Hematology & Oncology

## 2023-12-27 ENCOUNTER — Inpatient Hospital Stay

## 2023-12-27 ENCOUNTER — Inpatient Hospital Stay: Attending: Hematology & Oncology

## 2023-12-27 VITALS — BP 104/60 | HR 71 | Temp 97.7°F | Resp 18

## 2023-12-27 VITALS — BP 139/59 | HR 76 | Temp 97.7°F | Resp 18 | Ht 60.0 in | Wt 100.0 lb

## 2023-12-27 DIAGNOSIS — D510 Vitamin B12 deficiency anemia due to intrinsic factor deficiency: Secondary | ICD-10-CM | POA: Insufficient documentation

## 2023-12-27 DIAGNOSIS — J0101 Acute recurrent maxillary sinusitis: Secondary | ICD-10-CM

## 2023-12-27 DIAGNOSIS — D801 Nonfamilial hypogammaglobulinemia: Secondary | ICD-10-CM | POA: Insufficient documentation

## 2023-12-27 DIAGNOSIS — C8800 Waldenstrom macroglobulinemia not having achieved remission: Secondary | ICD-10-CM

## 2023-12-27 LAB — CMP (CANCER CENTER ONLY)
ALT: 14 U/L (ref 0–44)
AST: 28 U/L (ref 15–41)
Albumin: 4.3 g/dL (ref 3.5–5.0)
Alkaline Phosphatase: 55 U/L (ref 38–126)
Anion gap: 13 (ref 5–15)
BUN: 13 mg/dL (ref 8–23)
CO2: 21 mmol/L — ABNORMAL LOW (ref 22–32)
Calcium: 8.6 mg/dL — ABNORMAL LOW (ref 8.9–10.3)
Chloride: 102 mmol/L (ref 98–111)
Creatinine: 0.68 mg/dL (ref 0.44–1.00)
GFR, Estimated: >60 mL/min (ref >60)
Glucose, Bld: 82 mg/dL (ref 70–99)
Potassium: 4.3 mmol/L (ref 3.5–5.1)
Sodium: 137 mmol/L (ref 135–145)
Total Bilirubin: 1.2 mg/dL (ref 0.0–1.2)
Total Protein: 6.4 g/dL — ABNORMAL LOW (ref 6.5–8.1)

## 2023-12-27 LAB — CBC WITH DIFFERENTIAL (CANCER CENTER ONLY)
Abs Immature Granulocytes: 0.05 K/uL (ref 0.00–0.07)
Basophils Absolute: 0 K/uL (ref 0.0–0.1)
Basophils Relative: 0 %
Eosinophils Absolute: 0.1 K/uL (ref 0.0–0.5)
Eosinophils Relative: 1 %
HCT: 37.9 % (ref 36.0–46.0)
Hemoglobin: 13.1 g/dL (ref 12.0–15.0)
Immature Granulocytes: 1 %
Lymphocytes Relative: 30 %
Lymphs Abs: 2.3 K/uL (ref 0.7–4.0)
MCH: 35.4 pg — ABNORMAL HIGH (ref 26.0–34.0)
MCHC: 34.6 g/dL (ref 30.0–36.0)
MCV: 102.4 fL — ABNORMAL HIGH (ref 80.0–100.0)
Monocytes Absolute: 0.5 K/uL (ref 0.1–1.0)
Monocytes Relative: 6 %
Neutro Abs: 4.7 K/uL (ref 1.7–7.7)
Neutrophils Relative %: 62 %
Platelet Count: 114 K/uL — ABNORMAL LOW (ref 150–400)
RBC: 3.7 MIL/uL — ABNORMAL LOW (ref 3.87–5.11)
RDW: 12.3 % (ref 11.5–15.5)
WBC Count: 7.6 K/uL (ref 4.0–10.5)
nRBC: 0 % (ref 0.0–0.2)

## 2023-12-27 LAB — VITAMIN B12: Vitamin B-12: 255 pg/mL (ref 180–914)

## 2023-12-27 MED ORDER — IMMUNE GLOBULIN (HUMAN) 20 GM/200ML IV SOLN
40.0000 g | Freq: Once | INTRAVENOUS | Status: AC
  Administered 2023-12-27: 40 g via INTRAVENOUS
  Filled 2023-12-27: qty 400

## 2023-12-27 MED ORDER — ACETAMINOPHEN 325 MG PO TABS: 650.0000 mg | ORAL_TABLET | Freq: Once | ORAL | Status: DC

## 2023-12-27 MED ORDER — DEXTROSE 5 % IV SOLN: INTRAVENOUS | Status: DC

## 2023-12-27 MED ORDER — DIPHENHYDRAMINE HCL 25 MG PO CAPS: 25.0000 mg | ORAL_CAPSULE | Freq: Once | ORAL | Status: DC

## 2023-12-27 NOTE — Patient Instructions (Signed)
 Immune Globulin  Injection What is this medication? IMMUNE GLOBULIN  (im MUNE GLOB yoo lin) treats many immune system conditions. It works by Designer, multimedia extra antibodies. Antibodies are proteins made by the immune system that help protect the body. This medicine may be used for other purposes; ask your health care provider or pharmacist if you have questions. COMMON BRAND NAME(S): ASCENIV, Baygam, BIVIGAM, Carimune, Carimune NF, cutaquig, Cuvitru, Flebogamma, Flebogamma DIF, GamaSTAN, GamaSTAN S/D, Gamimune N, Gammagard, Gammagard S/D, Gammaked, Gammaplex, Gammar-P IV, Gamunex, Gamunex-C, Hizentra, Iveegam, Iveegam EN, Octagam, Panglobulin, Panglobulin NF, panzyga, Polygam S/D, Privigen , Sandoglobulin, Venoglobulin-S, Vigam, Vivaglobulin, Xembify What should I tell my care team before I take this medication? They need to know if you have any of these conditions: Blood clotting disorder Condition where you have excess fluid in your body, such as heart failure or edema Dehydration Diabetes Have had blood clots Heart disease Immune system conditions Kidney disease Low levels of IgA Recent or upcoming vaccine An unusual or allergic reaction to immune globulin , other medications, foods, dyes, or preservatives Pregnant or trying to get pregnant Breastfeeding How should I use this medication? This medication is infused into a vein or under the skin. It may also be injected into a muscle. It is usually given by your care team in a hospital or clinic setting. It may also be given at home. If you get this medication at home, you will be taught how to prepare and give it. Take it as directed on the prescription label. Keep taking it unless your care team tells you to stop. It is important that you put your used needles and syringes in a special sharps container. Do not put them in a trash can. If you do not have a sharps container, call your pharmacist or care team to get one. Talk to your care team  about the use of this medication in children. While it may be given to children for selected conditions, precautions do apply. Overdosage: If you think you have taken too much of this medicine contact a poison control center or emergency room at once. NOTE: This medicine is only for you. Do not share this medicine with others. What if I miss a dose? If you get this medication at the hospital or clinic: It is important not to miss your dose. Call your care team if you are unable to keep an appointment. If you give yourself this medication at home: If you miss a dose, take it as soon as you can. Then continue your normal schedule. If it is almost time for your next dose, take only that dose. Do not take double or extra doses. Call your care team with questions. What may interact with this medication? Live virus vaccines This list may not describe all possible interactions. Give your health care provider a list of all the medicines, herbs, non-prescription drugs, or dietary supplements you use. Also tell them if you smoke, drink alcohol , or use illegal drugs. Some items may interact with your medicine. What should I watch for while using this medication? Your condition will be monitored carefully while you are receiving this medication. Tell your care team if your symptoms do not start to get better or if they get worse. You may need blood work done while you are taking this medication. This medication increases the risk of blood clots. People with heart, blood vessel, or blood clotting conditions are more likely to develop a blood clot. Other risk factors include advanced age, estrogen use, tobacco  use, lack of movement, and being overweight. This medication can decrease the response to a vaccine. If you need to get vaccinated, tell your care team if you have received this medication within the last year. Extra booster doses may be needed. Talk to your care team to see if a different vaccination schedule  is needed. This medication is made from donated human blood. There is a small risk it may contain bacteria or viruses, such as hepatitis or HIV. All products are processed to kill most bacteria and viruses. Talk to your care team if you have questions about the risk of infection. If you have diabetes, talk to your care team about which device you should use to check your blood sugar. This medication may cause some devices to report falsely high blood sugar levels. This may cause you to react by not treating a low blood sugar level or by giving an insulin  dose that was not needed. This can cause severe low blood sugar levels. What side effects may I notice from receiving this medication? Side effects that you should report to your care team as soon as possible: Allergic reactions--skin rash, itching, hives, swelling of the face, lips, tongue, or throat Blood clot--pain, swelling, or warmth in the leg, shortness of breath, chest pain Fever, neck pain or stiffness, sensitivity to light, headache, nausea, vomiting, confusion, which may be signs of meningitis Hemolytic anemia--unusual weakness or fatigue, dizziness, headache, trouble breathing, dark urine, yellowing skin or eyes Kidney injury--decrease in the amount of urine, swelling of the ankles, hands, or feet Low sodium level--muscle weakness, fatigue, dizziness, headache, confusion Shortness of breath or trouble breathing, cough, unusual weakness or fatigue, blue skin or lips Side effects that usually do not require medical attention (report these to your care team if they continue or are bothersome): Chills Diarrhea Fever Headache Nausea This list may not describe all possible side effects. Call your doctor for medical advice about side effects. You may report side effects to FDA at 1-800-FDA-1088. Where should I keep my medication? Keep out of the reach of children and pets. You will be instructed on how to store this medication. Get rid of  any unused medication after the expiration date. To get rid of medications that are no longer needed or have expired: Take the medication to a medication take-back program. Check with your pharmacy or law enforcement to find a location. If you cannot return the medication, ask your pharmacist or care team how to get rid of this medication safely. NOTE: This sheet is a summary. It may not cover all possible information. If you have questions about this medicine, talk to your doctor, pharmacist, or health care provider.  2025 Elsevier/Gold Standard (2023-05-21 00:00:00)

## 2023-12-27 NOTE — Progress Notes (Signed)
 Hematology and Oncology Follow Up Visit  Kristy [name] [id number] 03-Nov-1955 68 y.o. 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  The big news is that she will be grandmother again.  Her daughter is expecting a grandson in December.  This is very exciting for Kristy [name].  She already has a 54-year-old granddaughter  Apparently, she says she needs to have a bronchoscopy.  Apparently, she has bronchiectasis.  She had a CT scan that was done on 09/06/2023.  This shows some mild bronchiectasis.  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  This is not commented on the CT scan.SABRA   Hopefully, she will be able to find a pulmonologist to help her.  Her immunoglobulin levels have been doing okay.  Her IgG level is 653 mg/dL.  Her IgM level is 57 mg/dL.  Her last monoclonal spike was not found in the blood.  Her last vitamin B12 level in July was 300 picograms per milliliter.  Her appetite is doing okay.  She has had no nausea or vomiting.  She has had no fever.  There has been no bleeding.  She has had no change in bowel or bladder habits.  She has had no rashes.  There has been no swollen lymph nodes.  Overall, her performance status is ECOG 1.  Medications:  Allergies as of 12/27/2023       Reactions   Grapefruit Extract Other (See Comments)   Can not take due to Imbruvica .   Ibuprofen Other (See Comments)   Can not take due to Imbruvica         Medication List        Accurate as of December 27, 2023  8:52 AM. If you have any questions, ask your nurse or doctor.          amLODipine  5 MG tablet Commonly known as: NORVASC  Take 5 mg by mouth daily.   BD Eclipse  Syringe/Needle 23G X 1 3 ML Misc Generic drug: SYRINGE-NEEDLE (DISP) 3 ML Inject 1,000 mcg into the muscle every 30 (thirty) days.   Cholecalciferol 25 MCG (1000 UT) tablet Take 1,000 Units by mouth daily.   ciclopirox  8 % solution Commonly known as: PENLAC  Apply topically at bedtime. Apply over nail and surrounding skin. Apply daily over previous coat. After seven (7) days, may remove with alcohol and continue cycle.   cyanocobalamin  1000 MCG/ML injection Commonly known as: VITAMIN B12 Inject 1 mL (1,000 mcg total) into the muscle every 30 (thirty) days.   estradiol  0.1 MG/GM vaginal cream Commonly known as: ESTRACE  Place 1 Applicatorful vaginally 3 (three) times a week.   Imbruvica  420 MG tablet Generic drug: ibrutinib  TAKE 1 TABLET BY MOUTH DAILY AFTER BREAKFAST.   Lifitegrast  5 % Soln Place 1 drop into both eyes 2 (two) times daily.   methylPREDNISolone  4 MG Tbpk tablet Commonly known as: MEDROL  DOSEPAK Take as directed   nitrofurantoin (macrocrystal-monohydrate) 100 MG capsule Commonly known as: MACROBID Take 100 mg by mouth daily as needed (For UTI).   triamcinolone  cream 0.5 % Commonly known as: KENALOG  Apply 1 Application topically 2 (two) times daily.        Allergies:  Allergies  Allergen Reactions  Grapefruit Extract Other (See Comments)    Can not take due to Imbruvica .   Ibuprofen Other (See Comments)    Can not take due to Imbruvica     Past Medical History, Surgical history, Social history, and Family History were reviewed and updated.  Review of Systems: Review of Systems  Constitutional: Negative.   HENT: Negative.    Eyes: Negative.   Respiratory: Negative.    Cardiovascular: Negative.   Gastrointestinal: Negative.   Genitourinary: Negative.   Musculoskeletal: Negative.   Skin: Negative.   Neurological: Negative.   Endo/Heme/Allergies: Negative.   Psychiatric/Behavioral:  The patient is not nervous/anxious.      Physical  Exam:  Temperature yes ma'am 97.7.   Pulse 76 .  Blood pressure 139/59.  Weight is 100 pounds.  .   Wt Readings from Last 3 Encounters:  12/27/23 100 lb (45.4 kg)  11/15/23 102 lb (46.3 kg)  10/04/23 102 lb 0.6 oz (46.3 kg)    Physical Exam Vitals reviewed.  HENT:     Head: Normocephalic and atraumatic.  Eyes:     Pupils: Pupils are equal, round, and reactive to light.  Cardiovascular:     Rate and Rhythm: Normal rate and regular rhythm.     Heart sounds: Normal heart sounds.  Pulmonary:     Effort: Pulmonary effort is normal.     Breath sounds: Normal breath sounds.  Abdominal:     General: Bowel sounds are normal.     Palpations: Abdomen is soft.  Musculoskeletal:        General: No tenderness or deformity. Normal range of motion.     Cervical back: Normal range of motion.  Lymphadenopathy:     Cervical: No cervical adenopathy.  Skin:    General: Skin is warm and dry.     Findings: No erythema or rash.  Neurological:     Mental Status: She is alert and oriented to person, place, and time.  Psychiatric:        Behavior: Behavior normal.        Thought Content: Thought content normal.        Judgment: Judgment normal.      Lab Results  Component Value Date   WBC 7.6 12/27/2023   HGB 13.1 12/27/2023   HCT 37.9 12/27/2023   MCV 102.4 (H) 12/27/2023   PLT 114 (L) 12/27/2023   No results found for: FERRITIN, IRON, TIBC, UIBC, IRONPCTSAT Lab Results  Component Value Date   RETICCTPCT 2.9 (H) 10/30/2014   RBC 3.70 (L) 12/27/2023   RETICCTABS 98.3 10/30/2014   Lab Results  Component Value Date   KPAFRELGTCHN 4.2 11/15/2023   LAMBDASER 2.5 (L) 11/15/2023   KAPLAMBRATIO 1.68 (H) 11/15/2023   Lab Results  Component Value Date   IGGSERUM 653 11/15/2023   IGA 10 (L) 11/15/2023   IGMSERUM 57 11/15/2023   Lab Results  Component Value Date   TOTALPROTELP 6.3 11/15/2023   ALBUMINELP 3.9 10/04/2023   A1GS 0.3 10/04/2023   A2GS 0.6 10/04/2023    BETS 0.8 10/04/2023   BETA2SER 0.2 02/15/2015   GAMS 0.5 10/04/2023   MSPIKE Not Observed 10/04/2023   SPEI Comment 01/04/2023     Chemistry      Component Value Date/Time   NA 139 11/15/2023 0805   NA 147 (H) 03/19/2017 0849   NA 139 03/16/2016 1337   K 3.8 11/15/2023 0805   K 4.5 03/19/2017 0849   K 3.8 03/16/2016 1337   CL 102 11/15/2023 0805  CL 108 03/19/2017 0849   CO2 24 11/15/2023 0805   CO2 31 03/19/2017 0849   CO2 27 03/16/2016 1337   BUN 9 11/15/2023 0805   BUN 11 03/19/2017 0849   BUN 5.2 (L) 03/16/2016 1337   CREATININE 0.84 11/15/2023 0805   CREATININE 1.0 03/19/2017 0849   CREATININE 0.7 03/16/2016 1337      Component Value Date/Time   CALCIUM 9.3 11/15/2023 0805   CALCIUM 9.6 03/19/2017 0849   CALCIUM 8.8 03/16/2016 1337   ALKPHOS 60 11/15/2023 0805   ALKPHOS 53 03/19/2017 0849   ALKPHOS 62 03/16/2016 1337   AST 29 11/15/2023 0805   AST 24 03/16/2016 1337   ALT 24 11/15/2023 0805   ALT 23 03/19/2017 0849   ALT 19 03/16/2016 1337   BILITOT 1.2 11/15/2023 0805   BILITOT 0.57 03/16/2016 1337       Impression and Plan: Ms. Leisey is a very pleasant 68 yo caucasian female with Waldenstrom's with acquired hypogammaglobulinemia and recurrent sinusitis.  Her Waldenstrom's continues to be under excellent control.  I really do not see that this will be a problem with her.  She is doing well on the Imbruvica .   I think she is doing her vitamin B12 at home.  I am so happy that she will be having a  grandson.  Hopefully, everybody will be healthy.  We will go ahead with the IVIG today.  She will get her vitamin B-12 today.  We will get her back in 6 weeks.    Maude JONELLE Crease, MD 10/9/20258:52 AM

## 2023-12-28 LAB — KAPPA/LAMBDA LIGHT CHAINS
Kappa free light chain: 4.2 mg/L (ref 3.3–19.4)
Kappa, lambda light chain ratio: 1.75 — ABNORMAL HIGH (ref 0.26–1.65)
Lambda free light chains: 2.4 mg/L — ABNORMAL LOW (ref 5.7–26.3)

## 2023-12-29 LAB — IGG, IGA, IGM
IgA: 9 mg/dL — ABNORMAL LOW (ref 87–352)
IgG (Immunoglobin G), Serum: 681 mg/dL (ref 586–1602)
IgM (Immunoglobulin M), Srm: 55 mg/dL (ref 26–217)

## 2023-12-31 LAB — PROTEIN ELECTROPHORESIS, SERUM, WITH REFLEX
A/G Ratio: 1.5 (ref 0.7–1.7)
Albumin ELP: 3.7 g/dL (ref 2.9–4.4)
Alpha-1-Globulin: 0.2 g/dL (ref 0.0–0.4)
Alpha-2-Globulin: 0.6 g/dL (ref 0.4–1.0)
Beta Globulin: 0.9 g/dL (ref 0.7–1.3)
Gamma Globulin: 0.7 g/dL (ref 0.4–1.8)
Globulin, Total: 2.4 g/dL (ref 2.2–3.9)
Total Protein ELP: 6.1 g/dL (ref 6.0–8.5)

## 2024-01-04 ENCOUNTER — Encounter: Payer: Self-pay | Admitting: Podiatry

## 2024-01-04 ENCOUNTER — Ambulatory Visit: Admitting: Podiatry

## 2024-01-04 DIAGNOSIS — M722 Plantar fascial fibromatosis: Secondary | ICD-10-CM | POA: Diagnosis not present

## 2024-01-04 DIAGNOSIS — B351 Tinea unguium: Secondary | ICD-10-CM | POA: Diagnosis not present

## 2024-01-04 NOTE — Progress Notes (Signed)
  Subjective:  Patient ID: Kristy [name], female    DOB: 10/03/1955,   MRN: [id number]  Chief Complaint  Patient presents with   Plantar Fasciitis    It seems to be doing pretty good.     Nail Problem    My dermatologist, Dr. Shona, recommended some drops to put on my nail.  He said it works faster.  It's a combination of two meds together.    68 y.o. female presents for follow-up of plantar fasciiits. Relates it has been doing well. Getting better. She did not take the medrol  dose pack and doing physical therapy instead. She was told pains coming from her back.   She has also been using different trops on nails to combine and help work faster. She has not been using the penlac .   . Denies any other pedal complaints. Denies n/v/f/c.   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 CFT <3 seconds. Normal hair growth on digits. No edema.  Skin. No lacerations or abrasions bilateral feet. Bilateral hallux nails thickened and dystrophic.  Musculoskeletal: MMT 5/5 bilateral lower extremities in DF, PF, Inversion and Eversion. Deceased ROM in DF of ankle joint. Tender to the medial calcaneal tubercle bilaterally . No pain with achilles, PT or arch. No pain with calcaneal squeeze.  Neurological: Sensation intact to light touch.   Assessment:   1. Plantar fasciitis, right   2. Plantar fasciitis of left foot   3. Onychomycosis       Plan:  Patient was evaluated and treated and all questions answered. Discussed plantar fasciitis with patient.  X-rays reviewed and discussed with patient. No acute fractures or dislocations noted. Mild spurring noted at inferior calcaneus.  Discussed treatment options including, ice, NSAIDS, supportive shoes, bracing, and stretching.   Continue stretching and brace.  Continue PT for back.  Continue treatmetns per dermatologist  Follow-up as needed.   Asberry Failing, DPM

## 2024-01-17 ENCOUNTER — Other Ambulatory Visit (HOSPITAL_COMMUNITY): Payer: Self-pay

## 2024-01-21 ENCOUNTER — Other Ambulatory Visit: Payer: Self-pay

## 2024-01-21 ENCOUNTER — Other Ambulatory Visit (HOSPITAL_COMMUNITY): Payer: Self-pay

## 2024-01-21 NOTE — Progress Notes (Signed)
 Specialty Pharmacy Refill Coordination Note  Spoke with Briannon Boggio is a 68 y.o. female contacted today regarding refills of specialty medication(s) Ibrutinib  (IMBRUVICA )  Doses on hand: 11  Patient requested: Delivery   Delivery date: 01/25/24   Verified address: [address] [place] [address]  Medication will be filled on 01/24/24 .

## 2024-01-23 ENCOUNTER — Other Ambulatory Visit: Payer: Self-pay

## 2024-01-24 ENCOUNTER — Other Ambulatory Visit: Payer: Self-pay

## 2024-02-11 ENCOUNTER — Inpatient Hospital Stay: Attending: Hematology & Oncology

## 2024-02-11 ENCOUNTER — Inpatient Hospital Stay

## 2024-02-11 ENCOUNTER — Inpatient Hospital Stay: Admitting: Family

## 2024-02-11 VITALS — BP 134/70 | HR 83

## 2024-02-11 VITALS — BP 130/53 | HR 74 | Temp 97.8°F | Resp 16 | Wt 98.0 lb

## 2024-02-11 DIAGNOSIS — C8800 Waldenstrom macroglobulinemia not having achieved remission: Secondary | ICD-10-CM | POA: Diagnosis not present

## 2024-02-11 DIAGNOSIS — J0101 Acute recurrent maxillary sinusitis: Secondary | ICD-10-CM

## 2024-02-11 DIAGNOSIS — D801 Nonfamilial hypogammaglobulinemia: Secondary | ICD-10-CM

## 2024-02-11 DIAGNOSIS — R0982 Postnasal drip: Secondary | ICD-10-CM | POA: Insufficient documentation

## 2024-02-11 DIAGNOSIS — D510 Vitamin B12 deficiency anemia due to intrinsic factor deficiency: Secondary | ICD-10-CM

## 2024-02-11 LAB — COMPREHENSIVE METABOLIC PANEL WITH GFR
ALT: 16 U/L (ref 0–44)
AST: 29 U/L (ref 15–41)
Albumin: 4.7 g/dL (ref 3.5–5.0)
Alkaline Phosphatase: 65 U/L (ref 38–126)
Anion gap: 13 (ref 5–15)
BUN: 7 mg/dL — ABNORMAL LOW (ref 8–23)
CO2: 24 mmol/L (ref 22–32)
Calcium: 9.2 mg/dL (ref 8.9–10.3)
Chloride: 103 mmol/L (ref 98–111)
Creatinine, Ser: 0.76 mg/dL (ref 0.44–1.00)
GFR, Estimated: >60 mL/min (ref >60)
Glucose, Bld: 86 mg/dL (ref 70–99)
Potassium: 4.1 mmol/L (ref 3.5–5.1)
Sodium: 140 mmol/L (ref 135–145)
Total Bilirubin: 1.3 mg/dL — ABNORMAL HIGH (ref 0.0–1.2)
Total Protein: 6.5 g/dL (ref 6.5–8.1)

## 2024-02-11 LAB — CBC WITH DIFFERENTIAL (CANCER CENTER ONLY)
Abs Immature Granulocytes: 0.04 K/uL (ref 0.00–0.07)
Basophils Absolute: 0 K/uL (ref 0.0–0.1)
Basophils Relative: 1 %
Eosinophils Absolute: 0.1 K/uL (ref 0.0–0.5)
Eosinophils Relative: 1 %
HCT: 42 % (ref 36.0–46.0)
Hemoglobin: 14.3 g/dL (ref 12.0–15.0)
Immature Granulocytes: 1 %
Lymphocytes Relative: 22 %
Lymphs Abs: 1.7 K/uL (ref 0.7–4.0)
MCH: 35.7 pg — ABNORMAL HIGH (ref 26.0–34.0)
MCHC: 34 g/dL (ref 30.0–36.0)
MCV: 104.7 fL — ABNORMAL HIGH (ref 80.0–100.0)
Monocytes Absolute: 0.5 K/uL (ref 0.1–1.0)
Monocytes Relative: 6 %
Neutro Abs: 5.4 K/uL (ref 1.7–7.7)
Neutrophils Relative %: 69 %
Platelet Count: 105 K/uL — ABNORMAL LOW (ref 150–400)
RBC: 4.01 MIL/uL (ref 3.87–5.11)
RDW: 12.9 % (ref 11.5–15.5)
WBC Count: 7.8 K/uL (ref 4.0–10.5)
nRBC: 0 % (ref 0.0–0.2)

## 2024-02-11 LAB — VITAMIN B12: Vitamin B-12: 1815 pg/mL — ABNORMAL HIGH (ref 180–914)

## 2024-02-11 MED ORDER — IMMUNE GLOBULIN (HUMAN) 20 GM/200ML IV SOLN
40.0000 g | Freq: Once | INTRAVENOUS | Status: AC
  Administered 2024-02-11: 40 g via INTRAVENOUS
  Filled 2024-02-11: qty 400

## 2024-02-11 MED ORDER — DIPHENHYDRAMINE HCL 25 MG PO CAPS: 25.0000 mg | ORAL_CAPSULE | Freq: Once | ORAL | Status: DC

## 2024-02-11 MED ORDER — ACETAMINOPHEN 325 MG PO TABS: 650.0000 mg | ORAL_TABLET | Freq: Once | ORAL | Status: DC

## 2024-02-11 MED ORDER — DEXTROSE 5 % IV SOLN: INTRAVENOUS | Status: DC

## 2024-02-11 NOTE — Progress Notes (Signed)
 Hematology and Oncology Follow Up Visit  Kristy [name] [id number] February 09, 1956 68 y.o. 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 She is doing well and denies any recent issue with infection.  She has post nasal drip with chronic congestion. Cough with phlegm at times.  She has an appointment for EGD coming up to assess for reflux.  Rare nausea. No fever, chills, vomiting, rash, dizziness, SOB, chest pain, palpitations, abdominal pain or changes in bowel or bladder habits.  IgG level at last visit was stable at 681 and IgM 55. No M-spike.  No swelling or tenderness in her extremities at this time.  No falls or syncope reported.  Appetite and hydration are good. She is vegan. Weight stable at 98 lbs.   ECOG Performance Status: 1 - Symptomatic but completely ambulatory  Medications:  Allergies as of 02/11/2024       Reactions   Grapefruit Extract Other (See Comments)   Can not take due to Imbruvica .   Ibuprofen Other (See Comments)   Can not take due to Imbruvica         Medication List        Accurate as of February 11, 2024  9:07 AM. If you have any questions, ask your nurse or doctor.          STOP taking these medications    methylPREDNISolone  4 MG Tbpk tablet Commonly known as: MEDROL  DOSEPAK Stopped by: Lauraine Pepper       TAKE these medications    amLODipine  5 MG tablet Commonly known as: NORVASC  Take 5 mg by mouth daily.   BD Eclipse Syringe/Needle 23G X 1 3 ML Misc Generic drug: SYRINGE-NEEDLE (DISP) 3 ML Inject 1,000 mcg into the muscle every 30 (thirty) days.   Cholecalciferol 25 MCG (1000 UT) tablet Take 1,000 Units by mouth  daily.   ciclopirox  8 % solution Commonly known as: PENLAC  Apply topically at bedtime. Apply over nail and surrounding skin. Apply daily over previous coat. After seven (7) days, may remove with alcohol and continue cycle.   cyanocobalamin  1000 MCG/ML injection Commonly known as: VITAMIN B12 Inject 1 mL (1,000 mcg total) into the muscle every 30 (thirty) days.   estradiol  0.1 MG/GM vaginal cream Commonly known as: ESTRACE  Place 1 Applicatorful vaginally 3 (three) times a week.   Imbruvica  420 MG tablet Generic drug: ibrutinib  TAKE 1 TABLET BY MOUTH DAILY AFTER BREAKFAST.   Lifitegrast  5 % Soln Place 1 drop into both eyes 2 (two) times daily.   nitrofurantoin (macrocrystal-monohydrate) 100 MG capsule Commonly known as: MACROBID Take 100 mg by mouth daily as needed (For UTI).   triamcinolone  cream 0.5 % Commonly known as: KENALOG  Apply 1 Application topically 2 (two) times daily.        Allergies:  Allergies  Allergen Reactions   Grapefruit Extract Other (See Comments)    Can not take due to Imbruvica .   Ibuprofen Other (See Comments)    Can not take due to Imbruvica     Past Medical History, Surgical history, Social history, and Family History were reviewed and updated.  Review of Systems: All other 10 point review of systems is negative.  Physical Exam:  weight is 98 lb (44.5 kg). Her temperature is 97.8 F (36.6 C). Her blood pressure is 130/53 (abnormal) and her pulse is 74. Her respiration is 16 and oxygen saturation is 98%.   Wt Readings from Last 3 Encounters:  02/11/24 98 lb (44.5 kg)  12/27/23 100 lb (45.4 kg)  11/15/23 102 lb (46.3 kg)    Ocular: Sclerae unicteric, pupils equal, round and reactive to light Ear-nose-throat: Oropharynx clear, dentition fair Lymphatic: No cervical or supraclavicular adenopathy Lungs no rales or rhonchi, good excursion bilaterally Heart regular rate and rhythm, no murmur appreciated Abd soft, nontender, positive bowel  sounds MSK no focal spinal tenderness, no joint edema Neuro: non-focal, well-oriented, appropriate affect Breasts: Deferred   Lab Results  Component Value Date   WBC 7.6 12/27/2023   HGB 13.1 12/27/2023   HCT 37.9 12/27/2023   MCV 102.4 (H) 12/27/2023   PLT 114 (L) 12/27/2023   No results found for: FERRITIN, IRON, TIBC, UIBC, IRONPCTSAT Lab Results  Component Value Date   RETICCTPCT 2.9 (H) 10/30/2014   RBC 3.70 (L) 12/27/2023   RETICCTABS 98.3 10/30/2014   Lab Results  Component Value Date   KPAFRELGTCHN 4.2 12/27/2023   LAMBDASER 2.4 (L) 12/27/2023   KAPLAMBRATIO 1.75 (H) 12/27/2023   Lab Results  Component Value Date   IGGSERUM 681 12/27/2023   IGA 9 (L) 12/27/2023   IGMSERUM 55 12/27/2023   Lab Results  Component Value Date   TOTALPROTELP 6.1 12/27/2023   ALBUMINELP 3.7 12/27/2023   A1GS 0.2 12/27/2023   A2GS 0.6 12/27/2023   BETS 0.9 12/27/2023   BETA2SER 0.2 02/15/2015   GAMS 0.7 12/27/2023   MSPIKE Not Observed 12/27/2023   SPEI Comment 01/04/2023     Chemistry      Component Value Date/Time   NA 137 12/27/2023 0824   NA 147 (H) 03/19/2017 0849   NA 139 03/16/2016 1337   K 4.3 12/27/2023 0824   K 4.5 03/19/2017 0849   K 3.8 03/16/2016 1337   CL 102 12/27/2023 0824   CL 108 03/19/2017 0849   CO2 21 (L) 12/27/2023 0824   CO2 31 03/19/2017 0849   CO2 27 03/16/2016 1337   BUN 13 12/27/2023 0824   BUN 11 03/19/2017 0849   BUN 5.2 (L) 03/16/2016 1337   CREATININE 0.68 12/27/2023 0824   CREATININE 1.0 03/19/2017 0849   CREATININE 0.7 03/16/2016 1337      Component Value Date/Time   CALCIUM 8.6 (L) 12/27/2023 0824   CALCIUM 9.6 03/19/2017 0849   CALCIUM 8.8 03/16/2016 1337   ALKPHOS 55 12/27/2023 0824   ALKPHOS 53 03/19/2017 0849   ALKPHOS 62 03/16/2016 1337   AST 28 12/27/2023 0824   AST 24 03/16/2016 1337   ALT 14 12/27/2023 0824   ALT 23 03/19/2017 0849   ALT 19 03/16/2016 1337   BILITOT 1.2 12/27/2023 0824   BILITOT 0.57  03/16/2016 1337       Impression and Plan: Kristy [name] is a very pleasant 68 yo caucasian female with Waldenstrom's with acquired hypogammaglobulinemia and recurrent sinusitis. Her Waldenstrom's continues to be under excellent control.  She is doing well on the Imbruvica .  We will proceed with IVIG today as planned.  B 12 given at home now by her husband.  Follow-up in 6 weeks.   Lauraine Pepper, NP 11/24/20259:07 AM

## 2024-02-11 NOTE — Patient Instructions (Signed)
 Immune Globulin  Injection What is this medication? IMMUNE GLOBULIN  (im MUNE GLOB yoo lin) treats many immune system conditions. It works by Designer, multimedia extra antibodies. Antibodies are proteins made by the immune system that help protect the body. This medicine may be used for other purposes; ask your health care provider or pharmacist if you have questions. COMMON BRAND NAME(S): ASCENIV, Baygam, BIVIGAM, Carimune, Carimune NF, cutaquig, Cuvitru, Flebogamma, Flebogamma DIF, GamaSTAN, GamaSTAN S/D, Gamimune N, Gammagard, Gammagard S/D, Gammaked, Gammaplex, Gammar-P IV, Gamunex, Gamunex-C, Hizentra, Iveegam, Iveegam EN, Octagam, Panglobulin, Panglobulin NF, panzyga, Polygam S/D, Privigen , Sandoglobulin, Venoglobulin-S, Vigam, Vivaglobulin, Xembify What should I tell my care team before I take this medication? They need to know if you have any of these conditions: Blood clotting disorder Condition where you have excess fluid in your body, such as heart failure or edema Dehydration Diabetes Have had blood clots Heart disease Immune system conditions Kidney disease Low levels of IgA Recent or upcoming vaccine An unusual or allergic reaction to immune globulin , other medications, foods, dyes, or preservatives Pregnant or trying to get pregnant Breastfeeding How should I use this medication? This medication is infused into a vein or under the skin. It may also be injected into a muscle. It is usually given by your care team in a hospital or clinic setting. It may also be given at home. If you get this medication at home, you will be taught how to prepare and give it. Take it as directed on the prescription label. Keep taking it unless your care team tells you to stop. It is important that you put your used needles and syringes in a special sharps container. Do not put them in a trash can. If you do not have a sharps container, call your pharmacist or care team to get one. Talk to your care team  about the use of this medication in children. While it may be given to children for selected conditions, precautions do apply. Overdosage: If you think you have taken too much of this medicine contact a poison control center or emergency room at once. NOTE: This medicine is only for you. Do not share this medicine with others. What if I miss a dose? If you get this medication at the hospital or clinic: It is important not to miss your dose. Call your care team if you are unable to keep an appointment. If you give yourself this medication at home: If you miss a dose, take it as soon as you can. Then continue your normal schedule. If it is almost time for your next dose, take only that dose. Do not take double or extra doses. Call your care team with questions. What may interact with this medication? Live virus vaccines This list may not describe all possible interactions. Give your health care provider a list of all the medicines, herbs, non-prescription drugs, or dietary supplements you use. Also tell them if you smoke, drink alcohol , or use illegal drugs. Some items may interact with your medicine. What should I watch for while using this medication? Your condition will be monitored carefully while you are receiving this medication. Tell your care team if your symptoms do not start to get better or if they get worse. You may need blood work done while you are taking this medication. This medication increases the risk of blood clots. People with heart, blood vessel, or blood clotting conditions are more likely to develop a blood clot. Other risk factors include advanced age, estrogen use, tobacco  use, lack of movement, and being overweight. This medication can decrease the response to a vaccine. If you need to get vaccinated, tell your care team if you have received this medication within the last year. Extra booster doses may be needed. Talk to your care team to see if a different vaccination schedule  is needed. This medication is made from donated human blood. There is a small risk it may contain bacteria or viruses, such as hepatitis or HIV. All products are processed to kill most bacteria and viruses. Talk to your care team if you have questions about the risk of infection. If you have diabetes, talk to your care team about which device you should use to check your blood sugar. This medication may cause some devices to report falsely high blood sugar levels. This may cause you to react by not treating a low blood sugar level or by giving an insulin  dose that was not needed. This can cause severe low blood sugar levels. What side effects may I notice from receiving this medication? Side effects that you should report to your care team as soon as possible: Allergic reactions--skin rash, itching, hives, swelling of the face, lips, tongue, or throat Blood clot--pain, swelling, or warmth in the leg, shortness of breath, chest pain Fever, neck pain or stiffness, sensitivity to light, headache, nausea, vomiting, confusion, which may be signs of meningitis Hemolytic anemia--unusual weakness or fatigue, dizziness, headache, trouble breathing, dark urine, yellowing skin or eyes Kidney injury--decrease in the amount of urine, swelling of the ankles, hands, or feet Low sodium level--muscle weakness, fatigue, dizziness, headache, confusion Shortness of breath or trouble breathing, cough, unusual weakness or fatigue, blue skin or lips Side effects that usually do not require medical attention (report these to your care team if they continue or are bothersome): Chills Diarrhea Fever Headache Nausea This list may not describe all possible side effects. Call your doctor for medical advice about side effects. You may report side effects to FDA at 1-800-FDA-1088. Where should I keep my medication? Keep out of the reach of children and pets. You will be instructed on how to store this medication. Get rid of  any unused medication after the expiration date. To get rid of medications that are no longer needed or have expired: Take the medication to a medication take-back program. Check with your pharmacy or law enforcement to find a location. If you cannot return the medication, ask your pharmacist or care team how to get rid of this medication safely. NOTE: This sheet is a summary. It may not cover all possible information. If you have questions about this medicine, talk to your doctor, pharmacist, or health care provider.  2025 Elsevier/Gold Standard (2023-05-21 00:00:00)

## 2024-02-12 LAB — KAPPA/LAMBDA LIGHT CHAINS
Kappa free light chain: 7.7 mg/L (ref 3.3–19.4)
Kappa, lambda light chain ratio: 3.08 — ABNORMAL HIGH (ref 0.26–1.65)
Lambda free light chains: 2.5 mg/L — ABNORMAL LOW (ref 5.7–26.3)

## 2024-02-13 ENCOUNTER — Other Ambulatory Visit (HOSPITAL_COMMUNITY): Payer: Self-pay

## 2024-02-13 LAB — IGG, IGA, IGM
IgA: 10 mg/dL — ABNORMAL LOW (ref 87–352)
IgG (Immunoglobin G), Serum: 686 mg/dL (ref 586–1602)
IgM (Immunoglobulin M), Srm: 76 mg/dL (ref 26–217)

## 2024-02-19 ENCOUNTER — Other Ambulatory Visit: Payer: Self-pay

## 2024-02-19 ENCOUNTER — Other Ambulatory Visit: Payer: Self-pay | Admitting: Hematology & Oncology

## 2024-02-19 ENCOUNTER — Other Ambulatory Visit (HOSPITAL_COMMUNITY): Payer: Self-pay

## 2024-02-19 DIAGNOSIS — J0101 Acute recurrent maxillary sinusitis: Secondary | ICD-10-CM

## 2024-02-19 DIAGNOSIS — C8800 Waldenstrom macroglobulinemia not having achieved remission: Secondary | ICD-10-CM

## 2024-02-19 DIAGNOSIS — D801 Nonfamilial hypogammaglobulinemia: Secondary | ICD-10-CM

## 2024-02-19 MED ORDER — CYANOCOBALAMIN 1000 MCG/ML IJ SOLN
1000.0000 ug | INTRAMUSCULAR | 1 refills | Status: AC
  Filled 2024-02-19: qty 3, 90d supply, fill #0

## 2024-02-19 NOTE — Progress Notes (Signed)
 Specialty Pharmacy Refill Coordination Note  Kristy [name] is a 68 y.o. female contacted today regarding refills of specialty medication(s) Ibrutinib  (IMBRUVICA )   Patient requested Delivery   Delivery date: 02/26/24   Verified address: [address] [place] [address]   Medication will be filled on: 02/25/24

## 2024-02-25 ENCOUNTER — Other Ambulatory Visit: Payer: Self-pay

## 2024-03-10 ENCOUNTER — Telehealth: Payer: Self-pay

## 2024-03-10 ENCOUNTER — Ambulatory Visit: Admitting: Gastroenterology

## 2024-03-10 ENCOUNTER — Encounter: Payer: Self-pay | Admitting: Gastroenterology

## 2024-03-10 VITALS — BP 124/70 | HR 84 | Ht 60.0 in | Wt 101.0 lb

## 2024-03-10 DIAGNOSIS — D801 Nonfamilial hypogammaglobulinemia: Secondary | ICD-10-CM

## 2024-03-10 DIAGNOSIS — D510 Vitamin B12 deficiency anemia due to intrinsic factor deficiency: Secondary | ICD-10-CM | POA: Diagnosis not present

## 2024-03-10 DIAGNOSIS — R1319 Other dysphagia: Secondary | ICD-10-CM

## 2024-03-10 DIAGNOSIS — R058 Other specified cough: Secondary | ICD-10-CM | POA: Diagnosis not present

## 2024-03-10 DIAGNOSIS — C8800 Waldenstrom macroglobulinemia not having achieved remission: Secondary | ICD-10-CM | POA: Diagnosis not present

## 2024-03-10 NOTE — Progress Notes (Signed)
 "  Chief Complaint:dysphagia Primary GI Doctor: Dr. Charlanne   HPI:  [name] with past medical history of Waldenstrom macroglobulinemia, Acquired hypogammaglobulinemia , and pernicious anemia, who was self referred to me for a evaluation of dysphagia .    01/2024 followed by oncology. She is receiving IVIG infusions.She has an appointment for EGD coming up to assess for reflux.   Interval History Patient presents for evaluation of esophageal dysphagia with solids only such as rice.  Accompanied by her son who is a FNP. Patient reports a lot of mucus in her throat and has concerns for silent reflux from being on ibrutinib . She has endorsed a chronic productive cough with difficulty expectorating mucus.  She has been seen by pulmonology/allergist with workup negative. They completed allergy testing. 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 They suspected her persistent cough is secondary to GI disease. Her son has tried putting her on antiacid which patient states actually made her feel worse. Weight stable. Appetite good.  Patient denies pyrosis.   Patient denies altered bowel habits, abdominal pain, or rectal bleeding.  Denies alcohol use. Nonsmoker.   Never had EGD/colon. Cologuard 2023 negative.  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     Cholecalciferol 25 MCG (1000 UT) tablet  Take 1,000 Units by mouth daily.     ciclopirox  (PENLAC ) 8 % solution Apply topically at bedtime. Apply over nail and surrounding skin. Apply daily over previous coat. After seven (7) days, Kristy [name] remove with alcohol and continue cycle. 6.6 mL 0   cyanocobalamin  (VITAMIN B12) 1000 MCG/ML injection Inject 1 mL (1,000 mcg total) into the muscle every 30 (thirty) days. 6 mL 1   cyanocobalamin  (VITAMIN B12) 1000 MCG/ML injection Inject 1 mL (1,000 mcg total) into the muscle every 30 (thirty) days. 6 mL 1   estradiol  (ESTRACE ) 0.1 MG/GM vaginal cream Place 1 Applicatorful vaginally 3 (three) times a week.     ibrutinib  (IMBRUVICA ) 420 MG tablet TAKE 1 TABLET BY MOUTH DAILY AFTER BREAKFAST. 28 tablet 12   Lifitegrast 5 % SOLN Place 1 drop into both eyes 2 (two) times daily.     nitrofurantoin, macrocrystal-monohydrate, (MACROBID) 100 MG capsule Take 100 mg by mouth daily as needed (For UTI).     SYRINGE-NEEDLE, DISP, 3 ML (BD ECLIPSE SYRINGE/NEEDLE) 23G X 1 3 ML MISC Inject 1,000 mcg into the muscle every 30 (thirty) days. 50 each 1   triamcinolone cream (KENALOG) 0.5 % Apply 1 Application topically 2 (two) times daily.     No current facility-administered medications for this visit.    Allergies as of 03/10/2024 - Review Complete 03/10/2024  Allergen Reaction Noted   Grapefruit extract Other (See Comments) 01/07/2022   Ibuprofen Other (See Comments) 11/23/2020    Family History  Problem Relation Age of Onset   Diabetes Father     Review of Systems:  Constitutional: No weight loss, fever, chills, weakness or fatigue HEENT: Eyes: No change in vision               Ears, Nose, Throat:  No change in hearing or congestion Skin: No rash or itching Cardiovascular: No chest pain, chest pressure or palpitations   Respiratory: No SOB or cough Gastrointestinal: See HPI and otherwise negative Genitourinary: No dysuria or change in urinary frequency Neurological: No headache, dizziness or  syncope Musculoskeletal: No new muscle or joint pain Hematologic: No bleeding or bruising Psychiatric: No history of depression or anxiety    Physical Exam:  Vital signs: BP 124/70   Pulse 84   Ht 5' (1.524 m)   Wt 101 lb (45.8 kg)   BMI 19.73 kg/m   Constitutional:   Pleasant  female appears to be in NAD, Well developed, Well nourished, alert and cooperative Eyes:   PEERL, EOMI. No icterus. Conjunctiva pink. Neck:  Supple Throat: Oral cavity and pharynx without inflammation, swelling or lesion.  Respiratory: Respirations even and unlabored. Lungs clear to auscultation bilaterally.   No wheezes, crackles, or rhonchi.  Cardiovascular: Normal S1, S2. Regular rate and rhythm. No peripheral edema, cyanosis or pallor.  Gastrointestinal:  Soft, nondistended, nontender. No rebound or guarding. Normal bowel sounds. No appreciable masses or hepatomegaly. Rectal:  Not performed.  Msk:  Symmetrical without gross deformities. Without edema, no deformity or joint abnormality.  Neurologic:  Alert and  oriented x4;  grossly normal neurologically.  Skin:   Dry and intact without significant lesions or rashes.  RELEVANT LABS AND IMAGING: CBC    Latest Ref Rng & Units 02/11/2024    8:36 AM 12/27/2023    8:24 AM 11/15/2023    8:05 AM  CBC  WBC 4.0 - 10.5 K/uL 7.8  7.6  7.7   Hemoglobin 12.0 - 15.0 g/dL 85.6  86.8  86.3   Hematocrit 36.0 - 46.0 % 42.0  37.9  40.2   Platelets 150 - 400 K/uL 105  114  92      CMP     Latest Ref Rng & Units 02/11/2024    8:37 AM 12/27/2023    8:24 AM 11/15/2023    8:05 AM  CMP  Glucose 70 - 99 mg/dL 86  82  83   BUN 8 - 23 mg/dL 7  13  9    Creatinine 0.44 - 1.00 mg/dL 9.23  9.31  9.15   Sodium 135 - 145 mmol/L 140  137  139   Potassium 3.5 - 5.1 mmol/L 4.1  4.3  3.8   Chloride 98 - 111 mmol/L 103  102  102   CO2 22 - 32 mmol/L 24  21  24    Calcium 8.9 - 10.3 mg/dL 9.2  8.6  9.3   Total Protein 6.5 - 8.1 g/dL 6.5  6.4  6.4   Total Bilirubin 0.0 - 1.2  mg/dL 1.3  1.2  1.2   Alkaline Phos 38 - 126 U/L 65  55  60   AST 15 - 41 U/L 29  28  29    ALT 0 - 44 U/L 16  14  24     12/2021 cologuard negative 05/2020 cologuard negative   Assessment: Encounter Diagnoses  Name Primary?   Esophageal dysphagia Yes   Dry cough    Pernicious anemia    Hypogammaglobulinemia    Waldenstrom macroglobulinemia (HCC)    68 year old female patient that presents with nonproductive cough and eso dysphagia. Evaluated and  cleared by pulmonology who recommended she see Dr. Charlanne. Will go ahead and scheduled EGD with possible dilatation to r/o stricture, web, or Barrett's esophagus.   #1 Dysphagia #2 Dry cough #3 Waldenstrm's macroglobulinemia #4 Acquired hypogammaglobulinemia - recurrent sinusitis -followed by hematology/oncology #5 Pernicious Anemia -receives B 12 injections #6 colon screening , neg cologuard 10/23 -due cologuard 10/26  Plan: - GERD diet, no late meals -Dysphagia precautions -would like to hold off on PPI therapy  -Schedule EGD with possible dilatation in [facility] with Dr. Charlanne. The risks and benefits of EGD with possible biopsies and esophageal dilation were discussed with the patient who agrees to proceed. Obtain pulmonology clearance due to moderate restrictive lung disorder per patient request.  Thank you for the courtesy of this consult. Please call me with any questions or concerns.   Kristy Brake, FNP-C [facility] 03/10/2024, 10:14 AM  Cc: Kristy Peter, PA-C  "

## 2024-03-10 NOTE — Patient Instructions (Addendum)
 GERD Recommend GERD diet   Dysphagia Precautions given     You have been scheduled for an endoscopy. Please follow written instructions given to you at your visit today.  If you use inhalers (even only as needed), please bring them with you on the day of your procedure.  If you take any of the following medications, they will need to be adjusted prior to your procedure:   DO NOT TAKE 7 DAYS PRIOR TO TEST- Trulicity (dulaglutide) Ozempic, Wegovy (semaglutide) Mounjaro, Zepbound (tirzepatide) Bydureon Bcise (exanatide extended release)  DO NOT TAKE 1 DAY PRIOR TO YOUR TEST Rybelsus (semaglutide) Adlyxin (lixisenatide) Victoza (liraglutide) Byetta (exanatide) ___________________________________________________________________________  Due to recent changes in healthcare laws, you may see the results of your imaging and laboratory studies on MyChart before your provider has had a chance to review them.  We understand that in some cases there may be results that are confusing or concerning to you. Not all laboratory results come back in the same time frame and the provider may be waiting for multiple results in order to interpret others.  Please give us  48 hours in order for your provider to thoroughly review all the results before contacting the office for clarification of your results.   _______________________________________________________  If your blood pressure at your visit was 140/90 or greater, please contact your primary care physician to follow up on this.  _______________________________________________________  If you are age 66 or older, your body mass index should be between 23-30. Your Body mass index is 19.73 kg/m. If this is out of the aforementioned range listed, please consider follow up with your Primary Care Provider.  If you are age 66 or younger, your body mass index should be between 19-25. Your Body mass index is 19.73 kg/m. If this is out of the  aformentioned range listed, please consider follow up with your Primary Care Provider.   ________________________________________________________  The [facility] providers would like to encourage you to use MYCHART to communicate with providers for non-urgent requests or questions.  Due to long hold times on the telephone, sending your provider a message by [facility] may be a faster and more efficient way to get a response.  Please allow 48 business hours for a response.  Please remember that this is for non-urgent requests.  _______________________________________________________  [facility] is using a team-based approach to care.  Your team is made up of your doctor and two to three APPS. Our APPS (Nurse Practitioners and Physician Assistants) work with your physician to ensure care continuity for you. They are fully qualified to address your health concerns and develop a treatment plan. They communicate directly with your gastroenterologist to care for you. Seeing the Advanced Practice Practitioners on your physician's team can help you by facilitating care more promptly, often allowing for earlier appointments, access to diagnostic testing, procedures, and other specialty referrals.   Thank you for trusting me with your gastrointestinal care. Deanna May, FNP-C

## 2024-03-10 NOTE — Telephone Encounter (Signed)
 error

## 2024-03-17 ENCOUNTER — Other Ambulatory Visit (HOSPITAL_COMMUNITY): Payer: Self-pay

## 2024-03-19 ENCOUNTER — Other Ambulatory Visit: Payer: Self-pay

## 2024-03-21 ENCOUNTER — Other Ambulatory Visit: Payer: Self-pay

## 2024-03-21 NOTE — Progress Notes (Signed)
 Specialty Pharmacy Refill Coordination Note  Kristy [name] is a 69 y.o. female contacted today regarding refills of specialty medication(s) Ibrutinib  (IMBRUVICA )   Patient requested Delivery   Delivery date: 03/25/24   Verified address: [address] [place] [address]   Medication will be filled on: 03/24/24

## 2024-03-24 ENCOUNTER — Other Ambulatory Visit: Payer: Self-pay

## 2024-03-25 ENCOUNTER — Encounter: Payer: Self-pay | Admitting: Hematology & Oncology

## 2024-03-25 ENCOUNTER — Other Ambulatory Visit: Payer: Self-pay

## 2024-03-25 ENCOUNTER — Inpatient Hospital Stay: Admitting: Hematology & Oncology

## 2024-03-25 ENCOUNTER — Inpatient Hospital Stay: Attending: Hematology & Oncology

## 2024-03-25 ENCOUNTER — Inpatient Hospital Stay

## 2024-03-25 VITALS — BP 110/64 | HR 79 | Resp 18

## 2024-03-25 VITALS — BP 135/58 | HR 90 | Temp 98.2°F | Resp 16 | Ht 60.0 in | Wt 100.0 lb

## 2024-03-25 DIAGNOSIS — D801 Nonfamilial hypogammaglobulinemia: Secondary | ICD-10-CM | POA: Diagnosis present

## 2024-03-25 DIAGNOSIS — C8800 Waldenstrom macroglobulinemia not having achieved remission: Secondary | ICD-10-CM

## 2024-03-25 DIAGNOSIS — D510 Vitamin B12 deficiency anemia due to intrinsic factor deficiency: Secondary | ICD-10-CM

## 2024-03-25 DIAGNOSIS — D649 Anemia, unspecified: Secondary | ICD-10-CM | POA: Insufficient documentation

## 2024-03-25 DIAGNOSIS — J0101 Acute recurrent maxillary sinusitis: Secondary | ICD-10-CM

## 2024-03-25 LAB — CBC WITH DIFFERENTIAL (CANCER CENTER ONLY)
Abs Immature Granulocytes: 0.07 K/uL (ref 0.00–0.07)
Basophils Absolute: 0 K/uL (ref 0.0–0.1)
Basophils Relative: 1 %
Eosinophils Absolute: 0 K/uL (ref 0.0–0.5)
Eosinophils Relative: 1 %
HCT: 42.9 % (ref 36.0–46.0)
Hemoglobin: 14.8 g/dL (ref 12.0–15.0)
Immature Granulocytes: 1 %
Lymphocytes Relative: 18 %
Lymphs Abs: 1.5 K/uL (ref 0.7–4.0)
MCH: 35.8 pg — ABNORMAL HIGH (ref 26.0–34.0)
MCHC: 34.5 g/dL (ref 30.0–36.0)
MCV: 103.9 fL — ABNORMAL HIGH (ref 80.0–100.0)
Monocytes Absolute: 0.5 K/uL (ref 0.1–1.0)
Monocytes Relative: 6 %
Neutro Abs: 6.2 K/uL (ref 1.7–7.7)
Neutrophils Relative %: 73 %
Platelet Count: 127 K/uL — ABNORMAL LOW (ref 150–400)
RBC: 4.13 MIL/uL (ref 3.87–5.11)
RDW: 12.8 % (ref 11.5–15.5)
WBC Count: 8.4 K/uL (ref 4.0–10.5)
nRBC: 0 % (ref 0.0–0.2)

## 2024-03-25 LAB — VITAMIN B12: Vitamin B-12: 511 pg/mL (ref 180–914)

## 2024-03-25 LAB — CMP (CANCER CENTER ONLY)
ALT: 28 U/L (ref 0–44)
AST: 31 U/L (ref 15–41)
Albumin: 4.5 g/dL (ref 3.5–5.0)
Alkaline Phosphatase: 66 U/L (ref 38–126)
Anion gap: 11 (ref 5–15)
BUN: 10 mg/dL (ref 8–23)
CO2: 28 mmol/L (ref 22–32)
Calcium: 9 mg/dL (ref 8.9–10.3)
Chloride: 102 mmol/L (ref 98–111)
Creatinine: 0.72 mg/dL (ref 0.44–1.00)
GFR, Estimated: >60 mL/min
Glucose, Bld: 86 mg/dL (ref 70–99)
Potassium: 4.2 mmol/L (ref 3.5–5.1)
Sodium: 140 mmol/L (ref 135–145)
Total Bilirubin: 1.2 mg/dL (ref 0.0–1.2)
Total Protein: 6.3 g/dL — ABNORMAL LOW (ref 6.5–8.1)

## 2024-03-25 MED ORDER — ACETAMINOPHEN 325 MG PO TABS: 650.0000 mg | ORAL_TABLET | Freq: Once | ORAL | Status: DC

## 2024-03-25 MED ORDER — DIPHENHYDRAMINE HCL 25 MG PO CAPS: 25.0000 mg | ORAL_CAPSULE | Freq: Once | ORAL | Status: DC

## 2024-03-25 MED ORDER — DEXTROSE 5 % IV SOLN: INTRAVENOUS | Status: DC

## 2024-03-25 MED ORDER — IMMUNE GLOBULIN (HUMAN) 20 GM/200ML IV SOLN
40.0000 g | Freq: Once | INTRAVENOUS | Status: AC
  Administered 2024-03-25: 40 g via INTRAVENOUS
  Filled 2024-03-25: qty 400

## 2024-03-25 NOTE — Patient Instructions (Signed)
 Immune Globulin  Injection What is this medication? IMMUNE GLOBULIN  (im MUNE GLOB yoo lin) treats many immune system conditions. It works by Designer, multimedia extra antibodies. Antibodies are proteins made by the immune system that help protect the body. This medicine may be used for other purposes; ask your health care provider or pharmacist if you have questions. COMMON BRAND NAME(S): ASCENIV, Baygam, BIVIGAM, Carimune, Carimune NF, cutaquig, Cuvitru, Flebogamma, Flebogamma DIF, GamaSTAN, GamaSTAN S/D, Gamimune N, Gammagard, Gammagard S/D, Gammaked, Gammaplex, Gammar-P IV, Gamunex, Gamunex-C, Hizentra, Iveegam, Iveegam EN, Octagam, Panglobulin, Panglobulin NF, panzyga, Polygam S/D, Privigen , Sandoglobulin, Venoglobulin-S, Vigam, Vivaglobulin, Xembify What should I tell my care team before I take this medication? They need to know if you have any of these conditions: Blood clotting disorder Condition where you have excess fluid in your body, such as heart failure or edema Dehydration Diabetes Have had blood clots Heart disease Immune system conditions Kidney disease Low levels of IgA Recent or upcoming vaccine An unusual or allergic reaction to immune globulin , other medications, foods, dyes, or preservatives Pregnant or trying to get pregnant Breastfeeding How should I use this medication? This medication is infused into a vein or under the skin. It may also be injected into a muscle. It is usually given by your care team in a hospital or clinic setting. It may also be given at home. If you get this medication at home, you will be taught how to prepare and give it. Take it as directed on the prescription label. Keep taking it unless your care team tells you to stop. It is important that you put your used needles and syringes in a special sharps container. Do not put them in a trash can. If you do not have a sharps container, call your pharmacist or care team to get one. Talk to your care team  about the use of this medication in children. While it may be given to children for selected conditions, precautions do apply. Overdosage: If you think you have taken too much of this medicine contact a poison control center or emergency room at once. NOTE: This medicine is only for you. Do not share this medicine with others. What if I miss a dose? If you get this medication at the hospital or clinic: It is important not to miss your dose. Call your care team if you are unable to keep an appointment. If you give yourself this medication at home: If you miss a dose, take it as soon as you can. Then continue your normal schedule. If it is almost time for your next dose, take only that dose. Do not take double or extra doses. Call your care team with questions. What may interact with this medication? Live virus vaccines This list may not describe all possible interactions. Give your health care provider a list of all the medicines, herbs, non-prescription drugs, or dietary supplements you use. Also tell them if you smoke, drink alcohol , or use illegal drugs. Some items may interact with your medicine. What should I watch for while using this medication? Your condition will be monitored carefully while you are receiving this medication. Tell your care team if your symptoms do not start to get better or if they get worse. You may need blood work done while you are taking this medication. This medication increases the risk of blood clots. People with heart, blood vessel, or blood clotting conditions are more likely to develop a blood clot. Other risk factors include advanced age, estrogen use, tobacco  use, lack of movement, and being overweight. This medication can decrease the response to a vaccine. If you need to get vaccinated, tell your care team if you have received this medication within the last year. Extra booster doses may be needed. Talk to your care team to see if a different vaccination schedule  is needed. This medication is made from donated human blood. There is a small risk it may contain bacteria or viruses, such as hepatitis or HIV. All products are processed to kill most bacteria and viruses. Talk to your care team if you have questions about the risk of infection. If you have diabetes, talk to your care team about which device you should use to check your blood sugar. This medication may cause some devices to report falsely high blood sugar levels. This may cause you to react by not treating a low blood sugar level or by giving an insulin  dose that was not needed. This can cause severe low blood sugar levels. What side effects may I notice from receiving this medication? Side effects that you should report to your care team as soon as possible: Allergic reactions--skin rash, itching, hives, swelling of the face, lips, tongue, or throat Blood clot--pain, swelling, or warmth in the leg, shortness of breath, chest pain Fever, neck pain or stiffness, sensitivity to light, headache, nausea, vomiting, confusion, which may be signs of meningitis Hemolytic anemia--unusual weakness or fatigue, dizziness, headache, trouble breathing, dark urine, yellowing skin or eyes Kidney injury--decrease in the amount of urine, swelling of the ankles, hands, or feet Low sodium level--muscle weakness, fatigue, dizziness, headache, confusion Shortness of breath or trouble breathing, cough, unusual weakness or fatigue, blue skin or lips Side effects that usually do not require medical attention (report these to your care team if they continue or are bothersome): Chills Diarrhea Fever Headache Nausea This list may not describe all possible side effects. Call your doctor for medical advice about side effects. You may report side effects to FDA at 1-800-FDA-1088. Where should I keep my medication? Keep out of the reach of children and pets. You will be instructed on how to store this medication. Get rid of  any unused medication after the expiration date. To get rid of medications that are no longer needed or have expired: Take the medication to a medication take-back program. Check with your pharmacy or law enforcement to find a location. If you cannot return the medication, ask your pharmacist or care team how to get rid of this medication safely. NOTE: This sheet is a summary. It may not cover all possible information. If you have questions about this medicine, talk to your doctor, pharmacist, or health care provider.  2025 Elsevier/Gold Standard (2023-05-21 00:00:00)

## 2024-03-25 NOTE — Progress Notes (Signed)
 " Hematology and Oncology Follow Up Visit  Kristy [name] [id number] Oct 19, 1955 69 y.o. 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  She had a very nice holiday season.  There was little bit of drama.  A new grandson was born.  However, he has some difficulties at birth.  Thankfully, he got over all of that.  He looks very healthy right now.  I am very happy that he is doing well and that his mom is also doing well.  As such, Kristy [name] is quite happy about this.  Otherwise, she is doing okay.  I think she is going to have an upper GI in February.  I do not see a problem with her having this.  When we last saw her, there was no monoclonal spike in her blood.  Her IgM level was 76 mg/dL.  She has had no issues with nausea or vomiting.  There is been no change in bowel or bladder habits.  She has had no rashes.  Her last vitamin B12 level was 1800 pg/dL.  Her sinuses have been doing fairly well.  Overall, I would say that her performance status is probably ECOG 0.   Medications:  Allergies as of 03/25/2024       Reactions   Grapefruit Extract Other (See Comments)   Can not take due to Imbruvica . grapefruit extract   Ibuprofen Other (See Comments)   Can not take due to Imbruvica  ibuprofen   Nitrofurantoin Other (See Comments)        Medication List        Accurate as of March 25, 2024  9:07 AM. If you have any questions, ask your nurse or doctor.          amLODipine  5 MG tablet Commonly known as: NORVASC  Take 5 mg by mouth daily.   BD Eclipse Syringe/Needle 23G X 1 3 ML Misc Generic drug: SYRINGE-NEEDLE (DISP) 3 ML Inject 1,000 mcg into the muscle every  30 (thirty) days.   Cholecalciferol 25 MCG (1000 UT) tablet Take 1,000 Units by mouth daily.   ciclopirox  8 % solution Commonly known as: PENLAC  Apply topically at bedtime. Apply over nail and surrounding skin. Apply daily over previous coat. After seven (7) days, may remove with alcohol and continue cycle.   cyanocobalamin  1000 MCG/ML injection Commonly known as: VITAMIN B12 Inject 1 mL (1,000 mcg total) into the muscle every 30 (thirty) days.   cyanocobalamin  1000 MCG/ML injection Commonly known as: VITAMIN B12 Inject 1 mL (1,000 mcg total) into the muscle every 30 (thirty) days.   estradiol  0.1 MG/GM vaginal cream Commonly known as: ESTRACE  Place 1 Applicatorful vaginally 3 (three) times a week.   fluticasone  50 MCG/ACT nasal spray Commonly known as: FLONASE  Place 1 spray into both nostrils daily.   Imbruvica  420 MG tablet Generic drug: ibrutinib  TAKE 1 TABLET BY MOUTH DAILY AFTER BREAKFAST.   Lifitegrast 5 % Soln Place 1 drop into both eyes 2 (two) times daily.   nitrofurantoin (macrocrystal-monohydrate) 100 MG capsule Commonly known as: MACROBID Take 100 mg by mouth daily as needed (For UTI).   triamcinolone cream 0.5 % Commonly known as: KENALOG  Apply 1 Application topically 2 (two) times daily.        Allergies:  Allergies  Allergen Reactions   Grapefruit Extract Other (See Comments)    Can not take due to Imbruvica .  grapefruit extract   Ibuprofen Other (See Comments)    Can not take due to Imbruvica   ibuprofen   Nitrofurantoin Other (See Comments)    Past Medical History, Surgical history, Social history, and Family History were reviewed and updated.  Review of Systems:  Review of Systems  Constitutional: Negative.   HENT: Negative.    Eyes: Negative.   Respiratory: Negative.    Cardiovascular: Negative.   Gastrointestinal: Negative.   Genitourinary: Negative.   Musculoskeletal: Negative.   Skin: Negative.   Neurological: Negative.    Endo/Heme/Allergies: Negative.   Psychiatric/Behavioral: Negative.        Physical Exam:  height is 5' (1.524 m) and weight is 100 lb (45.4 kg). Her oral temperature is 98.2 F (36.8 C). Her blood pressure is 135/58 (abnormal) and her pulse is 90. Her respiration is 16 and oxygen saturation is 97%.   Wt Readings from Last 3 Encounters:  03/25/24 100 lb (45.4 kg)  03/10/24 101 lb (45.8 kg)  02/11/24 98 lb (44.5 kg)    Physical Exam Vitals reviewed.  HENT:     Head: Normocephalic and atraumatic.  Eyes:     Pupils: Pupils are equal, round, and reactive to light.  Cardiovascular:     Rate and Rhythm: Normal rate and regular rhythm.     Heart sounds: Normal heart sounds.  Pulmonary:     Effort: Pulmonary effort is normal.     Breath sounds: Normal breath sounds.  Abdominal:     General: Bowel sounds are normal.     Palpations: Abdomen is soft.  Musculoskeletal:        General: No tenderness or deformity. Normal range of motion.     Cervical back: Normal range of motion.  Lymphadenopathy:     Cervical: No cervical adenopathy.  Skin:    General: Skin is warm and dry.     Findings: No erythema or rash.  Neurological:     Mental Status: She is alert and oriented to person, place, and time.  Psychiatric:        Behavior: Behavior normal.        Thought Content: Thought content normal.        Judgment: Judgment normal.      Lab Results  Component Value Date   WBC 8.4 03/25/2024   HGB 14.8 03/25/2024   HCT 42.9 03/25/2024   MCV 103.9 (H) 03/25/2024   PLT 127 (L) 03/25/2024   No results found for: FERRITIN, IRON, TIBC, UIBC, IRONPCTSAT Lab Results  Component Value Date   RETICCTPCT 2.9 (H) 10/30/2014   RBC 4.13 03/25/2024   RETICCTABS 98.3 10/30/2014   Lab Results  Component Value Date   KPAFRELGTCHN 7.7 02/11/2024   LAMBDASER 2.5 (L) 02/11/2024   KAPLAMBRATIO 3.08 (H) 02/11/2024   Lab Results  Component Value Date   IGGSERUM 686 02/11/2024   IGA  10 (L) 02/11/2024   IGMSERUM 76 02/11/2024   Lab Results  Component Value Date   TOTALPROTELP 6.1 12/27/2023   ALBUMINELP 3.7 12/27/2023   A1GS 0.2 12/27/2023   A2GS 0.6 12/27/2023   BETS 0.9 12/27/2023   BETA2SER 0.2 02/15/2015   GAMS 0.7 12/27/2023   MSPIKE Not Observed 12/27/2023   SPEI Comment 01/04/2023     Chemistry  Component Value Date/Time   NA 140 02/11/2024 0837   NA 147 (H) 03/19/2017 0849   NA 139 03/16/2016 1337   K 4.1 02/11/2024 0837   K 4.5 03/19/2017 0849   K 3.8 03/16/2016 1337   CL 103 02/11/2024 0837   CL 108 03/19/2017 0849   CO2 24 02/11/2024 0837   CO2 31 03/19/2017 0849   CO2 27 03/16/2016 1337   BUN 7 (L) 02/11/2024 0837   BUN 11 03/19/2017 0849   BUN 5.2 (L) 03/16/2016 1337   CREATININE 0.76 02/11/2024 0837   CREATININE 0.68 12/27/2023 0824   CREATININE 1.0 03/19/2017 0849   CREATININE 0.7 03/16/2016 1337      Component Value Date/Time   CALCIUM 9.2 02/11/2024 0837   CALCIUM 9.6 03/19/2017 0849   CALCIUM 8.8 03/16/2016 1337   ALKPHOS 65 02/11/2024 0837   ALKPHOS 53 03/19/2017 0849   ALKPHOS 62 03/16/2016 1337   AST 29 02/11/2024 0837   AST 28 12/27/2023 0824   AST 24 03/16/2016 1337   ALT 16 02/11/2024 0837   ALT 14 12/27/2023 0824   ALT 23 03/19/2017 0849   ALT 19 03/16/2016 1337   BILITOT 1.3 (H) 02/11/2024 0837   BILITOT 1.2 12/27/2023 0824   BILITOT 0.57 03/16/2016 1337       Impression and Plan: Ms. [name] is a very pleasant 69 yo caucasian female with Waldenstrom's with acquired hypogammaglobulinemia and recurrent sinusitis.  Her Waldenstrom's continues to be under excellent control.   She is doing well on the Imbruvica .   We will proceed with IVIG today as planned.   B 12 given at home now by her husband.   Follow-up in 6 weeks.   Kristy JONELLE Crease, MD 1/6/20269:07 AM  "

## 2024-03-25 NOTE — Progress Notes (Signed)
 Patient does not want to stay for the 30 minute recommended post IVIG observation. VSS. Patient discharged ambulatory without complaints or concerns.

## 2024-03-26 LAB — IGG, IGA, IGM
IgA: 9 mg/dL — ABNORMAL LOW (ref 87–352)
IgG (Immunoglobin G), Serum: 589 mg/dL (ref 586–1602)
IgM (Immunoglobulin M), Srm: 59 mg/dL (ref 26–217)

## 2024-03-26 LAB — KAPPA/LAMBDA LIGHT CHAINS
Kappa free light chain: 5.4 mg/L (ref 3.3–19.4)
Kappa, lambda light chain ratio: 2.35 — ABNORMAL HIGH (ref 0.26–1.65)
Lambda free light chains: 2.3 mg/L — ABNORMAL LOW (ref 5.7–26.3)

## 2024-03-27 ENCOUNTER — Ambulatory Visit: Admitting: Podiatry

## 2024-03-27 ENCOUNTER — Encounter: Payer: Self-pay | Admitting: Podiatry

## 2024-03-27 DIAGNOSIS — B351 Tinea unguium: Secondary | ICD-10-CM

## 2024-03-27 LAB — PROTEIN ELECTROPHORESIS, SERUM
A/G Ratio: 1.6 (ref 0.7–1.7)
Albumin ELP: 3.7 g/dL (ref 2.9–4.4)
Alpha-1-Globulin: 0.3 g/dL (ref 0.0–0.4)
Alpha-2-Globulin: 0.6 g/dL (ref 0.4–1.0)
Beta Globulin: 0.9 g/dL (ref 0.7–1.3)
Gamma Globulin: 0.5 g/dL (ref 0.4–1.8)
Globulin, Total: 2.3 g/dL (ref 2.2–3.9)
Total Protein ELP: 6 g/dL (ref 6.0–8.5)

## 2024-03-27 NOTE — Progress Notes (Signed)
"  °  Subjective:  Patient ID: Kristy [name], female    DOB: [birth year],   MRN: [id number]  Chief Complaint  Patient presents with   Nail Problem    She's going to take a look at this big toe.  The toenail came off some.    69 y.o. female presents for follow up of fungal nails.  She relates the big toe has been doing well but recently part of the nail did fall off and wanted to have this checked out.  She has continued to use the Jublia.   . Denies any other pedal complaints. Denies n/v/f/c.   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 CFT <3 seconds. Normal hair growth on digits. No edema.  Skin. No lacerations or abrasions bilateral feet.  Right hallux nail thickened dystrophic.  Skin changes noted to the right 3rd and 5th digit nails as well. Musculoskeletal: MMT 5/5 bilateral lower extremities in DF, PF, Inversion and Eversion. Deceased ROM in DF of ankle joint.  Neurological: Sensation intact to light touch.   Assessment:   1. Onychomycosis        Plan:  Patient was evaluated and treated and all questions answered. -Examined patient -Discussed treatment options for painful dystrophic nails  -C culture positive for fungus saprophytic mold.  Trauma also present. -Discussed fungal nail treatment options including oral, topical, and laser treatments.  -Continue topical for now.  Discussed likely will take 6 months -Patient to return in 6 months   Asberry Failing, DPM    "

## 2024-04-09 ENCOUNTER — Other Ambulatory Visit: Payer: Self-pay

## 2024-04-11 ENCOUNTER — Other Ambulatory Visit (HOSPITAL_COMMUNITY): Payer: Self-pay

## 2024-04-11 ENCOUNTER — Other Ambulatory Visit: Payer: Self-pay

## 2024-04-11 NOTE — Progress Notes (Signed)
 Specialty Pharmacy Refill Coordination Note  Spoke with Wateen Varon is a 69 y.o. female contacted today regarding refills of specialty medication(s) Ibrutinib  (IMBRUVICA )  Doses on hand: 14  Patient requested: Delivery   Delivery date: 04/21/24   Verified address: [address] [place] [address]  Medication will be filled on 04/18/24

## 2024-04-17 ENCOUNTER — Other Ambulatory Visit: Payer: Self-pay

## 2024-04-17 NOTE — Progress Notes (Signed)
 Patient was contacted by the pharmacy regarding their specialty medication(s) Ibrutinib  (IMBRUVICA )  to reschedule an earlier delivery date, due to impending winter weather conditions. Medication(s) will be filled 04/17/24 for a delivery by 04/18/24

## 2024-04-21 ENCOUNTER — Encounter: Payer: Self-pay | Admitting: Gastroenterology

## 2024-04-23 ENCOUNTER — Other Ambulatory Visit: Payer: Self-pay

## 2024-04-23 NOTE — Progress Notes (Signed)
 Specialty Pharmacy Ongoing Clinical Assessment Note  Kristy [name] is a 69 y.o. female who is being followed by the specialty pharmacy service for RxSp Oncology   Patient's specialty medication(s) reviewed today: Ibrutinib  (IMBRUVICA )   Missed doses in the last 4 weeks: 0   Patient/Caregiver did not have any additional questions or concerns.   Therapeutic benefit summary: Patient is achieving benefit   Adverse events/side effects summary: No adverse events/side effects   Patient's therapy is appropriate to: Continue    Goals Addressed             This Visit's Progress    Stabilization of disease   On track    Patient is on track. Patient will maintain adherence.  Per office visit notes from 03/25/24, her Waldenstrom's remains under excellent control.          Follow up: 6 months  [facility]

## 2024-04-25 ENCOUNTER — Telehealth: Payer: Self-pay | Admitting: Gastroenterology

## 2024-04-25 NOTE — Telephone Encounter (Signed)
 Pulmonary clearance Patient seen in office on 04/04/2024 by [facility], [facility] Pulmonary risk stratification is low for EGD. They did discuss possible risk involved. Reviewed entire note  Jennifr Gaeta, NP

## 2024-04-28 ENCOUNTER — Encounter: Admitting: Gastroenterology

## 2024-05-06 ENCOUNTER — Inpatient Hospital Stay

## 2024-05-06 ENCOUNTER — Inpatient Hospital Stay: Admitting: Hematology & Oncology

## 2024-09-25 ENCOUNTER — Ambulatory Visit: Admitting: Podiatry
